# Patient Record
Sex: Male | Born: 1945
Health system: Southern US, Community
[De-identification: ages and names within clinical notes are randomized; demographics above are authoritative.]

## PROBLEM LIST (undated history)

## (undated) DIAGNOSIS — Z972 Presence of dental prosthetic device (complete) (partial): Secondary | ICD-10-CM

## (undated) DIAGNOSIS — G4730 Sleep apnea, unspecified: Secondary | ICD-10-CM

## (undated) DIAGNOSIS — K2210 Ulcer of esophagus without bleeding: Secondary | ICD-10-CM

## (undated) DIAGNOSIS — E119 Type 2 diabetes mellitus without complications: Secondary | ICD-10-CM

## (undated) DIAGNOSIS — I10 Essential (primary) hypertension: Secondary | ICD-10-CM

## (undated) HISTORY — DX: Essential (primary) hypertension: I10

## (undated) HISTORY — PX: TONSILLECTOMY: SUR1361

## (undated) HISTORY — DX: Ulcer of esophagus without bleeding: K22.10

---

## 2009-02-22 ENCOUNTER — Ambulatory Visit: Payer: Self-pay | Admitting: Internal Medicine

## 2009-04-14 ENCOUNTER — Ambulatory Visit: Payer: Self-pay | Admitting: Gastroenterology

## 2009-08-05 ENCOUNTER — Inpatient Hospital Stay: Payer: Self-pay | Admitting: Internal Medicine

## 2010-08-24 ENCOUNTER — Emergency Department: Payer: Self-pay | Admitting: Emergency Medicine

## 2010-08-30 ENCOUNTER — Telehealth: Payer: Self-pay | Admitting: Internal Medicine
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
## 2010-08-31 NOTE — Telephone Encounter (Signed)
appt 09/01/10 ok per dr walker

## 2010-09-01 ENCOUNTER — Ambulatory Visit: Payer: Self-pay | Admitting: Internal Medicine

## 2010-10-13 ENCOUNTER — Ambulatory Visit (INDEPENDENT_AMBULATORY_CARE_PROVIDER_SITE_OTHER): Payer: Medicare Other | Admitting: Internal Medicine

## 2010-10-13 ENCOUNTER — Encounter: Payer: Self-pay | Admitting: Internal Medicine

## 2010-10-13 VITALS — BP 142/100 | HR 68 | Temp 98.9°F | Resp 16 | Ht 65.5 in | Wt 202.2 lb

## 2010-10-13 DIAGNOSIS — M109 Gout, unspecified: Secondary | ICD-10-CM

## 2010-10-13 DIAGNOSIS — I10 Essential (primary) hypertension: Secondary | ICD-10-CM

## 2010-10-13 DIAGNOSIS — Z23 Encounter for immunization: Secondary | ICD-10-CM

## 2010-10-13 DIAGNOSIS — M549 Dorsalgia, unspecified: Secondary | ICD-10-CM

## 2010-10-13 LAB — COMPREHENSIVE METABOLIC PANEL
BUN: 14 mg/dL (ref 6–23)
CO2: 24 mEq/L (ref 19–32)
Calcium: 9.2 mg/dL (ref 8.4–10.5)
Chloride: 106 mEq/L (ref 96–112)
Creatinine, Ser: 1.2 mg/dL (ref 0.4–1.5)
GFR: 62.87 mL/min (ref >60.00)
Glucose, Bld: 165 mg/dL — ABNORMAL HIGH (ref 70–99)
Total Bilirubin: 0.7 mg/dL (ref 0.3–1.2)

## 2010-10-13 LAB — URIC ACID: Uric Acid, Serum: 4.7 mg/dL (ref 4.0–7.8)

## 2010-10-13 MED ORDER — ALLOPURINOL 100 MG PO TABS: 100.0000 mg | ORAL_TABLET | Freq: Every day | ORAL | Status: DC

## 2010-10-13 MED ORDER — PREDNISONE 10 MG PO TABS: 10.0000 mg | ORAL_TABLET | Freq: Every day | ORAL | Status: DC
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
## 2010-10-28 ENCOUNTER — Other Ambulatory Visit: Payer: Self-pay | Admitting: Internal Medicine

## 2010-11-03 ENCOUNTER — Telehealth: Payer: Self-pay | Admitting: Internal Medicine

## 2010-11-03 NOTE — Telephone Encounter (Signed)
PT CALLED TO CHECK ON RX  FOR PREDIZONE THAT WAS PRESCRIBE THE LAST VISITHE WAS HERE. THIS  HAS NOT REACHED [facility] ([facility]) PT HAS CALLED [facility] TODAY THEY SAY THEY DO NOT HAVE RX PLEASE ADVISE PT WHEN THIS IS CALLED IN

## 2010-11-04 MED ORDER — PREDNISONE 10 MG PO TABS: 10.0000 mg | ORAL_TABLET | Freq: Every day | ORAL | Status: AC

## 2010-11-04 NOTE — Telephone Encounter (Signed)
Rx called in    Patient informed.

## 2010-11-17 ENCOUNTER — Encounter: Payer: Self-pay | Admitting: Internal Medicine

## 2010-11-17 ENCOUNTER — Ambulatory Visit (INDEPENDENT_AMBULATORY_CARE_PROVIDER_SITE_OTHER): Payer: Medicare Other | Admitting: Internal Medicine

## 2010-11-17 DIAGNOSIS — M549 Dorsalgia, unspecified: Secondary | ICD-10-CM

## 2010-11-17 DIAGNOSIS — R7989 Other specified abnormal findings of blood chemistry: Secondary | ICD-10-CM

## 2010-11-17 DIAGNOSIS — I10 Essential (primary) hypertension: Secondary | ICD-10-CM

## 2010-11-17 LAB — COMPREHENSIVE METABOLIC PANEL
AST: 33 U/L (ref 0–37)
Albumin: 4 g/dL (ref 3.5–5.2)
BUN: 17 mg/dL (ref 6–23)
Calcium: 9.2 mg/dL (ref 8.4–10.5)
Chloride: 106 mEq/L (ref 96–112)
Glucose, Bld: 142 mg/dL — ABNORMAL HIGH (ref 70–99)
Potassium: 3.6 mEq/L (ref 3.5–5.1)

## 2010-11-17 NOTE — Progress Notes (Signed)
Subjective:    Patient ID: Shane Jones, male    DOB: Feb 09, 1945, 65 y.o.   MRN: [id number]  HPI 65YO male with HTN, gout, chronic low back pain presents for follow up. Reports BP well controlled on current meds. BP typically 130/80 or less at home. No chest pain, palpitations, headache, visual changes. Pt reports back pain improved with massage therapy. Has been much more active, started new job. Reports using medication on occasion for back pain, which was prescribed by [facility], but cannot remember name of med. No LE weakness, loss continence bowel or bladder.  Outpatient Encounter Prescriptions as of 11/17/2010  Medication Sig Dispense Refill  . allopurinol (ZYLOPRIM) 100 MG tablet Take 1 tablet (100 mg total) by mouth daily.  30 tablet  3  . amLODipine (NORVASC) 10 MG tablet Take 1 tablet by mouth Daily.      Marland Kitchen losartan (COZAAR) 100 MG tablet Take 1 tablet by mouth Daily.      . metoprolol (TOPROL-XL) 100 MG 24 hr tablet Take 100 mg by mouth daily.        . predniSONE (DELTASONE) 10 MG tablet Take 10 mg by mouth daily. Use only for severe gout attack         Review of Systems  Constitutional: Negative for fever, chills, activity change, appetite change, fatigue and unexpected weight change.  Eyes: Negative for visual disturbance.  Respiratory: Negative for cough and shortness of breath.   Cardiovascular: Negative for chest pain, palpitations and leg swelling.  Gastrointestinal: Negative for abdominal pain and abdominal distention.  Genitourinary: Negative for dysuria, urgency and difficulty urinating.  Musculoskeletal: Positive for myalgias and back pain. Negative for arthralgias and gait problem.  Skin: Negative for color change and rash.  Hematological: Negative for adenopathy.  Psychiatric/Behavioral: Negative for sleep disturbance and dysphoric mood. The patient is not nervous/anxious.    BP 138/80  Pulse 66  Temp(Src) 98.7 F (37.1 C) (Oral)  Wt 203 lb (92.08 kg)  SpO2 97%       Objective:   Physical Exam  Constitutional: He is oriented to person, place, and time. He appears well-developed and well-nourished. No distress.  HENT:  Head: Normocephalic and atraumatic.  Right Ear: External ear normal.  Left Ear: External ear normal.  Nose: Nose normal.  Mouth/Throat: Oropharynx is clear and moist. No oropharyngeal exudate.  Eyes: Conjunctivae and EOM are normal. Pupils are equal, round, and reactive to light. Right eye exhibits no discharge. Left eye exhibits no discharge. No scleral icterus.  Neck: Normal range of motion. Neck supple. No tracheal deviation present. No thyromegaly present.  Cardiovascular: Normal rate, regular rhythm and normal heart sounds.  Exam reveals no gallop and no friction rub.   No murmur heard. Pulmonary/Chest: Effort normal and breath sounds normal. No respiratory distress. He has no wheezes. He has no rales. He exhibits no tenderness.  Musculoskeletal: Normal range of motion. He exhibits edema (+2 to mid lower leg).  Lymphadenopathy:    He has no cervical adenopathy.  Neurological: He is alert and oriented to person, place, and time. No cranial nerve deficit. Coordination normal.  Skin: Skin is warm and dry. No rash noted. He is not diaphoretic. No erythema. No pallor.  Psychiatric: He has a normal mood and affect. His behavior is normal. Judgment and thought content normal.          Assessment & Plan:  1. Hypertension - BP well controlled on current meds. Will continue. Follow up 3 months.  2. Chronic  back pain - Marked improvement with massage therapy.  Will continue.  3. Elevated LFTs - Noted on labs 1 month ago. Will recheck today.

## 2011-01-26 ENCOUNTER — Ambulatory Visit (INDEPENDENT_AMBULATORY_CARE_PROVIDER_SITE_OTHER): Payer: Medicare Other | Admitting: Internal Medicine

## 2011-01-26 ENCOUNTER — Encounter: Payer: Self-pay | Admitting: Internal Medicine

## 2011-01-26 VITALS — BP 120/70 | HR 71 | Temp 97.8°F | Ht 66.0 in | Wt 193.0 lb

## 2011-01-26 DIAGNOSIS — M109 Gout, unspecified: Secondary | ICD-10-CM

## 2011-01-26 MED ORDER — PREDNISONE (PAK) 10 MG PO TABS: ORAL_TABLET | ORAL | Status: AC

## 2011-01-26 MED ORDER — PREDNISONE (PAK) 10 MG PO TABS: ORAL_TABLET | ORAL | Status: DC

## 2011-01-26 MED ORDER — HYDROCODONE-ACETAMINOPHEN 5-500 MG PO TABS: 1.0000 | ORAL_TABLET | Freq: Three times a day (TID) | ORAL | Status: DC | PRN

## 2011-01-26 NOTE — Progress Notes (Signed)
  Subjective:    Patient ID: Shane Jones, male    DOB: November 13, 1945, 66 y.o.   MRN: [id number]  HPI 66 year old male with history of gouty arthropathy presents for an acute visit complaining of a one-week history of worsening pain over his left anterior knee. He has a history of extensive gouty arthropathy in both of his knees. Last year, he required hospitalization for pain control and management of large knee effusion. He has been taking allopurinol since his last hospitalization in his symptoms have been well-controlled. When his pain began to increase one week ago, he started taking prednisone. He has been taking it intermittently 1-2 tablets per day. He reports some improvement with this, however he continues to have pain in the evenings which is limiting his ability to sleep. He denies any fever or chills. He denies any other joint pain.  Outpatient Encounter Prescriptions as of 01/26/2011  Medication Sig Dispense Refill  . allopurinol (ZYLOPRIM) 100 MG tablet Take 1 tablet (100 mg total) by mouth daily.  30 tablet  3  . amLODipine (NORVASC) 10 MG tablet Take 1 tablet by mouth Daily.      Marland Kitchen losartan (COZAAR) 100 MG tablet Take 1 tablet by mouth Daily.      . metoprolol (TOPROL-XL) 100 MG 24 hr tablet Take 100 mg by mouth daily.        . predniSONE (DELTASONE) 10 MG tablet Take 10 mg by mouth daily. Use only for severe gout attack       . HYDROcodone-acetaminophen (VICODIN) 5-500 MG per tablet Take 1 tablet by mouth every 8 (eight) hours as needed for pain.  20 tablet  0  . predniSONE (STERAPRED UNI-PAK) 10 MG tablet Take 60mg  day 1 then taper by 10mg  daily  21 tablet  0  . DISCONTD: predniSONE (STERAPRED UNI-PAK) 10 MG tablet Take 60mg  day 1 then taper by 10mg  daily  21 tablet  0    Review of Systems  Constitutional: Negative for fever, chills and diaphoresis.  Respiratory: Negative for shortness of breath.   Cardiovascular: Positive for leg swelling. Negative for chest pain.  Musculoskeletal:  Positive for myalgias, joint swelling and arthralgias.  Skin: Positive for color change.   BP 120/70  Pulse 71  Temp(Src) 97.8 F (36.6 C) (Oral)  Ht 5\' 6"  (1.676 m)  Wt 193 lb (87.544 kg)  BMI 31.15 kg/m2  SpO2 95%     Objective:   Physical Exam  Constitutional: He appears well-developed and well-nourished. No distress.  HENT:  Head: Normocephalic and atraumatic.  Eyes: EOM are normal.  Neck: Normal range of motion. Neck supple.  Pulmonary/Chest: Effort normal.  Musculoskeletal:       Left knee: He exhibits swelling, erythema and bony tenderness.  Skin: He is not diaphoretic.          Assessment & Plan:

## 2011-01-26 NOTE — Assessment & Plan Note (Signed)
Patient with gout flare. He is on allopurinol. He has been using prednisone intermittently. We'll have him start a prednisone taper starting at 60 mg today and then tapering by 10 mg daily until gone. He will use hydrocodone as needed for severe pain. He will return to clinic in 2 weeks or sooner if symptoms are not improving.

## 2011-01-30 ENCOUNTER — Telehealth: Payer: Self-pay | Admitting: *Deleted

## 2011-01-30 NOTE — Telephone Encounter (Signed)
FYI - Pt left VM, just wanted you to know that he is doing much better

## 2011-02-08 ENCOUNTER — Telehealth: Payer: Self-pay | Admitting: *Deleted

## 2011-02-08 DIAGNOSIS — M109 Gout, unspecified: Secondary | ICD-10-CM

## 2011-02-08 NOTE — Telephone Encounter (Signed)
Patient requesting RF of hydroco/apap 5/500 for gout pain in knee.

## 2011-02-08 NOTE — Telephone Encounter (Signed)
Fine to refill 

## 2011-02-09 ENCOUNTER — Encounter: Payer: Self-pay | Admitting: Internal Medicine

## 2011-02-09 MED ORDER — HYDROCODONE-ACETAMINOPHEN 5-500 MG PO TABS: 1.0000 | ORAL_TABLET | Freq: Three times a day (TID) | ORAL | Status: DC | PRN

## 2011-02-09 NOTE — Telephone Encounter (Signed)
Called in, Patient informed  

## 2011-02-13 ENCOUNTER — Ambulatory Visit: Payer: Medicare Other | Admitting: Internal Medicine

## 2011-02-17 ENCOUNTER — Encounter: Payer: Self-pay | Admitting: Internal Medicine

## 2011-02-17 ENCOUNTER — Ambulatory Visit (INDEPENDENT_AMBULATORY_CARE_PROVIDER_SITE_OTHER): Payer: Medicare Other | Admitting: Internal Medicine

## 2011-02-17 DIAGNOSIS — E119 Type 2 diabetes mellitus without complications: Secondary | ICD-10-CM

## 2011-02-17 DIAGNOSIS — G629 Polyneuropathy, unspecified: Secondary | ICD-10-CM | POA: Insufficient documentation

## 2011-02-17 DIAGNOSIS — E039 Hypothyroidism, unspecified: Secondary | ICD-10-CM

## 2011-02-17 DIAGNOSIS — I10 Essential (primary) hypertension: Secondary | ICD-10-CM | POA: Diagnosis not present

## 2011-02-17 DIAGNOSIS — G589 Mononeuropathy, unspecified: Secondary | ICD-10-CM | POA: Diagnosis not present

## 2011-02-17 DIAGNOSIS — D510 Vitamin B12 deficiency anemia due to intrinsic factor deficiency: Secondary | ICD-10-CM | POA: Diagnosis not present

## 2011-02-17 DIAGNOSIS — M109 Gout, unspecified: Secondary | ICD-10-CM

## 2011-02-17 LAB — COMPREHENSIVE METABOLIC PANEL
ALT: 24 U/L (ref 0–53)
AST: 20 U/L (ref 0–37)
Calcium: 9.2 mg/dL (ref 8.4–10.5)
Chloride: 99 mEq/L (ref 96–112)
Creatinine, Ser: 1.1 mg/dL (ref 0.4–1.5)
Sodium: 135 mEq/L (ref 135–145)

## 2011-02-17 LAB — TSH: TSH: 0.84 u[IU]/mL (ref 0.35–5.50)

## 2011-02-17 MED ORDER — METOPROLOL SUCCINATE ER 100 MG PO TB24: 100.0000 mg | ORAL_TABLET | Freq: Every day | ORAL | Status: DC

## 2011-02-17 MED ORDER — ALLOPURINOL 100 MG PO TABS: 100.0000 mg | ORAL_TABLET | Freq: Every day | ORAL | Status: DC

## 2011-02-17 MED ORDER — AMLODIPINE BESYLATE 10 MG PO TABS: 10.0000 mg | ORAL_TABLET | Freq: Every day | ORAL | Status: DC

## 2011-02-17 NOTE — Progress Notes (Signed)
Subjective:    Patient ID: Shane Jones, male    DOB: 1945/12/20, 66 y.o.   MRN: [id number]  HPI 66 year old male with history of hypertension, gout presents for followup. He recently had a gout flare and completed a taper pack of prednisone. He reports resolution of his symptoms including joint pain after taking prednisone. He is currently taking allopurinol alone. He reports no further symptoms such as pain, redness, or joint stiffness.  In regards to his hypertension, he reports full compliance with his medications. He denies any chest pain, shortness of breath, palpitations. He has not regularly checked his blood pressure.  He is concerned today about some intermittent burning in his feet. Currently, he has not had any symptoms. However, when he was using prednisone he noted burning pain in both of his feet extending up his legs. He has never had a wound on his legs. He does not have a diagnosis of diabetes, but has had some mildly elevated sugars in the past. He does not have a history of thyroid dysfunction or B12 deficiency. He denies any weakness in his legs. He denies any low back pain.  Outpatient Encounter Prescriptions as of 02/17/2011  Medication Sig Dispense Refill  . allopurinol (ZYLOPRIM) 100 MG tablet Take 1 tablet (100 mg total) by mouth daily.  90 tablet  3  . amLODipine (NORVASC) 10 MG tablet Take 1 tablet (10 mg total) by mouth daily.  90 tablet  3  . HYDROcodone-acetaminophen (VICODIN) 5-500 MG per tablet Take 1 tablet by mouth every 8 (eight) hours as needed for pain.  20 tablet  0  . losartan (COZAAR) 100 MG tablet Take 1 tablet by mouth Daily.      . metoprolol succinate (TOPROL-XL) 100 MG 24 hr tablet Take 1 tablet (100 mg total) by mouth daily.  90 tablet  3  . predniSONE (DELTASONE) 10 MG tablet Take 10 mg by mouth daily. Use only for severe gout attack       . DISCONTD: allopurinol (ZYLOPRIM) 100 MG tablet Take 1 tablet (100 mg total) by mouth daily.  30 tablet  3  .  DISCONTD: allopurinol (ZYLOPRIM) 100 MG tablet Take 1 tablet (100 mg total) by mouth daily.  90 tablet  3  . DISCONTD: amLODipine (NORVASC) 10 MG tablet Take 1 tablet by mouth Daily.      Marland Kitchen DISCONTD: metoprolol (TOPROL-XL) 100 MG 24 hr tablet Take 100 mg by mouth daily.          Review of Systems  Constitutional: Negative for fever, chills, activity change, appetite change, fatigue and unexpected weight change.  Eyes: Negative for visual disturbance.  Respiratory: Negative for cough and shortness of breath.   Cardiovascular: Negative for chest pain, palpitations and leg swelling.  Gastrointestinal: Negative for abdominal pain and abdominal distention.  Genitourinary: Negative for dysuria, urgency and difficulty urinating.  Musculoskeletal: Positive for myalgias. Negative for joint swelling, arthralgias and gait problem.  Skin: Negative for color change and rash.  Neurological: Positive for numbness.  Hematological: Negative for adenopathy.  Psychiatric/Behavioral: Negative for sleep disturbance and dysphoric mood. The patient is not nervous/anxious.    BP 130/82  Pulse 88  Temp(Src) 98 F (36.7 C) (Oral)  Resp 12  Wt 188 lb (85.276 kg)  SpO2 98%     Objective:   Physical Exam  Constitutional: He is oriented to person, place, and time. He appears well-developed and well-nourished. No distress.  HENT:  Head: Normocephalic and atraumatic.  Right Ear: External ear  normal.  Left Ear: External ear normal.  Nose: Nose normal.  Mouth/Throat: Oropharynx is clear and moist. No oropharyngeal exudate.  Eyes: Conjunctivae and EOM are normal. Pupils are equal, round, and reactive to light. Right eye exhibits no discharge. Left eye exhibits no discharge. No scleral icterus.  Neck: Normal range of motion. Neck supple. No tracheal deviation present. No thyromegaly present.  Cardiovascular: Normal rate, regular rhythm and normal heart sounds.  Exam reveals no gallop and no friction rub.   No  murmur heard. Pulmonary/Chest: Effort normal and breath sounds normal. No respiratory distress. He has no wheezes. He has no rales. He exhibits no tenderness.  Musculoskeletal: Normal range of motion. He exhibits no edema.  Lymphadenopathy:    He has no cervical adenopathy.  Neurological: He is alert and oriented to person, place, and time. No cranial nerve deficit. Coordination normal.  Skin: Skin is warm and dry. No rash noted. He is not diaphoretic. No erythema. No pallor.  Psychiatric: He has a normal mood and affect. His behavior is normal. Judgment and thought content normal.          Assessment & Plan:

## 2011-02-17 NOTE — Assessment & Plan Note (Signed)
BP well controlled on current meds. Will continue. Renal function with labs today. Follow up in 6 months.

## 2011-02-17 NOTE — Assessment & Plan Note (Signed)
Currently asymptomatic. On allopurinol, will continue.  Pt will call if symptoms recur.

## 2011-02-17 NOTE — Assessment & Plan Note (Signed)
Currently asymptomatic, but recently with symptoms of burning lower extremities.  Will check BG, TSH, B12 with labs. Follow up prn and 6 months.

## 2011-02-20 ENCOUNTER — Ambulatory Visit (INDEPENDENT_AMBULATORY_CARE_PROVIDER_SITE_OTHER): Payer: Medicare Other | Admitting: Internal Medicine

## 2011-02-20 ENCOUNTER — Other Ambulatory Visit: Payer: Self-pay | Admitting: *Deleted

## 2011-02-20 ENCOUNTER — Encounter: Payer: Self-pay | Admitting: Internal Medicine

## 2011-02-20 VITALS — BP 122/74 | HR 67 | Temp 98.3°F | Ht 66.0 in | Wt 187.0 lb

## 2011-02-20 DIAGNOSIS — E1140 Type 2 diabetes mellitus with diabetic neuropathy, unspecified: Secondary | ICD-10-CM | POA: Insufficient documentation

## 2011-02-20 DIAGNOSIS — E1165 Type 2 diabetes mellitus with hyperglycemia: Secondary | ICD-10-CM

## 2011-02-20 DIAGNOSIS — E119 Type 2 diabetes mellitus without complications: Secondary | ICD-10-CM | POA: Insufficient documentation

## 2011-02-20 MED ORDER — GLIPIZIDE-METFORMIN HCL 2.5-500 MG PO TABS: 1.0000 | ORAL_TABLET | Freq: Two times a day (BID) | ORAL | Status: DC

## 2011-02-20 MED ORDER — GLUCOSE BLOOD VI STRP: ORAL_STRIP | Status: AC

## 2011-02-20 NOTE — Progress Notes (Signed)
  Subjective:    Patient ID: Shane Jones, male    DOB: 1945-03-29, 66 y.o.   MRN: [id number]  HPI 66 year old male with history of gout, pre-diabetes, and hypertension presents for followup. He was recently seen and had lab work including hemoglobin A1c which was elevated at 12%. He presents today to followup on these labs. He has not been checking his blood sugars. He has been using prednisone on a regular basis for the last several months secondary to gout flare. He denies any polyuria or polyphasia. He denies any wounds. He has never taken medications for diabetes.  Outpatient Encounter Prescriptions as of 02/20/2011  Medication Sig Dispense Refill  . allopurinol (ZYLOPRIM) 100 MG tablet Take 1 tablet (100 mg total) by mouth daily.  90 tablet  3  . amLODipine (NORVASC) 10 MG tablet Take 1 tablet (10 mg total) by mouth daily.  90 tablet  3  . HYDROcodone-acetaminophen (VICODIN) 5-500 MG per tablet Take 1 tablet by mouth every 8 (eight) hours as needed.      Marland Kitchen losartan (COZAAR) 100 MG tablet Take 1 tablet by mouth Daily.      . metoprolol succinate (TOPROL-XL) 100 MG 24 hr tablet Take 1 tablet (100 mg total) by mouth daily.  90 tablet  3  . predniSONE (DELTASONE) 10 MG tablet Take 10 mg by mouth daily. Use only for severe gout attack       . DISCONTD: HYDROcodone-acetaminophen (VICODIN) 5-500 MG per tablet Take 1 tablet by mouth every 8 (eight) hours as needed for pain.  20 tablet  0  . glipiZIDE-metformin (METAGLIP) 2.5-500 MG per tablet Take 1 tablet by mouth 2 (two) times daily before a meal.  60 tablet  1    Review of Systems  Constitutional: Negative for fever, chills, activity change, appetite change, fatigue and unexpected weight change.  Eyes: Negative for visual disturbance.  Respiratory: Negative for cough and shortness of breath.   Cardiovascular: Negative for chest pain, palpitations and leg swelling.  Gastrointestinal: Negative for abdominal pain and abdominal distention.    Genitourinary: Negative for dysuria, urgency and difficulty urinating.  Musculoskeletal: Positive for myalgias and arthralgias. Negative for gait problem.  Skin: Negative for color change and rash.  Hematological: Negative for adenopathy.  Psychiatric/Behavioral: Negative for sleep disturbance and dysphoric mood. The patient is not nervous/anxious.    BP 122/74  Pulse 67  Temp(Src) 98.3 F (36.8 C) (Oral)  Ht 5\' 6"  (1.676 m)  Wt 187 lb (84.823 kg)  BMI 30.18 kg/m2  SpO2 98%     Objective:   Physical Exam  Constitutional: He is oriented to person, place, and time. He appears well-developed and well-nourished. No distress.  HENT:  Head: Normocephalic and atraumatic.  Eyes: EOM are normal. Pupils are equal, round, and reactive to light.  Neck: Normal range of motion.  Pulmonary/Chest: Effort normal.  Musculoskeletal: Normal range of motion.  Neurological: He is alert and oriented to person, place, and time.  Skin: Skin is warm and dry. No rash noted. He is not diaphoretic. No erythema. No pallor.  Psychiatric: He has a normal mood and affect. His behavior is normal. Judgment and thought content normal.          Assessment & Plan:

## 2011-02-20 NOTE — Assessment & Plan Note (Signed)
A1c 12% on recent labs. Blood sugar likely exacerbated secondary to recent use of prednisone. Discussed starting insulin, but patient would prefer to try oral medications first. Will start glipizide metformin. Patient will record sugars twice daily. He will call if any sugars less than 80 or greater than 250. He will followup in 2 weeks.

## 2011-02-20 NOTE — Patient Instructions (Addendum)
Start Glipizide-Metformin 2.5-500mg  daily.   Check blood sugar every day.  Goal blood sugar 80-120 fasting, or less than 200 if taken after a meal.  Limit intake of processed carbohydrates such as canned fruits, breads, pasta.  Follow up 2 weeks.  IF ANY blood sugars less than 80, please call our office.

## 2011-03-06 ENCOUNTER — Ambulatory Visit (INDEPENDENT_AMBULATORY_CARE_PROVIDER_SITE_OTHER): Payer: Medicare Other | Admitting: Internal Medicine

## 2011-03-06 ENCOUNTER — Encounter: Payer: Self-pay | Admitting: Internal Medicine

## 2011-03-06 VITALS — BP 132/80 | HR 76 | Temp 98.4°F | Ht 65.5 in | Wt 194.0 lb

## 2011-03-06 DIAGNOSIS — G629 Polyneuropathy, unspecified: Secondary | ICD-10-CM

## 2011-03-06 DIAGNOSIS — G589 Mononeuropathy, unspecified: Secondary | ICD-10-CM

## 2011-03-06 DIAGNOSIS — I10 Essential (primary) hypertension: Secondary | ICD-10-CM | POA: Diagnosis not present

## 2011-03-06 DIAGNOSIS — E1165 Type 2 diabetes mellitus with hyperglycemia: Secondary | ICD-10-CM

## 2011-03-06 MED ORDER — GABAPENTIN 100 MG PO CAPS: 100.0000 mg | ORAL_CAPSULE | Freq: Three times a day (TID) | ORAL | Status: DC

## 2011-03-06 NOTE — Assessment & Plan Note (Signed)
Likely secondary to elevated blood sugar. Will start Neurontin 100 mg at bedtime. He will followup in 3 months.

## 2011-03-06 NOTE — Assessment & Plan Note (Signed)
Per patient report, blood sugar control is improved. Will plan to continue glipizide metformin. Goal fasting sugars 100-150. Patient will have repeat hemoglobin A1c checked in 2 months. He will followup in 3 months.

## 2011-03-06 NOTE — Progress Notes (Signed)
Subjective:    Patient ID: Shane Jones, male    DOB: [birth year], 66 y.o.   MRN: [id number]  HPI 66 year old male with history of hypertension, gout, and diabetes mellitus presents for followup. He was recently started on glipizide metformin. He reports that blood sugars are improved with most fasting blood sugars near 140. He has not had any low blood sugars less than 80. He has not had any elevated sugars greater than 250 since starting medication. He continues to have some burning in both of his feet which bothers him most at night. He is not currently taking any medication for this. He denies any new wounds on his legs. He denies any weakness in his legs.  In regards to his hypertension, he reports full compliance with this medication. He denies any chest pain, shortness of breath, palpitations.  Outpatient Encounter Prescriptions as of 03/06/2011  Medication Sig Dispense Refill  . allopurinol (ZYLOPRIM) 100 MG tablet Take 1 tablet (100 mg total) by mouth daily.  90 tablet  3  . amLODipine (NORVASC) 10 MG tablet Take 1 tablet (10 mg total) by mouth daily.  90 tablet  3  . glipiZIDE-metformin (METAGLIP) 2.5-500 MG per tablet Take 1 tablet by mouth 2 (two) times daily before a meal.  60 tablet  1  . glucose blood (TRUETRACK TEST) test strip Use 2 to 3 times daily DX: 250.02  100 each  12  . losartan (COZAAR) 100 MG tablet Take 1 tablet by mouth Daily.      . metoprolol succinate (TOPROL-XL) 100 MG 24 hr tablet Take 1 tablet (100 mg total) by mouth daily.  90 tablet  3  . DISCONTD: predniSONE (DELTASONE) 10 MG tablet Take 10 mg by mouth daily. Use only for severe gout attack       . gabapentin (NEURONTIN) 100 MG capsule Take 1 capsule (100 mg total) by mouth 3 (three) times daily.  30 capsule  3    Review of Systems  Constitutional: Negative for fever, chills, activity change, appetite change, fatigue and unexpected weight change.  Eyes: Negative for visual disturbance.  Respiratory: Negative  for cough and shortness of breath.   Cardiovascular: Negative for chest pain, palpitations and leg swelling.  Gastrointestinal: Negative for abdominal pain and abdominal distention.  Genitourinary: Negative for dysuria, urgency and difficulty urinating.  Musculoskeletal: Positive for myalgias. Negative for arthralgias and gait problem.  Skin: Negative for color change and rash.  Hematological: Negative for adenopathy.  Psychiatric/Behavioral: Negative for sleep disturbance and dysphoric mood. The patient is not nervous/anxious.    BP 132/80  Pulse 76  Temp(Src) 98.4 F (36.9 C) (Oral)  Ht 5' 5.5" (1.664 m)  Wt 194 lb (87.998 kg)  BMI 31.79 kg/m2  SpO2 97%     Objective:   Physical Exam  Constitutional: He is oriented to person, place, and time. He appears well-developed and well-nourished. No distress.  HENT:  Head: Normocephalic and atraumatic.  Right Ear: External ear normal.  Left Ear: External ear normal.  Nose: Nose normal.  Mouth/Throat: Oropharynx is clear and moist. No oropharyngeal exudate.  Eyes: Conjunctivae and EOM are normal. Pupils are equal, round, and reactive to light. Right eye exhibits no discharge. Left eye exhibits no discharge. No scleral icterus.  Neck: Normal range of motion. Neck supple. No tracheal deviation present. No thyromegaly present.  Cardiovascular: Normal rate, regular rhythm and normal heart sounds.  Exam reveals no gallop and no friction rub.   No murmur heard. Pulmonary/Chest: Effort normal  and breath sounds normal. No respiratory distress. He has no wheezes. He has no rales. He exhibits no tenderness.  Musculoskeletal: Normal range of motion. He exhibits no edema.  Lymphadenopathy:    He has no cervical adenopathy.  Neurological: He is alert and oriented to person, place, and time. No cranial nerve deficit. Coordination normal.  Skin: Skin is warm and dry. No rash noted. He is not diaphoretic. No erythema. No pallor.  Psychiatric: He has a  normal mood and affect. His behavior is normal. Judgment and thought content normal.          Assessment & Plan:

## 2011-03-06 NOTE — Assessment & Plan Note (Signed)
Blood pressure is well-controlled on current medications. Will continue. Patient will followup in 3 months.

## 2011-05-08 ENCOUNTER — Ambulatory Visit (INDEPENDENT_AMBULATORY_CARE_PROVIDER_SITE_OTHER): Payer: Medicare Other | Admitting: Internal Medicine

## 2011-05-08 ENCOUNTER — Encounter: Payer: Self-pay | Admitting: Internal Medicine

## 2011-05-08 VITALS — BP 132/82 | HR 69 | Temp 97.9°F | Resp 16 | Wt 201.8 lb

## 2011-05-08 DIAGNOSIS — G589 Mononeuropathy, unspecified: Secondary | ICD-10-CM | POA: Diagnosis not present

## 2011-05-08 DIAGNOSIS — I10 Essential (primary) hypertension: Secondary | ICD-10-CM | POA: Diagnosis not present

## 2011-05-08 DIAGNOSIS — G629 Polyneuropathy, unspecified: Secondary | ICD-10-CM

## 2011-05-08 DIAGNOSIS — E1165 Type 2 diabetes mellitus with hyperglycemia: Secondary | ICD-10-CM

## 2011-05-08 LAB — COMPREHENSIVE METABOLIC PANEL
ALT: 28 U/L (ref 0–53)
AST: 25 U/L (ref 0–37)
Albumin: 4.3 g/dL (ref 3.5–5.2)
Alkaline Phosphatase: 47 U/L (ref 39–117)
BUN: 13 mg/dL (ref 6–23)
CO2: 29 mEq/L (ref 19–32)
Calcium: 9.1 mg/dL (ref 8.4–10.5)
Chloride: 104 mEq/L (ref 96–112)
Creatinine, Ser: 1.1 mg/dL (ref 0.4–1.5)
GFR: 69.27 mL/min (ref >60.00)
Glucose, Bld: 122 mg/dL — ABNORMAL HIGH (ref 70–99)
Potassium: 3.3 mEq/L — ABNORMAL LOW (ref 3.5–5.1)
Sodium: 146 mEq/L — ABNORMAL HIGH (ref 135–145)
Total Bilirubin: 0.8 mg/dL (ref 0.3–1.2)
Total Protein: 7.4 g/dL (ref 6.0–8.3)

## 2011-05-08 LAB — HEMOGLOBIN A1C: Hgb A1c MFr Bld: 5.9 % (ref 4.6–6.5)

## 2011-05-08 MED ORDER — GABAPENTIN 300 MG PO CAPS: 300.0000 mg | ORAL_CAPSULE | Freq: Three times a day (TID) | ORAL | Status: DC

## 2011-05-08 MED ORDER — GLIPIZIDE-METFORMIN HCL 2.5-500 MG PO TABS: 1.0000 | ORAL_TABLET | Freq: Every day | ORAL | Status: DC

## 2011-05-08 NOTE — Assessment & Plan Note (Signed)
Blood pressure well-controlled today. We'll continue current medications. Will check renal function with labs today.

## 2011-05-08 NOTE — Progress Notes (Signed)
Subjective:    Patient ID: Shane Jones, male    DOB: Nov 02, 1945, 66 y.o.   MRN: [id number]  HPI 66 year old male with history of diabetes, gouty arthropathy, hypertension presents for followup. In regards to his diabetes, he reports blood sugars have been well controlled, typically between 70 and 120. He denies any low blood sugars less than 60 or elevated blood sugars greater than 250. He reports full compliance with his glipizide metformin.  He is concerned today about progressive neuropathy in his legs. He reports that his legs burn, particularly his feet and lower legs. This is most prominent in the evening time and keeps him from sleeping. We started Neurontin at his last visit with minimal improvement in his symptoms. He questions whether he may be helpful to titrate up the dose of this medication.  Outpatient Encounter Prescriptions as of 05/08/2011  Medication Sig Dispense Refill  . allopurinol (ZYLOPRIM) 100 MG tablet Take 1 tablet (100 mg total) by mouth daily.  90 tablet  3  . amLODipine (NORVASC) 10 MG tablet Take 1 tablet (10 mg total) by mouth daily.  90 tablet  3  . gabapentin (NEURONTIN) 300 MG capsule Take 1 capsule (300 mg total) by mouth 3 (three) times daily.  270 capsule  3  . glipiZIDE-metformin (METAGLIP) 2.5-500 MG per tablet Take 1 tablet by mouth daily.  90 tablet  1  . glucose blood (TRUETRACK TEST) test strip Use 2 to 3 times daily DX: 250.02  100 each  12  . losartan (COZAAR) 100 MG tablet Take 1 tablet by mouth Daily.      . metoprolol succinate (TOPROL-XL) 100 MG 24 hr tablet Take 1 tablet (100 mg total) by mouth daily.  90 tablet  3  . Multiple Vitamins-Minerals (CENTRUM) tablet Take 1 tablet by mouth daily.        Review of Systems  Constitutional: Negative for fever, chills, activity change, appetite change, fatigue and unexpected weight change.  Eyes: Negative for visual disturbance.  Respiratory: Negative for cough and shortness of breath.   Cardiovascular:  Negative for chest pain, palpitations and leg swelling.  Gastrointestinal: Negative for abdominal pain and abdominal distention.  Genitourinary: Negative for dysuria, urgency and difficulty urinating.  Musculoskeletal: Positive for myalgias, joint swelling and arthralgias. Negative for gait problem.  Skin: Negative for color change and rash.  Neurological: Negative for weakness and numbness.  Hematological: Negative for adenopathy.  Psychiatric/Behavioral: Negative for sleep disturbance and dysphoric mood. The patient is not nervous/anxious.    BP 132/82  Pulse 69  Temp(Src) 97.9 F (36.6 C) (Oral)  Resp 16  Wt 201 lb 12 oz (91.513 kg)  SpO2 96%     Objective:   Physical Exam  Constitutional: He is oriented to person, place, and time. He appears well-developed and well-nourished. No distress.  HENT:  Head: Normocephalic and atraumatic.  Right Ear: External ear normal.  Left Ear: External ear normal.  Nose: Nose normal.  Mouth/Throat: Oropharynx is clear and moist. No oropharyngeal exudate.  Eyes: Conjunctivae and EOM are normal. Pupils are equal, round, and reactive to light. Right eye exhibits no discharge. Left eye exhibits no discharge. No scleral icterus.  Neck: Normal range of motion. Neck supple. No tracheal deviation present. No thyromegaly present.  Cardiovascular: Normal rate, regular rhythm and normal heart sounds.  Exam reveals no gallop and no friction rub.   No murmur heard. Pulmonary/Chest: Effort normal and breath sounds normal. No respiratory distress. He has no wheezes. He has no  rales. He exhibits no tenderness.  Musculoskeletal: Normal range of motion. He exhibits no edema.  Lymphadenopathy:    He has no cervical adenopathy.  Neurological: He is alert and oriented to person, place, and time. No cranial nerve deficit. Coordination normal.  Skin: Skin is warm and dry. No rash noted. He is not diaphoretic. No erythema. No pallor.  Psychiatric: He has a normal mood  and affect. His behavior is normal. Judgment and thought content normal.          Assessment & Plan:

## 2011-05-08 NOTE — Assessment & Plan Note (Signed)
Secondary to diabetes. Symptoms are currently poorly controlled. Will try titrating up dose of Neurontin to 300 mg 3 times daily. Followup in 3 months or sooner if symptoms are persistent.

## 2011-05-08 NOTE — Assessment & Plan Note (Signed)
Patient reports good control of blood sugars. Will check A1c with labs today. We'll continue glipizide metformin. Patient is on an ACE inhibitor. He is not currently on a statin medication because of intolerance. Followup in 3 months.

## 2011-05-22 ENCOUNTER — Telehealth: Payer: Self-pay | Admitting: Internal Medicine

## 2011-05-22 NOTE — Telephone Encounter (Signed)
PT CAME IN THAT HIS [facility] HAS BEEN TRYING TO GET A HOLD OF YOU SINCE Thursday AND AGAIN.  THEY CAN REFILL HIS MEDS WITHOUT TALKING TO YOU PLEASE CALL [phone]

## 2011-05-22 NOTE — Telephone Encounter (Signed)
LMOM to inform patient that we have not received request as of yet from [facility] for Rx refill[s]; showing [2] Rxs sent to mail order pharmacy on 05.06.13.  Asked patient to call back w/clarification as to which medications are needed at this time.

## 2011-05-23 DIAGNOSIS — H2510 Age-related nuclear cataract, unspecified eye: Secondary | ICD-10-CM | POA: Diagnosis not present

## 2011-06-02 NOTE — Telephone Encounter (Signed)
Left 2nd message on VM for patient w/call back name & number to clarify if medications ordered on 05.06.13 via [facility] were needed medications of 05.20.13 request and/or if there were other Rx needs/SLS

## 2011-06-05 MED ORDER — LOSARTAN POTASSIUM 100 MG PO TABS: 100.0000 mg | ORAL_TABLET | Freq: Every day | ORAL | Status: DC

## 2011-06-05 NOTE — Telephone Encounter (Signed)
Pt called checking on his losartan rx for [facility].  Pt stated he is almost out of meds.   [facility]  [phone] Please call pt when this is called in. Pt willnot be home after 12:30 please leave message on machine

## 2011-06-05 NOTE — Telephone Encounter (Signed)
Rx called to [facility] pharmacy, patient notified via telephone.

## 2011-08-17 ENCOUNTER — Encounter: Payer: Self-pay | Admitting: Internal Medicine

## 2011-08-17 ENCOUNTER — Ambulatory Visit (INDEPENDENT_AMBULATORY_CARE_PROVIDER_SITE_OTHER): Payer: Medicare Other | Admitting: Internal Medicine

## 2011-08-17 VITALS — BP 138/80 | HR 73 | Temp 98.5°F | Ht 65.5 in | Wt 210.0 lb

## 2011-08-17 DIAGNOSIS — R609 Edema, unspecified: Secondary | ICD-10-CM | POA: Diagnosis not present

## 2011-08-17 DIAGNOSIS — G8929 Other chronic pain: Secondary | ICD-10-CM

## 2011-08-17 DIAGNOSIS — Z23 Encounter for immunization: Secondary | ICD-10-CM

## 2011-08-17 DIAGNOSIS — E1142 Type 2 diabetes mellitus with diabetic polyneuropathy: Secondary | ICD-10-CM

## 2011-08-17 DIAGNOSIS — M549 Dorsalgia, unspecified: Secondary | ICD-10-CM | POA: Diagnosis not present

## 2011-08-17 DIAGNOSIS — Z0000 Encounter for general adult medical examination without abnormal findings: Secondary | ICD-10-CM | POA: Insufficient documentation

## 2011-08-17 DIAGNOSIS — M109 Gout, unspecified: Secondary | ICD-10-CM

## 2011-08-17 DIAGNOSIS — E1149 Type 2 diabetes mellitus with other diabetic neurological complication: Secondary | ICD-10-CM

## 2011-08-17 DIAGNOSIS — E785 Hyperlipidemia, unspecified: Secondary | ICD-10-CM | POA: Diagnosis not present

## 2011-08-17 DIAGNOSIS — R600 Localized edema: Secondary | ICD-10-CM

## 2011-08-17 DIAGNOSIS — I10 Essential (primary) hypertension: Secondary | ICD-10-CM | POA: Diagnosis not present

## 2011-08-17 DIAGNOSIS — E1140 Type 2 diabetes mellitus with diabetic neuropathy, unspecified: Secondary | ICD-10-CM

## 2011-08-17 LAB — COMPREHENSIVE METABOLIC PANEL
Alkaline Phosphatase: 45 U/L (ref 39–117)
BUN: 15 mg/dL (ref 6–23)
Creatinine, Ser: 1.1 mg/dL (ref 0.4–1.5)
Glucose, Bld: 138 mg/dL — ABNORMAL HIGH (ref 70–99)
Total Bilirubin: 0.7 mg/dL (ref 0.3–1.2)

## 2011-08-17 LAB — LIPID PANEL
Cholesterol: 189 mg/dL (ref 0–200)
HDL: 37.9 mg/dL — ABNORMAL LOW (ref >39.00)
Triglycerides: 236 mg/dL — ABNORMAL HIGH (ref 0.0–149.0)
VLDL: 47.2 mg/dL — ABNORMAL HIGH (ref 0.0–40.0)

## 2011-08-17 LAB — POCT URINALYSIS DIPSTICK
Leukocytes, UA: NEGATIVE
Nitrite, UA: NEGATIVE
Protein, UA: 100
Spec Grav, UA: 1.02
Urobilinogen, UA: 0.2

## 2011-08-17 LAB — LDL CHOLESTEROL, DIRECT: Direct LDL: 115.9 mg/dL

## 2011-08-17 LAB — HEMOGLOBIN A1C: Hgb A1c MFr Bld: 5.5 % (ref 4.6–6.5)

## 2011-08-17 LAB — MICROALBUMIN / CREATININE URINE RATIO: Microalb, Ur: 49.4 mg/dL — ABNORMAL HIGH (ref 0.0–1.9)

## 2011-08-17 MED ORDER — NABUMETONE 500 MG PO TABS: 500.0000 mg | ORAL_TABLET | Freq: Two times a day (BID) | ORAL | Status: DC

## 2011-08-17 NOTE — Assessment & Plan Note (Signed)
BP well controlled on current meds. Check renal function with labs today. Follow up 3 months.

## 2011-08-17 NOTE — Assessment & Plan Note (Signed)
Symptoms well controlled on Nabumetone. Will continue.

## 2011-08-17 NOTE — Assessment & Plan Note (Signed)
Symptomatically doing well. Will continue allopurinol and check uric acid level with labs.

## 2011-08-17 NOTE — Assessment & Plan Note (Signed)
Pt reports good control of BG. Will check A1c with labs today. Continue Glipizide-Metformin. Follow up 3 months.

## 2011-08-17 NOTE — Assessment & Plan Note (Signed)
Edema BLE with superficial spider veins noted. Will set up vascular evaluation with venous dopplers to look for chronic venous insufficiency.

## 2011-08-17 NOTE — Assessment & Plan Note (Signed)
General medical exam normal today. Will check basic labs including CMP, lipids and A1c. Pneumovax given. Colonoscopy UTD. Hearing and vision normal. Follow up 3 months

## 2011-08-17 NOTE — Progress Notes (Signed)
Subjective:    Patient ID: Shane Jones, male    DOB: [birth year], 66 y.o.   MRN: [id number]  HPI 66 year old male with history of diabetes, hypertension, gout, and chronic back pain presents for DOT physical exam. He reports he is generally doing well. He reports full compliance with all his medications. He did not bring a record of blood sugars today but reports they have been well-controlled. He has not had any recent gout attacks. He does have chronic back pain for which she is followed by orthopedic surgery. He currently uses nabumetone with relief of his symptoms.  He does note some recent swelling in both of his ankles. This is most prominent when he stands for a prolonged period of time. It typically resolves overnight. He has had some small superficial veins over his ankles. He has never been evaluated for chronic venous insufficiency.  Outpatient Encounter Prescriptions as of 08/17/2011  Medication Sig Dispense Refill  . allopurinol (ZYLOPRIM) 100 MG tablet Take 1 tablet (100 mg total) by mouth daily.  90 tablet  3  . amLODipine (NORVASC) 10 MG tablet Take 1 tablet (10 mg total) by mouth daily.  90 tablet  3  . gabapentin (NEURONTIN) 300 MG capsule Take 1 capsule (300 mg total) by mouth 3 (three) times daily.  270 capsule  3  . glipiZIDE-metformin (METAGLIP) 2.5-500 MG per tablet Take 1 tablet by mouth daily.  90 tablet  1  . glucose blood (TRUETRACK TEST) test strip Use 2 to 3 times daily DX: 250.02  100 each  12  . losartan (COZAAR) 100 MG tablet Take 1 tablet (100 mg total) by mouth daily.  90 tablet  3  . metoprolol succinate (TOPROL-XL) 100 MG 24 hr tablet Take 1 tablet (100 mg total) by mouth daily.  90 tablet  3  . Multiple Vitamins-Minerals (CENTRUM) tablet Take 1 tablet by mouth daily.      . nabumetone (RELAFEN) 500 MG tablet Take 1 tablet (500 mg total) by mouth 2 (two) times daily.  180 tablet  3    Review of Systems  Constitutional: Negative for fever, chills, activity  change, appetite change, fatigue and unexpected weight change.  Eyes: Negative for visual disturbance.  Respiratory: Negative for cough and shortness of breath.   Cardiovascular: Positive for leg swelling. Negative for chest pain and palpitations.  Gastrointestinal: Negative for abdominal pain and abdominal distention.  Genitourinary: Negative for dysuria, urgency and difficulty urinating.  Musculoskeletal: Negative for arthralgias and gait problem.  Skin: Negative for color change and rash.  Hematological: Negative for adenopathy.  Psychiatric/Behavioral: Negative for disturbed wake/sleep cycle and dysphoric mood. The patient is not nervous/anxious.    BP 138/80  Pulse 73  Temp 98.5 F (36.9 C) (Oral)  Ht 5' 5.5" (1.664 m)  Wt 210 lb (95.255 kg)  BMI 34.41 kg/m2  SpO2 97%     Objective:   Physical Exam  Constitutional: He is oriented to person, place, and time. He appears well-developed and well-nourished. No distress.  HENT:  Head: Normocephalic and atraumatic.  Right Ear: External ear normal.  Left Ear: External ear normal.  Nose: Nose normal.  Mouth/Throat: Oropharynx is clear and moist. No oropharyngeal exudate.  Eyes: Conjunctivae and EOM are normal. Pupils are equal, round, and reactive to light. Right eye exhibits no discharge. Left eye exhibits no discharge. No scleral icterus.  Neck: Normal range of motion. Neck supple. No tracheal deviation present. No thyromegaly present.  Cardiovascular: Normal rate, regular rhythm and normal  heart sounds.  Exam reveals no gallop and no friction rub.   No murmur heard. Pulmonary/Chest: Effort normal and breath sounds normal. No respiratory distress. He has no wheezes. He has no rales. He exhibits no tenderness.  Abdominal: Soft. Bowel sounds are normal. He exhibits no distension and no mass. There is no tenderness. There is no guarding.  Musculoskeletal: Normal range of motion. He exhibits edema (trace ankles).  Lymphadenopathy:     He has no cervical adenopathy.  Neurological: He is alert and oriented to person, place, and time. No cranial nerve deficit. Coordination normal.  Skin: Skin is warm and dry. No rash noted. He is not diaphoretic. No erythema. No pallor.  Psychiatric: He has a normal mood and affect. His behavior is normal. Judgment and thought content normal.          Assessment & Plan:

## 2011-08-25 ENCOUNTER — Other Ambulatory Visit: Payer: Medicare Other

## 2011-08-29 ENCOUNTER — Other Ambulatory Visit (INDEPENDENT_AMBULATORY_CARE_PROVIDER_SITE_OTHER): Payer: Medicare Other

## 2011-08-29 LAB — COMPREHENSIVE METABOLIC PANEL
ALT: 31 U/L (ref 0–53)
CO2: 29 mEq/L (ref 19–32)
Creatinine, Ser: 1.2 mg/dL (ref 0.4–1.5)
GFR: 65.16 mL/min (ref >60.00)
Total Bilirubin: 0.6 mg/dL (ref 0.3–1.2)

## 2011-08-29 LAB — HEMOGLOBIN A1C: Hgb A1c MFr Bld: 5.6 % (ref 4.6–6.5)

## 2011-08-31 LAB — LIPID PANEL
HDL: 33.2 mg/dL — ABNORMAL LOW (ref >39.00)
Triglycerides: 258 mg/dL — ABNORMAL HIGH (ref 0.0–149.0)

## 2011-08-31 LAB — LDL CHOLESTEROL, DIRECT: Direct LDL: 102.4 mg/dL

## 2011-09-01 DIAGNOSIS — I10 Essential (primary) hypertension: Secondary | ICD-10-CM | POA: Diagnosis not present

## 2011-09-01 DIAGNOSIS — E119 Type 2 diabetes mellitus without complications: Secondary | ICD-10-CM | POA: Diagnosis not present

## 2011-09-01 DIAGNOSIS — M79609 Pain in unspecified limb: Secondary | ICD-10-CM | POA: Diagnosis not present

## 2011-09-01 DIAGNOSIS — M7989 Other specified soft tissue disorders: Secondary | ICD-10-CM | POA: Diagnosis not present

## 2011-09-07 ENCOUNTER — Other Ambulatory Visit: Payer: Self-pay | Admitting: *Deleted

## 2011-09-07 MED ORDER — ATORVASTATIN CALCIUM 20 MG PO TABS: 20.0000 mg | ORAL_TABLET | Freq: Every day | ORAL | Status: DC

## 2011-10-02 ENCOUNTER — Other Ambulatory Visit: Payer: Self-pay | Admitting: Internal Medicine

## 2011-10-06 DIAGNOSIS — M79609 Pain in unspecified limb: Secondary | ICD-10-CM | POA: Diagnosis not present

## 2011-10-06 DIAGNOSIS — M7989 Other specified soft tissue disorders: Secondary | ICD-10-CM | POA: Diagnosis not present

## 2011-10-28 ENCOUNTER — Other Ambulatory Visit: Payer: Self-pay | Admitting: Internal Medicine

## 2011-11-22 ENCOUNTER — Encounter: Payer: Self-pay | Admitting: Internal Medicine

## 2011-11-22 ENCOUNTER — Ambulatory Visit (INDEPENDENT_AMBULATORY_CARE_PROVIDER_SITE_OTHER): Payer: Medicare Other | Admitting: Internal Medicine

## 2011-11-22 VITALS — BP 124/82 | HR 78 | Temp 98.2°F | Resp 16 | Wt 209.2 lb

## 2011-11-22 DIAGNOSIS — E1142 Type 2 diabetes mellitus with diabetic polyneuropathy: Secondary | ICD-10-CM | POA: Diagnosis not present

## 2011-11-22 DIAGNOSIS — I10 Essential (primary) hypertension: Secondary | ICD-10-CM | POA: Diagnosis not present

## 2011-11-22 DIAGNOSIS — E785 Hyperlipidemia, unspecified: Secondary | ICD-10-CM

## 2011-11-22 DIAGNOSIS — E1165 Type 2 diabetes mellitus with hyperglycemia: Secondary | ICD-10-CM

## 2011-11-22 DIAGNOSIS — IMO0002 Reserved for concepts with insufficient information to code with codable children: Secondary | ICD-10-CM

## 2011-11-22 DIAGNOSIS — Z23 Encounter for immunization: Secondary | ICD-10-CM

## 2011-11-22 DIAGNOSIS — E1149 Type 2 diabetes mellitus with other diabetic neurological complication: Secondary | ICD-10-CM | POA: Diagnosis not present

## 2011-11-22 DIAGNOSIS — G4730 Sleep apnea, unspecified: Secondary | ICD-10-CM

## 2011-11-22 DIAGNOSIS — E1140 Type 2 diabetes mellitus with diabetic neuropathy, unspecified: Secondary | ICD-10-CM

## 2011-11-22 LAB — COMPREHENSIVE METABOLIC PANEL
CO2: 31 mEq/L (ref 19–32)
Creatinine, Ser: 1.3 mg/dL (ref 0.4–1.5)
GFR: 56.72 mL/min — ABNORMAL LOW (ref >60.00)
Glucose, Bld: 135 mg/dL — ABNORMAL HIGH (ref 70–99)
Total Bilirubin: 0.8 mg/dL (ref 0.3–1.2)

## 2011-11-22 LAB — LIPID PANEL
Cholesterol: 123 mg/dL (ref 0–200)
Total CHOL/HDL Ratio: 4
Triglycerides: 169 mg/dL — ABNORMAL HIGH (ref 0.0–149.0)

## 2011-11-22 NOTE — Progress Notes (Signed)
Subjective:    Patient ID: Shane Jones, male    DOB: Aug 23, 1945, 66 y.o.   MRN: [id number]  HPI 66 year old male with history of hypertension, diabetes, gout presents for followup. He reports he is generally feeling well. His wife comes with him today. She is concerned about recent episodes of apnea while he is sleeping. He has never been evaluated for sleep apnea. He denies daytime somnolence.  In regards to diabetes, he reports blood sugars have been well-controlled. He reports full compliance with medication. He denies any elevated blood sugars greater than 200. In regards to hypertension, he reports full compliance with medication. He denies any recent chest pain, headache, palpitations.  Outpatient Encounter Prescriptions as of 11/22/2011  Medication Sig Dispense Refill  . allopurinol (ZYLOPRIM) 100 MG tablet Take 1 tablet (100 mg total) by mouth daily.  90 tablet  3  . amLODipine (NORVASC) 10 MG tablet Take 1 tablet (10 mg total) by mouth daily.  90 tablet  3  . atorvastatin (LIPITOR) 20 MG tablet TAKE 1 TABLET EVERY DAY  90 tablet  PRN  . gabapentin (NEURONTIN) 300 MG capsule Take 1 capsule (300 mg total) by mouth 3 (three) times daily.  270 capsule  3  . glipiZIDE-metformin (METAGLIP) 2.5-500 MG per tablet TAKE 1 TABLET DAILY  90 tablet  PRN  . glucose blood (TRUETRACK TEST) test strip Use 2 to 3 times daily DX: 250.02  100 each  12  . losartan (COZAAR) 100 MG tablet Take 1 tablet (100 mg total) by mouth daily.  90 tablet  3  . metoprolol succinate (TOPROL-XL) 100 MG 24 hr tablet Take 1 tablet (100 mg total) by mouth daily.  90 tablet  3  . Multiple Vitamins-Minerals (CENTRUM) tablet Take 1 tablet by mouth daily.      . nabumetone (RELAFEN) 500 MG tablet Take 1 tablet (500 mg total) by mouth 2 (two) times daily.  180 tablet  3   BP 124/82  Pulse 78  Temp 98.2 F (36.8 C) (Oral)  Resp 16  Wt 209 lb 4 oz (94.915 kg)  Review of Systems  Constitutional: Negative for fever, chills,  activity change, appetite change, fatigue and unexpected weight change.  Eyes: Negative for visual disturbance.  Respiratory: Positive for apnea. Negative for cough and shortness of breath.   Cardiovascular: Negative for chest pain, palpitations and leg swelling.  Gastrointestinal: Negative for abdominal pain and abdominal distention.  Genitourinary: Negative for dysuria, urgency and difficulty urinating.  Musculoskeletal: Negative for arthralgias and gait problem.  Skin: Negative for color change and rash.  Hematological: Negative for adenopathy.  Psychiatric/Behavioral: Negative for sleep disturbance and dysphoric mood. The patient is not nervous/anxious.        Objective:   Physical Exam  Constitutional: He is oriented to person, place, and time. He appears well-developed and well-nourished. No distress.  HENT:  Head: Normocephalic and atraumatic.  Right Ear: External ear normal.  Left Ear: External ear normal.  Nose: Nose normal.  Mouth/Throat: Oropharynx is clear and moist. No oropharyngeal exudate.  Eyes: Conjunctivae normal and EOM are normal. Pupils are equal, round, and reactive to light. Right eye exhibits no discharge. Left eye exhibits no discharge. No scleral icterus.  Neck: Normal range of motion. Neck supple. No tracheal deviation present. No thyromegaly present.  Cardiovascular: Normal rate, regular rhythm and normal heart sounds.  Exam reveals no gallop and no friction rub.   No murmur heard. Pulmonary/Chest: Effort normal and breath sounds normal. No respiratory distress.  He has no wheezes. He has no rales. He exhibits no tenderness.  Musculoskeletal: Normal range of motion. He exhibits no edema.  Lymphadenopathy:    He has no cervical adenopathy.  Neurological: He is alert and oriented to person, place, and time. No cranial nerve deficit. Coordination normal.  Skin: Skin is warm and dry. No rash noted. He is not diaphoretic. No erythema. No pallor.  Psychiatric: He  has a normal mood and affect. His behavior is normal. Judgment and thought content normal.          Assessment & Plan:

## 2011-11-22 NOTE — Assessment & Plan Note (Signed)
Blood sugars recently well-controlled with glipizide and metformin. Will continue. Followup in 3 months.

## 2011-11-22 NOTE — Assessment & Plan Note (Signed)
Patient's wife has noted some episodes of sleep apnea and snoring. Will set up sleep study for further evaluation.

## 2011-11-22 NOTE — Assessment & Plan Note (Signed)
Blood pressure well-controlled on current medications. Will check renal function with labs today. Followup three-month

## 2011-11-22 NOTE — Assessment & Plan Note (Signed)
Cholesterol well-controlled on Lipitor. Will continue. Followup in 3 months.

## 2011-11-23 LAB — MICROALBUMIN / CREATININE URINE RATIO: Microalb Creat Ratio: 45.2 mg/g — ABNORMAL HIGH (ref 0.0–30.0)

## 2011-12-29 ENCOUNTER — Ambulatory Visit: Payer: Self-pay | Admitting: Internal Medicine

## 2011-12-29 DIAGNOSIS — G4733 Obstructive sleep apnea (adult) (pediatric): Secondary | ICD-10-CM | POA: Diagnosis not present

## 2011-12-29 DIAGNOSIS — G4710 Hypersomnia, unspecified: Secondary | ICD-10-CM | POA: Diagnosis not present

## 2011-12-29 DIAGNOSIS — R0609 Other forms of dyspnea: Secondary | ICD-10-CM | POA: Diagnosis not present

## 2011-12-29 DIAGNOSIS — G4761 Periodic limb movement disorder: Secondary | ICD-10-CM | POA: Diagnosis not present

## 2011-12-29 DIAGNOSIS — E669 Obesity, unspecified: Secondary | ICD-10-CM | POA: Diagnosis not present

## 2011-12-29 DIAGNOSIS — I10 Essential (primary) hypertension: Secondary | ICD-10-CM | POA: Diagnosis not present

## 2011-12-29 DIAGNOSIS — R0989 Other specified symptoms and signs involving the circulatory and respiratory systems: Secondary | ICD-10-CM | POA: Diagnosis not present

## 2012-01-22 ENCOUNTER — Encounter: Payer: Self-pay | Admitting: Internal Medicine

## 2012-01-24 ENCOUNTER — Ambulatory Visit: Payer: Self-pay | Admitting: Internal Medicine

## 2012-01-24 DIAGNOSIS — G4733 Obstructive sleep apnea (adult) (pediatric): Secondary | ICD-10-CM | POA: Diagnosis not present

## 2012-01-24 DIAGNOSIS — R0609 Other forms of dyspnea: Secondary | ICD-10-CM | POA: Diagnosis not present

## 2012-01-24 DIAGNOSIS — E669 Obesity, unspecified: Secondary | ICD-10-CM | POA: Diagnosis not present

## 2012-01-24 DIAGNOSIS — G4761 Periodic limb movement disorder: Secondary | ICD-10-CM | POA: Diagnosis not present

## 2012-01-24 DIAGNOSIS — G478 Other sleep disorders: Secondary | ICD-10-CM | POA: Diagnosis not present

## 2012-01-26 DIAGNOSIS — G4730 Sleep apnea, unspecified: Secondary | ICD-10-CM | POA: Diagnosis not present

## 2012-01-26 DIAGNOSIS — G4710 Hypersomnia, unspecified: Secondary | ICD-10-CM | POA: Diagnosis not present

## 2012-02-23 ENCOUNTER — Telehealth: Payer: Self-pay | Admitting: Internal Medicine

## 2012-02-23 NOTE — Telephone Encounter (Signed)
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  RN was able to do that noting it was 08/17/2011.  However, he lost his paper work.  He will bring a new set back.  However he was hoping that he may have left it there in August when he came for that visit and it may be in the office somewhere.  If there is a chance that could be true, please follow up with patient.

## 2012-02-26 NOTE — Telephone Encounter (Signed)
Spoke with patient, he lost his DOT card and need to bring another one by here for Dr. Dan Humphreys to sign. Patient is aware that Dr. Dan Humphreys be out of the office until Wed. Of this week. He will bring his card by for a signature from Dr. Dan Humphreys.

## 2012-02-26 NOTE — Telephone Encounter (Signed)
LMTCB

## 2012-02-28 ENCOUNTER — Ambulatory Visit: Payer: Medicare Other | Admitting: Internal Medicine

## 2012-03-13 ENCOUNTER — Ambulatory Visit: Payer: Medicare Other | Admitting: Adult Health

## 2012-03-15 ENCOUNTER — Ambulatory Visit: Payer: Medicare Other | Admitting: Internal Medicine

## 2012-03-15 ENCOUNTER — Ambulatory Visit (INDEPENDENT_AMBULATORY_CARE_PROVIDER_SITE_OTHER): Payer: Medicare Other | Admitting: Internal Medicine

## 2012-03-15 ENCOUNTER — Telehealth: Payer: Self-pay | Admitting: *Deleted

## 2012-03-15 ENCOUNTER — Encounter: Payer: Self-pay | Admitting: Internal Medicine

## 2012-03-15 VITALS — BP 130/92 | HR 60 | Temp 98.2°F | Wt 200.0 lb

## 2012-03-15 DIAGNOSIS — E1142 Type 2 diabetes mellitus with diabetic polyneuropathy: Secondary | ICD-10-CM

## 2012-03-15 DIAGNOSIS — I10 Essential (primary) hypertension: Secondary | ICD-10-CM

## 2012-03-15 DIAGNOSIS — G589 Mononeuropathy, unspecified: Secondary | ICD-10-CM

## 2012-03-15 DIAGNOSIS — G629 Polyneuropathy, unspecified: Secondary | ICD-10-CM

## 2012-03-15 DIAGNOSIS — E1149 Type 2 diabetes mellitus with other diabetic neurological complication: Secondary | ICD-10-CM

## 2012-03-15 DIAGNOSIS — G4730 Sleep apnea, unspecified: Secondary | ICD-10-CM | POA: Diagnosis not present

## 2012-03-15 DIAGNOSIS — E1140 Type 2 diabetes mellitus with diabetic neuropathy, unspecified: Secondary | ICD-10-CM

## 2012-03-15 LAB — COMPREHENSIVE METABOLIC PANEL
ALT: 26 U/L (ref 0–53)
Albumin: 4.4 g/dL (ref 3.5–5.2)
CO2: 28 mEq/L (ref 19–32)
Chloride: 104 mEq/L (ref 96–112)
Glucose, Bld: 127 mg/dL — ABNORMAL HIGH (ref 70–99)
Potassium: 3.8 mEq/L (ref 3.5–5.3)
Sodium: 141 mEq/L (ref 135–145)
Total Bilirubin: 0.7 mg/dL (ref 0.3–1.2)
Total Protein: 7 g/dL (ref 6.0–8.3)

## 2012-03-15 LAB — VITAMIN B12: Vitamin B-12: 727 pg/mL (ref 211–911)

## 2012-03-15 MED ORDER — GABAPENTIN 300 MG PO CAPS: 300.0000 mg | ORAL_CAPSULE | Freq: Four times a day (QID) | ORAL | Status: DC

## 2012-03-15 NOTE — Assessment & Plan Note (Signed)
Patient is compliant with CPAP machine. Symptomatically doing well with no symptoms of fatigue. We'll plan to continue.

## 2012-03-15 NOTE — Assessment & Plan Note (Signed)
Patient reports good control of blood sugars. Will check A1c with labs today. Continue glipizide metformin. Followup 3 months.

## 2012-03-15 NOTE — Assessment & Plan Note (Signed)
BP Readings from Last 3 Encounters:  03/15/12 130/92  11/22/11 124/82  08/17/11 138/80   BP well controlled on current medications. Will check renal function with labs today.

## 2012-03-15 NOTE — Telephone Encounter (Signed)
Can you please put this pt lab orders to [facility], i deleted them to change them to [facility] but it wont allow me can you try please?

## 2012-03-15 NOTE — Progress Notes (Signed)
Subjective:    Patient ID: Shane Jones, male    DOB: Nov 07, 1945, 67 y.o.   MRN: [id number]  HPI 67 year old male with history of diabetes, hypertension, hyperlipidemia, gout, sleep apnea presents for followup. He reports he has been doing well. He does not check his blood sugars but has been compliant with medication. His only concern today is persistent numbness in both of his feet. Sensation and burning in his feet is improved with use of gabapentin. He is currently taking 2 capsules, 600 mg twice daily. He denies any side effects from this medication. He also continues to have some chronic upper back pain. He reports that symptoms of upper back pain are markedly improved with massage therapy.  Outpatient Encounter Prescriptions as of 03/15/2012  Medication Sig Dispense Refill  . allopurinol (ZYLOPRIM) 100 MG tablet Take 1 tablet (100 mg total) by mouth daily.  90 tablet  3  . amLODipine (NORVASC) 10 MG tablet Take 1 tablet (10 mg total) by mouth daily.  90 tablet  3  . atorvastatin (LIPITOR) 20 MG tablet TAKE 1 TABLET EVERY DAY  90 tablet  PRN  . gabapentin (NEURONTIN) 300 MG capsule Take 1 capsule (300 mg total) by mouth 4 (four) times daily.  360 capsule  3  . glipiZIDE-metformin (METAGLIP) 2.5-500 MG per tablet TAKE 1 TABLET DAILY  90 tablet  PRN  . losartan (COZAAR) 100 MG tablet Take 1 tablet (100 mg total) by mouth daily.  90 tablet  3  . metoprolol succinate (TOPROL-XL) 100 MG 24 hr tablet Take 1 tablet (100 mg total) by mouth daily.  90 tablet  3  . Multiple Vitamins-Minerals (CENTRUM) tablet Take 1 tablet by mouth daily.      . nabumetone (RELAFEN) 500 MG tablet Take 1 tablet (500 mg total) by mouth 2 (two) times daily.  180 tablet  3  . [DISCONTINUED] gabapentin (NEURONTIN) 300 MG capsule Take 1 capsule (300 mg total) by mouth 3 (three) times daily.  270 capsule  3   No facility-administered encounter medications on file as of 03/15/2012.   BP 130/92  Pulse 60  Temp(Src) 98.2 F  (36.8 C) (Oral)  Wt 200 lb (90.719 kg)  BMI 32.76 kg/m2  SpO2 97%  Review of Systems  Constitutional: Negative for fever, chills, activity change, appetite change, fatigue and unexpected weight change.  Eyes: Negative for visual disturbance.  Respiratory: Negative for cough and shortness of breath.   Cardiovascular: Negative for chest pain, palpitations and leg swelling.  Gastrointestinal: Negative for abdominal pain and abdominal distention.  Genitourinary: Negative for dysuria, urgency and difficulty urinating.  Musculoskeletal: Negative for arthralgias and gait problem.  Skin: Negative for color change and rash.  Neurological: Positive for numbness (bilateral feet).  Hematological: Negative for adenopathy.  Psychiatric/Behavioral: Negative for sleep disturbance and dysphoric mood. The patient is not nervous/anxious.        Objective:   Physical Exam  Constitutional: He is oriented to person, place, and time. He appears well-developed and well-nourished. No distress.  HENT:  Head: Normocephalic and atraumatic.  Right Ear: External ear normal.  Left Ear: External ear normal.  Nose: Nose normal.  Mouth/Throat: Oropharynx is clear and moist. No oropharyngeal exudate.  Eyes: Conjunctivae and EOM are normal. Pupils are equal, round, and reactive to light. Right eye exhibits no discharge. Left eye exhibits no discharge. No scleral icterus.  Neck: Normal range of motion. Neck supple. No tracheal deviation present. No thyromegaly present.  Cardiovascular: Normal rate, regular rhythm and  normal heart sounds.  Exam reveals no gallop and no friction rub.   No murmur heard. Pulmonary/Chest: Effort normal and breath sounds normal. No respiratory distress. He has no wheezes. He has no rales. He exhibits no tenderness.  Musculoskeletal: Normal range of motion. He exhibits no edema.  Lymphadenopathy:    He has no cervical adenopathy.  Neurological: He is alert and oriented to person, place,  and time. No cranial nerve deficit. Coordination normal.  Skin: Skin is warm and dry. No rash noted. He is not diaphoretic. No erythema. No pallor.  Psychiatric: He has a normal mood and affect. His behavior is normal. Judgment and thought content normal.          Assessment & Plan:

## 2012-03-15 NOTE — Assessment & Plan Note (Signed)
Given her diabetes has been under excellent control, symptoms of neuropathy seem out of proportion to deceased. Will check a B12 and TSH. We'll also screen her urine heavy metals given that patient gets his water from a well. Continue Neurontin. Followup 3 months.

## 2012-03-18 ENCOUNTER — Telehealth: Payer: Self-pay | Admitting: Internal Medicine

## 2012-03-18 NOTE — Telephone Encounter (Signed)
LMTCB

## 2012-03-18 NOTE — Telephone Encounter (Signed)
Shane Jones is wanting to know his labs results and his wife's results. He is concerned that he may have E-coli also.

## 2012-03-18 NOTE — Progress Notes (Signed)
LMTCB

## 2012-03-19 ENCOUNTER — Telehealth: Payer: Self-pay | Admitting: *Deleted

## 2012-03-19 NOTE — Telephone Encounter (Signed)
E. Coli is a very common cause of urinary tract infection especially in women. It is not contagious generally, but comes from the patient's own skin/rectal bacteria. If he is symptomatic, we can check a urinalysis.

## 2012-03-19 NOTE — Telephone Encounter (Signed)
Patient would like to have his urine checked as well since his wife has been dx with E.coli in her urine. Or if you could explain how she got this?

## 2012-03-20 ENCOUNTER — Telehealth: Payer: Self-pay | Admitting: Internal Medicine

## 2012-03-20 DIAGNOSIS — G589 Mononeuropathy, unspecified: Secondary | ICD-10-CM | POA: Diagnosis not present

## 2012-03-20 NOTE — Telephone Encounter (Signed)
Patient informed and verbally agreed.  

## 2012-03-20 NOTE — Telephone Encounter (Signed)
Please refer to previous encounter for further details.

## 2012-03-20 NOTE — Telephone Encounter (Signed)
LMTCB

## 2012-03-20 NOTE — Telephone Encounter (Signed)
Already spoke with patient in reference to this, please refer to previous encounter.

## 2012-03-20 NOTE — Telephone Encounter (Signed)
Patient returning your call.

## 2012-03-21 ENCOUNTER — Telehealth: Payer: Self-pay | Admitting: Internal Medicine

## 2012-03-21 NOTE — Telephone Encounter (Signed)
Pt called this morning regarding receiving a bill related to his previous visit.  Pt states Billing told him it was due to his visit being "routine" which does not make sense to him as routine visits should be covered.  Pt states he will be bringing the bill here to the clinic tomorrow to have it taken care of as he should not have been charged.  Pt states this has happened to him before when Dr. Dan Humphreys was at [facility] and Dr. Dan Humphreys had it taken care so he says she should be able to have it taken care of again.

## 2012-03-21 NOTE — Telephone Encounter (Signed)
Please read below...

## 2012-03-21 NOTE — Telephone Encounter (Signed)
Shane Jones, can you address this?

## 2012-03-28 LAB — HEAVY METALS SCREEN, URINE
Arsenic, 24H Ur: 14 mcg/L (ref <81)
Mercury 24 Hr Urine: <2 mcg/L (ref <21)

## 2012-04-12 NOTE — Telephone Encounter (Signed)
Have you heard anything in reference to this matter?

## 2012-06-17 ENCOUNTER — Encounter: Payer: Self-pay | Admitting: Internal Medicine

## 2012-06-17 ENCOUNTER — Ambulatory Visit (INDEPENDENT_AMBULATORY_CARE_PROVIDER_SITE_OTHER): Payer: Medicare Other | Admitting: Internal Medicine

## 2012-06-17 VITALS — BP 158/98 | HR 60 | Temp 98.3°F | Wt 204.0 lb

## 2012-06-17 DIAGNOSIS — I10 Essential (primary) hypertension: Secondary | ICD-10-CM

## 2012-06-17 DIAGNOSIS — G589 Mononeuropathy, unspecified: Secondary | ICD-10-CM | POA: Diagnosis not present

## 2012-06-17 DIAGNOSIS — E1140 Type 2 diabetes mellitus with diabetic neuropathy, unspecified: Secondary | ICD-10-CM

## 2012-06-17 DIAGNOSIS — E1142 Type 2 diabetes mellitus with diabetic polyneuropathy: Secondary | ICD-10-CM

## 2012-06-17 DIAGNOSIS — E1149 Type 2 diabetes mellitus with other diabetic neurological complication: Secondary | ICD-10-CM

## 2012-06-17 DIAGNOSIS — G629 Polyneuropathy, unspecified: Secondary | ICD-10-CM

## 2012-06-17 NOTE — Progress Notes (Signed)
Subjective:    Patient ID: Shane Jones, male    DOB: [birth year], 67 y.o.   MRN: [id number]  HPI 67 year old male with history of diabetes, hypertension, hyperlipidemia presents for followup. He reports he is generally feeling well. He reports blood sugars have been well-controlled with medication. He did not bring record of blood sugars with him today. He reports compliance with medication. He denies any chest pain, shortness of breath, palpitations. He notes some lower extremity swelling which seems to improve with keeping his legs elevated. He reports that symptoms of neuropathy have been well-controlled with use of Neurontin. He denies any side effects from this medication.  Outpatient Encounter Prescriptions as of 06/17/2012  Medication Sig Dispense Refill  . allopurinol (ZYLOPRIM) 100 MG tablet Take 1 tablet (100 mg total) by mouth daily.  90 tablet  3  . amLODipine (NORVASC) 10 MG tablet Take 1 tablet (10 mg total) by mouth daily.  90 tablet  3  . atorvastatin (LIPITOR) 20 MG tablet TAKE 1 TABLET EVERY DAY  90 tablet  PRN  . gabapentin (NEURONTIN) 300 MG capsule Take 1 capsule (300 mg total) by mouth 4 (four) times daily.  360 capsule  3  . glipiZIDE-metformin (METAGLIP) 2.5-500 MG per tablet TAKE 1 TABLET DAILY  90 tablet  PRN  . losartan (COZAAR) 100 MG tablet Take 1 tablet (100 mg total) by mouth daily.  90 tablet  3  . metoprolol succinate (TOPROL-XL) 100 MG 24 hr tablet Take 1 tablet (100 mg total) by mouth daily.  90 tablet  3  . Multiple Vitamins-Minerals (CENTRUM) tablet Take 1 tablet by mouth daily.      . nabumetone (RELAFEN) 500 MG tablet Take 1 tablet (500 mg total) by mouth 2 (two) times daily.  180 tablet  3   No facility-administered encounter medications on file as of 06/17/2012.   BP 158/98  Pulse 60  Temp(Src) 98.3 F (36.8 C) (Oral)  Wt 204 lb (92.534 kg)  BMI 33.42 kg/m2  SpO2 97%  Review of Systems  Constitutional: Negative for fever, chills, activity change,  appetite change, fatigue and unexpected weight change.  Eyes: Negative for visual disturbance.  Respiratory: Negative for cough and shortness of breath.   Cardiovascular: Negative for chest pain, palpitations and leg swelling.  Gastrointestinal: Negative for abdominal pain and abdominal distention.  Genitourinary: Negative for dysuria, urgency and difficulty urinating.  Musculoskeletal: Negative for arthralgias and gait problem.  Skin: Negative for color change and rash.  Hematological: Negative for adenopathy.  Psychiatric/Behavioral: Negative for sleep disturbance and dysphoric mood. The patient is not nervous/anxious.        Objective:   Physical Exam  Constitutional: He is oriented to person, place, and time. He appears well-developed and well-nourished. No distress.  HENT:  Head: Normocephalic and atraumatic.  Right Ear: External ear normal.  Left Ear: External ear normal.  Nose: Nose normal.  Mouth/Throat: Oropharynx is clear and moist. No oropharyngeal exudate.  Eyes: Conjunctivae and EOM are normal. Pupils are equal, round, and reactive to light. Right eye exhibits no discharge. Left eye exhibits no discharge. No scleral icterus.  Neck: Normal range of motion. Neck supple. No tracheal deviation present. No thyromegaly present.  Cardiovascular: Normal rate, regular rhythm and normal heart sounds.  Exam reveals no gallop and no friction rub.   No murmur heard. Pulmonary/Chest: Effort normal and breath sounds normal. No respiratory distress. He has no wheezes. He has no rales. He exhibits no tenderness.  Musculoskeletal: Normal range of  motion. He exhibits edema (trace bilateral LE).  Lymphadenopathy:    He has no cervical adenopathy.  Neurological: He is alert and oriented to person, place, and time. No cranial nerve deficit. Coordination normal.  Skin: Skin is warm and dry. No rash noted. He is not diaphoretic. No erythema. No pallor.  Psychiatric: He has a normal mood and  affect. His behavior is normal. Judgment and thought content normal.          Assessment & Plan:

## 2012-06-17 NOTE — Assessment & Plan Note (Signed)
Will check A1c with labs today. Continue current medications. 

## 2012-06-17 NOTE — Assessment & Plan Note (Signed)
Neuropathic pain well controlled with use of gabapentin. We'll continue.

## 2012-06-17 NOTE — Assessment & Plan Note (Signed)
BP Readings from Last 3 Encounters:  06/17/12 158/98  03/15/12 130/92  11/22/11 124/82   Blood pressure slightly elevated today however patient upset about ongoing billing issues. Patient reports blood pressure has been well-controlled at home. We'll continue current medications and continue to monitor blood pressure. Patient will call if consistently greater than 140/90. Followup in 3 months.

## 2012-06-18 LAB — COMPREHENSIVE METABOLIC PANEL
Albumin: 4.3 g/dL (ref 3.5–5.2)
BUN: 16 mg/dL (ref 6–23)
CO2: 24 mEq/L (ref 19–32)
GFR: 56.14 mL/min — ABNORMAL LOW (ref >60.00)
Glucose, Bld: 120 mg/dL — ABNORMAL HIGH (ref 70–99)
Potassium: 3.8 mEq/L (ref 3.5–5.1)
Sodium: 142 mEq/L (ref 135–145)
Total Bilirubin: 0.5 mg/dL (ref 0.3–1.2)
Total Protein: 7.3 g/dL (ref 6.0–8.3)

## 2012-07-18 ENCOUNTER — Other Ambulatory Visit: Payer: Self-pay | Admitting: Internal Medicine

## 2012-07-19 NOTE — Telephone Encounter (Signed)
Eprescribed.

## 2012-07-22 ENCOUNTER — Telehealth: Payer: Self-pay | Admitting: Internal Medicine

## 2012-07-22 ENCOUNTER — Other Ambulatory Visit: Payer: Self-pay | Admitting: Internal Medicine

## 2012-07-22 MED ORDER — ALLOPURINOL 100 MG PO TABS: ORAL_TABLET | ORAL | Status: DC

## 2012-07-22 NOTE — Telephone Encounter (Signed)
Patient left a voicemail stating he needs an emergency supply of Allopurinol sent to the pharmacy. Requested a  7 day supply be sent to [facility]. 7 day supply sent to [facility] per patient request.

## 2012-07-22 NOTE — Telephone Encounter (Signed)
Noted  

## 2012-07-22 NOTE — Telephone Encounter (Signed)
Allopurinol 100 mg.  Pt took last one today.  Has ordered but will be a while until the mail order comes.  Asking for 7 day supply to be called in [address]. Pt asking to please contact him when this is done so he can pick up on his way home from work.

## 2012-07-22 NOTE — Telephone Encounter (Signed)
Pt informed this has already been completed.

## 2012-08-07 NOTE — Telephone Encounter (Signed)
I don't see where the patient owes anything.

## 2012-09-27 ENCOUNTER — Ambulatory Visit (INDEPENDENT_AMBULATORY_CARE_PROVIDER_SITE_OTHER): Payer: Medicare Other | Admitting: Internal Medicine

## 2012-09-27 ENCOUNTER — Encounter: Payer: Self-pay | Admitting: Internal Medicine

## 2012-09-27 VITALS — BP 147/83 | HR 60 | Temp 97.9°F | Ht 65.25 in | Wt 199.0 lb

## 2012-09-27 DIAGNOSIS — E1142 Type 2 diabetes mellitus with diabetic polyneuropathy: Secondary | ICD-10-CM | POA: Diagnosis not present

## 2012-09-27 DIAGNOSIS — M109 Gout, unspecified: Secondary | ICD-10-CM | POA: Diagnosis not present

## 2012-09-27 DIAGNOSIS — Z125 Encounter for screening for malignant neoplasm of prostate: Secondary | ICD-10-CM | POA: Insufficient documentation

## 2012-09-27 DIAGNOSIS — E1149 Type 2 diabetes mellitus with other diabetic neurological complication: Secondary | ICD-10-CM

## 2012-09-27 DIAGNOSIS — Z23 Encounter for immunization: Secondary | ICD-10-CM | POA: Diagnosis not present

## 2012-09-27 DIAGNOSIS — Z0000 Encounter for general adult medical examination without abnormal findings: Secondary | ICD-10-CM

## 2012-09-27 DIAGNOSIS — E785 Hyperlipidemia, unspecified: Secondary | ICD-10-CM | POA: Diagnosis not present

## 2012-09-27 DIAGNOSIS — I10 Essential (primary) hypertension: Secondary | ICD-10-CM | POA: Diagnosis not present

## 2012-09-27 DIAGNOSIS — E1140 Type 2 diabetes mellitus with diabetic neuropathy, unspecified: Secondary | ICD-10-CM

## 2012-09-27 LAB — LIPID PANEL
Cholesterol: 102 mg/dL (ref 0–200)
Triglycerides: 241 mg/dL — ABNORMAL HIGH (ref 0.0–149.0)

## 2012-09-27 LAB — MICROALBUMIN / CREATININE URINE RATIO
Creatinine,U: 55.9 mg/dL
Microalb Creat Ratio: 43.8 mg/g — ABNORMAL HIGH (ref 0.0–30.0)
Microalb, Ur: 24.5 mg/dL — ABNORMAL HIGH (ref 0.0–1.9)

## 2012-09-27 LAB — LDL CHOLESTEROL, DIRECT: Direct LDL: 43.7 mg/dL

## 2012-09-27 LAB — COMPREHENSIVE METABOLIC PANEL
AST: 30 U/L (ref 0–37)
Alkaline Phosphatase: 52 U/L (ref 39–117)
BUN: 15 mg/dL (ref 6–23)
Calcium: 9.5 mg/dL (ref 8.4–10.5)
Creatinine, Ser: 1.1 mg/dL (ref 0.4–1.5)
Total Bilirubin: 0.8 mg/dL (ref 0.3–1.2)

## 2012-09-27 LAB — URIC ACID: Uric Acid, Serum: 4.2 mg/dL (ref 4.0–7.8)

## 2012-09-27 NOTE — Assessment & Plan Note (Signed)
BP Readings from Last 3 Encounters:  09/27/12 147/83  06/17/12 158/98  03/15/12 130/92   BP generally has been well controlled. Will check renal function with labs. Continue current medications.

## 2012-09-27 NOTE — Assessment & Plan Note (Signed)
Will check uric acid with labs today

## 2012-09-27 NOTE — Progress Notes (Signed)
Subjective:    Patient ID: Shane Jones, male    DOB: 30-Dec-1945, 67 y.o.   MRN: [id number]  HPI The patient is here for annual Medicare wellness examination and management of other chronic and acute problems.   The risk factors are reflected in the social history.  The roster of all physicians providing medical care to patient - is listed in the Snapshot section of the chart.  Activities of daily living:  The patient is 100% independent in all ADLs: dressing, toileting, feeding as well as independent mobility  Home safety : The patient has smoke detectors in the home. They wear seatbelts.  Pt declines to report whether there are firearms at home. There is no violence in the home.   There is no risks for hepatitis, STDs or HIV. There is no history of blood transfusion. They have no travel history to infectious disease endemic areas of the world.  The patient has not seen their dentist in the last six month. Has dentures. They have seen their eye doctor in the last year. Previously seen at [facility]. Last visit 1 year ago. No issues with hearing loss. They do not  have excessive sun exposure. Discussed the need for sun protection: hats, long sleeves and use of sunscreen if there is significant sun exposure. No dermatologist.  Diet: the importance of a healthy diet is discussed. They do have a relatively healthy diet.  The benefits of regular aerobic exercise were discussed. Limited exercise with caring for wife. [place].  Depression screen: there are no signs or vegative symptoms of depression- irritability, change in appetite, anhedonia, sadness/tearfullness.  Cognitive assessment: the patient manages all their financial and personal affairs and is actively engaged. They could relate day,date,year and events.  The following portions of the patient's history were reviewed and updated as appropriate: allergies, current medications, past family history, past medical history,  past  surgical history, past social history  and problem list.  Visual acuity was not assessed per patient preference since he has regular follow up with ophthalmologist. Hearing and body mass index were assessed and reviewed.   During the course of the visit the patient was educated and counseled about appropriate screening and preventive services including : fall prevention , diabetes screening, nutrition counseling, colorectal cancer screening, and recommended immunizations.    Outpatient Encounter Prescriptions as of 09/27/2012  Medication Sig Dispense Refill  . allopurinol (ZYLOPRIM) 100 MG tablet TAKE 1 TABLET DAILY  7 tablet  0  . amLODipine (NORVASC) 10 MG tablet TAKE 1 TABLET DAILY  90 tablet  3  . atorvastatin (LIPITOR) 20 MG tablet TAKE 1 TABLET EVERY DAY  90 tablet  PRN  . gabapentin (NEURONTIN) 300 MG capsule Take 1 capsule (300 mg total) by mouth 4 (four) times daily.  360 capsule  3  . glipiZIDE-metformin (METAGLIP) 2.5-500 MG per tablet TAKE 1 TABLET DAILY  90 tablet  PRN  . losartan (COZAAR) 100 MG tablet TAKE 1 TABLET DAILY  90 tablet  3  . metoprolol succinate (TOPROL-XL) 100 MG 24 hr tablet TAKE 1 TABLET DAILY  90 tablet  3  . Multiple Vitamins-Minerals (CENTRUM) tablet Take 1 tablet by mouth daily.      . nabumetone (RELAFEN) 500 MG tablet TAKE 1 TABLET TWICE DAILY  180 tablet  3   No facility-administered encounter medications on file as of 09/27/2012.   BP 147/83  Pulse 60  Temp(Src) 97.9 F (36.6 C) (Oral)  Ht 5' 5.25" (1.657 m)  Wt 199 lb (90.266 kg)  BMI 32.88 kg/m2  SpO2 98%   Review of Systems  Constitutional: Negative for fever, chills, activity change, appetite change, fatigue and unexpected weight change.  Eyes: Negative for visual disturbance.  Respiratory: Negative for cough and shortness of breath.   Cardiovascular: Negative for chest pain, palpitations and leg swelling.  Gastrointestinal: Negative for abdominal pain and abdominal distention.   Genitourinary: Negative for dysuria, urgency and difficulty urinating.  Musculoskeletal: Negative for arthralgias and gait problem.  Skin: Negative for color change and rash.  Hematological: Negative for adenopathy.  Psychiatric/Behavioral: Negative for sleep disturbance and dysphoric mood. The patient is not nervous/anxious.        Objective:   Physical Exam  Constitutional: He is oriented to person, place, and time. He appears well-developed and well-nourished. No distress.  HENT:  Head: Normocephalic and atraumatic.  Right Ear: External ear normal.  Left Ear: External ear normal.  Nose: Nose normal.  Mouth/Throat: Oropharynx is clear and moist. No oropharyngeal exudate.  Eyes: Conjunctivae and EOM are normal. Pupils are equal, round, and reactive to light. Right eye exhibits no discharge. Left eye exhibits no discharge. No scleral icterus.  Neck: Normal range of motion. Neck supple. No tracheal deviation present. No thyromegaly present.  Cardiovascular: Normal rate, regular rhythm and normal heart sounds.  Exam reveals no gallop and no friction rub.   No murmur heard. Pulmonary/Chest: Effort normal and breath sounds normal. No respiratory distress. He has no wheezes. He has no rales. He exhibits no tenderness.  Abdominal: Soft. Bowel sounds are normal. He exhibits no distension and no mass. There is no tenderness. There is no rebound and no guarding.  Musculoskeletal: Normal range of motion. He exhibits no edema.  Lymphadenopathy:    He has no cervical adenopathy.  Neurological: He is alert and oriented to person, place, and time. No cranial nerve deficit. Coordination normal.  Skin: Skin is warm and dry. No rash noted. He is not diaphoretic. No erythema. No pallor.  Psychiatric: He has a normal mood and affect. His behavior is normal. Judgment and thought content normal.          Assessment & Plan:

## 2012-09-27 NOTE — Assessment & Plan Note (Signed)
General medical exam normal today. Appropriate screening performed. Patient is up-to-date on health maintenance except for influenza vaccine which was given today. Encouraged continued efforts at healthy diet and regular physical activity. Will check labs including CMP, lipids, A1c, PSA.

## 2012-09-27 NOTE — Assessment & Plan Note (Signed)
Discussed potential benefits and limitations of PSA testing. Pt would like to proceed with PSA testing.

## 2012-09-27 NOTE — Assessment & Plan Note (Signed)
BG well controlled per pt. Will check A1c with labs today. Follow up 3 months and prn. Foot exam normal today.

## 2012-10-13 LAB — HM DIABETES EYE EXAM

## 2012-10-31 ENCOUNTER — Telehealth: Payer: Self-pay | Admitting: Internal Medicine

## 2012-10-31 ENCOUNTER — Encounter: Payer: Self-pay | Admitting: Internal Medicine

## 2012-10-31 NOTE — Telephone Encounter (Signed)
Fwd to Dr. Walker 

## 2012-10-31 NOTE — Telephone Encounter (Signed)
Left message informing patient letter has been done, just let me know if he would like to come by the office to pick it up or mailed to him?

## 2012-10-31 NOTE — Telephone Encounter (Signed)
Needing a letter to the [facility] stating that his sugar is under control.

## 2012-11-01 NOTE — Telephone Encounter (Signed)
Patient came in with wife for visit with Raquel, picked up letter while here.

## 2012-12-19 ENCOUNTER — Other Ambulatory Visit: Payer: Self-pay | Admitting: Internal Medicine

## 2012-12-20 ENCOUNTER — Telehealth: Payer: Self-pay | Admitting: Internal Medicine

## 2012-12-20 NOTE — Telephone Encounter (Signed)
Prescription was sent to the pharmacy yesterday.

## 2012-12-20 NOTE — Telephone Encounter (Signed)
He needs to contact his pharmacy

## 2012-12-20 NOTE — Telephone Encounter (Signed)
glipiZIDE-metformin (METAGLIP) 2.5-500 MG per tablet  #90

## 2012-12-30 ENCOUNTER — Ambulatory Visit: Payer: Medicare Other | Admitting: Internal Medicine

## 2013-01-07 ENCOUNTER — Telehealth: Payer: Self-pay | Admitting: *Deleted

## 2013-01-07 NOTE — Telephone Encounter (Signed)
Left message with patient wife and she agreed to inform patient

## 2013-01-07 NOTE — Telephone Encounter (Signed)
Would like to know if he should be fasting when he comes in for his appointment on Monday?

## 2013-01-07 NOTE — Telephone Encounter (Signed)
Does not need to be fasting

## 2013-01-13 ENCOUNTER — Encounter: Payer: Self-pay | Admitting: Internal Medicine

## 2013-01-13 ENCOUNTER — Ambulatory Visit (INDEPENDENT_AMBULATORY_CARE_PROVIDER_SITE_OTHER): Payer: Medicare Other | Admitting: Internal Medicine

## 2013-01-13 VITALS — BP 130/90 | HR 61 | Temp 97.7°F | Ht 65.25 in | Wt 202.8 lb

## 2013-01-13 DIAGNOSIS — E785 Hyperlipidemia, unspecified: Secondary | ICD-10-CM | POA: Diagnosis not present

## 2013-01-13 DIAGNOSIS — IMO0002 Reserved for concepts with insufficient information to code with codable children: Secondary | ICD-10-CM

## 2013-01-13 DIAGNOSIS — E1149 Type 2 diabetes mellitus with other diabetic neurological complication: Secondary | ICD-10-CM | POA: Diagnosis not present

## 2013-01-13 DIAGNOSIS — M109 Gout, unspecified: Secondary | ICD-10-CM

## 2013-01-13 DIAGNOSIS — E1142 Type 2 diabetes mellitus with diabetic polyneuropathy: Secondary | ICD-10-CM | POA: Diagnosis not present

## 2013-01-13 DIAGNOSIS — I10 Essential (primary) hypertension: Secondary | ICD-10-CM

## 2013-01-13 DIAGNOSIS — E1165 Type 2 diabetes mellitus with hyperglycemia: Principal | ICD-10-CM

## 2013-01-13 DIAGNOSIS — E1140 Type 2 diabetes mellitus with diabetic neuropathy, unspecified: Secondary | ICD-10-CM

## 2013-01-13 LAB — COMPREHENSIVE METABOLIC PANEL
ALT: 45 U/L (ref 0–53)
AST: 37 U/L (ref 0–37)
Albumin: 4.4 g/dL (ref 3.5–5.2)
Alkaline Phosphatase: 61 U/L (ref 39–117)
BUN: 17 mg/dL (ref 6–23)
CO2: 28 meq/L (ref 19–32)
CREATININE: 1.4 mg/dL (ref 0.4–1.5)
Calcium: 9.7 mg/dL (ref 8.4–10.5)
Chloride: 106 mEq/L (ref 96–112)
GFR: 55.56 mL/min — AB (ref >60.00)
GLUCOSE: 136 mg/dL — AB (ref 70–99)
Potassium: 4.6 mEq/L (ref 3.5–5.1)
SODIUM: 146 meq/L — AB (ref 135–145)
TOTAL PROTEIN: 7 g/dL (ref 6.0–8.3)
Total Bilirubin: 1.1 mg/dL (ref 0.3–1.2)

## 2013-01-13 LAB — HM DIABETES FOOT EXAM: HM Diabetic Foot Exam: NORMAL

## 2013-01-13 LAB — MICROALBUMIN / CREATININE URINE RATIO
Creatinine,U: 95.6 mg/dL
MICROALB UR: 58.7 mg/dL — AB (ref 0.0–1.9)
MICROALB/CREAT RATIO: 61.4 mg/g — AB (ref 0.0–30.0)

## 2013-01-13 LAB — URIC ACID: URIC ACID, SERUM: 5.4 mg/dL (ref 4.0–7.8)

## 2013-01-13 LAB — HEMOGLOBIN A1C: HEMOGLOBIN A1C: 6.4 % (ref 4.6–6.5)

## 2013-01-13 NOTE — Progress Notes (Signed)
   Subjective:    Patient ID: Shane Jones, male    DOB: 1945/09/12, 68 y.o.   MRN: [id number]  HPI 68YO male with DM, gout, neuropathy, hypertension presents for follow up.  Started back to work full time, working driving truck at night. Not checking blood sugars. Compliant with medications. No concerns today. Feeling "great."  Outpatient Encounter Prescriptions as of 01/13/2013  Medication Sig  . allopurinol (ZYLOPRIM) 100 MG tablet TAKE 1 TABLET DAILY  . amLODipine (NORVASC) 10 MG tablet TAKE 1 TABLET DAILY  . atorvastatin (LIPITOR) 20 MG tablet TAKE 1 TABLET EVERY DAY  . gabapentin (NEURONTIN) 300 MG capsule Take 1 capsule (300 mg total) by mouth 4 (four) times daily.  Marland Kitchen glipiZIDE-metformin (METAGLIP) 2.5-500 MG per tablet TAKE 1 TABLET DAILY  . losartan (COZAAR) 100 MG tablet TAKE 1 TABLET DAILY  . metoprolol succinate (TOPROL-XL) 100 MG 24 hr tablet TAKE 1 TABLET DAILY  . Multiple Vitamins-Minerals (CENTRUM) tablet Take 1 tablet by mouth daily.  . nabumetone (RELAFEN) 500 MG tablet TAKE 1 TABLET TWICE DAILY   BP 130/90  Pulse 61  Temp(Src) 97.7 F (36.5 C) (Oral)  Ht 5' 5.25" (1.657 m)  Wt 202 lb 12 oz (91.967 kg)  BMI 33.50 kg/m2  SpO2 97%   Review of Systems  Constitutional: Negative for fever, chills, activity change, appetite change, fatigue and unexpected weight change.  Eyes: Negative for visual disturbance.  Respiratory: Negative for cough and shortness of breath.   Cardiovascular: Negative for chest pain, palpitations and leg swelling.  Gastrointestinal: Negative for abdominal pain and abdominal distention.  Genitourinary: Negative for dysuria, urgency and difficulty urinating.  Musculoskeletal: Negative for arthralgias and gait problem.  Skin: Negative for color change and rash.  Hematological: Negative for adenopathy.  Psychiatric/Behavioral: Negative for sleep disturbance and dysphoric mood. The patient is not nervous/anxious.        Objective:   Physical  Exam  Constitutional: He is oriented to person, place, and time. He appears well-developed and well-nourished. No distress.  HENT:  Head: Normocephalic and atraumatic.  Right Ear: External ear normal.  Left Ear: External ear normal.  Nose: Nose normal.  Mouth/Throat: Oropharynx is clear and moist. No oropharyngeal exudate.  Eyes: Conjunctivae and EOM are normal. Pupils are equal, round, and reactive to light. Right eye exhibits no discharge. Left eye exhibits no discharge. No scleral icterus.  Neck: Normal range of motion. Neck supple. No tracheal deviation present. No thyromegaly present.  Cardiovascular: Normal rate, regular rhythm and normal heart sounds.  Exam reveals no gallop and no friction rub.   No murmur heard. Pulmonary/Chest: Effort normal and breath sounds normal. No respiratory distress. He has no wheezes. He has no rales. He exhibits no tenderness.  Musculoskeletal: Normal range of motion. He exhibits no edema.  Lymphadenopathy:    He has no cervical adenopathy.  Neurological: He is alert and oriented to person, place, and time. No cranial nerve deficit. Coordination normal.  Skin: Skin is warm and dry. No rash noted. He is not diaphoretic. No erythema. No pallor.  Psychiatric: He has a normal mood and affect. His behavior is normal. Judgment and thought content normal.          Assessment & Plan:

## 2013-01-13 NOTE — Progress Notes (Signed)
Pre-visit discussion using our clinic review tool. No additional management support is needed unless otherwise documented below in the visit note.  

## 2013-01-13 NOTE — Assessment & Plan Note (Signed)
Historically severe gout with multijoint involvement. Currently asymptomatic.Will check uric acid level with labs today.

## 2013-01-13 NOTE — Assessment & Plan Note (Signed)
Will check lipids and LFTs with labs today. Continue atorvastatin. 

## 2013-01-13 NOTE — Assessment & Plan Note (Signed)
BP Readings from Last 3 Encounters:  01/13/13 130/90  09/27/12 147/83  06/17/12 158/98   Blood pressure has generally been well-controlled on current medications. Will check renal function with labs.

## 2013-01-13 NOTE — Assessment & Plan Note (Signed)
Will check A1c with labs today. Continue glipizide metformin. Foot exam normal today.

## 2013-01-14 ENCOUNTER — Telehealth: Payer: Self-pay

## 2013-01-14 NOTE — Telephone Encounter (Signed)
Relevant patient education assigned to patient using Emmi. ° °

## 2013-01-15 ENCOUNTER — Telehealth: Payer: Self-pay | Admitting: *Deleted

## 2013-01-15 NOTE — Telephone Encounter (Signed)
Pt is coming in tomorrow 01.15.2015 for labs what labs and dx?

## 2013-01-15 NOTE — Telephone Encounter (Signed)
Repeat BMP for hypernatremia.Shane Jones

## 2013-01-16 ENCOUNTER — Encounter: Payer: Self-pay | Admitting: *Deleted

## 2013-01-16 ENCOUNTER — Other Ambulatory Visit (INDEPENDENT_AMBULATORY_CARE_PROVIDER_SITE_OTHER): Payer: Medicare Other

## 2013-01-16 DIAGNOSIS — E870 Hyperosmolality and hypernatremia: Secondary | ICD-10-CM | POA: Diagnosis not present

## 2013-01-16 LAB — BASIC METABOLIC PANEL
BUN: 18 mg/dL (ref 6–23)
CALCIUM: 9.3 mg/dL (ref 8.4–10.5)
CO2: 29 meq/L (ref 19–32)
CREATININE: 1.3 mg/dL (ref 0.4–1.5)
Chloride: 106 mEq/L (ref 96–112)
GFR: 59.08 mL/min — AB (ref >60.00)
Glucose, Bld: 164 mg/dL — ABNORMAL HIGH (ref 70–99)
Potassium: 3.7 mEq/L (ref 3.5–5.1)
SODIUM: 142 meq/L (ref 135–145)

## 2013-01-29 ENCOUNTER — Other Ambulatory Visit: Payer: Self-pay | Admitting: Internal Medicine

## 2013-02-05 ENCOUNTER — Telehealth: Payer: Self-pay | Admitting: Internal Medicine

## 2013-02-05 NOTE — Telephone Encounter (Signed)
Relevant patient education assigned to patient using Emmi. ° °

## 2013-04-02 ENCOUNTER — Encounter: Payer: Self-pay | Admitting: *Deleted

## 2013-04-14 ENCOUNTER — Ambulatory Visit (INDEPENDENT_AMBULATORY_CARE_PROVIDER_SITE_OTHER): Payer: Medicare Other | Admitting: Internal Medicine

## 2013-04-14 ENCOUNTER — Encounter: Payer: Self-pay | Admitting: Internal Medicine

## 2013-04-14 ENCOUNTER — Ambulatory Visit: Payer: Medicare Other | Admitting: Internal Medicine

## 2013-04-14 VITALS — BP 140/90 | HR 63 | Temp 98.9°F | Wt 197.0 lb

## 2013-04-14 DIAGNOSIS — M549 Dorsalgia, unspecified: Secondary | ICD-10-CM | POA: Diagnosis not present

## 2013-04-14 DIAGNOSIS — Z23 Encounter for immunization: Secondary | ICD-10-CM

## 2013-04-14 DIAGNOSIS — E119 Type 2 diabetes mellitus without complications: Secondary | ICD-10-CM

## 2013-04-14 DIAGNOSIS — I10 Essential (primary) hypertension: Secondary | ICD-10-CM | POA: Diagnosis not present

## 2013-04-14 DIAGNOSIS — E785 Hyperlipidemia, unspecified: Secondary | ICD-10-CM

## 2013-04-14 DIAGNOSIS — G8929 Other chronic pain: Secondary | ICD-10-CM

## 2013-04-14 LAB — COMPREHENSIVE METABOLIC PANEL
ALBUMIN: 4.1 g/dL (ref 3.5–5.2)
ALK PHOS: 55 U/L (ref 39–117)
ALT: 29 U/L (ref 0–53)
AST: 27 U/L (ref 0–37)
BILIRUBIN TOTAL: 0.7 mg/dL (ref 0.3–1.2)
BUN: 10 mg/dL (ref 6–23)
CHLORIDE: 104 meq/L (ref 96–112)
CO2: 28 meq/L (ref 19–32)
Calcium: 9.4 mg/dL (ref 8.4–10.5)
Creatinine, Ser: 1.3 mg/dL (ref 0.4–1.5)
GFR: 60.12 mL/min (ref >60.00)
Glucose, Bld: 117 mg/dL — ABNORMAL HIGH (ref 70–99)
Potassium: 4.2 mEq/L (ref 3.5–5.1)
Sodium: 142 mEq/L (ref 135–145)
Total Protein: 7.2 g/dL (ref 6.0–8.3)

## 2013-04-14 LAB — LIPID PANEL
CHOL/HDL RATIO: 3
Cholesterol: 114 mg/dL (ref 0–200)
HDL: 34.2 mg/dL — ABNORMAL LOW (ref >39.00)
LDL Cholesterol: 61 mg/dL (ref 0–99)
TRIGLYCERIDES: 96 mg/dL (ref 0.0–149.0)
VLDL: 19.2 mg/dL (ref 0.0–40.0)

## 2013-04-14 LAB — MICROALBUMIN / CREATININE URINE RATIO
Creatinine,U: 172.4 mg/dL
MICROALB/CREAT RATIO: 35 mg/g — AB (ref 0.0–30.0)
Microalb, Ur: 60.4 mg/dL — ABNORMAL HIGH (ref 0.0–1.9)

## 2013-04-14 LAB — HEMOGLOBIN A1C: HEMOGLOBIN A1C: 6.2 % (ref 4.6–6.5)

## 2013-04-14 MED ORDER — ALLOPURINOL 100 MG PO TABS: ORAL_TABLET | ORAL | Status: DC

## 2013-04-14 MED ORDER — LOSARTAN POTASSIUM 100 MG PO TABS: ORAL_TABLET | ORAL | Status: DC

## 2013-04-14 MED ORDER — GLIPIZIDE-METFORMIN HCL 2.5-500 MG PO TABS: ORAL_TABLET | ORAL | Status: DC

## 2013-04-14 MED ORDER — GABAPENTIN 300 MG PO CAPS: 300.0000 mg | ORAL_CAPSULE | Freq: Four times a day (QID) | ORAL | Status: DC

## 2013-04-14 MED ORDER — ATORVASTATIN CALCIUM 20 MG PO TABS: ORAL_TABLET | ORAL | Status: DC

## 2013-04-14 MED ORDER — AMLODIPINE BESYLATE 10 MG PO TABS: ORAL_TABLET | ORAL | Status: DC

## 2013-04-14 MED ORDER — NABUMETONE 500 MG PO TABS: ORAL_TABLET | ORAL | Status: DC

## 2013-04-14 MED ORDER — METOPROLOL SUCCINATE ER 100 MG PO TB24: ORAL_TABLET | ORAL | Status: DC

## 2013-04-14 NOTE — Progress Notes (Signed)
Pre visit review using our clinic review tool, if applicable. No additional management support is needed unless otherwise documented below in the visit note. 

## 2013-04-14 NOTE — Progress Notes (Signed)
Subjective:    Patient ID: Shane Jones, male    DOB: Aug 14, 1945, 68 y.o.   MRN: [id number]  HPI 68YO male presents for follow up.  DM - has not recently checked BG. Taking several vitamins including a cinnamon supplement which he feels helps control his BG. Back pain well controlled with massage therapy and current medications. Otherwise feeling well. No concerns today.   Review of Systems  Constitutional: Negative for fever, chills, activity change, appetite change, fatigue and unexpected weight change.  Eyes: Negative for visual disturbance.  Respiratory: Negative for cough and shortness of breath.   Cardiovascular: Negative for chest pain, palpitations and leg swelling.  Gastrointestinal: Negative for abdominal pain and abdominal distention.  Genitourinary: Negative for dysuria, urgency and difficulty urinating.  Musculoskeletal: Positive for arthralgias, back pain and myalgias. Negative for gait problem.  Skin: Negative for color change and rash.  Hematological: Negative for adenopathy.  Psychiatric/Behavioral: Negative for sleep disturbance and dysphoric mood. The patient is not nervous/anxious.        Objective:    BP 140/90  Pulse 63  Temp(Src) 98.9 F (37.2 C) (Oral)  Wt 197 lb (89.359 kg)  SpO2 97% Physical Exam  Constitutional: He is oriented to person, place, and time. He appears well-developed and well-nourished. No distress.  HENT:  Head: Normocephalic and atraumatic.  Right Ear: External ear normal.  Left Ear: External ear normal.  Nose: Nose normal.  Mouth/Throat: Oropharynx is clear and moist. No oropharyngeal exudate.  Eyes: Conjunctivae and EOM are normal. Pupils are equal, round, and reactive to light. Right eye exhibits no discharge. Left eye exhibits no discharge. No scleral icterus.  Neck: Normal range of motion. Neck supple. No tracheal deviation present. No thyromegaly present.  Cardiovascular: Normal rate, regular rhythm and normal heart sounds.   Exam reveals no gallop and no friction rub.   No murmur heard. Pulmonary/Chest: Effort normal and breath sounds normal. No accessory muscle usage. Not tachypneic. No respiratory distress. He has no decreased breath sounds. He has no wheezes. He has no rhonchi. He has no rales. He exhibits no tenderness.  Musculoskeletal: Normal range of motion. He exhibits no edema.  Lymphadenopathy:    He has no cervical adenopathy.  Neurological: He is alert and oriented to person, place, and time. No cranial nerve deficit. Coordination normal.  Skin: Skin is warm and dry. No rash noted. He is not diaphoretic. No erythema. No pallor.  Psychiatric: He has a normal mood and affect. His behavior is normal. Judgment and thought content normal.          Assessment & Plan:   Problem List Items Addressed This Visit   Chronic back pain     Symptoms well controlled with massage therapy and NSAIDS. Will continue.    Relevant Medications      nabumetone (RELAFEN) tablet   Hyperlipidemia LDL goal < 70     Will check lipids and LFTS with labs today. Continue Atorvastatin.    Relevant Medications      amLODIpine (NORVASC) tablet      atorvastatin (LIPITOR) tablet      losartan (COZAAR) tablet      metoprolol succinate (TOPROL-XL) 24 hr tablet   Other Relevant Orders      Lipid panel   Hypertension      BP Readings from Last 3 Encounters:  04/14/13 140/90  01/13/13 130/90  09/27/12 147/83   BP has generally been well controlled, however slightly high today. Will continue to monitor for  now. Check renal function with labs.    Relevant Medications      amLODIpine (NORVASC) tablet      atorvastatin (LIPITOR) tablet      losartan (COZAAR) tablet      metoprolol succinate (TOPROL-XL) 24 hr tablet   Type 2 diabetes, uncontrolled, with neuropathy - Primary     Will check A1c with labs. Continue current medications.    Relevant Medications      atorvastatin (LIPITOR) tablet      glipiZIDE-metformin  (METAGLIP) 2.5-500 MG per tablet      losartan (COZAAR) tablet       Return in about 3 months (around 07/14/2013) for Recheck of Diabetes.

## 2013-04-14 NOTE — Assessment & Plan Note (Signed)
Symptoms well controlled with massage therapy and NSAIDS. Will continue.

## 2013-04-14 NOTE — Assessment & Plan Note (Signed)
BP Readings from Last 3 Encounters:  04/14/13 140/90  01/13/13 130/90  09/27/12 147/83   BP has generally been well controlled, however slightly high today. Will continue to monitor for now. Check renal function with labs.

## 2013-04-14 NOTE — Assessment & Plan Note (Signed)
Will check lipids and LFTS with labs today. Continue Atorvastatin. 

## 2013-04-14 NOTE — Assessment & Plan Note (Signed)
Will check A1c with labs. Continue current medications. 

## 2013-04-14 NOTE — Addendum Note (Signed)
Addended by: Ronaldo Miyamoto on: 04/14/2013 04:53 PM   Modules accepted: Orders

## 2013-04-15 ENCOUNTER — Encounter: Payer: Self-pay | Admitting: *Deleted

## 2013-07-14 ENCOUNTER — Ambulatory Visit (INDEPENDENT_AMBULATORY_CARE_PROVIDER_SITE_OTHER): Payer: Medicare Other | Admitting: Internal Medicine

## 2013-07-14 ENCOUNTER — Encounter: Payer: Self-pay | Admitting: Internal Medicine

## 2013-07-14 VITALS — BP 138/82 | HR 64 | Temp 97.6°F | Ht 65.25 in | Wt 203.2 lb

## 2013-07-14 DIAGNOSIS — E1140 Type 2 diabetes mellitus with diabetic neuropathy, unspecified: Secondary | ICD-10-CM

## 2013-07-14 DIAGNOSIS — IMO0002 Reserved for concepts with insufficient information to code with codable children: Secondary | ICD-10-CM

## 2013-07-14 DIAGNOSIS — G8929 Other chronic pain: Secondary | ICD-10-CM

## 2013-07-14 DIAGNOSIS — M1A9XX1 Chronic gout, unspecified, with tophus (tophi): Secondary | ICD-10-CM

## 2013-07-14 DIAGNOSIS — E1142 Type 2 diabetes mellitus with diabetic polyneuropathy: Secondary | ICD-10-CM | POA: Diagnosis not present

## 2013-07-14 DIAGNOSIS — E1149 Type 2 diabetes mellitus with other diabetic neurological complication: Secondary | ICD-10-CM | POA: Diagnosis not present

## 2013-07-14 DIAGNOSIS — M1A00X1 Idiopathic chronic gout, unspecified site, with tophus (tophi): Secondary | ICD-10-CM

## 2013-07-14 DIAGNOSIS — I10 Essential (primary) hypertension: Secondary | ICD-10-CM | POA: Diagnosis not present

## 2013-07-14 DIAGNOSIS — E785 Hyperlipidemia, unspecified: Secondary | ICD-10-CM

## 2013-07-14 DIAGNOSIS — M549 Dorsalgia, unspecified: Secondary | ICD-10-CM

## 2013-07-14 DIAGNOSIS — E1165 Type 2 diabetes mellitus with hyperglycemia: Principal | ICD-10-CM

## 2013-07-14 LAB — COMPREHENSIVE METABOLIC PANEL
ALT: 28 U/L (ref 0–53)
AST: 31 U/L (ref 0–37)
Albumin: 4.3 g/dL (ref 3.5–5.2)
Alkaline Phosphatase: 59 U/L (ref 39–117)
BUN: 13 mg/dL (ref 6–23)
CO2: 29 meq/L (ref 19–32)
Calcium: 9.6 mg/dL (ref 8.4–10.5)
Chloride: 106 mEq/L (ref 96–112)
Creatinine, Ser: 1.2 mg/dL (ref 0.4–1.5)
GFR: 63.56 mL/min (ref >60.00)
Glucose, Bld: 171 mg/dL — ABNORMAL HIGH (ref 70–99)
POTASSIUM: 4.2 meq/L (ref 3.5–5.1)
SODIUM: 145 meq/L (ref 135–145)
TOTAL PROTEIN: 7.2 g/dL (ref 6.0–8.3)
Total Bilirubin: 0.7 mg/dL (ref 0.2–1.2)

## 2013-07-14 LAB — LIPID PANEL
CHOL/HDL RATIO: 4
Cholesterol: 128 mg/dL (ref 0–200)
HDL: 36 mg/dL — ABNORMAL LOW (ref >39.00)
LDL Cholesterol: 58 mg/dL (ref 0–99)
NONHDL: 92
Triglycerides: 171 mg/dL — ABNORMAL HIGH (ref 0.0–149.0)
VLDL: 34.2 mg/dL (ref 0.0–40.0)

## 2013-07-14 LAB — HEMOGLOBIN A1C: HEMOGLOBIN A1C: 6.7 % — AB (ref 4.6–6.5)

## 2013-07-14 LAB — MICROALBUMIN / CREATININE URINE RATIO
CREATININE, U: 21.4 mg/dL
MICROALB/CREAT RATIO: 58.9 mg/g — AB (ref 0.0–30.0)
Microalb, Ur: 12.6 mg/dL — ABNORMAL HIGH (ref 0.0–1.9)

## 2013-07-14 LAB — URIC ACID: URIC ACID, SERUM: 5.6 mg/dL (ref 4.0–7.8)

## 2013-07-14 NOTE — Assessment & Plan Note (Signed)
Will check lipids and LFTs with labs today. Continue Atorvastatin. 

## 2013-07-14 NOTE — Assessment & Plan Note (Signed)
Symptoms of gout have recently been well controlled. Will check uric acid level with labs.

## 2013-07-14 NOTE — Progress Notes (Signed)
Pre visit review using our clinic review tool, if applicable. No additional management support is needed unless otherwise documented below in the visit note. 

## 2013-07-14 NOTE — Assessment & Plan Note (Signed)
BP Readings from Last 3 Encounters:  07/14/13 138/82  04/14/13 140/90  01/13/13 130/90   BP well controlled with Metoprolol, Amlodipine, and Losartan. Will check renal function with labs today.

## 2013-07-14 NOTE — Patient Instructions (Signed)
We will check labs today.  Continue current medications.  Follow up in 3 months and as needed.

## 2013-07-14 NOTE — Progress Notes (Signed)
Subjective:    Patient ID: Shane Jones, male    DOB: 11/28/1945, 68 y.o.   MRN: [id number]  HPI 68YO male presents for follow up.  DM - BG well controlled per pt, however he did not bring record with him today. Compliant with meds. No recent chest pain, dyspnea, palpitations.  Continues to undergo massage therapy for chronic back pain. Notes some improvement with this.  Review of Systems  Constitutional: Negative for fever, chills, activity change, appetite change, fatigue and unexpected weight change.  Eyes: Negative for visual disturbance.  Respiratory: Negative for cough and shortness of breath.   Cardiovascular: Negative for chest pain, palpitations and leg swelling.  Gastrointestinal: Negative for nausea, vomiting, abdominal pain, diarrhea, constipation and abdominal distention.  Genitourinary: Negative for dysuria, urgency and difficulty urinating.  Musculoskeletal: Positive for back pain and myalgias. Negative for arthralgias and gait problem.  Skin: Negative for color change and rash.  Hematological: Negative for adenopathy.  Psychiatric/Behavioral: Negative for sleep disturbance and dysphoric mood. The patient is not nervous/anxious.        Objective:    BP 138/82  Pulse 64  Temp(Src) 97.6 F (36.4 C) (Oral)  Ht 5' 5.25" (1.657 m)  Wt 203 lb 4 oz (92.194 kg)  BMI 33.58 kg/m2  SpO2 95% Physical Exam  Constitutional: He is oriented to person, place, and time. He appears well-developed and well-nourished. No distress.  HENT:  Head: Normocephalic and atraumatic.  Right Ear: External ear normal.  Left Ear: External ear normal.  Nose: Nose normal.  Mouth/Throat: Oropharynx is clear and moist. No oropharyngeal exudate.  Eyes: Conjunctivae and EOM are normal. Pupils are equal, round, and reactive to light. Right eye exhibits no discharge. Left eye exhibits no discharge. No scleral icterus.  Neck: Normal range of motion. Neck supple. No tracheal deviation present. No  thyromegaly present.  Cardiovascular: Normal rate, regular rhythm and normal heart sounds.  Exam reveals no gallop and no friction rub.   No murmur heard. Pulmonary/Chest: Effort normal and breath sounds normal. No accessory muscle usage. Not tachypneic. No respiratory distress. He has no decreased breath sounds. He has no wheezes. He has no rhonchi. He has no rales. He exhibits no tenderness.  Musculoskeletal: Normal range of motion. He exhibits no edema.  Lymphadenopathy:    He has no cervical adenopathy.  Neurological: He is alert and oriented to person, place, and time. No cranial nerve deficit. Coordination normal.  Skin: Skin is warm and dry. No rash noted. He is not diaphoretic. No erythema. No pallor.  Psychiatric: He has a normal mood and affect. His behavior is normal. Judgment and thought content normal.          Assessment & Plan:   Problem List Items Addressed This Visit     Unprioritized   Chronic back pain     Symptoms well controlled with Relafen and prn massage therapy. Will continue.    Gout     Symptoms of gout have recently been well controlled. Will check uric acid level with labs.    Relevant Orders      Uric acid   Hypertension      BP Readings from Last 3 Encounters:  07/14/13 138/82  04/14/13 140/90  01/13/13 130/90   BP well controlled with Metoprolol, Amlodipine, and Losartan. Will check renal function with labs today.    Relevant Orders      Microalbumin / creatinine urine ratio   Other and unspecified hyperlipidemia  Will check lipids and LFTs with labs today. Continue Atorvastatin.    Relevant Orders      Lipid panel   Type 2 diabetes, uncontrolled, with neuropathy - Primary     Will check A1c with labs today. Continue Glipizide-Metformin.    Relevant Orders      Comprehensive metabolic panel      Hemoglobin A1c       Return in about 3 months (around 10/14/2013) for Recheck of Diabetes.

## 2013-07-14 NOTE — Assessment & Plan Note (Signed)
Will check A1c with labs today. Continue Glipizide-Metformin.

## 2013-07-14 NOTE — Assessment & Plan Note (Signed)
Symptoms well controlled with Relafen and prn massage therapy. Will continue.

## 2013-07-21 ENCOUNTER — Ambulatory Visit: Payer: Medicare Other | Admitting: Internal Medicine

## 2013-09-15 ENCOUNTER — Ambulatory Visit: Payer: Medicare Other | Admitting: Internal Medicine

## 2013-09-15 ENCOUNTER — Other Ambulatory Visit: Payer: Self-pay | Admitting: Internal Medicine

## 2013-09-15 MED ORDER — GABAPENTIN 300 MG PO CAPS: 300.0000 mg | ORAL_CAPSULE | Freq: Four times a day (QID) | ORAL | Status: DC

## 2013-10-20 ENCOUNTER — Ambulatory Visit (INDEPENDENT_AMBULATORY_CARE_PROVIDER_SITE_OTHER): Payer: Medicare Other | Admitting: Internal Medicine

## 2013-10-20 ENCOUNTER — Encounter: Payer: Self-pay | Admitting: Internal Medicine

## 2013-10-20 VITALS — BP 140/82 | HR 63 | Temp 98.2°F | Ht 65.25 in | Wt 204.2 lb

## 2013-10-20 DIAGNOSIS — Z23 Encounter for immunization: Secondary | ICD-10-CM

## 2013-10-20 DIAGNOSIS — E1140 Type 2 diabetes mellitus with diabetic neuropathy, unspecified: Secondary | ICD-10-CM

## 2013-10-20 DIAGNOSIS — I10 Essential (primary) hypertension: Secondary | ICD-10-CM | POA: Diagnosis not present

## 2013-10-20 DIAGNOSIS — Z79899 Other long term (current) drug therapy: Secondary | ICD-10-CM | POA: Diagnosis not present

## 2013-10-20 DIAGNOSIS — E1165 Type 2 diabetes mellitus with hyperglycemia: Secondary | ICD-10-CM | POA: Diagnosis not present

## 2013-10-20 DIAGNOSIS — G629 Polyneuropathy, unspecified: Secondary | ICD-10-CM | POA: Diagnosis not present

## 2013-10-20 DIAGNOSIS — IMO0002 Reserved for concepts with insufficient information to code with codable children: Secondary | ICD-10-CM

## 2013-10-20 LAB — COMPREHENSIVE METABOLIC PANEL
ALBUMIN: 3.7 g/dL (ref 3.5–5.2)
ALK PHOS: 62 U/L (ref 39–117)
ALT: 35 U/L (ref 0–53)
AST: 32 U/L (ref 0–37)
BUN: 16 mg/dL (ref 6–23)
CALCIUM: 9.7 mg/dL (ref 8.4–10.5)
CHLORIDE: 101 meq/L (ref 96–112)
CO2: 28 mEq/L (ref 19–32)
Creatinine, Ser: 1.4 mg/dL (ref 0.4–1.5)
GFR: 54.51 mL/min — ABNORMAL LOW (ref >60.00)
Glucose, Bld: 254 mg/dL — ABNORMAL HIGH (ref 70–99)
POTASSIUM: 4.2 meq/L (ref 3.5–5.1)
SODIUM: 141 meq/L (ref 135–145)
TOTAL PROTEIN: 7.1 g/dL (ref 6.0–8.3)
Total Bilirubin: 1 mg/dL (ref 0.2–1.2)

## 2013-10-20 LAB — LIPID PANEL
CHOL/HDL RATIO: 5
Cholesterol: 122 mg/dL (ref 0–200)
HDL: 25.7 mg/dL — ABNORMAL LOW (ref >39.00)
NONHDL: 96.3
Triglycerides: 369 mg/dL — ABNORMAL HIGH (ref 0.0–149.0)
VLDL: 73.8 mg/dL — ABNORMAL HIGH (ref 0.0–40.0)

## 2013-10-20 LAB — MICROALBUMIN / CREATININE URINE RATIO
CREATININE, U: 146.4 mg/dL
MICROALB UR: 69.5 mg/dL — AB (ref 0.0–1.9)
MICROALB/CREAT RATIO: 47.5 mg/g — AB (ref 0.0–30.0)

## 2013-10-20 LAB — HEMOGLOBIN A1C: Hgb A1c MFr Bld: 6.5 % (ref 4.6–6.5)

## 2013-10-20 MED ORDER — GABAPENTIN 400 MG PO CAPS: 400.0000 mg | ORAL_CAPSULE | Freq: Three times a day (TID) | ORAL | Status: DC

## 2013-10-20 NOTE — Patient Instructions (Addendum)
Labs today.  Flu shot today.  Increase Neurontin to 400mg  three times daily. Do not exceed this dose.  Follow up in 3 months.

## 2013-10-20 NOTE — Progress Notes (Signed)
Subjective:    Patient ID: Shane Jones, male    DOB: 1945-04-01, 68 y.o.   MRN: [id number]  HPI 68YO male presents for follow up.  DM - Has not checked BG. Compliant with medications. Taking Cinnamon.  Neuropathy - Taking up to 5 tablets, 300mg  each, at one time. Has some drowsiness with this. However, burning pain improved.  No new concerns today.  Review of Systems  Constitutional: Positive for fatigue. Negative for fever, chills, activity change, appetite change and unexpected weight change.  Eyes: Negative for visual disturbance.  Respiratory: Negative for cough and shortness of breath.   Cardiovascular: Negative for chest pain, palpitations and leg swelling.  Gastrointestinal: Negative for nausea, vomiting, abdominal pain, diarrhea, constipation and abdominal distention.  Genitourinary: Negative for dysuria, urgency and difficulty urinating.  Musculoskeletal: Negative for arthralgias, gait problem and myalgias.  Skin: Negative for color change and rash.  Neurological: Negative for weakness.  Hematological: Negative for adenopathy.  Psychiatric/Behavioral: Negative for sleep disturbance and dysphoric mood. The patient is not nervous/anxious.        Objective:    BP 140/82  Pulse 63  Temp(Src) 98.2 F (36.8 C) (Oral)  Ht 5' 5.25" (1.657 m)  Wt 204 lb 4 oz (92.647 kg)  BMI 33.74 kg/m2  SpO2 97% Physical Exam  Constitutional: He is oriented to person, place, and time. He appears well-developed and well-nourished. No distress.  HENT:  Head: Normocephalic and atraumatic.  Right Ear: External ear normal.  Left Ear: External ear normal.  Nose: Nose normal.  Mouth/Throat: Oropharynx is clear and moist. No oropharyngeal exudate.  Eyes: Conjunctivae and EOM are normal. Pupils are equal, round, and reactive to light. Right eye exhibits no discharge. Left eye exhibits no discharge. No scleral icterus.  Neck: Normal range of motion. Neck supple. No tracheal deviation present.  No thyromegaly present.  Cardiovascular: Normal rate, regular rhythm and normal heart sounds.  Exam reveals no gallop and no friction rub.   No murmur heard. Pulmonary/Chest: Effort normal and breath sounds normal. No accessory muscle usage. Not tachypneic. No respiratory distress. He has no decreased breath sounds. He has no wheezes. He has no rhonchi. He has no rales. He exhibits no tenderness.  Musculoskeletal: Normal range of motion. He exhibits no edema.  Lymphadenopathy:    He has no cervical adenopathy.  Neurological: He is alert and oriented to person, place, and time. No cranial nerve deficit. Coordination normal.  Skin: Skin is warm and dry. No rash noted. He is not diaphoretic. No erythema. No pallor.  Psychiatric: He has a normal mood and affect. His behavior is normal. Judgment and thought content normal.          Assessment & Plan:   Problem List Items Addressed This Visit     Unprioritized   Hypertension      BP Readings from Last 3 Encounters:  10/20/13 140/82  07/14/13 138/82  04/14/13 140/90   BP well controlled generally. Renal function with labs today. Continue Losartan.    Neuropathy     Discussed the risks of overdose with Neurontin. Encouraged him to follow medication instructions. Will change dosing to 400mg  tid for better compliance. Follow up 3 months and prn.    Type 2 diabetes, uncontrolled, with neuropathy - Primary     Will check A1c with labs today. Continue current medications.    Relevant Orders      Comprehensive metabolic panel      Hemoglobin A1c  Lipid panel      Microalbumin / creatinine urine ratio       Return in about 3 months (around 01/20/2014) for Wellness Visit.

## 2013-10-20 NOTE — Assessment & Plan Note (Signed)
BP Readings from Last 3 Encounters:  10/20/13 140/82  07/14/13 138/82  04/14/13 140/90   BP well controlled generally. Renal function with labs today. Continue Losartan.

## 2013-10-20 NOTE — Progress Notes (Signed)
Pre visit review using our clinic review tool, if applicable. No additional management support is needed unless otherwise documented below in the visit note. 

## 2013-10-20 NOTE — Assessment & Plan Note (Signed)
Discussed the risks of overdose with Neurontin. Encouraged him to follow medication instructions. Will change dosing to 400mg  tid for better compliance. Follow up 3 months and prn.

## 2013-10-20 NOTE — Assessment & Plan Note (Signed)
Will check A1c with labs today. Continue current medications. 

## 2013-10-21 ENCOUNTER — Encounter: Payer: Self-pay | Admitting: *Deleted

## 2013-10-21 LAB — LDL CHOLESTEROL, DIRECT: Direct LDL: 53.7 mg/dL

## 2013-10-22 ENCOUNTER — Telehealth: Payer: Self-pay | Admitting: Internal Medicine

## 2013-10-22 ENCOUNTER — Encounter: Payer: Self-pay | Admitting: Internal Medicine

## 2013-10-22 NOTE — Telephone Encounter (Signed)
Letter sent to you and printed.

## 2013-10-22 NOTE — Telephone Encounter (Signed)
Mailed letter to pt

## 2013-10-22 NOTE — Telephone Encounter (Signed)
Please see below note

## 2013-10-22 NOTE — Telephone Encounter (Signed)
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

## 2014-01-26 ENCOUNTER — Encounter: Payer: Medicare Other | Admitting: Internal Medicine

## 2014-03-23 ENCOUNTER — Telehealth: Payer: Self-pay | Admitting: *Deleted

## 2014-03-23 NOTE — Telephone Encounter (Signed)
Pt walked into office, complaining of bilateral ankle edema x 3-4 weeks. Denies SOB. Appointment scheduled with Dr. Gilford Rile for tomorrow.

## 2014-03-24 ENCOUNTER — Encounter: Payer: Self-pay | Admitting: Internal Medicine

## 2014-03-24 ENCOUNTER — Ambulatory Visit (INDEPENDENT_AMBULATORY_CARE_PROVIDER_SITE_OTHER): Payer: Medicare Other | Admitting: Internal Medicine

## 2014-03-24 VITALS — BP 144/82 | HR 73 | Temp 98.0°F | Ht 65.25 in | Wt 205.0 lb

## 2014-03-24 DIAGNOSIS — E1165 Type 2 diabetes mellitus with hyperglycemia: Secondary | ICD-10-CM | POA: Diagnosis not present

## 2014-03-24 DIAGNOSIS — I10 Essential (primary) hypertension: Secondary | ICD-10-CM

## 2014-03-24 DIAGNOSIS — M7989 Other specified soft tissue disorders: Secondary | ICD-10-CM

## 2014-03-24 DIAGNOSIS — E1140 Type 2 diabetes mellitus with diabetic neuropathy, unspecified: Secondary | ICD-10-CM

## 2014-03-24 DIAGNOSIS — IMO0002 Reserved for concepts with insufficient information to code with codable children: Secondary | ICD-10-CM

## 2014-03-24 MED ORDER — FUROSEMIDE 20 MG PO TABS
20.0000 mg | ORAL_TABLET | Freq: Every day | ORAL | Status: DC
Start: 2014-03-24 — End: 2014-05-12

## 2014-03-24 NOTE — Assessment & Plan Note (Signed)
Will check A1c with labs. Continue Glipizide-Metformin. Follow up next week.

## 2014-03-24 NOTE — Patient Instructions (Signed)
Start Furosemide (Lasix) 20mg  daily for the next two nights.  Please call with update Thursday.  Follow up next week.

## 2014-03-24 NOTE — Assessment & Plan Note (Signed)
BP Readings from Last 3 Encounters:  03/24/14 144/82  10/20/13 140/82  07/14/13 138/82   BP generally well controlled, slightly higher today. Discussed that Amlodipine may cause leg swelling. Will have him follow up next week.

## 2014-03-24 NOTE — Progress Notes (Signed)
Pre visit review using our clinic review tool, if applicable. No additional management support is needed unless otherwise documented below in the visit note. 

## 2014-03-24 NOTE — Assessment & Plan Note (Addendum)
Bilateral leg swelling. Likely chronic mild venous insufficiency. Possibly exacerbated by use of Amlodipine. Will add Furosemide x 2 days. Avoiding HCTZ because of exacerbation of gout with this in the past. Check renal function with labs today. Follow up by phone in 2 days and in visit next week. May have to consider stopping Amlodipine if persistent.

## 2014-03-24 NOTE — Progress Notes (Signed)
Subjective:    Patient ID: Shane Jones, male    DOB: February 20, 1945, 69 y.o.   MRN: [id number]  HPI  69YO male presents for acute visit.  Bilateral leg swelling - Ankles and feet have been swollen for last 2-3 weeks. No change to diet. No change to medications, except for added an OTC potassium tablet. No recent travel. Mild discomfort with swelling. No dyspnea or chest pain.  DM - Did not bring record of BG. Compliant with medications.  HTN - Does not generally check BP.  Compliant with medications. No CP, HA, palpitations.  Past medical, surgical, family and social history per today's encounter.  Review of Systems  Constitutional: Negative for fever, chills, activity change, appetite change, fatigue and unexpected weight change.  Eyes: Negative for visual disturbance.  Respiratory: Negative for cough and shortness of breath.   Cardiovascular: Positive for leg swelling. Negative for chest pain and palpitations.  Gastrointestinal: Negative for nausea, vomiting, abdominal pain, diarrhea, constipation and abdominal distention.  Genitourinary: Negative for dysuria, urgency and difficulty urinating.  Musculoskeletal: Negative for myalgias, arthralgias and gait problem.  Skin: Negative for color change and rash.  Neurological: Negative for headaches.  Hematological: Negative for adenopathy.  Psychiatric/Behavioral: Negative for sleep disturbance and dysphoric mood. The patient is not nervous/anxious.        Objective:    BP 144/82 mmHg  Pulse 73  Temp(Src) 98 F (36.7 C) (Oral)  Ht 5' 5.25" (1.657 m)  Wt 205 lb (92.987 kg)  BMI 33.87 kg/m2  SpO2 98% Physical Exam  Constitutional: He is oriented to person, place, and time. He appears well-developed and well-nourished. No distress.  HENT:  Head: Normocephalic and atraumatic.  Right Ear: External ear normal.  Left Ear: External ear normal.  Nose: Nose normal.  Mouth/Throat: Oropharynx is clear and moist. No oropharyngeal  exudate.  Eyes: Conjunctivae and EOM are normal. Pupils are equal, round, and reactive to light. Right eye exhibits no discharge. Left eye exhibits no discharge. No scleral icterus.  Neck: Normal range of motion. Neck supple. No tracheal deviation present. No thyromegaly present.  Cardiovascular: Normal rate, regular rhythm and normal heart sounds.  Exam reveals no gallop and no friction rub.   No murmur heard. Pulmonary/Chest: Effort normal and breath sounds normal. No accessory muscle usage. No tachypnea. No respiratory distress. He has no decreased breath sounds. He has no wheezes. He has no rhonchi. He has no rales. He exhibits no tenderness.  Musculoskeletal: Normal range of motion. He exhibits edema (pitting edema to mid shin bilaterally).  Lymphadenopathy:    He has no cervical adenopathy.  Neurological: He is alert and oriented to person, place, and time. No cranial nerve deficit. Coordination normal.  Skin: Skin is warm and dry. No rash noted. He is not diaphoretic. No erythema. No pallor.  Psychiatric: He has a normal mood and affect. His behavior is normal. Judgment and thought content normal.          Assessment & Plan:   Problem List Items Addressed This Visit      Unprioritized   Hypertension    BP Readings from Last 3 Encounters:  03/24/14 144/82  10/20/13 140/82  07/14/13 138/82   BP generally well controlled, slightly higher today. Discussed that Amlodipine may cause leg swelling. Will have him follow up next week.       Relevant Medications   furosemide (LASIX) tablet   Leg swelling - Primary    Bilateral leg swelling. Likely chronic mild  venous insufficiency. Possibly exacerbated by use of Amlodipine. Will add Furosemide x 2 days. Avoiding HCTZ because of exacerbation of gout with this in the past. Check renal function with labs today. Follow up by phone in 2 days and in visit next week. May have to consider stopping Amlodipine if persistent.      Relevant  Medications   furosemide (LASIX) tablet   Type 2 diabetes, uncontrolled, with neuropathy    Will check A1c with labs. Continue Glipizide-Metformin. Follow up next week.      Relevant Orders   Comprehensive metabolic panel   Hemoglobin A1c   Lipid panel   Microalbumin / creatinine urine ratio       Return in about 1 week (around 03/31/2014) for Recheck.

## 2014-03-24 NOTE — Addendum Note (Signed)
Addended by: Vernetta Honey on: 03/24/2014 04:59 PM   Modules accepted: Medications

## 2014-03-25 LAB — LIPID PANEL
Cholesterol: 128 mg/dL (ref 0–200)
HDL: 34.2 mg/dL — AB (ref >39.00)
NONHDL: 93.8
TRIGLYCERIDES: 260 mg/dL — AB (ref 0.0–149.0)
Total CHOL/HDL Ratio: 4
VLDL: 52 mg/dL — AB (ref 0.0–40.0)

## 2014-03-25 LAB — COMPREHENSIVE METABOLIC PANEL
ALT: 25 U/L (ref 0–53)
AST: 24 U/L (ref 0–37)
Albumin: 4.3 g/dL (ref 3.5–5.2)
Alkaline Phosphatase: 69 U/L (ref 39–117)
BILIRUBIN TOTAL: 0.6 mg/dL (ref 0.2–1.2)
BUN: 13 mg/dL (ref 6–23)
CALCIUM: 9.5 mg/dL (ref 8.4–10.5)
CO2: 28 meq/L (ref 19–32)
CREATININE: 1.31 mg/dL (ref 0.40–1.50)
Chloride: 104 mEq/L (ref 96–112)
GFR: 57.33 mL/min — ABNORMAL LOW (ref >60.00)
Glucose, Bld: 291 mg/dL — ABNORMAL HIGH (ref 70–99)
Potassium: 4.1 mEq/L (ref 3.5–5.1)
Sodium: 140 mEq/L (ref 135–145)
Total Protein: 7.1 g/dL (ref 6.0–8.3)

## 2014-03-25 LAB — MICROALBUMIN / CREATININE URINE RATIO
Creatinine,U: 162.2 mg/dL
MICROALB/CREAT RATIO: 92.7 mg/g — AB (ref 0.0–30.0)
Microalb, Ur: 150.3 mg/dL — ABNORMAL HIGH (ref 0.0–1.9)

## 2014-03-25 LAB — LDL CHOLESTEROL, DIRECT: LDL DIRECT: 66 mg/dL

## 2014-03-25 LAB — HEMOGLOBIN A1C: Hgb A1c MFr Bld: 7 % — ABNORMAL HIGH (ref 4.6–6.5)

## 2014-03-26 ENCOUNTER — Telehealth: Payer: Self-pay | Admitting: *Deleted

## 2014-03-26 NOTE — Telephone Encounter (Signed)
That is fine, however Lasix can be hard on the kidneys. We need to check a BMP next week.

## 2014-03-26 NOTE — Telephone Encounter (Signed)
Notified pt. 

## 2014-03-26 NOTE — Telephone Encounter (Signed)
Pt left message stating that the Lasix is working great, pt states that he feels the best he has felt in years and is requesting to continue taking the medication

## 2014-03-31 ENCOUNTER — Ambulatory Visit (INDEPENDENT_AMBULATORY_CARE_PROVIDER_SITE_OTHER): Payer: Medicare Other | Admitting: Internal Medicine

## 2014-03-31 ENCOUNTER — Encounter: Payer: Self-pay | Admitting: Internal Medicine

## 2014-03-31 VITALS — BP 131/79 | HR 84 | Temp 98.9°F | Resp 14 | Ht 65.0 in | Wt 202.5 lb

## 2014-03-31 DIAGNOSIS — M7989 Other specified soft tissue disorders: Secondary | ICD-10-CM

## 2014-03-31 DIAGNOSIS — I10 Essential (primary) hypertension: Secondary | ICD-10-CM

## 2014-03-31 NOTE — Assessment & Plan Note (Signed)
Leg swelling has improved with Furosemide. Discussed risks of taking this medication daily. Will check renal function with labs.

## 2014-03-31 NOTE — Progress Notes (Signed)
Subjective:    Patient ID: Shane Jones, male    DOB: [birth year], 69 y.o.   MRN: [id number]  HPI 69YO male presents for follow up.  Significant improvement in LE swelling with addition of Lasix. Has been taking every day because feels so much better on medication. Edema has improved. Breathing is better.   BP Readings from Last 3 Encounters:  03/31/14 131/79  03/24/14 144/82  10/20/13 140/82   Wt Readings from Last 3 Encounters:  03/31/14 202 lb 8 oz (91.853 kg)  03/24/14 205 lb (92.987 kg)  10/20/13 204 lb 4 oz (92.647 kg)     Past medical, surgical, family and social history per today's encounter.  Review of Systems  Constitutional: Negative for fever, chills, activity change, appetite change, fatigue and unexpected weight change.  Eyes: Negative for visual disturbance.  Respiratory: Negative for cough and shortness of breath.   Cardiovascular: Negative for chest pain, palpitations and leg swelling.  Gastrointestinal: Negative for abdominal pain and abdominal distention.  Genitourinary: Negative for dysuria, urgency and difficulty urinating.  Musculoskeletal: Negative for arthralgias and gait problem.  Skin: Negative for color change and rash.  Hematological: Negative for adenopathy.  Psychiatric/Behavioral: Negative for sleep disturbance and dysphoric mood. The patient is not nervous/anxious.        Objective:    BP 131/79 mmHg  Pulse 84  Temp(Src) 98.9 F (37.2 C) (Oral)  Resp 14  Ht 5\' 5"  (1.651 m)  Wt 202 lb 8 oz (91.853 kg)  BMI 33.70 kg/m2  SpO2 97% Physical Exam  Constitutional: He is oriented to person, place, and time. He appears well-developed and well-nourished. No distress.  HENT:  Head: Normocephalic and atraumatic.  Right Ear: External ear normal.  Left Ear: External ear normal.  Nose: Nose normal.  Mouth/Throat: Oropharynx is clear and moist. No oropharyngeal exudate.  Eyes: Conjunctivae and EOM are normal. Pupils are equal, round, and  reactive to light. Right eye exhibits no discharge. Left eye exhibits no discharge. No scleral icterus.  Neck: Normal range of motion. Neck supple. No tracheal deviation present. No thyromegaly present.  Cardiovascular: Normal rate, regular rhythm and normal heart sounds.  Exam reveals no gallop and no friction rub.   No murmur heard. Pulmonary/Chest: Effort normal and breath sounds normal. No accessory muscle usage. No tachypnea. No respiratory distress. He has no decreased breath sounds. He has no wheezes. He has no rhonchi. He has no rales. He exhibits no tenderness.  Musculoskeletal: Normal range of motion. He exhibits no edema.  Lymphadenopathy:    He has no cervical adenopathy.  Neurological: He is alert and oriented to person, place, and time. No cranial nerve deficit. Coordination normal.  Skin: Skin is warm and dry. No rash noted. He is not diaphoretic. No erythema. No pallor.  Psychiatric: He has a normal mood and affect. His behavior is normal. Judgment and thought content normal.          Assessment & Plan:   Problem List Items Addressed This Visit      Unprioritized   Hypertension    BP Readings from Last 3 Encounters:  03/31/14 131/79  03/24/14 144/82  10/20/13 140/82   BP improved today. Will check renal function and electrolytes with labs.      Leg swelling - Primary    Leg swelling has improved with Furosemide. Discussed risks of taking this medication daily. Will check renal function with labs.      Relevant Orders   Basic Metabolic Panel (  BMET)       Return in about 3 months (around 07/01/2014) for Recheck.

## 2014-03-31 NOTE — Progress Notes (Signed)
Pre visit review using our clinic review tool, if applicable. No additional management support is needed unless otherwise documented below in the visit note. 

## 2014-03-31 NOTE — Assessment & Plan Note (Signed)
BP Readings from Last 3 Encounters:  03/31/14 131/79  03/24/14 144/82  10/20/13 140/82   BP improved today. Will check renal function and electrolytes with labs.

## 2014-03-31 NOTE — Patient Instructions (Signed)
Labs today.  Follow up in 3 months or sooner as needed. 

## 2014-04-01 LAB — BASIC METABOLIC PANEL
BUN: 16 mg/dL (ref 6–23)
CHLORIDE: 108 meq/L (ref 96–112)
CO2: 28 mEq/L (ref 19–32)
Calcium: 9.7 mg/dL (ref 8.4–10.5)
Creatinine, Ser: 1.33 mg/dL (ref 0.40–1.50)
GFR: 56.33 mL/min — AB (ref >60.00)
GLUCOSE: 152 mg/dL — AB (ref 70–99)
Potassium: 3.5 mEq/L (ref 3.5–5.1)
Sodium: 143 mEq/L (ref 135–145)

## 2014-04-29 ENCOUNTER — Encounter: Payer: Self-pay | Admitting: Nurse Practitioner

## 2014-04-29 ENCOUNTER — Ambulatory Visit (INDEPENDENT_AMBULATORY_CARE_PROVIDER_SITE_OTHER): Payer: Medicare Other | Admitting: Nurse Practitioner

## 2014-04-29 VITALS — BP 122/72 | HR 67 | Temp 97.7°F | Resp 14 | Ht 65.25 in | Wt 205.8 lb

## 2014-04-29 DIAGNOSIS — R252 Cramp and spasm: Secondary | ICD-10-CM

## 2014-04-29 LAB — COMPREHENSIVE METABOLIC PANEL
ALBUMIN: 4.2 g/dL (ref 3.5–5.2)
ALT: 27 U/L (ref 0–53)
AST: 23 U/L (ref 0–37)
Alkaline Phosphatase: 70 U/L (ref 39–117)
BUN: 13 mg/dL (ref 6–23)
CHLORIDE: 101 meq/L (ref 96–112)
CO2: 33 meq/L — AB (ref 19–32)
Calcium: 9.4 mg/dL (ref 8.4–10.5)
Creatinine, Ser: 1.75 mg/dL — ABNORMAL HIGH (ref 0.40–1.50)
GFR: 41.03 mL/min — ABNORMAL LOW (ref >60.00)
GLUCOSE: 299 mg/dL — AB (ref 70–99)
POTASSIUM: 3.6 meq/L (ref 3.5–5.1)
Sodium: 140 mEq/L (ref 135–145)
TOTAL PROTEIN: 6.2 g/dL (ref 6.0–8.3)
Total Bilirubin: 0.5 mg/dL (ref 0.2–1.2)

## 2014-04-29 LAB — MAGNESIUM: MAGNESIUM: 2 mg/dL (ref 1.5–2.5)

## 2014-04-29 NOTE — Progress Notes (Signed)
Pre visit review using our clinic review tool, if applicable. No additional management support is needed unless otherwise documented below in the visit note. 

## 2014-04-29 NOTE — Patient Instructions (Signed)
Please visit the lab before leaving today. We will contact you with your results.   Muscle Cramps and Spasms Muscle cramps and spasms occur when a muscle or muscles tighten and you have no control over this tightening (involuntary muscle contraction). They are a common problem and can develop in any muscle. The most common place is in the calf muscles of the leg. Both muscle cramps and muscle spasms are involuntary muscle contractions, but they also have differences:   Muscle cramps are sporadic and painful. They may last a few seconds to a quarter of an hour. Muscle cramps are often more forceful and last longer than muscle spasms.  Muscle spasms may or may not be painful. They may also last just a few seconds or much longer. CAUSES  It is uncommon for cramps or spasms to be due to a serious underlying problem. In many cases, the cause of cramps or spasms is unknown. Some common causes are:   Overexertion.   Overuse from repetitive motions (doing the same thing over and over).   Remaining in a certain position for a long period of time.   Improper preparation, form, or technique while performing a sport or activity.   Dehydration.   Injury.   Side effects of some medicines.   Abnormally low levels of the salts and ions in your blood (electrolytes), especially potassium and calcium. This could happen if you are taking water pills (diuretics) or you are pregnant.  Some underlying medical problems can make it more likely to develop cramps or spasms. These include, but are not limited to:   Diabetes.   Parkinson disease.   Hormone disorders, such as thyroid problems.   Alcohol abuse.   Diseases specific to muscles, joints, and bones.   Blood vessel disease where not enough blood is getting to the muscles.  HOME CARE INSTRUCTIONS   Stay well hydrated. Drink enough water and fluids to keep your urine clear or pale yellow.  It may be helpful to massage, stretch, and  relax the affected muscle.  For tight or tense muscles, use a warm towel, heating pad, or hot shower water directed to the affected area.  If you are sore or have pain after a cramp or spasm, applying ice to the affected area may relieve discomfort.  Put ice in a plastic bag.  Place a towel between your skin and the bag.  Leave the ice on for 15-20 minutes, 03-04 times a day.  Medicines used to treat a known cause of cramps or spasms may help reduce their frequency or severity. Only take over-the-counter or prescription medicines as directed by your caregiver. SEEK MEDICAL CARE IF:  Your cramps or spasms get more severe, more frequent, or do not improve over time.  MAKE SURE YOU:   Understand these instructions.  Will watch your condition.  Will get help right away if you are not doing well or get worse. Document Released: 06/10/2001 Document Revised: 04/15/2012 Document Reviewed: 12/06/2011 [facility] Patient Information 2015 [place], [place]. This information is not intended to replace advice given to you by your health care provider. Make sure you discuss any questions you have with your health care provider.

## 2014-04-29 NOTE — Progress Notes (Signed)
   Subjective:    Patient ID: Shane Jones, male    DOB: [birth year], 69 y.o.   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 Negative for fever, chills and diaphoresis.  Eyes: Negative for visual disturbance.  Respiratory: Negative for cough, chest tightness, shortness of breath and wheezing.   Cardiovascular: Negative for chest pain, palpitations and leg swelling.  Gastrointestinal: Negative for nausea, vomiting and diarrhea.  Musculoskeletal: Positive for myalgias.  Skin: Negative for rash.  Psychiatric/Behavioral: Positive for sleep disturbance.       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 He appears well-developed and well-nourished. No distress.  BP 122/72 mmHg  Pulse 67  Temp(Src) 97.7 F (36.5 C) (Oral)  Resp 14  Ht 5' 5.25" (1.657 m)  Wt 205 lb 12.8 oz (93.35 kg)  BMI 34.00 kg/m2  SpO2 98%   HENT:  Head: Normocephalic and atraumatic.  Right Ear: External ear normal.  Left Ear: External ear normal.  Eyes: Right eye exhibits no discharge. Left eye exhibits no discharge. No scleral icterus.  Neck: Normal range of motion. Neck supple.  Cardiovascular: Normal rate, regular rhythm, normal heart sounds and intact distal pulses.  Exam reveals no gallop and no friction rub.   No murmur heard. Pulmonary/Chest: Effort normal and breath sounds normal. No respiratory distress. He has no wheezes. He has no rales. He exhibits no tenderness.  Musculoskeletal:    Pitting edema 2+ lower extremities (pt reports it is improved from previous)  Lymphadenopathy:    He has no cervical adenopathy.  Neurological: He is alert and oriented to person, place, and time.  Skin: Skin is warm and dry. No rash noted. He is not diaphoretic.  Psychiatric: He has a normal mood and affect. His behavior is normal. Judgment and thought content normal.      Assessment & Plan:

## 2014-04-29 NOTE — Assessment & Plan Note (Signed)
Uncontrolled. Will obtain CMET and magnesium levels today. Pt recently exhausted and on Lasix daily despite warning that this could be hazardous. Talked with pt about decreasing to every other day, staying hydrated, taking his medications as directed, demonstrated stretches, gave hand out with home care remedies. Will follow.

## 2014-05-12 ENCOUNTER — Other Ambulatory Visit: Payer: Self-pay | Admitting: *Deleted

## 2014-05-12 DIAGNOSIS — M7989 Other specified soft tissue disorders: Secondary | ICD-10-CM

## 2014-05-12 MED ORDER — AMLODIPINE BESYLATE 10 MG PO TABS: ORAL_TABLET | ORAL | Status: DC

## 2014-05-12 MED ORDER — ATORVASTATIN CALCIUM 20 MG PO TABS: ORAL_TABLET | ORAL | Status: DC

## 2014-05-12 MED ORDER — AMLODIPINE BESYLATE 10 MG PO TABS
ORAL_TABLET | ORAL | Status: DC
Start: 2014-05-12 — End: 2014-06-29

## 2014-05-12 MED ORDER — NABUMETONE 500 MG PO TABS: ORAL_TABLET | ORAL | Status: DC

## 2014-05-12 MED ORDER — GABAPENTIN 400 MG PO CAPS: 400.0000 mg | ORAL_CAPSULE | Freq: Three times a day (TID) | ORAL | Status: DC

## 2014-05-12 MED ORDER — LOSARTAN POTASSIUM 100 MG PO TABS: ORAL_TABLET | ORAL | Status: DC

## 2014-05-12 MED ORDER — METOPROLOL SUCCINATE ER 100 MG PO TB24: ORAL_TABLET | ORAL | Status: DC

## 2014-05-12 MED ORDER — ALLOPURINOL 100 MG PO TABS: ORAL_TABLET | ORAL | Status: DC

## 2014-05-12 MED ORDER — GLIPIZIDE-METFORMIN HCL 2.5-500 MG PO TABS: ORAL_TABLET | ORAL | Status: DC

## 2014-05-12 MED ORDER — FUROSEMIDE 20 MG PO TABS: 20.0000 mg | ORAL_TABLET | Freq: Every day | ORAL | Status: DC

## 2014-06-29 ENCOUNTER — Ambulatory Visit (INDEPENDENT_AMBULATORY_CARE_PROVIDER_SITE_OTHER): Payer: Medicare Other | Admitting: Internal Medicine

## 2014-06-29 ENCOUNTER — Encounter: Payer: Self-pay | Admitting: Internal Medicine

## 2014-06-29 VITALS — BP 131/79 | HR 62 | Temp 98.2°F | Ht 65.25 in | Wt 203.4 lb

## 2014-06-29 DIAGNOSIS — E1165 Type 2 diabetes mellitus with hyperglycemia: Secondary | ICD-10-CM

## 2014-06-29 DIAGNOSIS — E1140 Type 2 diabetes mellitus with diabetic neuropathy, unspecified: Secondary | ICD-10-CM | POA: Diagnosis not present

## 2014-06-29 DIAGNOSIS — E785 Hyperlipidemia, unspecified: Secondary | ICD-10-CM

## 2014-06-29 DIAGNOSIS — M7989 Other specified soft tissue disorders: Secondary | ICD-10-CM

## 2014-06-29 DIAGNOSIS — I10 Essential (primary) hypertension: Secondary | ICD-10-CM

## 2014-06-29 DIAGNOSIS — IMO0002 Reserved for concepts with insufficient information to code with codable children: Secondary | ICD-10-CM

## 2014-06-29 LAB — COMPREHENSIVE METABOLIC PANEL
ALK PHOS: 68 U/L (ref 39–117)
ALT: 23 U/L (ref 0–53)
AST: 21 U/L (ref 0–37)
Albumin: 4.5 g/dL (ref 3.5–5.2)
BILIRUBIN TOTAL: 0.7 mg/dL (ref 0.2–1.2)
BUN: 17 mg/dL (ref 6–23)
CO2: 32 meq/L (ref 19–32)
CREATININE: 1.26 mg/dL (ref 0.40–1.50)
Calcium: 9.9 mg/dL (ref 8.4–10.5)
Chloride: 104 mEq/L (ref 96–112)
GFR: 59.92 mL/min — ABNORMAL LOW (ref >60.00)
Glucose, Bld: 165 mg/dL — ABNORMAL HIGH (ref 70–99)
Potassium: 4.2 mEq/L (ref 3.5–5.1)
Sodium: 142 mEq/L (ref 135–145)
Total Protein: 7.6 g/dL (ref 6.0–8.3)

## 2014-06-29 LAB — MICROALBUMIN / CREATININE URINE RATIO
Creatinine,U: 171 mg/dL
Microalb Creat Ratio: 52.2 mg/g — ABNORMAL HIGH (ref 0.0–30.0)
Microalb, Ur: 89.2 mg/dL — ABNORMAL HIGH (ref 0.0–1.9)

## 2014-06-29 LAB — LIPID PANEL
Cholesterol: 138 mg/dL (ref 0–200)
HDL: 31.9 mg/dL — ABNORMAL LOW (ref >39.00)
NonHDL: 106.1
Total CHOL/HDL Ratio: 4
Triglycerides: 349 mg/dL — ABNORMAL HIGH (ref 0.0–149.0)
VLDL: 69.8 mg/dL — AB (ref 0.0–40.0)

## 2014-06-29 LAB — HEMOGLOBIN A1C: HEMOGLOBIN A1C: 6.6 % — AB (ref 4.6–6.5)

## 2014-06-29 LAB — LDL CHOLESTEROL, DIRECT: Direct LDL: 66 mg/dL

## 2014-06-29 MED ORDER — AMLODIPINE BESYLATE 10 MG PO TABS: ORAL_TABLET | ORAL | Status: DC

## 2014-06-29 MED ORDER — GABAPENTIN 400 MG PO CAPS: 400.0000 mg | ORAL_CAPSULE | Freq: Three times a day (TID) | ORAL | Status: DC

## 2014-06-29 MED ORDER — ATORVASTATIN CALCIUM 20 MG PO TABS: ORAL_TABLET | ORAL | Status: DC

## 2014-06-29 MED ORDER — NABUMETONE 500 MG PO TABS: ORAL_TABLET | ORAL | Status: DC

## 2014-06-29 MED ORDER — LOSARTAN POTASSIUM 100 MG PO TABS: ORAL_TABLET | ORAL | Status: DC

## 2014-06-29 MED ORDER — ALLOPURINOL 100 MG PO TABS: ORAL_TABLET | ORAL | Status: DC

## 2014-06-29 MED ORDER — FUROSEMIDE 20 MG PO TABS: 20.0000 mg | ORAL_TABLET | Freq: Every day | ORAL | Status: DC

## 2014-06-29 MED ORDER — METOPROLOL SUCCINATE ER 100 MG PO TB24: ORAL_TABLET | ORAL | Status: DC

## 2014-06-29 MED ORDER — GLIPIZIDE-METFORMIN HCL 2.5-500 MG PO TABS: ORAL_TABLET | ORAL | Status: DC

## 2014-06-29 NOTE — Patient Instructions (Signed)
Labs today.   Follow up in 3 months.  

## 2014-06-29 NOTE — Assessment & Plan Note (Signed)
Will check lipids and lfts with labs. Continue Atorvastatin.

## 2014-06-29 NOTE — Assessment & Plan Note (Signed)
BP Readings from Last 3 Encounters:  06/29/14 131/79  04/29/14 122/72  03/31/14 131/79   BP well controlled. Renal function with labs today. Continue current medications.

## 2014-06-29 NOTE — Assessment & Plan Note (Signed)
Recheck A1c with labs today. Continue current medications.

## 2014-06-29 NOTE — Progress Notes (Signed)
Pre visit review using our clinic review tool, if applicable. No additional management support is needed unless otherwise documented below in the visit note. 

## 2014-06-29 NOTE — Progress Notes (Signed)
Subjective:    Patient ID: Shane Jones, male    DOB: 1945/08/04, 69 y.o.   MRN: [id number]  HPI  69YO male presents for follow up.  DM - BG have been well controlled. Did not bring record today.  HTN - Compliant with medications. No CP, HA.  BP Readings from Last 3 Encounters:  06/29/14 131/79  04/29/14 122/72  03/31/14 131/79     Wt Readings from Last 3 Encounters:  06/29/14 203 lb 6 oz (92.25 kg)  04/29/14 205 lb 12.8 oz (93.35 kg)  03/31/14 202 lb 8 oz (91.853 kg)     Past medical, surgical, family and social history per today's encounter.  Review of Systems  Constitutional: Negative for fever, chills, activity change, appetite change, fatigue and unexpected weight change.  Eyes: Negative for visual disturbance.  Respiratory: Negative for cough and shortness of breath.   Cardiovascular: Negative for chest pain, palpitations and leg swelling.  Gastrointestinal: Negative for nausea, vomiting, abdominal pain, diarrhea, constipation and abdominal distention.  Genitourinary: Negative for dysuria, urgency and difficulty urinating.  Musculoskeletal: Negative for arthralgias and gait problem.  Skin: Negative for color change and rash.  Hematological: Negative for adenopathy.  Psychiatric/Behavioral: Negative for sleep disturbance and dysphoric mood. The patient is not nervous/anxious.        Objective:    BP 131/79 mmHg  Pulse 62  Temp(Src) 98.2 F (36.8 C) (Oral)  Ht 5' 5.25" (1.657 m)  Wt 203 lb 6 oz (92.25 kg)  BMI 33.60 kg/m2  SpO2 98% Physical Exam  Constitutional: He is oriented to person, place, and time. He appears well-developed and well-nourished. No distress.  HENT:  Head: Normocephalic and atraumatic.  Right Ear: External ear normal.  Left Ear: External ear normal.  Nose: Nose normal.  Mouth/Throat: Oropharynx is clear and moist. No oropharyngeal exudate.  Eyes: Conjunctivae and EOM are normal. Pupils are equal, round, and reactive to light. Right  eye exhibits no discharge. Left eye exhibits no discharge. No scleral icterus.  Neck: Normal range of motion. Neck supple. No tracheal deviation present. No thyromegaly present.  Cardiovascular: Normal rate, regular rhythm and normal heart sounds.  Exam reveals no gallop and no friction rub.   No murmur heard. Pulmonary/Chest: Effort normal and breath sounds normal. No accessory muscle usage. No tachypnea. No respiratory distress. He has no decreased breath sounds. He has no wheezes. He has no rhonchi. He has no rales. He exhibits no tenderness.  Musculoskeletal: Normal range of motion. He exhibits no edema.  Lymphadenopathy:    He has no cervical adenopathy.  Neurological: He is alert and oriented to person, place, and time. No cranial nerve deficit. Coordination normal.  Skin: Skin is warm and dry. No rash noted. He is not diaphoretic. No erythema. No pallor.  Psychiatric: He has a normal mood and affect. His behavior is normal. Judgment and thought content normal.          Assessment & Plan:   Problem List Items Addressed This Visit      Unprioritized   Hyperlipidemia    Will check lipids and lfts with labs. Continue Atorvastatin.      Relevant Medications   amLODipine (NORVASC) 10 MG tablet   atorvastatin (LIPITOR) 20 MG tablet   furosemide (LASIX) 20 MG tablet   losartan (COZAAR) 100 MG tablet   metoprolol succinate (TOPROL-XL) 100 MG 24 hr tablet   Hypertension    BP Readings from Last 3 Encounters:  06/29/14 131/79  04/29/14 122/72  03/31/14 131/79   BP well controlled. Renal function with labs today. Continue current medications.      Relevant Medications   amLODipine (NORVASC) 10 MG tablet   atorvastatin (LIPITOR) 20 MG tablet   furosemide (LASIX) 20 MG tablet   losartan (COZAAR) 100 MG tablet   metoprolol succinate (TOPROL-XL) 100 MG 24 hr tablet   Leg swelling   Relevant Medications   furosemide (LASIX) 20 MG tablet   Type 2 diabetes, uncontrolled, with  neuropathy - Primary    Recheck A1c with labs today. Continue current medications.      Relevant Medications   atorvastatin (LIPITOR) 20 MG tablet   glipiZIDE-metformin (METAGLIP) 2.5-500 MG per tablet   losartan (COZAAR) 100 MG tablet   Other Relevant Orders   Comprehensive metabolic panel   Hemoglobin A1c   Lipid panel   Microalbumin / creatinine urine ratio       Return for Recheck of Diabetes.

## 2014-09-28 ENCOUNTER — Encounter: Payer: Self-pay | Admitting: Internal Medicine

## 2014-09-28 ENCOUNTER — Ambulatory Visit (INDEPENDENT_AMBULATORY_CARE_PROVIDER_SITE_OTHER): Payer: Medicare Other | Admitting: Internal Medicine

## 2014-09-28 VITALS — BP 146/90 | HR 69 | Temp 98.1°F | Ht 65.25 in | Wt 203.5 lb

## 2014-09-28 DIAGNOSIS — E1140 Type 2 diabetes mellitus with diabetic neuropathy, unspecified: Secondary | ICD-10-CM

## 2014-09-28 DIAGNOSIS — E1165 Type 2 diabetes mellitus with hyperglycemia: Secondary | ICD-10-CM

## 2014-09-28 DIAGNOSIS — I10 Essential (primary) hypertension: Secondary | ICD-10-CM | POA: Diagnosis not present

## 2014-09-28 DIAGNOSIS — Z23 Encounter for immunization: Secondary | ICD-10-CM

## 2014-09-28 DIAGNOSIS — IMO0002 Reserved for concepts with insufficient information to code with codable children: Secondary | ICD-10-CM

## 2014-09-28 DIAGNOSIS — E785 Hyperlipidemia, unspecified: Secondary | ICD-10-CM

## 2014-09-28 LAB — COMPREHENSIVE METABOLIC PANEL
ALBUMIN: 4.3 g/dL (ref 3.5–5.2)
ALT: 27 U/L (ref 0–53)
AST: 22 U/L (ref 0–37)
Alkaline Phosphatase: 75 U/L (ref 39–117)
BUN: 16 mg/dL (ref 6–23)
CHLORIDE: 102 meq/L (ref 96–112)
CO2: 28 meq/L (ref 19–32)
CREATININE: 1.14 mg/dL (ref 0.40–1.50)
Calcium: 9.8 mg/dL (ref 8.4–10.5)
GFR: 67.2 mL/min (ref >60.00)
Glucose, Bld: 313 mg/dL — ABNORMAL HIGH (ref 70–99)
Potassium: 4.3 mEq/L (ref 3.5–5.1)
Sodium: 141 mEq/L (ref 135–145)
Total Bilirubin: 0.7 mg/dL (ref 0.2–1.2)
Total Protein: 7.2 g/dL (ref 6.0–8.3)

## 2014-09-28 LAB — LIPID PANEL
CHOL/HDL RATIO: 5
Cholesterol: 144 mg/dL (ref 0–200)
HDL: 30.5 mg/dL — AB (ref >39.00)

## 2014-09-28 LAB — MICROALBUMIN / CREATININE URINE RATIO
Creatinine,U: 103.7 mg/dL
MICROALB/CREAT RATIO: 117.4 mg/g — AB (ref 0.0–30.0)
Microalb, Ur: 121.8 mg/dL — ABNORMAL HIGH (ref 0.0–1.9)

## 2014-09-28 LAB — HEMOGLOBIN A1C: HEMOGLOBIN A1C: 8.6 % — AB (ref 4.6–6.5)

## 2014-09-28 LAB — LDL CHOLESTEROL, DIRECT: Direct LDL: 34 mg/dL

## 2014-09-28 NOTE — Assessment & Plan Note (Signed)
Will check lipids and LFTs with labs. Continue Atorvastatin. 

## 2014-09-28 NOTE — Progress Notes (Signed)
Pre visit review using our clinic review tool, if applicable. No additional management support is needed unless otherwise documented below in the visit note. 

## 2014-09-28 NOTE — Assessment & Plan Note (Signed)
BP Readings from Last 3 Encounters:  09/28/14 146/90  06/29/14 131/79  04/29/14 122/72   BP elevated. Repeat check by me today 142/88. Will have him monitor at home/work and follow up if consistently >140/90

## 2014-09-28 NOTE — Patient Instructions (Signed)
Labs today.   Follow up in 3 months.  

## 2014-09-28 NOTE — Assessment & Plan Note (Signed)
Will check A1c with labs. Continue current medications. 

## 2014-09-28 NOTE — Progress Notes (Signed)
Subjective:    Patient ID: Shane Jones, male    DOB: Oct 05, 1945, 69 y.o.   MRN: [id number]  HPI  69YO male presents for follow up.  DM - BG well controlled. No BG near 200. Compliant with medications.  HTN - BP at home well controlled. 110s/60-70s. Compliant with medications.  Wt Readings from Last 3 Encounters:  09/28/14 203 lb 8 oz (92.307 kg)  06/29/14 203 lb 6 oz (92.25 kg)  04/29/14 205 lb 12.8 oz (93.35 kg)   BP Readings from Last 3 Encounters:  09/28/14 146/90  06/29/14 131/79  04/29/14 122/72    Past Medical History  Diagnosis Date  . Gout   . Hypertension    No family history on file. No past surgical history on file. Social History   Social History  . Marital Status: Married    Spouse Name: N/A  . Number of Children: N/A  . Years of Education: N/A   Social History Main Topics  . Smoking status: Former Smoker    Quit date: 11/20/1978  . Smokeless tobacco: Never Used  . Alcohol Use: No  . Drug Use: No  . Sexual Activity: Not Asked   Other Topics Concern  . None   Social History Narrative    Review of Systems  Constitutional: Negative for fever, chills, activity change, appetite change, fatigue and unexpected weight change.  Eyes: Negative for visual disturbance.  Respiratory: Negative for cough and shortness of breath.   Cardiovascular: Negative for chest pain, palpitations and leg swelling.  Gastrointestinal: Negative for nausea, vomiting, abdominal pain, diarrhea, constipation and abdominal distention.  Genitourinary: Negative for dysuria, urgency and difficulty urinating.  Musculoskeletal: Negative for arthralgias and gait problem.  Skin: Negative for color change and rash.  Hematological: Negative for adenopathy.  Psychiatric/Behavioral: Negative for sleep disturbance and dysphoric mood. The patient is not nervous/anxious.        Objective:    BP 146/90 mmHg  Pulse 69  Temp(Src) 98.1 F (36.7 C) (Oral)  Ht 5' 5.25" (1.657 m)  Wt  203 lb 8 oz (92.307 kg)  BMI 33.62 kg/m2  SpO2 98% Physical Exam  Constitutional: He is oriented to person, place, and time. He appears well-developed and well-nourished. No distress.  HENT:  Head: Normocephalic and atraumatic.  Right Ear: External ear normal.  Left Ear: External ear normal.  Nose: Nose normal.  Mouth/Throat: Oropharynx is clear and moist. No oropharyngeal exudate.  Eyes: Conjunctivae and EOM are normal. Pupils are equal, round, and reactive to light. Right eye exhibits no discharge. Left eye exhibits no discharge. No scleral icterus.  Neck: Normal range of motion. Neck supple. No tracheal deviation present. No thyromegaly present.  Cardiovascular: Normal rate, regular rhythm and normal heart sounds.  Exam reveals no gallop and no friction rub.   No murmur heard. Pulmonary/Chest: Effort normal and breath sounds normal. No accessory muscle usage. No tachypnea. No respiratory distress. He has no decreased breath sounds. He has no wheezes. He has no rhonchi. He has no rales. He exhibits no tenderness.  Musculoskeletal: Normal range of motion. He exhibits no edema.  Lymphadenopathy:    He has no cervical adenopathy.  Neurological: He is alert and oriented to person, place, and time. No cranial nerve deficit. Coordination normal.  Skin: Skin is warm and dry. No rash noted. He is not diaphoretic. No erythema. No pallor.  Psychiatric: He has a normal mood and affect. His behavior is normal. Judgment and thought content normal.  Assessment & Plan:   Problem List Items Addressed This Visit      Unprioritized   Hyperlipidemia    Will check lipids and LFTs with labs. Continue Atorvastatin.      Hypertension    BP Readings from Last 3 Encounters:  09/28/14 146/90  06/29/14 131/79  04/29/14 122/72   BP elevated. Repeat check by me today 142/88. Will have him monitor at home/work and follow up if consistently >140/90      Type 2 diabetes, uncontrolled, with  neuropathy - Primary    Will check A1c with labs. Continue current medications.      Relevant Orders   Comprehensive metabolic panel   Hemoglobin A1c   Lipid panel   Microalbumin / creatinine urine ratio       Return in about 3 months (around 12/28/2014) for Recheck of Diabetes.

## 2014-10-16 ENCOUNTER — Encounter: Payer: Self-pay | Admitting: Internal Medicine

## 2014-10-16 ENCOUNTER — Ambulatory Visit (INDEPENDENT_AMBULATORY_CARE_PROVIDER_SITE_OTHER): Payer: Medicare Other | Admitting: Internal Medicine

## 2014-10-16 VITALS — BP 155/88 | HR 65 | Temp 98.0°F | Ht 65.0 in | Wt 201.2 lb

## 2014-10-16 DIAGNOSIS — E1165 Type 2 diabetes mellitus with hyperglycemia: Secondary | ICD-10-CM

## 2014-10-16 DIAGNOSIS — IMO0002 Reserved for concepts with insufficient information to code with codable children: Secondary | ICD-10-CM

## 2014-10-16 DIAGNOSIS — G629 Polyneuropathy, unspecified: Secondary | ICD-10-CM | POA: Diagnosis not present

## 2014-10-16 DIAGNOSIS — E1140 Type 2 diabetes mellitus with diabetic neuropathy, unspecified: Secondary | ICD-10-CM | POA: Diagnosis not present

## 2014-10-16 MED ORDER — SITAGLIPTIN PHOSPHATE 25 MG PO TABS: 25.0000 mg | ORAL_TABLET | Freq: Every day | ORAL | Status: DC

## 2014-10-16 NOTE — Progress Notes (Signed)
Pre visit review using our clinic review tool, if applicable. No additional management support is needed unless otherwise documented below in the visit note. 

## 2014-10-16 NOTE — Patient Instructions (Signed)
Start Januvia 25mg  daily.  Continue Glipizide-Metformin.  Monitor blood sugars 2-3 times daily.  Follow up with repeat labs in 3 months.

## 2014-10-16 NOTE — Assessment & Plan Note (Signed)
Symptoms well controlled with Neurontin. Will continue.

## 2014-10-16 NOTE — Progress Notes (Signed)
Subjective:    Patient ID: Shane Jones, male    DOB: [birth year], 69 y.o.   MRN: [id number]  HPI  69YO male presents for follow up.  Last seen 9/26. Blood sugars noted to be elevated with A1c 8.6%. Marked increase from 6.6% on previous check.  Notes some dietary indiscretion. Recent BG much improved near upper 100s. Would like to wait one month before changing medications. Working on improving diet.  No current symptoms of pain. Symptoms have been well controlled with Neurontin. No side effects from medication.    Wt Readings from Last 3 Encounters:  10/16/14 201 lb 4 oz (91.286 kg)  09/28/14 203 lb 8 oz (92.307 kg)  06/29/14 203 lb 6 oz (92.25 kg)   BP Readings from Last 3 Encounters:  10/16/14 155/88  09/28/14 146/90  06/29/14 131/79    Past Medical History  Diagnosis Date  . Gout   . Hypertension    No family history on file. No past surgical history on file. Social History   Social History  . Marital Status: Married    Spouse Name: N/A  . Number of Children: N/A  . Years of Education: N/A   Social History Main Topics  . Smoking status: Former Smoker    Quit date: 11/20/1978  . Smokeless tobacco: Never Used  . Alcohol Use: No  . Drug Use: No  . Sexual Activity: Not Asked   Other Topics Concern  . None   Social History Narrative    Review of Systems  Constitutional: Negative for fever, chills, activity change, appetite change, fatigue and unexpected weight change.  Eyes: Negative for visual disturbance.  Respiratory: Negative for cough and shortness of breath.   Cardiovascular: Negative for chest pain, palpitations and leg swelling.  Gastrointestinal: Negative for nausea, vomiting, abdominal pain, diarrhea, constipation and abdominal distention.  Genitourinary: Negative for dysuria, urgency and difficulty urinating.  Musculoskeletal: Negative for arthralgias and gait problem.  Skin: Negative for color change and rash.  Hematological: Negative  for adenopathy.  Psychiatric/Behavioral: Negative for sleep disturbance and dysphoric mood. The patient is not nervous/anxious.        Objective:    BP 155/88 mmHg  Pulse 65  Temp(Src) 98 F (36.7 C) (Oral)  Ht 5\' 5"  (1.651 m)  Wt 201 lb 4 oz (91.286 kg)  BMI 33.49 kg/m2  SpO2 99% Physical Exam  Constitutional: He is oriented to person, place, and time. He appears well-developed and well-nourished. No distress.  HENT:  Head: Normocephalic and atraumatic.  Right Ear: External ear normal.  Left Ear: External ear normal.  Nose: Nose normal.  Mouth/Throat: Oropharynx is clear and moist. No oropharyngeal exudate.  Eyes: Conjunctivae and EOM are normal. Pupils are equal, round, and reactive to light. Right eye exhibits no discharge. Left eye exhibits no discharge. No scleral icterus.  Neck: Normal range of motion. Neck supple. No tracheal deviation present. No thyromegaly present.  Cardiovascular: Normal rate, regular rhythm and normal heart sounds.  Exam reveals no gallop and no friction rub.   No murmur heard. Pulmonary/Chest: Effort normal and breath sounds normal. No accessory muscle usage. No tachypnea. No respiratory distress. He has no decreased breath sounds. He has no wheezes. He has no rhonchi. He has no rales. He exhibits no tenderness.  Musculoskeletal: Normal range of motion. He exhibits no edema.  Lymphadenopathy:    He has no cervical adenopathy.  Neurological: He is alert and oriented to person, place, and time. No cranial nerve deficit. Coordination  normal.  Skin: Skin is warm and dry. No rash noted. He is not diaphoretic. No erythema. No pallor.  Psychiatric: He has a normal mood and affect. His behavior is normal. Judgment and thought content normal.          Assessment & Plan:   Problem List Items Addressed This Visit      Unprioritized   Neuropathy ([place])    Symptoms well controlled with Neurontin. Will continue.      Type 2 diabetes, uncontrolled, with  neuropathy ([place]) - Primary    Very poor control of BG recently. He does not want to try insulin. Will add Januvia 25mg  daily. Discussed potential risks of this medication. Follow up repeat A1c in 3 months.      Relevant Medications   sitaGLIPtin (JANUVIA) 25 MG tablet       Return in about 3 months (around 01/16/2015) for Recheck of Diabetes.

## 2014-10-16 NOTE — Assessment & Plan Note (Signed)
Very poor control of BG recently. He does not want to try insulin. Will add Januvia 25mg  daily. Discussed potential risks of this medication. Follow up repeat A1c in 3 months.

## 2014-11-24 ENCOUNTER — Telehealth: Payer: Self-pay | Admitting: Internal Medicine

## 2014-11-24 NOTE — Telephone Encounter (Signed)
glucose blood (TRUETRACK TEST) test strip

## 2014-11-25 ENCOUNTER — Other Ambulatory Visit: Payer: Self-pay

## 2014-12-02 ENCOUNTER — Other Ambulatory Visit: Payer: Self-pay

## 2014-12-02 MED ORDER — GLUCOSE BLOOD VI STRP: ORAL_STRIP | Status: DC

## 2014-12-02 NOTE — Telephone Encounter (Signed)
Done

## 2014-12-10 ENCOUNTER — Encounter: Payer: Self-pay | Admitting: Internal Medicine

## 2014-12-10 ENCOUNTER — Ambulatory Visit (INDEPENDENT_AMBULATORY_CARE_PROVIDER_SITE_OTHER): Payer: Medicare Other | Admitting: Internal Medicine

## 2014-12-10 VITALS — BP 160/91 | HR 70 | Temp 98.6°F | Ht 65.0 in | Wt 204.2 lb

## 2014-12-10 DIAGNOSIS — E1165 Type 2 diabetes mellitus with hyperglycemia: Secondary | ICD-10-CM

## 2014-12-10 DIAGNOSIS — I10 Essential (primary) hypertension: Secondary | ICD-10-CM | POA: Diagnosis not present

## 2014-12-10 DIAGNOSIS — E1140 Type 2 diabetes mellitus with diabetic neuropathy, unspecified: Secondary | ICD-10-CM

## 2014-12-10 DIAGNOSIS — IMO0002 Reserved for concepts with insufficient information to code with codable children: Secondary | ICD-10-CM

## 2014-12-10 MED ORDER — GLIPIZIDE-METFORMIN HCL 2.5-500 MG PO TABS: 1.0000 | ORAL_TABLET | Freq: Two times a day (BID) | ORAL | Status: DC

## 2014-12-10 NOTE — Assessment & Plan Note (Signed)
BG reportedly improved. Will continue current meds. Recheck A1c in 2 months.

## 2014-12-10 NOTE — Assessment & Plan Note (Signed)
BP Readings from Last 3 Encounters:  12/10/14 160/91  10/16/14 155/88  09/28/14 146/90   BP elevated. For now, will continue current medication and recheck BP at follow up in 2 months.

## 2014-12-10 NOTE — Patient Instructions (Addendum)
Continue efforts at healthy diet.   Continue current medications.  Labs prior to visit.  Follow up in 1 month.

## 2014-12-10 NOTE — Progress Notes (Signed)
Subjective:    Patient ID: Shane Jones, male    DOB: September 30, 1945, 69 y.o.   MRN: [id number]  HPI  69YO male presents for follow up.  Last visit, blood sugars noted to be elevated with A1c 8.6%.   DM - Has been working on [employer]. Increasing intake of green veggies. Taking medication, glipizide, however not taking Januvia. Worried about cost of this medication. BG have been improved, near 100-150. 132 yesterday morning. Also started taking apple vinegar and garlic.  HTN - Compliant with medications. No CP, HA, palpitations.  Wt Readings from Last 3 Encounters:  12/10/14 204 lb 4 oz (92.647 kg)  10/16/14 201 lb 4 oz (91.286 kg)  09/28/14 203 lb 8 oz (92.307 kg)   BP Readings from Last 3 Encounters:  12/10/14 160/91  10/16/14 155/88  09/28/14 146/90    Past Medical History  Diagnosis Date  . Gout   . Hypertension    No family history on file. No past surgical history on file. Social History   Social History  . Marital Status: Married    Spouse Name: N/A  . Number of Children: N/A  . Years of Education: N/A   Social History Main Topics  . Smoking status: Former Smoker    Quit date: 11/20/1978  . Smokeless tobacco: Never Used  . Alcohol Use: No  . Drug Use: No  . Sexual Activity: Not Asked   Other Topics Concern  . None   Social History Narrative    Review of Systems  Constitutional: Negative for fever, chills, activity change, appetite change, fatigue and unexpected weight change.  Eyes: Negative for visual disturbance.  Respiratory: Negative for cough and shortness of breath.   Cardiovascular: Negative for chest pain, palpitations and leg swelling.  Gastrointestinal: Negative for nausea, vomiting, abdominal pain, diarrhea, constipation and abdominal distention.  Genitourinary: Negative for dysuria, urgency and difficulty urinating.  Musculoskeletal: Negative for arthralgias and gait problem.  Skin: Negative for color change and rash.  Hematological:  Negative for adenopathy.  Psychiatric/Behavioral: Negative for sleep disturbance and dysphoric mood. The patient is not nervous/anxious.        Objective:    BP 160/91 mmHg  Pulse 70  Temp(Src) 98.6 F (37 C) (Oral)  Ht 5\' 5"  (1.651 m)  Wt 204 lb 4 oz (92.647 kg)  BMI 33.99 kg/m2  SpO2 98% Physical Exam  Constitutional: He is oriented to person, place, and time. He appears well-developed and well-nourished. No distress.  HENT:  Head: Normocephalic and atraumatic.  Right Ear: External ear normal.  Left Ear: External ear normal.  Nose: Nose normal.  Mouth/Throat: Oropharynx is clear and moist. No oropharyngeal exudate.  Eyes: Conjunctivae and EOM are normal. Pupils are equal, round, and reactive to light. Right eye exhibits no discharge. Left eye exhibits no discharge. No scleral icterus.  Neck: Normal range of motion. Neck supple. No tracheal deviation present. No thyromegaly present.  Cardiovascular: Normal rate, regular rhythm and normal heart sounds.  Exam reveals no gallop and no friction rub.   No murmur heard. Pulmonary/Chest: Effort normal and breath sounds normal. No accessory muscle usage. No tachypnea. No respiratory distress. He has no decreased breath sounds. He has no wheezes. He has no rhonchi. He has no rales. He exhibits no tenderness.  Musculoskeletal: Normal range of motion. He exhibits no edema.  Lymphadenopathy:    He has no cervical adenopathy.  Neurological: He is alert and oriented to person, place, and time. No cranial nerve deficit.  Coordination normal.  Skin: Skin is warm and dry. No rash noted. He is not diaphoretic. No erythema. No pallor.  Psychiatric: He has a normal mood and affect. His behavior is normal. Judgment and thought content normal.          Assessment & Plan:   Problem List Items Addressed This Visit      Unprioritized   Hypertension   Type 2 diabetes, uncontrolled, with neuropathy ([place]) - Primary       No Follow-up on  file.

## 2014-12-30 ENCOUNTER — Encounter: Payer: Self-pay | Admitting: *Deleted

## 2015-01-06 ENCOUNTER — Other Ambulatory Visit (INDEPENDENT_AMBULATORY_CARE_PROVIDER_SITE_OTHER): Payer: Medicare Other

## 2015-01-06 ENCOUNTER — Other Ambulatory Visit: Payer: Medicare Other

## 2015-01-06 DIAGNOSIS — E1140 Type 2 diabetes mellitus with diabetic neuropathy, unspecified: Secondary | ICD-10-CM

## 2015-01-06 DIAGNOSIS — E1165 Type 2 diabetes mellitus with hyperglycemia: Secondary | ICD-10-CM

## 2015-01-06 DIAGNOSIS — IMO0002 Reserved for concepts with insufficient information to code with codable children: Secondary | ICD-10-CM

## 2015-01-06 LAB — COMPREHENSIVE METABOLIC PANEL
ALT: 26 U/L (ref 0–53)
AST: 22 U/L (ref 0–37)
Albumin: 4.4 g/dL (ref 3.5–5.2)
Alkaline Phosphatase: 59 U/L (ref 39–117)
BUN: 20 mg/dL (ref 6–23)
CO2: 30 mEq/L (ref 19–32)
Calcium: 9.9 mg/dL (ref 8.4–10.5)
Chloride: 104 mEq/L (ref 96–112)
Creatinine, Ser: 1.33 mg/dL (ref 0.40–1.50)
GFR: 56.21 mL/min — ABNORMAL LOW (ref >60.00)
GLUCOSE: 163 mg/dL — AB (ref 70–99)
Potassium: 4.8 mEq/L (ref 3.5–5.1)
SODIUM: 143 meq/L (ref 135–145)
Total Bilirubin: 0.5 mg/dL (ref 0.2–1.2)
Total Protein: 7.4 g/dL (ref 6.0–8.3)

## 2015-01-06 LAB — HEMOGLOBIN A1C: Hgb A1c MFr Bld: 6.7 % — ABNORMAL HIGH (ref 4.6–6.5)

## 2015-01-07 ENCOUNTER — Encounter: Payer: Self-pay | Admitting: *Deleted

## 2015-01-07 ENCOUNTER — Other Ambulatory Visit: Payer: Self-pay | Admitting: Internal Medicine

## 2015-01-07 MED ORDER — GLUCOSE BLOOD VI STRP: ORAL_STRIP | Status: DC

## 2015-01-07 NOTE — Telephone Encounter (Signed)
Needed clarification.

## 2015-01-11 ENCOUNTER — Ambulatory Visit: Payer: Medicare Other | Admitting: Internal Medicine

## 2015-01-22 ENCOUNTER — Encounter: Payer: Self-pay | Admitting: Internal Medicine

## 2015-01-22 ENCOUNTER — Ambulatory Visit (INDEPENDENT_AMBULATORY_CARE_PROVIDER_SITE_OTHER): Payer: Medicare Other | Admitting: Internal Medicine

## 2015-01-22 VITALS — BP 118/72 | HR 90 | Temp 98.3°F | Resp 20 | Wt 202.0 lb

## 2015-01-22 DIAGNOSIS — I10 Essential (primary) hypertension: Secondary | ICD-10-CM | POA: Diagnosis not present

## 2015-01-22 DIAGNOSIS — E785 Hyperlipidemia, unspecified: Secondary | ICD-10-CM | POA: Diagnosis not present

## 2015-01-22 DIAGNOSIS — E1140 Type 2 diabetes mellitus with diabetic neuropathy, unspecified: Secondary | ICD-10-CM

## 2015-01-22 DIAGNOSIS — IMO0002 Reserved for concepts with insufficient information to code with codable children: Secondary | ICD-10-CM

## 2015-01-22 DIAGNOSIS — E1165 Type 2 diabetes mellitus with hyperglycemia: Secondary | ICD-10-CM | POA: Diagnosis not present

## 2015-01-22 MED ORDER — GLUCOSE BLOOD VI STRP
ORAL_STRIP | Status: DC
Start: 2015-01-22 — End: 2016-01-31

## 2015-01-22 NOTE — Patient Instructions (Signed)
Recent blood sugar control is excellent.  Keep up the good work with diet!  Follow up in 3 months.

## 2015-01-22 NOTE — Assessment & Plan Note (Signed)
Recent blood sugar control excellent. Continue Glipizide-metformin. Follow up in 3 months.

## 2015-01-22 NOTE — Progress Notes (Signed)
Subjective:    Patient ID: Shane Jones, male    DOB: Sep 09, 1945, 70 y.o.   MRN: [id number]  HPI  70YO male presents for follow up.  Recently seen in 12/2014 to follow up DM. Lab Results  Component Value Date   HGBA1C 6.7* 01/06/2015   Recent blood sugar control has been excellent, except with recent illness. BG up to 150-200 when ill. Compliant with medications.  HTN - No CP, HA. Compliant with medication.  Recently sick with cough and congestion. This has resolved.  Wt Readings from Last 3 Encounters:  01/22/15 202 lb (91.627 kg)  12/10/14 204 lb 4 oz (92.647 kg)  10/16/14 201 lb 4 oz (91.286 kg)   BP Readings from Last 3 Encounters:  01/22/15 118/72  12/10/14 160/91  10/16/14 155/88    Past Medical History  Diagnosis Date  . Gout   . Hypertension    No family history on file. No past surgical history on file. Social History   Social History  . Marital Status: Married    Spouse Name: N/A  . Number of Children: N/A  . Years of Education: N/A   Social History Main Topics  . Smoking status: Former Smoker    Quit date: 11/20/1978  . Smokeless tobacco: Never Used  . Alcohol Use: No  . Drug Use: No  . Sexual Activity: Not Asked   Other Topics Concern  . None   Social History Narrative    Review of Systems  Constitutional: Negative for fever, chills, activity change, appetite change, fatigue and unexpected weight change.  HENT: Negative for congestion, postnasal drip, rhinorrhea, sinus pressure and sore throat.   Eyes: Negative for visual disturbance.  Respiratory: Negative for cough and shortness of breath.   Cardiovascular: Negative for chest pain, palpitations and leg swelling.  Gastrointestinal: Negative for nausea, vomiting, abdominal pain, diarrhea, constipation and abdominal distention.  Genitourinary: Negative for dysuria, urgency and difficulty urinating.  Musculoskeletal: Negative for arthralgias and gait problem.  Skin: Negative for color  change and rash.  Hematological: Negative for adenopathy.  Psychiatric/Behavioral: Negative for suicidal ideas, sleep disturbance and dysphoric mood. The patient is not nervous/anxious.        Objective:    BP 118/72 mmHg  Pulse 90  Temp(Src) 98.3 F (36.8 C) (Oral)  Resp 20  Wt 202 lb (91.627 kg)  SpO2 93% Physical Exam  Constitutional: He is oriented to person, place, and time. He appears well-developed and well-nourished. No distress.  HENT:  Head: Normocephalic and atraumatic.  Right Ear: External ear normal.  Left Ear: External ear normal.  Nose: Nose normal.  Mouth/Throat: Oropharynx is clear and moist. No oropharyngeal exudate.  Eyes: Conjunctivae and EOM are normal. Pupils are equal, round, and reactive to light. Right eye exhibits no discharge. Left eye exhibits no discharge. No scleral icterus.  Neck: Normal range of motion. Neck supple. No tracheal deviation present. No thyromegaly present.  Cardiovascular: Normal rate, regular rhythm and normal heart sounds.  Exam reveals no gallop and no friction rub.   No murmur heard. Pulmonary/Chest: Effort normal and breath sounds normal. No accessory muscle usage. No tachypnea. No respiratory distress. He has no decreased breath sounds. He has no wheezes. He has no rhonchi. He has no rales. He exhibits no tenderness.  Musculoskeletal: Normal range of motion. He exhibits no edema.  Lymphadenopathy:    He has no cervical adenopathy.  Neurological: He is alert and oriented to person, place, and time. No cranial nerve deficit.  Coordination normal.  Skin: Skin is warm and dry. No rash noted. He is not diaphoretic. No erythema. No pallor.  Psychiatric: He has a normal mood and affect. His behavior is normal. Judgment and thought content normal.          Assessment & Plan:   Problem List Items Addressed This Visit      Unprioritized   Hyperlipidemia     Recent LFTs normal. Continue Atorvastatin.      Hypertension    BP  Readings from Last 3 Encounters:  01/22/15 118/72  12/10/14 160/91  10/16/14 155/88   BP well controlled. Continue current medication.      Type 2 diabetes, uncontrolled, with neuropathy ([place]) - Primary    Recent blood sugar control excellent. Continue Glipizide-metformin. Follow up in 3 months.          Return in about 3 months (around 04/22/2015) for Recheck of Diabetes.

## 2015-01-22 NOTE — Assessment & Plan Note (Signed)
BP Readings from Last 3 Encounters:  01/22/15 118/72  12/10/14 160/91  10/16/14 155/88   BP well controlled. Continue current medication.

## 2015-01-22 NOTE — Assessment & Plan Note (Signed)
Recent LFTs normal. Continue Atorvastatin.

## 2015-01-22 NOTE — Progress Notes (Signed)
Pre visit review using our clinic review tool, if applicable. No additional management support is needed unless otherwise documented below in the visit note. 

## 2015-01-25 ENCOUNTER — Telehealth: Payer: Self-pay | Admitting: *Deleted

## 2015-01-25 NOTE — Telephone Encounter (Signed)
Please advise 

## 2015-01-25 NOTE — Telephone Encounter (Signed)
Patient Rx for his test strips (t[facility]etr[facility]) was sent to [facility] with out instructions. He will need a new Rx sent over with instructions.

## 2015-01-25 NOTE — Telephone Encounter (Signed)
Called pharmacy and clarified.  Prescription ready to be picked up.

## 2015-03-16 ENCOUNTER — Other Ambulatory Visit: Payer: Self-pay

## 2015-03-16 NOTE — Telephone Encounter (Signed)
LMOMTCB about diabetic supplies he need refills on, and how much he is testing his sugar

## 2015-03-16 NOTE — Telephone Encounter (Signed)
Form for diabetic supplies filled out and put in folder for Dr. Gilford Rile to sign

## 2015-04-26 ENCOUNTER — Other Ambulatory Visit: Payer: Self-pay | Admitting: Internal Medicine

## 2015-04-27 ENCOUNTER — Telehealth: Payer: Self-pay

## 2015-04-27 ENCOUNTER — Ambulatory Visit (INDEPENDENT_AMBULATORY_CARE_PROVIDER_SITE_OTHER): Payer: Medicare Other | Admitting: Internal Medicine

## 2015-04-27 ENCOUNTER — Encounter: Payer: Self-pay | Admitting: Internal Medicine

## 2015-04-27 VITALS — BP 128/88 | HR 64 | Ht 65.0 in | Wt 204.0 lb

## 2015-04-27 DIAGNOSIS — I10 Essential (primary) hypertension: Secondary | ICD-10-CM

## 2015-04-27 DIAGNOSIS — E1165 Type 2 diabetes mellitus with hyperglycemia: Secondary | ICD-10-CM

## 2015-04-27 DIAGNOSIS — E785 Hyperlipidemia, unspecified: Secondary | ICD-10-CM | POA: Diagnosis not present

## 2015-04-27 DIAGNOSIS — L989 Disorder of the skin and subcutaneous tissue, unspecified: Secondary | ICD-10-CM

## 2015-04-27 DIAGNOSIS — E1140 Type 2 diabetes mellitus with diabetic neuropathy, unspecified: Secondary | ICD-10-CM

## 2015-04-27 DIAGNOSIS — IMO0002 Reserved for concepts with insufficient information to code with codable children: Secondary | ICD-10-CM

## 2015-04-27 LAB — COMPREHENSIVE METABOLIC PANEL
ALT: 28 U/L (ref 0–53)
AST: 24 U/L (ref 0–37)
Albumin: 4.6 g/dL (ref 3.5–5.2)
Alkaline Phosphatase: 64 U/L (ref 39–117)
BILIRUBIN TOTAL: 0.5 mg/dL (ref 0.2–1.2)
BUN: 14 mg/dL (ref 6–23)
CHLORIDE: 103 meq/L (ref 96–112)
CO2: 32 meq/L (ref 19–32)
CREATININE: 1.26 mg/dL (ref 0.40–1.50)
Calcium: 10.4 mg/dL (ref 8.4–10.5)
GFR: 59.78 mL/min — ABNORMAL LOW (ref >60.00)
GLUCOSE: 130 mg/dL — AB (ref 70–99)
Potassium: 4.3 mEq/L (ref 3.5–5.1)
SODIUM: 144 meq/L (ref 135–145)
Total Protein: 7.5 g/dL (ref 6.0–8.3)

## 2015-04-27 LAB — HEMOGLOBIN A1C: Hgb A1c MFr Bld: 7 % — ABNORMAL HIGH (ref 4.6–6.5)

## 2015-04-27 MED ORDER — GLIPIZIDE-METFORMIN HCL 2.5-500 MG PO TABS: 1.0000 | ORAL_TABLET | Freq: Two times a day (BID) | ORAL | Status: DC

## 2015-04-27 MED ORDER — LOSARTAN POTASSIUM 100 MG PO TABS: ORAL_TABLET | ORAL | Status: DC

## 2015-04-27 MED ORDER — JANUVIA 25 MG PO TABS: ORAL_TABLET | ORAL | Status: DC

## 2015-04-27 MED ORDER — AMLODIPINE BESYLATE 10 MG PO TABS: ORAL_TABLET | ORAL | Status: DC

## 2015-04-27 MED ORDER — ATORVASTATIN CALCIUM 20 MG PO TABS: ORAL_TABLET | ORAL | Status: DC

## 2015-04-27 MED ORDER — METOPROLOL SUCCINATE ER 100 MG PO TB24: ORAL_TABLET | ORAL | Status: DC

## 2015-04-27 MED ORDER — GABAPENTIN 400 MG PO CAPS: 400.0000 mg | ORAL_CAPSULE | Freq: Three times a day (TID) | ORAL | Status: DC

## 2015-04-27 MED ORDER — ALLOPURINOL 100 MG PO TABS: ORAL_TABLET | ORAL | Status: DC

## 2015-04-27 NOTE — Assessment & Plan Note (Signed)
Check LFTs with labs today. Continue Atorvastatin

## 2015-04-27 NOTE — Patient Instructions (Signed)
Labs today.   Follow up in 3 months.  

## 2015-04-27 NOTE — Progress Notes (Signed)
Pre visit review using our clinic review tool, if applicable. No additional management support is needed unless otherwise documented below in the visit note. 

## 2015-04-27 NOTE — Assessment & Plan Note (Signed)
BG well controlled by report. A1c with labs.

## 2015-04-27 NOTE — Telephone Encounter (Signed)
Patient aware of results and recommendations. °

## 2015-04-27 NOTE — Progress Notes (Signed)
Subjective:    Patient ID: MAL GRISCOM, male    DOB: 06-15-1945, 70 y.o.   MRN: [id number]  HPI  70YO male presents for follow up.  DM - BG near 120s. Compliant with medication. Trying to follow healthy diet. Also taking daily vinegar to help with symptoms.  Lab Results  Component Value Date   HGBA1C 6.7* 01/06/2015   HTN - Compliant with medication. No CP, HA.  Wt Readings from Last 3 Encounters:  04/27/15 204 lb (92.534 kg)  01/22/15 202 lb (91.627 kg)  12/10/14 204 lb 4 oz (92.647 kg)   BP Readings from Last 3 Encounters:  04/27/15 128/88  01/22/15 118/72  12/10/14 160/91    Past Medical History  Diagnosis Date  . Gout   . Hypertension    No family history on file. No past surgical history on file. Social History   Social History  . Marital Status: Married    Spouse Name: N/A  . Number of Children: N/A  . Years of Education: N/A   Social History Main Topics  . Smoking status: Former Smoker    Quit date: 11/20/1978  . Smokeless tobacco: Never Used  . Alcohol Use: No  . Drug Use: No  . Sexual Activity: Not Asked   Other Topics Concern  . None   Social History Narrative    Review of Systems  Constitutional: Negative for fever, chills, activity change, appetite change, fatigue and unexpected weight change.  Eyes: Negative for visual disturbance.  Respiratory: Negative for cough and shortness of breath.   Cardiovascular: Negative for chest pain, palpitations and leg swelling.  Gastrointestinal: Negative for nausea, vomiting, abdominal pain, diarrhea, constipation and abdominal distention.  Genitourinary: Negative for dysuria, urgency and difficulty urinating.  Musculoskeletal: Negative for arthralgias and gait problem.  Skin: Negative for color change and rash.  Neurological: Negative for weakness and headaches.  Hematological: Negative for adenopathy.  Psychiatric/Behavioral: Negative for sleep disturbance and dysphoric mood. The patient is not  nervous/anxious.        Objective:    BP 128/88 mmHg  Pulse 64  Ht 5\' 5"  (1.651 m)  Wt 204 lb (92.534 kg)  BMI 33.95 kg/m2  SpO2 97% Physical Exam  Constitutional: He is oriented to person, place, and time. He appears well-developed and well-nourished. No distress.  HENT:  Head: Normocephalic and atraumatic.  Right Ear: External ear normal.  Left Ear: External ear normal.  Nose: Nose normal.  Mouth/Throat: Oropharynx is clear and moist. No oropharyngeal exudate.  Eyes: Conjunctivae and EOM are normal. Pupils are equal, round, and reactive to light. Right eye exhibits no discharge. Left eye exhibits no discharge. No scleral icterus.  Neck: Normal range of motion. Neck supple. No tracheal deviation present. No thyromegaly present.  Cardiovascular: Normal rate, regular rhythm and normal heart sounds.  Exam reveals no gallop and no friction rub.   No murmur heard. Pulmonary/Chest: Effort normal and breath sounds normal. No accessory muscle usage. No tachypnea. No respiratory distress. He has no decreased breath sounds. He has no wheezes. He has no rhonchi. He has no rales. He exhibits no tenderness.  Musculoskeletal: Normal range of motion. He exhibits no edema.  Lymphadenopathy:    He has no cervical adenopathy.  Neurological: He is alert and oriented to person, place, and time. No cranial nerve deficit. Coordination normal.  Skin: Skin is warm and dry. No rash noted. He is not diaphoretic. No erythema. No pallor.  Psychiatric: He has a normal mood and  affect. His behavior is normal. Judgment and thought content normal.          Assessment & Plan:   Problem List Items Addressed This Visit      Unprioritized   Hyperlipidemia    Check LFTs with labs today. Continue Atorvastatin      Relevant Medications   amLODipine (NORVASC) 10 MG tablet   atorvastatin (LIPITOR) 20 MG tablet   losartan (COZAAR) 100 MG tablet   metoprolol succinate (TOPROL-XL) 100 MG 24 hr tablet    Hypertension    BP Readings from Last 3 Encounters:  04/27/15 128/88  01/22/15 118/72  12/10/14 160/91   BP well controlled. Renal function with labs.      Relevant Medications   amLODipine (NORVASC) 10 MG tablet   atorvastatin (LIPITOR) 20 MG tablet   losartan (COZAAR) 100 MG tablet   metoprolol succinate (TOPROL-XL) 100 MG 24 hr tablet   Type 2 diabetes, uncontrolled, with neuropathy ([place]) - Primary    BG well controlled by report. A1c with labs.      Relevant Medications   atorvastatin (LIPITOR) 20 MG tablet   glipiZIDE-metformin (METAGLIP) 2.5-500 MG tablet   JANUVIA 25 MG tablet   losartan (COZAAR) 100 MG tablet   Other Relevant Orders   Comprehensive metabolic panel   Hemoglobin A1c       Return in about 3 months (around 07/27/2015) for Recheck of Diabetes.  Ronette Deter, MD Internal Medicine [facility]

## 2015-04-27 NOTE — Assessment & Plan Note (Signed)
BP Readings from Last 3 Encounters:  04/27/15 128/88  01/22/15 118/72  12/10/14 160/91   BP well controlled. Renal function with labs.

## 2015-04-27 NOTE — Addendum Note (Signed)
Addended by: Ronette Deter A on: 04/27/2015 09:00 AM   Modules accepted: Orders

## 2015-04-27 NOTE — Telephone Encounter (Signed)
-----   Message from Jackolyn Confer, MD sent at 04/27/2015 11:44 AM EDT ----- Labs show relatively stable control of blood sugars, and stable kidney and liver function.

## 2015-06-04 ENCOUNTER — Other Ambulatory Visit: Payer: Self-pay | Admitting: Internal Medicine

## 2015-06-07 NOTE — Telephone Encounter (Signed)
Refill request for Lasix and Reflafen, last seen [id number], last filled [id number].  Please advise.

## 2015-07-27 ENCOUNTER — Ambulatory Visit (INDEPENDENT_AMBULATORY_CARE_PROVIDER_SITE_OTHER): Payer: Medicare Other | Admitting: Internal Medicine

## 2015-07-27 ENCOUNTER — Encounter: Payer: Self-pay | Admitting: Internal Medicine

## 2015-07-27 VITALS — BP 142/88 | HR 67 | Ht 65.0 in | Wt 206.4 lb

## 2015-07-27 DIAGNOSIS — I10 Essential (primary) hypertension: Secondary | ICD-10-CM

## 2015-07-27 DIAGNOSIS — E1165 Type 2 diabetes mellitus with hyperglycemia: Secondary | ICD-10-CM

## 2015-07-27 DIAGNOSIS — IMO0002 Reserved for concepts with insufficient information to code with codable children: Secondary | ICD-10-CM

## 2015-07-27 DIAGNOSIS — E1140 Type 2 diabetes mellitus with diabetic neuropathy, unspecified: Secondary | ICD-10-CM

## 2015-07-27 DIAGNOSIS — E785 Hyperlipidemia, unspecified: Secondary | ICD-10-CM | POA: Diagnosis not present

## 2015-07-27 DIAGNOSIS — G629 Polyneuropathy, unspecified: Secondary | ICD-10-CM

## 2015-07-27 LAB — HM DIABETES EYE EXAM

## 2015-07-27 LAB — COMPREHENSIVE METABOLIC PANEL
ALK PHOS: 58 U/L (ref 39–117)
ALT: 32 U/L (ref 0–53)
AST: 27 U/L (ref 0–37)
Albumin: 4.5 g/dL (ref 3.5–5.2)
BILIRUBIN TOTAL: 0.8 mg/dL (ref 0.2–1.2)
BUN: 18 mg/dL (ref 6–23)
CO2: 32 mEq/L (ref 19–32)
CREATININE: 1.34 mg/dL (ref 0.40–1.50)
Calcium: 10.1 mg/dL (ref 8.4–10.5)
Chloride: 102 mEq/L (ref 96–112)
GFR: 55.64 mL/min — AB (ref >60.00)
GLUCOSE: 162 mg/dL — AB (ref 70–99)
Potassium: 4.2 mEq/L (ref 3.5–5.1)
Sodium: 143 mEq/L (ref 135–145)
TOTAL PROTEIN: 7.5 g/dL (ref 6.0–8.3)

## 2015-07-27 LAB — HEMOGLOBIN A1C: Hgb A1c MFr Bld: 7.1 % — ABNORMAL HIGH (ref 4.6–6.5)

## 2015-07-27 LAB — LDL CHOLESTEROL, DIRECT: LDL DIRECT: 43 mg/dL

## 2015-07-27 LAB — MICROALBUMIN / CREATININE URINE RATIO
CREATININE, U: 53.1 mg/dL
MICROALB UR: 25.5 mg/dL — AB (ref 0.0–1.9)
Microalb Creat Ratio: 48 mg/g — ABNORMAL HIGH (ref 0.0–30.0)

## 2015-07-27 LAB — HM DIABETES FOOT EXAM: HM DIABETIC FOOT EXAM: NORMAL

## 2015-07-27 LAB — LIPID PANEL
CHOLESTEROL: 120 mg/dL (ref 0–200)
HDL: 30 mg/dL — ABNORMAL LOW (ref >39.00)
Total CHOL/HDL Ratio: 4

## 2015-07-27 NOTE — Patient Instructions (Signed)
Labs today.  Follow up in 3 months and sooner as needed. 

## 2015-07-27 NOTE — Assessment & Plan Note (Signed)
BG well controlled by report. A1c with labs. Continue current medication. Follow up in 3 months.

## 2015-07-27 NOTE — Assessment & Plan Note (Signed)
Symptoms well controlled on current medication. Will continue.

## 2015-07-27 NOTE — Assessment & Plan Note (Signed)
Will check lipids with labs continue Atorvastatin.

## 2015-07-27 NOTE — Progress Notes (Signed)
Pre visit review using our clinic review tool, if applicable. No additional management support is needed unless otherwise documented below in the visit note. 

## 2015-07-27 NOTE — Progress Notes (Signed)
Subjective:    Patient ID: Shane Jones, male    DOB: 30-Nov-1945, 70 y.o.   MRN: [id number]  HPI  70YO male presents for follow up.  DM - BG near 120 in the mornings. Compliant with medication.   Lab Results  Component Value Date   HGBA1C 7.0 (H) 04/27/2015   HTN - Compliant with medication. No CP, HA.  Wt Readings from Last 3 Encounters:  07/27/15 206 lb 6.4 oz (93.6 kg)  04/27/15 204 lb (92.5 kg)  01/22/15 202 lb (91.6 kg)   BP Readings from Last 3 Encounters:  07/27/15 (!) 142/88  04/27/15 128/88  01/22/15 118/72    Past Medical History:  Diagnosis Date  . Gout   . Hypertension    No family history on file. No past surgical history on file. Social History   Social History  . Marital status: Married    Spouse name: N/A  . Number of children: N/A  . Years of education: N/A   Social History Main Topics  . Smoking status: Former Smoker    Quit date: 11/20/1978  . Smokeless tobacco: Never Used  . Alcohol use No  . Drug use: No  . Sexual activity: Not Asked   Other Topics Concern  . None   Social History Narrative  . None    Review of Systems  Constitutional: Negative for activity change, appetite change, chills, fatigue, fever and unexpected weight change.  Eyes: Negative for visual disturbance.  Respiratory: Negative for cough and shortness of breath.   Cardiovascular: Negative for chest pain, palpitations and leg swelling.  Gastrointestinal: Negative for abdominal distention, abdominal pain, constipation, diarrhea, nausea and vomiting.  Genitourinary: Negative for difficulty urinating, dysuria and urgency.  Musculoskeletal: Negative for arthralgias and gait problem.  Skin: Negative for color change and rash.  Hematological: Negative for adenopathy.  Psychiatric/Behavioral: Negative for dysphoric mood and sleep disturbance. The patient is not nervous/anxious.        Objective:    BP (!) 142/88 (BP Location: Left Arm, Patient Position:  Sitting, Cuff Size: Large)   Pulse 67   Ht 5\' 5"  (1.651 m)   Wt 206 lb 6.4 oz (93.6 kg)   SpO2 94%   BMI 34.35 kg/m  Physical Exam  Constitutional: He is oriented to person, place, and time. He appears well-developed and well-nourished. No distress.  HENT:  Head: Normocephalic and atraumatic.  Right Ear: External ear normal.  Left Ear: External ear normal.  Nose: Nose normal.  Mouth/Throat: Oropharynx is clear and moist. No oropharyngeal exudate.  Eyes: Conjunctivae and EOM are normal. Pupils are equal, round, and reactive to light. Right eye exhibits no discharge. Left eye exhibits no discharge. No scleral icterus.  Neck: Normal range of motion. Neck supple. No tracheal deviation present. No thyromegaly present.  Cardiovascular: Normal rate, regular rhythm and normal heart sounds.  Exam reveals no gallop and no friction rub.   No murmur heard. Pulmonary/Chest: Effort normal and breath sounds normal. No accessory muscle usage. No tachypnea. No respiratory distress. He has no decreased breath sounds. He has no wheezes. He has no rhonchi. He has no rales. He exhibits no tenderness.  Musculoskeletal: Normal range of motion. He exhibits no edema.  Lymphadenopathy:    He has no cervical adenopathy.  Neurological: He is alert and oriented to person, place, and time. No cranial nerve deficit. Coordination normal.  Skin: Skin is warm and dry. No rash noted. He is not diaphoretic. No erythema. No pallor.  Psychiatric: He has a normal mood and affect. His behavior is normal. Judgment and thought content normal.          Assessment & Plan:   Problem List Items Addressed This Visit      High   Hypertension (Chronic)    BP Readings from Last 3 Encounters:  07/27/15 (!) 142/88  04/27/15 128/88  01/22/15 118/72   BP well controlled generally. Slightly elevated today.Will check renal function with labs. Continue current medications.       Type 2 diabetes, uncontrolled, with neuropathy  ([facility]) - Primary (Chronic)    BG well controlled by report. A1c with labs. Continue current medication. Follow up in 3 months.      Relevant Orders   Comprehensive metabolic panel   Hemoglobin A1c   Lipid panel   Microalbumin / creatinine urine ratio     Unprioritized   Hyperlipidemia (Chronic)    Will check lipids with labs continue Atorvastatin.      Neuropathy (HCC) (Chronic)    Symptoms well controlled on current medication. Will continue.       Other Visit Diagnoses   None.      Return in about 3 months (around 10/27/2015) for New Patient.  Ronette Deter, MD Internal Medicine [facility]

## 2015-07-27 NOTE — Assessment & Plan Note (Signed)
BP Readings from Last 3 Encounters:  07/27/15 (!) 142/88  04/27/15 128/88  01/22/15 118/72   BP well controlled generally. Slightly elevated today.Will check renal function with labs. Continue current medications.

## 2015-07-28 ENCOUNTER — Other Ambulatory Visit: Payer: Self-pay

## 2015-07-28 MED ORDER — ATORVASTATIN CALCIUM 40 MG PO TABS: ORAL_TABLET | ORAL | 3 refills | Status: DC

## 2015-07-28 NOTE — Telephone Encounter (Signed)
Change medication dose due to lab results/Dr. Gilford Rile.

## 2015-08-02 DIAGNOSIS — L812 Freckles: Secondary | ICD-10-CM | POA: Diagnosis not present

## 2015-08-02 DIAGNOSIS — D224 Melanocytic nevi of scalp and neck: Secondary | ICD-10-CM | POA: Diagnosis not present

## 2015-08-02 DIAGNOSIS — D485 Neoplasm of uncertain behavior of skin: Secondary | ICD-10-CM | POA: Diagnosis not present

## 2015-08-02 DIAGNOSIS — L820 Inflamed seborrheic keratosis: Secondary | ICD-10-CM | POA: Diagnosis not present

## 2015-08-02 DIAGNOSIS — L821 Other seborrheic keratosis: Secondary | ICD-10-CM | POA: Diagnosis not present

## 2015-08-02 DIAGNOSIS — L578 Other skin changes due to chronic exposure to nonionizing radiation: Secondary | ICD-10-CM | POA: Diagnosis not present

## 2015-09-27 ENCOUNTER — Ambulatory Visit: Payer: Medicare Other | Admitting: Family Medicine

## 2015-10-11 DIAGNOSIS — L578 Other skin changes due to chronic exposure to nonionizing radiation: Secondary | ICD-10-CM | POA: Diagnosis not present

## 2015-10-11 DIAGNOSIS — L820 Inflamed seborrheic keratosis: Secondary | ICD-10-CM | POA: Diagnosis not present

## 2015-10-11 DIAGNOSIS — L821 Other seborrheic keratosis: Secondary | ICD-10-CM | POA: Diagnosis not present

## 2015-11-01 ENCOUNTER — Encounter: Payer: Self-pay | Admitting: Family Medicine

## 2015-11-01 ENCOUNTER — Ambulatory Visit (INDEPENDENT_AMBULATORY_CARE_PROVIDER_SITE_OTHER): Payer: Medicare Other | Admitting: Family Medicine

## 2015-11-01 VITALS — BP 136/84 | HR 74 | Temp 98.4°F | Wt 198.0 lb

## 2015-11-01 DIAGNOSIS — E785 Hyperlipidemia, unspecified: Secondary | ICD-10-CM

## 2015-11-01 DIAGNOSIS — E1142 Type 2 diabetes mellitus with diabetic polyneuropathy: Secondary | ICD-10-CM

## 2015-11-01 DIAGNOSIS — Z23 Encounter for immunization: Secondary | ICD-10-CM | POA: Diagnosis not present

## 2015-11-01 DIAGNOSIS — E1140 Type 2 diabetes mellitus with diabetic neuropathy, unspecified: Secondary | ICD-10-CM | POA: Diagnosis not present

## 2015-11-01 DIAGNOSIS — IMO0002 Reserved for concepts with insufficient information to code with codable children: Secondary | ICD-10-CM

## 2015-11-01 DIAGNOSIS — E1165 Type 2 diabetes mellitus with hyperglycemia: Secondary | ICD-10-CM

## 2015-11-01 DIAGNOSIS — L989 Disorder of the skin and subcutaneous tissue, unspecified: Secondary | ICD-10-CM | POA: Insufficient documentation

## 2015-11-01 DIAGNOSIS — I10 Essential (primary) hypertension: Secondary | ICD-10-CM

## 2015-11-01 LAB — LDL CHOLESTEROL, DIRECT: LDL DIRECT: 48 mg/dL

## 2015-11-01 LAB — LIPID PANEL
CHOL/HDL RATIO: 4
Cholesterol: 116 mg/dL (ref 0–200)
HDL: 30.4 mg/dL — AB (ref >39.00)
NONHDL: 85.45
Triglycerides: 321 mg/dL — ABNORMAL HIGH (ref 0.0–149.0)
VLDL: 64.2 mg/dL — AB (ref 0.0–40.0)

## 2015-11-01 LAB — COMPREHENSIVE METABOLIC PANEL
ALBUMIN: 4.4 g/dL (ref 3.5–5.2)
ALT: 27 U/L (ref 0–53)
AST: 25 U/L (ref 0–37)
Alkaline Phosphatase: 65 U/L (ref 39–117)
BUN: 14 mg/dL (ref 6–23)
CHLORIDE: 104 meq/L (ref 96–112)
CO2: 32 meq/L (ref 19–32)
CREATININE: 1.21 mg/dL (ref 0.40–1.50)
Calcium: 9.5 mg/dL (ref 8.4–10.5)
GFR: 62.55 mL/min (ref >60.00)
GLUCOSE: 194 mg/dL — AB (ref 70–99)
POTASSIUM: 4.1 meq/L (ref 3.5–5.1)
SODIUM: 145 meq/L (ref 135–145)
Total Bilirubin: 0.6 mg/dL (ref 0.2–1.2)
Total Protein: 7 g/dL (ref 6.0–8.3)

## 2015-11-01 LAB — HEMOGLOBIN A1C: HEMOGLOBIN A1C: 7.3 % — AB (ref 4.6–6.5)

## 2015-11-01 NOTE — Progress Notes (Signed)
Pre visit review using our clinic review tool, if applicable. No additional management support is needed unless otherwise documented below in the visit note. 

## 2015-11-01 NOTE — Assessment & Plan Note (Signed)
Patient with several described knots in his left foot. Benign exam today. Patient notes they have resolved. I advised him to monitor these for recurrence and to follow-up once they recur.

## 2015-11-01 NOTE — Assessment & Plan Note (Signed)
Tolerating medication. Check lipid panel and CMP today.

## 2015-11-01 NOTE — Assessment & Plan Note (Signed)
Generally well controlled. He'll continue his current medications.

## 2015-11-01 NOTE — Assessment & Plan Note (Signed)
Seems to be well-controlled. Check an A1c today. Continue current medications.

## 2015-11-01 NOTE — Patient Instructions (Signed)
Nice to see you. Please continue your blood pressure medications, diabetes medication, and cholesterol medication. If you develop knots on your feet again please let us know. We will check lab work and call you with the results.

## 2015-11-01 NOTE — Progress Notes (Signed)
  [name], MD Phone: [phone]  Shane Jones is a 70 y.o. male who presents today for f/u.  HYPERTENSION Disease Monitoring: Blood pressure [id number] systolic Chest pain- no      Dyspnea- no Medications: Compliance- taking losartan, amlodipine, metoprolol, lasix   Edema- no  DIABETES Disease Monitoring: Blood Sugar ranges-125 or less Polyuria/phagia/dipsia- no      Visual problems- no Medications: Compliance- taking januvia, metformin, glipizide Hypoglycemic symptoms- no  HYPERLIPIDEMIA Disease Monitoring: See symptoms for Hypertension Medications: Compliance- taking lipitor Right upper quadrant pain- no  Muscle aches- no  Reports at some point recently he had some knots on his left foot. These went away. No pain. No other symptoms with them. Has not ever had them before. Have not recurred.  PMH: former smoker   ROS see history of present illness  Objective  Physical Exam Vitals:   11/01/15 0759  BP: 136/84  Pulse: 74  Temp: 98.4 F (36.9 C)    BP Readings from Last 3 Encounters:  11/01/15 136/84  07/27/15 (!) 142/88  04/27/15 128/88   Wt Readings from Last 3 Encounters:  11/01/15 198 lb (89.8 kg)  07/27/15 206 lb 6.4 oz (93.6 kg)  04/27/15 204 lb (92.5 kg)    Physical Exam  Constitutional: He is well-developed, well-nourished, and in no distress.  Cardiovascular: Normal rate, regular rhythm and normal heart sounds.   Pulmonary/Chest: Effort normal and breath sounds normal.  Musculoskeletal: He exhibits no edema.  Neurological: He is alert. Gait normal.  Skin: Skin is warm and dry.  Left foot with no apparent nodules or skin changes over the MTP joints which is where he describes the knots occurring     Assessment/Plan: Please see individual problem list.  Hypertension Generally well controlled. He'll continue his current medications.  Type 2 diabetes, uncontrolled, with neuropathy ([place]) Seems to be well-controlled. Check an A1c  today. Continue current medications.  Hyperlipidemia Tolerating medication. Check lipid panel and CMP today.  Foot knot Patient with several described knots in his left foot. Benign exam today. Patient notes they have resolved. I advised him to monitor these for recurrence and to follow-up once they recur.   Orders Placed This Encounter  Procedures  . Flu vaccine HIGH DOSE PF  . HgB A1c  . Lipid Profile  . Comp Met (CMET)   [name], MD [facility]

## 2015-11-02 ENCOUNTER — Telehealth: Payer: Self-pay | Admitting: Family Medicine

## 2015-11-02 NOTE — Telephone Encounter (Signed)
Pt called back returning call regarding results. Thank you!  Call pt @ [phone]

## 2015-11-02 NOTE — Telephone Encounter (Signed)
Pt stated he will be at  [phone] this afternoon.

## 2015-11-02 NOTE — Telephone Encounter (Signed)
LM to return call.

## 2015-11-03 NOTE — Telephone Encounter (Signed)
Notified patient of results 

## 2015-11-30 ENCOUNTER — Telehealth: Payer: Self-pay | Admitting: Family Medicine

## 2015-11-30 NOTE — Telephone Encounter (Signed)
Pt declined to get a AWV. Thank you! °

## 2015-12-01 ENCOUNTER — Encounter: Payer: Self-pay | Admitting: Surgical

## 2015-12-02 ENCOUNTER — Telehealth: Payer: Self-pay | Admitting: Family Medicine

## 2015-12-02 MED ORDER — METFORMIN HCL 1000 MG PO TABS: 1000.0000 mg | ORAL_TABLET | Freq: Two times a day (BID) | ORAL | 3 refills | Status: DC

## 2015-12-02 MED ORDER — GLIPIZIDE 5 MG PO TABS: 2.5000 mg | ORAL_TABLET | Freq: Two times a day (BID) | ORAL | 3 refills | Status: DC

## 2015-12-02 NOTE — Telephone Encounter (Signed)
Please confirm his metformin dose. It appears he only takes metformin 500 mg twice daily. I would like to just increase his metformin and we could discontinue the [place] all together  And see how he does off the [place].

## 2015-12-02 NOTE — Telephone Encounter (Signed)
Called patient. Informed him that we're going to be changing his regimen around. We will discontinue the combination glipizide metformin tablet. We will start him on metformin 1000 mg twice daily. He'll continue glipizide 2.5 mg twice daily. This is sent to his pharmacy.

## 2015-12-02 NOTE — Telephone Encounter (Signed)
Pt called and needs a medication refilled for his JANUVIA 25 MG tablet. Pt has no more medication left. Can we please call his local pharmacy, and the rest needs the rest to be sent to the mail away. Can you please call pt when complete.Please advise, thank you!  Pharmacy - [facility] [place], [place] - [address] [place] [facility] - [place], [place]  Call pt @ [phone]

## 2015-12-02 NOTE — Telephone Encounter (Signed)
Spoke with patient and he is taking the Metformin plus the Januvia 25 mg. He is completely out of the Januvia, but can't afford due to being in the donut whole. Can we give him samples to last to after the first of the year.

## 2015-12-02 NOTE — Telephone Encounter (Signed)
Spoke with patient and he is taking the Glipizide-metformin combination tablet. Per dr. Caryl Bis he will increase the metformin to 1000mg . I advised the patient that he can stop taking the januvia per Dr. Caryl Bis. Please send 30 day supply to local pharmacy and longer supply to mail order.

## 2016-01-27 DIAGNOSIS — E119 Type 2 diabetes mellitus without complications: Secondary | ICD-10-CM | POA: Diagnosis not present

## 2016-01-27 LAB — HM DIABETES EYE EXAM

## 2016-01-31 ENCOUNTER — Encounter: Payer: Self-pay | Admitting: Family Medicine

## 2016-01-31 ENCOUNTER — Ambulatory Visit (INDEPENDENT_AMBULATORY_CARE_PROVIDER_SITE_OTHER): Payer: Medicare Other | Admitting: Family Medicine

## 2016-01-31 ENCOUNTER — Telehealth: Payer: Self-pay | Admitting: Family Medicine

## 2016-01-31 VITALS — BP 120/80 | HR 67 | Temp 98.8°F | Wt 194.4 lb

## 2016-01-31 DIAGNOSIS — E785 Hyperlipidemia, unspecified: Secondary | ICD-10-CM | POA: Diagnosis not present

## 2016-01-31 DIAGNOSIS — M25512 Pain in left shoulder: Secondary | ICD-10-CM | POA: Insufficient documentation

## 2016-01-31 DIAGNOSIS — E1165 Type 2 diabetes mellitus with hyperglycemia: Principal | ICD-10-CM

## 2016-01-31 DIAGNOSIS — E1140 Type 2 diabetes mellitus with diabetic neuropathy, unspecified: Secondary | ICD-10-CM

## 2016-01-31 DIAGNOSIS — E119 Type 2 diabetes mellitus without complications: Secondary | ICD-10-CM

## 2016-01-31 DIAGNOSIS — IMO0002 Reserved for concepts with insufficient information to code with codable children: Secondary | ICD-10-CM

## 2016-01-31 DIAGNOSIS — I10 Essential (primary) hypertension: Secondary | ICD-10-CM

## 2016-01-31 LAB — HEMOGLOBIN A1C: Hgb A1c MFr Bld: 7.9 % — ABNORMAL HIGH (ref 4.6–6.5)

## 2016-01-31 MED ORDER — GLUCOSE BLOOD VI STRP: ORAL_STRIP | 12 refills | Status: DC

## 2016-01-31 NOTE — Patient Instructions (Signed)
Nice to see you. Please continue current medications. Please to do the following exercises for your left shoulder.   Shoulder Exercises Ask your health care provider which exercises are safe for you. Do exercises exactly as told by your health care provider and adjust them as directed. It is normal to feel mild stretching, pulling, tightness, or discomfort as you do these exercises, but you should stop right away if you feel sudden pain or your pain gets worse.Do not begin these exercises until told by your health care provider. RANGE OF MOTION EXERCISES  These exercises warm up your muscles and joints and improve the movement and flexibility of your shoulder. These exercises also help to relieve pain, numbness, and tingling. These exercises involve stretching your injured shoulder directly. Exercise A: Pendulum  1. Stand near a wall or a surface that you can hold onto for balance. 2. Bend at the waist and let your left / right arm hang straight down. Use your other arm to support you. Keep your back straight and do not lock your knees. 3. Relax your left / right arm and shoulder muscles, and move your hips and your trunk so your left / right arm swings freely. Your arm should swing because of the motion of your body, not because you are using your arm or shoulder muscles. 4. Keep moving your body so your arm swings in the following directions, as told by your health care provider:  Side to side.  Forward and backward.  In clockwise and counterclockwise circles. 5. Continue each motion for __________ seconds, or for as long as told by your health care provider. 6. Slowly return to the starting position. Repeat __________ times. Complete this exercise __________ times a day. Exercise B:Flexion, Standing  1. Stand and hold a broomstick, a cane, or a similar object. Place your hands a little more than shoulder-width apart on the object. Your left / right hand should be palm-up, and your other  hand should be palm-down. 2. Keep your elbow straight and keep your shoulder muscles relaxed. Push the stick down with your healthy arm to raise your left / right arm in front of your body, and then over your head until you feel a stretch in your shoulder.  Avoid shrugging your shoulder while you raise your arm. Keep your shoulder blade tucked down toward the middle of your back. 3. Hold for __________ seconds. 4. Slowly return to the starting position. Repeat __________ times. Complete this exercise __________ times a day. Exercise C: Abduction, Standing 1. Stand and hold a broomstick, a cane, or a similar object. Place your hands a little more than shoulder-width apart on the object. Your left / right hand should be palm-up, and your other hand should be palm-down. 2. While keeping your elbow straight and your shoulder muscles relaxed, push the stick across your body toward your left / right side. Raise your left / right arm to the side of your body and then over your head until you feel a stretch in your shoulder.  Do not raise your arm above shoulder height, unless your health care provider tells you to do that.  Avoid shrugging your shoulder while you raise your arm. Keep your shoulder blade tucked down toward the middle of your back. 3. Hold for __________ seconds. 4. Slowly return to the starting position. Repeat __________ times. Complete this exercise __________ times a day. Exercise D:Internal Rotation  1. Place your left / right hand behind your back, palm-up. 2. Use  your other hand to dangle an exercise band, a towel, or a similar object over your shoulder. Grasp the band with your left / right hand so you are holding onto both ends. 3. Gently pull up on the band until you feel a stretch in the front of your left / right shoulder.  Avoid shrugging your shoulder while you raise your arm. Keep your shoulder blade tucked down toward the middle of your back. 4. Hold for __________  seconds. 5. Release the stretch by letting go of the band and lowering your hands. Repeat __________ times. Complete this exercise __________ times a day. STRETCHING EXERCISES  These exercises warm up your muscles and joints and improve the movement and flexibility of your shoulder. These exercises also help to relieve pain, numbness, and tingling. These exercises are done using your healthy shoulder to help stretch the muscles of your injured shoulder. Exercise E: Warehouse manager (External Rotation and Abduction)  1. Stand in a doorway with one of your feet slightly in front of the other. This is called a staggered stance. If you cannot reach your forearms to the door frame, stand facing a corner of a room. 2. Choose one of the following positions as told by your health care provider:  Place your hands and forearms on the door frame above your head.  Place your hands and forearms on the door frame at the height of your head.  Place your hands on the door frame at the height of your elbows. 3. Slowly move your weight onto your front foot until you feel a stretch across your chest and in the front of your shoulders. Keep your head and chest upright and keep your abdominal muscles tight. 4. Hold for __________ seconds. 5. To release the stretch, shift your weight to your back foot. Repeat __________ times. Complete this stretch __________ times a day. Exercise F:Extension, Standing 1. Stand and hold a broomstick, a cane, or a similar object behind your back.  Your hands should be a little wider than shoulder-width apart.  Your palms should face away from your back. 2. Keeping your elbows straight and keeping your shoulder muscles relaxed, move the stick away from your body until you feel a stretch in your shoulder.  Avoid shrugging your shoulders while you move the stick. Keep your shoulder blade tucked down toward the middle of your back. 3. Hold for __________ seconds. 4. Slowly return to  the starting position. Repeat __________ times. Complete this exercise __________ times a day. STRENGTHENING EXERCISES  These exercises build strength and endurance in your shoulder. Endurance is the ability to use your muscles for a long time, even after they get tired. Exercise G:External Rotation  1. Sit in a stable chair without armrests. 2. Secure an exercise band at elbow height on your left / right side. 3. Place a soft object, such as a folded towel or a small pillow, between your left / right upper arm and your body to move your elbow a few inches away (about 10 cm) from your side. 4. Hold the end of the band so it is tight and there is no slack. 5. Keeping your elbow pressed against the soft object, move your left / right forearm out, away from your abdomen. Keep your body steady so only your forearm moves. 6. Hold for __________ seconds. 7. Slowly return to the starting position. Repeat __________ times. Complete this exercise __________ times a day. Exercise H:Shoulder Abduction  1. Sit in a stable chair  without armrests, or stand. 2. Hold a __________ weight in your left / right hand, or hold an exercise band with both hands. 3. Start with your arms straight down and your left / right palm facing in, toward your body. 4. Slowly lift your left / right hand out to your side. Do not lift your hand above shoulder height unless your health care provider tells you that this is safe.  Keep your arms straight.  Avoid shrugging your shoulder while you do this movement. Keep your shoulder blade tucked down toward the middle of your back. 5. Hold for __________ seconds. 6. Slowly lower your arm, and return to the starting position. Repeat __________ times. Complete this exercise __________ times a day. Exercise I:Shoulder Extension 1. Sit in a stable chair without armrests, or stand. 2. Secure an exercise band to a stable object in front of you where it is at shoulder height. 3. Hold  one end of the exercise band in each hand. Your palms should face each other. 4. Straighten your elbows and lift your hands up to shoulder height. 5. Step back, away from the secured end of the exercise band, until the band is tight and there is no slack. 6. Squeeze your shoulder blades together as you pull your hands down to the sides of your thighs. Stop when your hands are straight down by your sides. Do not let your hands go behind your body. 7. Hold for __________ seconds. 8. Slowly return to the starting position. Repeat __________ times. Complete this exercise __________ times a day. Exercise J:Standing Shoulder Row 1. Sit in a stable chair without armrests, or stand. 2. Secure an exercise band to a stable object in front of you so it is at waist height. 3. Hold one end of the exercise band in each hand. Your palms should be in a thumbs-up position. 4. Bend each of your elbows to an "L" shape (about 90 degrees) and keep your upper arms at your sides. 5. Step back until the band is tight and there is no slack. 6. Slowly pull your elbows back behind you. 7. Hold for __________ seconds. 8. Slowly return to the starting position. Repeat __________ times. Complete this exercise __________ times a day. Exercise K:Shoulder Press-Ups  1. Sit in a stable chair that has armrests. Sit upright, with your feet flat on the floor. 2. Put your hands on the armrests so your elbows are bent and your fingers are pointing forward. Your hands should be about even with the sides of your body. 3. Push down on the armrests and use your arms to lift yourself off of the chair. Straighten your elbows and lift yourself up as much as you comfortably can.  Move your shoulder blades down, and avoid letting your shoulders move up toward your ears.  Keep your feet on the ground. As you get stronger, your feet should support less of your body weight as you lift yourself up. 4. Hold for __________ seconds. 5. Slowly  lower yourself back into the chair. Repeat __________ times. Complete this exercise __________ times a day. Exercise L: Wall Push-Ups  1. Stand so you are facing a stable wall. Your feet should be about one arm-length away from the wall. 2. Lean forward and place your palms on the wall at shoulder height. 3. Keep your feet flat on the floor as you bend your elbows and lean forward toward the wall. 4. Hold for __________ seconds. 5. Straighten your elbows to push yourself  back to the starting position. Repeat __________ times. Complete this exercise __________ times a day. This information is not intended to replace advice given to you by your health care provider. Make sure you discuss any questions you have with your health care provider. Document Released: 11/02/2004 Document Revised: 09/13/2015 Document Reviewed: 08/30/2014 Elsevier Interactive Patient Education  2017 [employer].

## 2016-01-31 NOTE — Assessment & Plan Note (Signed)
Suspect muscular strain. Less likely related to the flu shot. Discussed exercises with patient. He will continue to monitor and if not improving could consider physical therapy.

## 2016-01-31 NOTE — Telephone Encounter (Signed)
glucose blood test strip

## 2016-01-31 NOTE — Progress Notes (Signed)
  [name], MD Phone: [phone]  Shane Jones is a 71 y.o. male who presents today for f/u.  HYPERTENSION Disease Monitoring: Blood pressure range-117-129/65-88  Chest pain- no      Dyspnea- no Medications: Compliance- taking metoprolol, losartan, lasix, amlodipine   Edema- no  DIABETES Disease Monitoring: Blood Sugar ranges-120-150 typically, did have a stomach virus several weeks ago and his sugar was up to 209 with that, though his symptoms have resolved and sugars have improved Polyuria/phagia/dipsia- notes he likes to drink water, though not because he is thirsty optho- up to date Medications: Compliance- taking metformin, glipizide Hypoglycemic symptoms- no  HYPERLIPIDEMIA Disease Monitoring: See symptoms for Hypertension Medications: Compliance- taking lipitor Right upper quadrant pain- no  Muscle aches- no  Patient does note after getting his flu shot last year his left arm was sore. Since then he notes intermittent catching and soreness in his posterior left shoulder that will occasionally come out of nowhere. He notes no injury. Does not bother him all the time. No pain at this time.   PMH: Former smoker   ROS see history of present illness  Objective  Physical Exam Vitals:   01/31/16 0830  BP: 120/80  Pulse: 67  Temp: 98.8 F (37.1 C)    BP Readings from Last 3 Encounters:  01/31/16 120/80  11/01/15 136/84  07/27/15 (!) 142/88   Wt Readings from Last 3 Encounters:  01/31/16 194 lb 6.4 oz (88.2 kg)  11/01/15 198 lb (89.8 kg)  07/27/15 206 lb 6.4 oz (93.6 kg)    Physical Exam  Constitutional: No distress.  HENT:  Head: Normocephalic and atraumatic.  Cardiovascular: Normal rate, regular rhythm and normal heart sounds.   Pulmonary/Chest: Effort normal and breath sounds normal.  Musculoskeletal: He exhibits no edema.  Patient notes a minimal catch on range of motion of the left shoulder, bilateral shoulders are symmetric and nontender, has  full range of motion actively and passively bilaterally  Neurological: He is alert. Gait normal.  Skin: Skin is warm and dry. He is not diaphoretic.     Assessment/Plan: Please see individual problem list.  Hypertension At goal. Continue current medications.  Hyperlipidemia Continue Lipitor.  Left shoulder pain Suspect muscular strain. Less likely related to the flu shot. Discussed exercises with patient. He will continue to monitor and if not improving could consider physical therapy.  Type 2 diabetes, uncontrolled, with neuropathy ([place]) Attempted to check point-of-care A1c though the machine stopped working. We'll have them draw an A1c in the lab. Will contact patient with the results. He'll continue his current medications at this time.   Orders Placed This Encounter  Procedures  . HgB A1c   [name], MD [place]

## 2016-01-31 NOTE — Assessment & Plan Note (Signed)
Attempted to check point-of-care A1c though the machine stopped working. We'll have them draw an A1c in the lab. Will contact patient with the results. He'll continue his current medications at this time.

## 2016-01-31 NOTE — Assessment & Plan Note (Signed)
At goal. Continue current medications. 

## 2016-01-31 NOTE — Progress Notes (Signed)
Pre visit review using our clinic review tool, if applicable. No additional management support is needed unless otherwise documented below in the visit note. 

## 2016-01-31 NOTE — Telephone Encounter (Signed)
Sent to pharmacy 

## 2016-01-31 NOTE — Assessment & Plan Note (Signed)
-  Continue Lipitor °

## 2016-02-01 ENCOUNTER — Other Ambulatory Visit: Payer: Self-pay | Admitting: Family Medicine

## 2016-02-01 MED ORDER — GLIPIZIDE 5 MG PO TABS: 5.0000 mg | ORAL_TABLET | Freq: Two times a day (BID) | ORAL | 3 refills | Status: DC

## 2016-02-07 ENCOUNTER — Telehealth: Payer: Self-pay | Admitting: Family Medicine

## 2016-02-07 NOTE — Telephone Encounter (Signed)
Left message informing patient he should still have 3 refills on metformin, glipizide was just sent to pharmacy 02/01/16

## 2016-02-07 NOTE — Telephone Encounter (Signed)
Pt called about after tonight he will not have anymore medication metFORMIN (GLUCOPHAGE) 1000 MG tablet and maybe glipiZIDE (GLUCOTROL) 5 MG tablet  pt is not sure of the other medication. Pt states that you would be aware of the medication that was raised from the last appt. Please advise?  Pharmacy is [id number] - [place], [place] - [place]  Call pt @ [phone]. Please leave msg if pt does not answer.

## 2016-05-01 ENCOUNTER — Ambulatory Visit (INDEPENDENT_AMBULATORY_CARE_PROVIDER_SITE_OTHER): Payer: Medicare Other | Admitting: Family Medicine

## 2016-05-01 ENCOUNTER — Encounter: Payer: Self-pay | Admitting: Family Medicine

## 2016-05-01 VITALS — BP 144/90 | HR 70 | Temp 98.5°F | Ht 63.5 in | Wt 201.4 lb

## 2016-05-01 DIAGNOSIS — I10 Essential (primary) hypertension: Secondary | ICD-10-CM | POA: Diagnosis not present

## 2016-05-01 DIAGNOSIS — G4730 Sleep apnea, unspecified: Secondary | ICD-10-CM

## 2016-05-01 DIAGNOSIS — R29890 Loss of height: Secondary | ICD-10-CM | POA: Insufficient documentation

## 2016-05-01 DIAGNOSIS — E1165 Type 2 diabetes mellitus with hyperglycemia: Secondary | ICD-10-CM | POA: Diagnosis not present

## 2016-05-01 DIAGNOSIS — E1140 Type 2 diabetes mellitus with diabetic neuropathy, unspecified: Secondary | ICD-10-CM

## 2016-05-01 DIAGNOSIS — IMO0002 Reserved for concepts with insufficient information to code with codable children: Secondary | ICD-10-CM

## 2016-05-01 LAB — COMPREHENSIVE METABOLIC PANEL
ALK PHOS: 61 U/L (ref 39–117)
ALT: 28 U/L (ref 0–53)
AST: 25 U/L (ref 0–37)
Albumin: 4.5 g/dL (ref 3.5–5.2)
BUN: 22 mg/dL (ref 6–23)
CHLORIDE: 99 meq/L (ref 96–112)
CO2: 31 meq/L (ref 19–32)
Calcium: 10.3 mg/dL (ref 8.4–10.5)
Creatinine, Ser: 1.29 mg/dL (ref 0.40–1.50)
GFR: 58.01 mL/min — ABNORMAL LOW (ref >60.00)
GLUCOSE: 227 mg/dL — AB (ref 70–99)
POTASSIUM: 3.8 meq/L (ref 3.5–5.1)
SODIUM: 139 meq/L (ref 135–145)
Total Bilirubin: 0.5 mg/dL (ref 0.2–1.2)
Total Protein: 7.5 g/dL (ref 6.0–8.3)

## 2016-05-01 LAB — HEMOGLOBIN A1C: Hgb A1c MFr Bld: 7.9 % — ABNORMAL HIGH (ref 4.6–6.5)

## 2016-05-01 NOTE — Assessment & Plan Note (Signed)
Not currently using his CPAP. He is willing to use it if he can make sure that it is clean enough. We'll have our referral coordinator check to see who his CPAP company is to see if somebody can come help him with learning how to clean the machine. I discussed the potential risks of untreated sleep apnea with him.

## 2016-05-01 NOTE — Assessment & Plan Note (Signed)
No noted history of osteoporosis. No osteoporosis noted on prior imaging results. He does not meet criteria for a DEXA scan for Medicare. He'll continue to monitor.

## 2016-05-01 NOTE — Patient Instructions (Signed)
Nice to see you. We'll check lab work and contact you with the results. We are going to check into getting somebody to help you with your CPAP machine as well.

## 2016-05-01 NOTE — Assessment & Plan Note (Signed)
Due for an A1c. We'll base any changes off of this.

## 2016-05-01 NOTE — Progress Notes (Signed)
  Shane Rumps, MD Phone: [phone]  ATA PECHA is a 71 y.o. male who presents today for follow-up.  HYPERTENSION  Disease Monitoring  Home BP Monitoring 125-140/70-80s Chest pain- no    Dyspnea- no Medications  Compliance-  Taking amlodipine, Lasix, losartan, metoprolol.  Edema- no  DIABETES Disease Monitoring: Blood Sugar ranges-1:30-160 Polyuria/phagia/dipsia- no      up-to-date on ophthalmology Medications: Compliance- taking metformin and glipizide Hypoglycemic symptoms- no  Sleep apnea: Patient notes he stopped using his CPAP as he is unsure if it was safe to use given that he is concerned about it not being clean enough to use. No hypersomnia. Notes he is not waking well rested.  Reports he is shrinking. He is down about an inch over the last year. No prior bone density test.  PMH: Former smoker   ROS see history of present illness  Objective  Physical Exam Vitals:   05/01/16 1055 05/01/16 1123  BP: (!) 180/102 (!) 144/90  Pulse: 70   Temp: 98.5 F (36.9 C)     BP Readings from Last 3 Encounters:  05/01/16 (!) 144/90  01/31/16 120/80  11/01/15 136/84   Wt Readings from Last 3 Encounters:  05/01/16 201 lb 6.4 oz (91.4 kg)  01/31/16 194 lb 6.4 oz (88.2 kg)  11/01/15 198 lb (89.8 kg)    Physical Exam  Constitutional: No distress.  Cardiovascular: Normal rate, regular rhythm and normal heart sounds.   Pulmonary/Chest: Effort normal and breath sounds normal.  Musculoskeletal: He exhibits no edema.  Neurological: He is alert. Gait normal.  Skin: Skin is warm and dry. He is not diaphoretic.     Assessment/Plan: Please see individual problem list.  Sleep apnea Not currently using his CPAP. He is willing to use it if he can make sure that it is clean enough. We'll have our referral coordinator check to see who his CPAP company is to see if somebody can come help him with learning how to clean the machine. I discussed the potential risks of untreated  sleep apnea with him.  Hypertension Elevated today though has been well controlled previously. We will not make any changes today. We'll have him return in 2 weeks for recheck. He'll continue checking at home.  Type 2 diabetes, uncontrolled, with neuropathy ([place]) Due for an A1c. We'll base any changes off of this.  Loss of height No noted history of osteoporosis. No osteoporosis noted on prior imaging results. He does not meet criteria for a DEXA scan for Medicare. He'll continue to monitor.   Orders Placed This Encounter  Procedures  . Comp Met (CMET)  . HgB A1c    Shane Rumps, MD [place]

## 2016-05-01 NOTE — Assessment & Plan Note (Signed)
Elevated today though has been well controlled previously. We will not make any changes today. We'll have him return in 2 weeks for recheck. He'll continue checking at home.

## 2016-05-01 NOTE — Progress Notes (Signed)
Pre visit review using our clinic review tool, if applicable. No additional management support is needed unless otherwise documented below in the visit note. 

## 2016-05-02 ENCOUNTER — Telehealth: Payer: Self-pay

## 2016-05-02 MED ORDER — EMPAGLIFLOZIN 10 MG PO TABS: 10.0000 mg | ORAL_TABLET | Freq: Every day | ORAL | 0 refills | Status: DC

## 2016-05-02 NOTE — Telephone Encounter (Signed)
Please call pt at [phone]

## 2016-05-02 NOTE — Telephone Encounter (Signed)
Left voice mail to call back 

## 2016-05-02 NOTE — Telephone Encounter (Signed)
Please confirm what insurance the patient has. He should not be taking the metformin 3 times daily. He should stick to twice daily. He should not take the glipizide 3 times daily. We could increase the dose of that rather than start him on another medication. If he would still like to be on the Jardiance I need to know what insurance he has. Thanks.

## 2016-05-02 NOTE — Addendum Note (Signed)
Addended by: Leone Haven on: 05/02/2016 06:15 PM   Modules accepted: Orders

## 2016-05-02 NOTE — Telephone Encounter (Signed)
Please call pt at [phone]

## 2016-05-02 NOTE — Telephone Encounter (Signed)
Jardiance was sent to his pharmacy. He needs to see how much this is going to cost is a Management consultant and then I can send in the mail order. If the cost is too much we can provide him with a coupon.

## 2016-05-02 NOTE — Telephone Encounter (Signed)
Patient advised of below and verbalized understandinga.  He states he has increased his glipizide 1 tid, and metformin 1 tid already without consulting you first   He is willing to take Jardiance.    Please advise.

## 2016-05-02 NOTE — Telephone Encounter (Signed)
-----   Message from Leone Haven, MD sent at 05/01/2016  4:55 PM EDT ----- Please let the patient know his A1c is unchanged from previously. We could go up on his glipizide or add another medication such as Jardiance to see if that would be beneficial. His other lab work is stable and acceptable. Thanks.

## 2016-05-02 NOTE — Telephone Encounter (Signed)
Patient advised of below, he is covered under Medicare, he uses [facility] for 30 day short term supply and mail order [facility].

## 2016-05-03 NOTE — Telephone Encounter (Signed)
Do you have coupon for Jardiance ? Patient would like to pick up for this month. I informed patient not sure if he could use with Medicare, he insisted on trying.

## 2016-05-03 NOTE — Telephone Encounter (Signed)
Left message to notify patient coupon is at front desk

## 2016-05-03 NOTE — Telephone Encounter (Signed)
Left voice mail to call back 

## 2016-05-03 NOTE — Telephone Encounter (Signed)
Patient was informed of Dr. Ellen Henri statement,he is currently checking the price of the medication and will return a call if he has issues. Pt contact [phone]

## 2016-05-03 NOTE — Telephone Encounter (Signed)
Patient stated that the [name] is feasible, however he stated that he would need a coupon for this month.  Please call pt when coupon is ready for pickup  Pt contact [phone]

## 2016-05-05 ENCOUNTER — Telehealth: Payer: Self-pay | Admitting: Family Medicine

## 2016-05-05 ENCOUNTER — Telehealth: Payer: Self-pay | Admitting: *Deleted

## 2016-05-05 MED ORDER — EMPAGLIFLOZIN 10 MG PO TABS: 10.0000 mg | ORAL_TABLET | Freq: Every day | ORAL | 1 refills | Status: DC

## 2016-05-05 NOTE — Telephone Encounter (Signed)
Patient would like for you to send in 90 days of jardiance to [facility]

## 2016-05-05 NOTE — Telephone Encounter (Signed)
Left pt message asking to call Allison back directly at [phone] to schedule AWV. Thanks! °

## 2016-05-05 NOTE — Telephone Encounter (Signed)
Patient has a question about his new medications for diabetes. Pt contact  [phone]

## 2016-05-05 NOTE — Telephone Encounter (Signed)
Sent to pharmacy 

## 2016-05-16 ENCOUNTER — Ambulatory Visit (INDEPENDENT_AMBULATORY_CARE_PROVIDER_SITE_OTHER): Payer: Medicare Other | Admitting: *Deleted

## 2016-05-16 VITALS — BP 134/88 | HR 62 | Resp 16

## 2016-05-16 DIAGNOSIS — I10 Essential (primary) hypertension: Secondary | ICD-10-CM | POA: Diagnosis not present

## 2016-05-16 NOTE — Progress Notes (Signed)
Patient's blood pressures are not at goal. He would benefit from an additional blood pressure medication to bring this down to goal. I would like to start him on hydrochlorothiazide. If he is willing I can send this to his pharmacy.   [name], M.D.

## 2016-05-16 NOTE — Progress Notes (Signed)
Patient presented for BP check two weeks post visit patient BP left arm 134/86 pulse 61 right arm 134/88 pulse 62 , blood pressure attained according to nursing standards.

## 2016-05-17 ENCOUNTER — Telehealth: Payer: Self-pay | Admitting: *Deleted

## 2016-05-17 MED ORDER — HYDROCHLOROTHIAZIDE 25 MG PO TABS: 12.5000 mg | ORAL_TABLET | Freq: Every day | ORAL | 3 refills | Status: DC

## 2016-05-17 MED ORDER — HYDROCHLOROTHIAZIDE 25 MG PO TABS: 12.5000 mg | ORAL_TABLET | Freq: Every day | ORAL | 0 refills | Status: DC

## 2016-05-17 NOTE — Progress Notes (Signed)
Sent to pharmacy. One month supply to local. 90 days to mail order. Patient will need a BMET in 2-3 weeks. Order has been placed. Please schedule.

## 2016-05-17 NOTE — Progress Notes (Signed)
Patient is willing to try HCTZ for his BP ask the initial 30 days be sent to [facility] and if long term send 90 day supply to [facility] mail order. 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

## 2016-05-17 NOTE — Telephone Encounter (Signed)
Called patient see clinical note.

## 2016-05-17 NOTE — Addendum Note (Signed)
Addended by: Leone Haven on: 05/17/2016 04:49 PM   Modules accepted: Orders

## 2016-05-17 NOTE — Telephone Encounter (Signed)
Patient returned Kathy's call, please call pt at [phone]

## 2016-05-17 NOTE — Progress Notes (Signed)
Left message for patient to return call to office. 

## 2016-05-18 ENCOUNTER — Other Ambulatory Visit: Payer: Self-pay | Admitting: Family Medicine

## 2016-05-18 NOTE — Progress Notes (Signed)
Lab appointment scheduled 

## 2016-06-05 ENCOUNTER — Other Ambulatory Visit (INDEPENDENT_AMBULATORY_CARE_PROVIDER_SITE_OTHER): Payer: Medicare Other

## 2016-06-05 DIAGNOSIS — I10 Essential (primary) hypertension: Secondary | ICD-10-CM

## 2016-06-05 LAB — BASIC METABOLIC PANEL
BUN: 20 mg/dL (ref 6–23)
CHLORIDE: 103 meq/L (ref 96–112)
CO2: 30 meq/L (ref 19–32)
CREATININE: 1.55 mg/dL — AB (ref 0.40–1.50)
Calcium: 10 mg/dL (ref 8.4–10.5)
GFR: 46.92 mL/min — ABNORMAL LOW (ref >60.00)
GLUCOSE: 216 mg/dL — AB (ref 70–99)
Potassium: 3.5 mEq/L (ref 3.5–5.1)
Sodium: 143 mEq/L (ref 135–145)

## 2016-06-06 ENCOUNTER — Telehealth: Payer: Self-pay | Admitting: Family Medicine

## 2016-06-06 ENCOUNTER — Other Ambulatory Visit: Payer: Self-pay

## 2016-06-06 ENCOUNTER — Other Ambulatory Visit: Payer: Self-pay | Admitting: Family Medicine

## 2016-06-06 DIAGNOSIS — N179 Acute kidney failure, unspecified: Secondary | ICD-10-CM

## 2016-06-06 MED ORDER — EMPAGLIFLOZIN 10 MG PO TABS: 10.0000 mg | ORAL_TABLET | Freq: Every day | ORAL | 0 refills | Status: DC

## 2016-06-06 MED ORDER — EMPAGLIFLOZIN 10 MG PO TABS: 10.0000 mg | ORAL_TABLET | Freq: Every day | ORAL | 1 refills | Status: DC

## 2016-06-06 NOTE — Telephone Encounter (Signed)
Pt called back in regards to labs. Please advise, thank you!  Call pt@ [phone]

## 2016-06-07 NOTE — Telephone Encounter (Signed)
Left pt message asking to call Allison back directly at [phone] to schedule AWV. Thanks! °

## 2016-06-07 NOTE — Telephone Encounter (Signed)
See result note patient notified.

## 2016-06-13 ENCOUNTER — Other Ambulatory Visit (INDEPENDENT_AMBULATORY_CARE_PROVIDER_SITE_OTHER): Payer: Medicare Other

## 2016-06-13 DIAGNOSIS — N179 Acute kidney failure, unspecified: Secondary | ICD-10-CM | POA: Diagnosis not present

## 2016-06-13 LAB — BASIC METABOLIC PANEL
BUN: 21 mg/dL (ref 6–23)
CHLORIDE: 103 meq/L (ref 96–112)
CO2: 31 meq/L (ref 19–32)
CREATININE: 1.42 mg/dL (ref 0.40–1.50)
Calcium: 9.7 mg/dL (ref 8.4–10.5)
GFR: 51.91 mL/min — ABNORMAL LOW (ref >60.00)
Glucose, Bld: 204 mg/dL — ABNORMAL HIGH (ref 70–99)
POTASSIUM: 3.7 meq/L (ref 3.5–5.1)
Sodium: 142 mEq/L (ref 135–145)

## 2016-06-14 ENCOUNTER — Telehealth: Payer: Self-pay

## 2016-06-14 NOTE — Telephone Encounter (Signed)
Patient notified

## 2016-06-14 NOTE — Telephone Encounter (Signed)
-----   Message from Leone Haven, MD sent at 06/13/2016  5:02 PM EDT ----- Please let the patient know that his kidney function is slightly improved. We'll need to continue to monitor this. Thanks.

## 2016-06-14 NOTE — Telephone Encounter (Signed)
Left message to return call 

## 2016-07-13 ENCOUNTER — Other Ambulatory Visit: Payer: Self-pay | Admitting: Family Medicine

## 2016-07-14 ENCOUNTER — Other Ambulatory Visit: Payer: Self-pay

## 2016-07-14 MED ORDER — LOSARTAN POTASSIUM 100 MG PO TABS: ORAL_TABLET | ORAL | 0 refills | Status: DC

## 2016-07-14 MED ORDER — METOPROLOL SUCCINATE ER 100 MG PO TB24: ORAL_TABLET | ORAL | 0 refills | Status: DC

## 2016-07-14 MED ORDER — ALLOPURINOL 100 MG PO TABS: ORAL_TABLET | ORAL | 0 refills | Status: DC

## 2016-07-14 MED ORDER — AMLODIPINE BESYLATE 10 MG PO TABS: ORAL_TABLET | ORAL | 0 refills | Status: DC

## 2016-07-14 MED ORDER — NABUMETONE 500 MG PO TABS: 500.0000 mg | ORAL_TABLET | Freq: Two times a day (BID) | ORAL | 3 refills | Status: DC

## 2016-07-14 MED ORDER — FUROSEMIDE 20 MG PO TABS: 20.0000 mg | ORAL_TABLET | Freq: Every day | ORAL | 0 refills | Status: DC

## 2016-07-14 NOTE — Telephone Encounter (Signed)
Rx request Nabumetone 500 mg Last OV 4/40/2018 Next OV 07/31/2016 Last refilled 06/07/2015

## 2016-07-25 ENCOUNTER — Other Ambulatory Visit: Payer: Self-pay

## 2016-07-25 NOTE — Telephone Encounter (Signed)
Last OV 05/01/16 last filled Nabumetone 07/14/16 180 3rf Losartan 07/14/16 Furosemide 07/14/16 Already filled

## 2016-07-31 ENCOUNTER — Ambulatory Visit (INDEPENDENT_AMBULATORY_CARE_PROVIDER_SITE_OTHER): Payer: Medicare Other | Admitting: Family Medicine

## 2016-07-31 ENCOUNTER — Encounter: Payer: Self-pay | Admitting: Family Medicine

## 2016-07-31 VITALS — BP 138/62 | HR 79 | Temp 99.5°F | Resp 16 | Ht 65.0 in | Wt 189.8 lb

## 2016-07-31 DIAGNOSIS — R350 Frequency of micturition: Secondary | ICD-10-CM

## 2016-07-31 DIAGNOSIS — E1165 Type 2 diabetes mellitus with hyperglycemia: Secondary | ICD-10-CM

## 2016-07-31 DIAGNOSIS — I10 Essential (primary) hypertension: Secondary | ICD-10-CM

## 2016-07-31 DIAGNOSIS — IMO0002 Reserved for concepts with insufficient information to code with codable children: Secondary | ICD-10-CM

## 2016-07-31 DIAGNOSIS — E1140 Type 2 diabetes mellitus with diabetic neuropathy, unspecified: Secondary | ICD-10-CM

## 2016-07-31 DIAGNOSIS — E785 Hyperlipidemia, unspecified: Secondary | ICD-10-CM | POA: Diagnosis not present

## 2016-07-31 LAB — POCT URINALYSIS DIPSTICK
Bilirubin, UA: NEGATIVE
Blood, UA: NEGATIVE
Glucose, UA: NEGATIVE
Ketones, UA: NEGATIVE
LEUKOCYTES UA: NEGATIVE
Nitrite, UA: NEGATIVE
PROTEIN UA: 30
Spec Grav, UA: <=1.005 — AB (ref 1.010–1.025)
UROBILINOGEN UA: 0.2 U/dL
pH, UA: 5.5 (ref 5.0–8.0)

## 2016-07-31 NOTE — Progress Notes (Signed)
  [name], MD Phone: [phone]  Shane Jones is a 71 y.o. male who presents today for follow-up.  HYPERTENSION Disease Monitoring: Blood pressure range-121-127/70s Chest pain- no      Dyspnea- no Medications: Compliance- taking amlodipine, Lasix, HCTZ, losartan, metoprolol  Edema- no  DIABETES Disease Monitoring: Blood Sugar ranges-295 this morning, typically 1:30 fasting Polyuria/phagia/dipsia- some polyuria      saw ophthalmology in March Medications: Compliance- taking Jardiance, glipizide, metformin Hypoglycemic symptoms- no  HYPERLIPIDEMIA Disease Monitoring: See symptoms for Hypertension Medications: Compliance- taking Lipitor Right upper quadrant pain- no  Muscle aches- no   PMH: Former smoker   ROS see history of present illness  Objective  Physical Exam Vitals:   07/31/16 1348  BP: 138/62  Pulse: 79  Resp: 16  Temp: 99.5 F (37.5 C)    BP Readings from Last 3 Encounters:  07/31/16 138/62  05/16/16 134/88  05/01/16 (!) 144/90   Wt Readings from Last 3 Encounters:  07/31/16 189 lb 12.8 oz (86.1 kg)  05/01/16 201 lb 6.4 oz (91.4 kg)  01/31/16 194 lb 6.4 oz (88.2 kg)    Physical Exam  Constitutional: No distress.  Cardiovascular: Normal rate, regular rhythm and normal heart sounds.   Pulmonary/Chest: Effort normal and breath sounds normal.  Musculoskeletal: He exhibits no edema.  Neurological: He is alert. Gait normal.  Skin: Skin is warm and dry. He is not diaphoretic.     Assessment/Plan: Please see individual problem list.  Hypertension Well-controlled at home. Continue current medications. Lab work as outlined below.  Type 2 diabetes, uncontrolled, with neuropathy ([place]) Continues to have uncontrolled blood glucoses. He'll continue his current medications. We'll have him see the pharmacist in follow-up in 2 weeks. He'll return for fasting lab work. Check UA today given polyuria.  Hyperlipidemia Return for fasting lab work.  Continue Lipitor.   Orders Placed This Encounter  Procedures  . HgB A1c    Standing Status:   Future    Standing Expiration Date:   07/31/2017  . Lipid panel    Standing Status:   Future    Standing Expiration Date:   07/31/2017  . Comp Met (CMET)    Standing Status:   Future    Standing Expiration Date:   07/31/2017  . POC Urinalysis Dipstick   [name], MD [place]

## 2016-07-31 NOTE — Assessment & Plan Note (Signed)
Return for fasting lab work.  Continue Lipitor. 

## 2016-07-31 NOTE — Progress Notes (Signed)
Saw Shane Jones for medication-related questions at request of patient.   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   It seems that patient is adherent to his medications but does not have a good understanding of dietary modifications for diabetes or importance of exercise.   Extensive dietary counseling and My Plate hand out provided to patient.  Submitted question to [place] [employer] re:GLP-1 agonists as they are NOT insulins but are injectable.  Scheduled pharmacy clinic appointment in 2 weeks.  Carlean Jews, Pharm.D. PGY2 Ambulatory Care Pharmacy Resident Phone: [phone]

## 2016-07-31 NOTE — Patient Instructions (Signed)
Nice to see you. We will have you return for fasting lab work.  

## 2016-07-31 NOTE — Assessment & Plan Note (Signed)
Continues to have uncontrolled blood glucoses. He'll continue his current medications. We'll have him see the pharmacist in follow-up in 2 weeks. He'll return for fasting lab work. Check UA today given polyuria.

## 2016-07-31 NOTE — Assessment & Plan Note (Signed)
Well-controlled at home. Continue current medications. Lab work as outlined below.

## 2016-08-01 LAB — URINALYSIS, MICROSCOPIC ONLY: RBC / HPF: NONE SEEN (ref 0–?)

## 2016-08-01 NOTE — Addendum Note (Signed)
Addended by: Arby Barrette on: 08/01/2016 07:39 AM   Modules accepted: Orders

## 2016-08-01 NOTE — Progress Notes (Addendum)
Called the [place] [employer] to inquire about restricted medications for treatment of diabetes. Per the [place] DOT medical advice RN, GLP-1 agonists would not cause restricted driving privileges for any patient. This is a viable treatment option for Shane Jones in the future.   Called patient to relay this information but no answer. Will see him in 2 weeks and will explore treatment options then.   Carlean Jews, Pharm.D. PGY2 Ambulatory Care Pharmacy Resident Phone: [phone]

## 2016-08-07 ENCOUNTER — Other Ambulatory Visit (INDEPENDENT_AMBULATORY_CARE_PROVIDER_SITE_OTHER): Payer: Medicare Other

## 2016-08-07 DIAGNOSIS — E785 Hyperlipidemia, unspecified: Secondary | ICD-10-CM | POA: Diagnosis not present

## 2016-08-07 DIAGNOSIS — E1165 Type 2 diabetes mellitus with hyperglycemia: Secondary | ICD-10-CM

## 2016-08-07 DIAGNOSIS — E1140 Type 2 diabetes mellitus with diabetic neuropathy, unspecified: Secondary | ICD-10-CM | POA: Diagnosis not present

## 2016-08-07 DIAGNOSIS — I10 Essential (primary) hypertension: Secondary | ICD-10-CM | POA: Diagnosis not present

## 2016-08-07 DIAGNOSIS — IMO0002 Reserved for concepts with insufficient information to code with codable children: Secondary | ICD-10-CM

## 2016-08-07 LAB — LIPID PANEL
CHOLESTEROL: 117 mg/dL (ref <200)
HDL: 31 mg/dL — AB (ref >40)
LDL CALC: 23 mg/dL (ref <100)
TRIGLYCERIDES: 316 mg/dL — AB (ref <150)
Total CHOL/HDL Ratio: 3.8 Ratio (ref <5.0)
VLDL: 63 mg/dL — AB (ref <30)

## 2016-08-07 LAB — COMPREHENSIVE METABOLIC PANEL
ALK PHOS: 70 U/L (ref 40–115)
ALT: 24 U/L (ref 9–46)
AST: 23 U/L (ref 10–35)
Albumin: 4.3 g/dL (ref 3.6–5.1)
BUN: 20 mg/dL (ref 7–25)
CHLORIDE: 101 mmol/L (ref 98–110)
CO2: 29 mmol/L (ref 20–32)
CREATININE: 1.45 mg/dL — AB (ref 0.70–1.18)
Calcium: 9.9 mg/dL (ref 8.6–10.3)
GLUCOSE: 181 mg/dL — AB (ref 65–99)
Potassium: 3.8 mmol/L (ref 3.5–5.3)
Sodium: 141 mmol/L (ref 135–146)
TOTAL PROTEIN: 7.2 g/dL (ref 6.1–8.1)
Total Bilirubin: 0.9 mg/dL (ref 0.2–1.2)

## 2016-08-07 NOTE — Addendum Note (Signed)
Addended by: Arby Barrette on: 08/07/2016 09:59 AM   Modules accepted: Orders

## 2016-08-08 LAB — HEMOGLOBIN A1C
HEMOGLOBIN A1C: 8.5 % — AB (ref <5.7)
Mean Plasma Glucose: 197 mg/dL

## 2016-08-14 ENCOUNTER — Telehealth: Payer: Self-pay | Admitting: Family Medicine

## 2016-08-14 ENCOUNTER — Encounter: Payer: Self-pay | Admitting: Pharmacist

## 2016-08-14 ENCOUNTER — Ambulatory Visit (INDEPENDENT_AMBULATORY_CARE_PROVIDER_SITE_OTHER): Payer: Medicare Other | Admitting: Pharmacist

## 2016-08-14 VITALS — BP 123/84 | HR 65 | Wt 190.0 lb

## 2016-08-14 DIAGNOSIS — IMO0002 Reserved for concepts with insufficient information to code with codable children: Secondary | ICD-10-CM

## 2016-08-14 DIAGNOSIS — E1165 Type 2 diabetes mellitus with hyperglycemia: Secondary | ICD-10-CM | POA: Diagnosis not present

## 2016-08-14 DIAGNOSIS — E1140 Type 2 diabetes mellitus with diabetic neuropathy, unspecified: Secondary | ICD-10-CM | POA: Diagnosis not present

## 2016-08-14 MED ORDER — EMPAGLIFLOZIN 25 MG PO TABS: 25.0000 mg | ORAL_TABLET | Freq: Every day | ORAL | 11 refills | Status: DC

## 2016-08-14 MED ORDER — DULAGLUTIDE 0.75 MG/0.5ML ~~LOC~~ SOAJ: 0.7500 mg | SUBCUTANEOUS | 11 refills | Status: DC

## 2016-08-14 NOTE — Assessment & Plan Note (Signed)
Diabetes longstanding currently uncontrolled with last A1C 8.5% (08/07/2016). Patient denies hypoglycemic events and is able to verbalize appropriate hypoglycemia management plan. Patient reports adherence with medication. Control is suboptimal due to dietary indiscretion, sedentary lifestyle, and insulin resistance. Denies recent or history of UTI. Patient is in donut hole presently. Following discussion and approval by Dr. Caryl Bis, the following medication changes were made:  -Discontinue glipizide as patient is likely no longer benefiting from this medication given that he has been on it for > 5 years  -Increase Jardiance 25 mg daily pending patient assistance application approval - Start Trulicity 3.26 mg once weekly pending patient assistance application approval. 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  -Patient is to bring his financial information into the office to complete patient assistance applications -Next Z1I anticipated November, 2018.

## 2016-08-14 NOTE — Telephone Encounter (Signed)
Patient asking about a change in his prescription. He wasn't sure if it was going to be changed. He is also wanting to know if he needs to follow up with Dr. Caryl Bis as well in a month.  Pt cb [phone]

## 2016-08-14 NOTE — Assessment & Plan Note (Signed)
  Hypertension longstanding currently controlled with BP 123/84 today.  Patient reports adherence with medication.  -Continue all other medications

## 2016-08-14 NOTE — Progress Notes (Signed)
    S:     Chief Complaint  Patient presents with  . Medication Management    diabetes   Patient arrives in good spirits, ambulating without assistance.  Presents for diabetes evaluation, education, and management at the request of Dr. Josephina Gip. Patient was referred on 07/31/2016.  Patient was last seen by Primary Care Provider on 07/31/2016.  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 Current diabetes medications include: glipizide 5 mg BID, metformin 1000 mg BID, jardiance 10 mg daily Current hypertension medications include: amlodipine 10 mg daily, HCTZ 25 mg daily, furosemide 20 mg daily, losartan 100 mg daily, metoprolol succinate 100 mg daily  Patient denies hypoglycemic events. Reports 71 mg/dL once several months ago.  Patient reported dietary habits: Eats 3 meals/day Breakfast: tomatoes, boiled or fried eggs, no salt Lunch: pork sandwich with lettuce and tomatoes Dinner: spaghetti (does this ~once a week) Snacks: trying to limit - reported none Drinks: water  Patient reported exercise habits:none at present, in the process of signing up for [employer], planning to go 3x a week   Patient reports nocturia. 2-3x a night, denies dysuria Patient reports neuropathy. Currently on gabapentin  Patient denies visual changes. Patient denies self foot exams.   O:  Physical Exam  Constitutional: He appears well-developed and well-nourished.   Review of Systems  All other systems reviewed and are negative.  Lab Results  Component Value Date   HGBA1C 8.5 (H) 08/07/2016   Vitals:   08/14/16 1301  BP: 123/84  Pulse: 65    Home fasting CBG: 200s  10 year ASCVD risk: 41%. (assuming TC is 130, LDL 30 as his current readings are too low for calculator)   A/P: Diabetes longstanding currently uncontrolled with last A1C  8.5% (08/07/2016). Patient denies hypoglycemic events and is able to verbalize appropriate hypoglycemia management plan. Patient reports adherence with medication. Control is suboptimal due to dietary indiscretion, sedentary lifestyle, and insulin resistance. Denies recent or history of UTI. Patient is in donut hole presently. Following discussion and approval by Dr. Caryl Bis, the following medication changes were made:  -Discontinue glipizide as patient is likely no longer benefiting from this medication given that he has been on it for > 5 years  -Increase Jardiance 25 mg daily pending patient assistance application approval - Start Trulicity 6.31 mg once weekly pending patient assistance application approval. 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  -Patient is to bring his financial information into the office to complete patient assistance applications -Next S9F anticipated November, 2018.    ASCVD risk greater than 7.5%. Continued Aspirin 325 mg and Continued atrovastatin 40 mg.   Hypertension longstanding currently controlled with BP 123/84 today.  Patient reports adherence with medication.  -Continue all other medications  Patient was seen with Dr. Caryl Bis today in clinic and medication changes were discussed and approved prior to initiation  Written patient instructions provided. Total time in face to face counseling 60 minutes.   Follow up in Pharmacist Clinic Visit in 4 weeks.   Patient seen with Shelle Iron, PharmD [place], Pharm.D. PGY2 Ambulatory Care Pharmacy Resident Phone: [phone]

## 2016-08-14 NOTE — Telephone Encounter (Signed)
Called patient back to explain med changes. Left message as no answer.   Ideally, we will stop glipizide, increase jardiance to 25 mg daily, and start trulicity.   Patient is in the donut hole unfortunately and his medications are unaffordable. Started [employer] for both Trulicity and Jardiance today. Still need patient's financial information to send off the applications.   Until he is able to get medication from the companies (pending approval) we will keep his medications the same. Continue glipizide and jardiance 10 mg in the meantime.   Carlean Jews, Pharm.D. PGY2 Ambulatory Care Pharmacy Resident Phone: [phone]

## 2016-08-14 NOTE — Progress Notes (Signed)
I have reviewed the above note and agree. Please ensure that patient has no history of medullary thyroid cancer or men 2 cancers in his family or himself prior to starting Trulicity.  [name], M.D.

## 2016-08-14 NOTE — Patient Instructions (Addendum)
Thanks for coming to see Shane Jones today!   INCREASE your Jardiance to 25 mg daily  START Trulicity 6.76 mg once a week  STOP glipizide.   Test your blood sugar every morning and a few times throughout the day a couple times each week.  Come back to see Shane Jones in 1 month.

## 2016-08-15 NOTE — Progress Notes (Signed)
Called Shane Jones, patient denies personal and family hx of medullary thyroid cancer or MEN 2 cancer.   Carlean Jews, Pharm.D. PGY2 Ambulatory Care Pharmacy Resident Phone: [phone]

## 2016-08-21 ENCOUNTER — Other Ambulatory Visit: Payer: Self-pay | Admitting: Family Medicine

## 2016-08-21 MED ORDER — DULAGLUTIDE 0.75 MG/0.5ML ~~LOC~~ SOAJ: 0.7500 mg | SUBCUTANEOUS | 11 refills | Status: DC

## 2016-08-21 MED ORDER — EMPAGLIFLOZIN 25 MG PO TABS: 25.0000 mg | ORAL_TABLET | Freq: Every day | ORAL | 11 refills | Status: DC

## 2016-09-06 ENCOUNTER — Other Ambulatory Visit: Payer: Self-pay | Admitting: Family Medicine

## 2016-09-06 MED ORDER — METFORMIN HCL 1000 MG PO TABS: 1000.0000 mg | ORAL_TABLET | Freq: Two times a day (BID) | ORAL | 3 refills | Status: DC

## 2016-09-06 MED ORDER — HYDROCHLOROTHIAZIDE 25 MG PO TABS: 12.5000 mg | ORAL_TABLET | Freq: Every day | ORAL | 3 refills | Status: DC

## 2016-09-06 MED ORDER — GABAPENTIN 400 MG PO CAPS: 400.0000 mg | ORAL_CAPSULE | Freq: Three times a day (TID) | ORAL | 3 refills | Status: DC

## 2016-09-06 NOTE — Telephone Encounter (Signed)
Last OV 07/31/16 last filled Gabapentin by Dr.Walker 04/27/15 270 3rf Hydrochlorothiazide 05/17/16 45 3rf Metformin 12/02/15 180 3rf

## 2016-09-06 NOTE — Telephone Encounter (Signed)
Pt called and stated that he is completely out of medication. Pt is needing metFORMIN (GLUCOPHAGE) 1000 MG tablet, hydrochlorothiazide (HYDRODIURIL) 25 MG tablet, and gabapentin (NEURONTIN) 400 MG capsule. Please advise, thank you!  [facility] - [place], [place]  Call pt @ [phone]

## 2016-09-11 ENCOUNTER — Other Ambulatory Visit: Payer: Self-pay

## 2016-09-11 ENCOUNTER — Encounter: Payer: Self-pay | Admitting: Pharmacist

## 2016-09-11 DIAGNOSIS — E1165 Type 2 diabetes mellitus with hyperglycemia: Principal | ICD-10-CM

## 2016-09-11 DIAGNOSIS — IMO0002 Reserved for concepts with insufficient information to code with codable children: Secondary | ICD-10-CM

## 2016-09-11 DIAGNOSIS — E1140 Type 2 diabetes mellitus with diabetic neuropathy, unspecified: Secondary | ICD-10-CM

## 2016-09-11 MED ORDER — ALLOPURINOL 100 MG PO TABS: ORAL_TABLET | ORAL | Status: DC

## 2016-09-11 MED ORDER — LOSARTAN POTASSIUM 100 MG PO TABS: ORAL_TABLET | ORAL | 2 refills | Status: DC

## 2016-09-11 MED ORDER — AMLODIPINE BESYLATE 10 MG PO TABS: ORAL_TABLET | ORAL | 2 refills | Status: DC

## 2016-09-11 MED ORDER — ATORVASTATIN CALCIUM 40 MG PO TABS: ORAL_TABLET | ORAL | 3 refills | Status: DC

## 2016-09-11 NOTE — Progress Notes (Signed)
Patient arrives in office to supply out-of-pocket drug spend year to date for patient assistance applications. Will send applications today. Samples of trulicity and jardiance supplied.   1. Medication Samples have been provided to the patient.  Drug name: Trulicity       Strength: 0.75 mg        Qty: 2  LOT: [id number]  Exp.Date: 02/01/2018  Dosing instructions: Inject 1 pen once weekly  The patient has been instructed regarding the correct time, dose, and frequency of taking this medication, including desired effects and most common side effects.   2. Medication Samples have been provided to the patient.  Drug name: Jardiance       Strength: 10 mg        Qty: 28  LOT: [id number]  Exp.Date: 11/02/2018  Dosing instructions: Take 2 tablets (20 mg) by mouth once daily until gone, then change to 25 mg dose if received from drug company  The patient has been instructed regarding the correct time, dose, and frequency of taking this medication, including desired effects and most common side effects. Counseled on sick day rules for Jardiance.    Patient has appointment with me next week. Will follow up tolerance of medications at that time.  Carlean Jews, Pharm.D. PGY2 Ambulatory Care Pharmacy Resident Phone: [phone]

## 2016-09-18 ENCOUNTER — Telehealth: Payer: Self-pay | Admitting: Pharmacist

## 2016-09-18 ENCOUNTER — Encounter: Payer: Self-pay | Admitting: Pharmacist

## 2016-09-18 ENCOUNTER — Ambulatory Visit (INDEPENDENT_AMBULATORY_CARE_PROVIDER_SITE_OTHER): Payer: Medicare Other | Admitting: Pharmacist

## 2016-09-18 VITALS — BP 112/76 | HR 71 | Wt 189.0 lb

## 2016-09-18 DIAGNOSIS — E1165 Type 2 diabetes mellitus with hyperglycemia: Secondary | ICD-10-CM | POA: Diagnosis not present

## 2016-09-18 DIAGNOSIS — IMO0002 Reserved for concepts with insufficient information to code with codable children: Secondary | ICD-10-CM

## 2016-09-18 DIAGNOSIS — G8929 Other chronic pain: Secondary | ICD-10-CM | POA: Diagnosis not present

## 2016-09-18 DIAGNOSIS — M549 Dorsalgia, unspecified: Secondary | ICD-10-CM

## 2016-09-18 DIAGNOSIS — I10 Essential (primary) hypertension: Secondary | ICD-10-CM

## 2016-09-18 DIAGNOSIS — E1140 Type 2 diabetes mellitus with diabetic neuropathy, unspecified: Secondary | ICD-10-CM | POA: Diagnosis not present

## 2016-09-18 LAB — BASIC METABOLIC PANEL
BUN: 22 mg/dL (ref 6–23)
CHLORIDE: 102 meq/L (ref 96–112)
CO2: 32 mEq/L (ref 19–32)
Calcium: 10.1 mg/dL (ref 8.4–10.5)
Creatinine, Ser: 1.61 mg/dL — ABNORMAL HIGH (ref 0.40–1.50)
GFR: 44.87 mL/min — ABNORMAL LOW (ref >60.00)
Glucose, Bld: 179 mg/dL — ABNORMAL HIGH (ref 70–99)
POTASSIUM: 3.3 meq/L — AB (ref 3.5–5.1)
Sodium: 143 mEq/L (ref 135–145)

## 2016-09-18 NOTE — Addendum Note (Signed)
Addended by: Leone Haven on: 09/18/2016 05:53 PM   Modules accepted: Level of Service

## 2016-09-18 NOTE — Progress Notes (Signed)
  [name], MD Phone: [phone]  Shane Jones is a 71 y.o. male who presents today for same-day visit.  I have reviewed the note and agree with the plan from the pharmacist. Additionally the patient complained of low back pain. Notes this has been going on for several weeks. Some days it hurts and some days it doesn't. He wonders if it's related to his medication for diabetes. No radiation. No numbness or weakness. No loss of bowel or bladder function. No specific injury.  ROS see history of present illness  Objective  Physical Exam Vitals:   09/18/16 1315  BP: 112/76  Pulse: 71    BP Readings from Last 3 Encounters:  09/18/16 112/76  08/14/16 123/84  07/31/16 138/62   Wt Readings from Last 3 Encounters:  09/18/16 189 lb (85.7 kg)  08/14/16 190 lb (86.2 kg)  07/31/16 189 lb 12.8 oz (86.1 kg)    Physical Exam  Constitutional: No distress.  Pulmonary/Chest: Effort normal.  Musculoskeletal:  No midline spine tenderness, no midline spine step-off, no muscular back tenderness  Neurological: He is alert. Gait normal.  5/5 strength bilateral quads, hamstrings, plantar flexion, and dorsiflexion, sensation to light touch intact bilaterally lower extremities  Skin: He is not diaphoretic.     Assessment/Plan: Please see individual problem list.  Chronic back pain: Patient with what sounds to be an exacerbation of his chronic back pain. Benign exam today. He is neurologically intact in his lower extremities. It is not bothering him today. Patient mostly concerned about his medications and his kidneys. We'll check a BMP. He'll monitor for recurrence.   [name], MD [place]

## 2016-09-18 NOTE — Telephone Encounter (Signed)
Patient approved through [employer] for Trulicity 7.03 mg weekly through 01/01/2017. Shipments have not been made 2/2 inclement weather. Case ID =  [id number]. Will follow up later this week.   Jardiance application for patient assistance denied based on income as the company states that the patient can make no more than $4115/month for a household size of two. Will re-review the application and re-submit if able.Carlean Jews, Pharm.D. PGY2 Ambulatory Care Pharmacy Resident Phone: [phone]

## 2016-09-18 NOTE — Assessment & Plan Note (Signed)
#  Complaint of flank pain - discussed with Dr. Caryl Bis. Patient examined by Dr. Caryl Bis.  -See note

## 2016-09-18 NOTE — Progress Notes (Signed)
S:     Chief Complaint  Patient presents with  . Medication Management    Diabetes  . Medication Assistance    Trulicity, Jardiance    Patient arrives in excellent spirits, ambulating without assistance.  Presents for diabetes evaluation, education, and management at the request of Dr. Caryl Bis. Patient was referred on 07/31/2016.  Patient was last seen by Primary Care Provider on 07/31/2016. Seeing patient as follow up to Rx clinic visit on 08/14/2016. At that time, glipizide was d/c'd, trulicity was started and jardiance was increased - Trulicity and Jardiance samples were provided to patient.   Complains of flank pain, denies pain/burning on urination, no fevers. Just got metformin in the mail, has been taking glipizide since he has been out. Was out for 4 weeks. Reports CBG was 115 this morning. Patient inquires whether he can have his thyroid levels checked.   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  Patient reports adherence with medications now that they have been mailed to him. Current diabetes medications include: Metformin 1000 mg BID, glipizide 5 mg BID, Trulicity 1.61 mg weekly, Jardiance 20 mg daily.  Current hypertension medications include: amlodipine 10 mg daily, HCTZ 25 mg daily, furosemide 20 mg daily, losartan 100 mg daily, metoprolol succinate 100 mg daily  Patient denies hypoglycemic events.  Patient reported dietary habits: Eats 2 meals/day  Breakfast: boiled eggs Lunch: skips usually Dinner: hamburger Snacks: rarely snacks - cheese crackers Drinks: water  Patient reported exercise habits: started back at the gym three times/week, has not gone this week given the weather   Patient denies nocturia.  Patient reports neuropathy. Improved some days.  Patient denies visual changes. Patient reports self foot exams.    O:  Physical Exam  Constitutional: He appears well-developed and well-nourished.  Vitals  reviewed.   Review of Systems  All other systems reviewed and are negative.    Lab Results  Component Value Date   HGBA1C 8.5 (H) 08/07/2016   Vitals:   09/18/16 1315  BP: 112/76  Pulse: 71   CBG on stick = 150 in office  Random BG tests: 180 or higher --> this morning was 115 per patient  A/P: #Diabetes longstanding/newly diagnosed currently uncontrolled per A1C and CBG log but improved. Patient denies hypoglycemic events and is able to verbalize appropriate hypoglycemia management plan. Patient reports adherence with medication. Control is suboptimal due to dietary indiscretion, suspected insulin resistance. Following discussion and approval by Dr. Caryl Bis, the following medication changes were made:  -Gave sample of Jardiance 10 mg tablets, take 2 tablets (20 mg) PO daily until gone. PAP denied for Jardiance but suspect this was done in error. Will follow up.  Medication Samples have been provided to the patient. Drug name: Jardiance   Strength: 10mg         Qty: 28 tabs   LOT: [id number] Exp.Date: 11/02/2018 Dosing instructions: Take 2 tablets (20 mg) by mouth daily -Restart glipizide 5 mg BID given improved control on when patient-directed re-start -Continue all other medications at this time. Plan to increase Trulicity to 1.5 mg weekly once shipment from [employer] arrives as long as he continues tolerating 0.75 mg dose. Limited by finances at this time. -BMET today  Next A1C anticipated 11/07/16 or later.    #ASCVD risk greater than 7.5%. Continued Aspirin 325 mg and Continued atorvastatin 40 mg.   #Hypertension longstanding currently controlled.  Patient reports adherence with medication. -Continue current meds  #Labs request -  Discussed with Dr. Caryl Bis -BMET today per Dr. Caryl Bis -Will address thyroid labs at next MD visit  #Complaint of flank pain - discussed with Dr. Caryl Bis. Patient examined by Dr. Caryl Bis.  -See note  Patient was seen with Dr. Caryl Bis  today in clinic and medication changes were discussed and approved prior to initiation   Written patient instructions provided.  Total time in face to face counseling 45 minutes.   Follow up in Pharmacist Clinic Visit 4 weeks.     Carlean Jews, Pharm.D. PGY2 Ambulatory Care Pharmacy Resident Phone: [phone]

## 2016-09-18 NOTE — Assessment & Plan Note (Signed)
#  Hypertension longstanding currently controlled.  Patient reports adherence with medication. -Continue current meds

## 2016-09-18 NOTE — Assessment & Plan Note (Signed)
#  Diabetes longstanding/newly diagnosed currently uncontrolled per A1C and CBG log but improved. Patient denies hypoglycemic events and is able to verbalize appropriate hypoglycemia management plan. Patient reports adherence with medication. Control is suboptimal due to dietary indiscretion, suspected insulin resistance. Following discussion and approval by Dr. Caryl Bis, the following medication changes were made:  -Gave sample of Jardiance 10 mg tablets, take 2 tablets (20 mg) PO daily until gone. PAP denied for Jardiance but suspect this was done in error. Will follow up.  Medication Samples have been provided to the patient. Drug name: Jardiance   Strength: 10mg         Qty: 28 tabs   LOT: [id number] Exp.Date: 11/02/2018 Dosing instructions: Take 2 tablets (20 mg) by mouth daily -Restart glipizide 5 mg BID given improved control on when patient-directed re-start -Continue all other medications at this time. Plan to increase Trulicity to 1.5 mg weekly once shipment from [employer] arrives as long as he continues tolerating 0.75 mg dose. Limited by finances at this time. -BMET today  Next A1C anticipated 11/07/16 or later.    #ASCVD risk greater than 7.5%. Continued Aspirin 325 mg and Continued atorvastatin 40 mg.

## 2016-09-18 NOTE — Patient Instructions (Addendum)
Thank you for coming to see [name] today! Don't be discouraged by your blood sugar numbers - you are doing better than before!   1. Continue taking metformin twice a day, glipizide twice a day, Trulicity once a week, Jardiance 10 mg - TWO pills once a day.   2. If you are ever sick/dehydrated/unable to keep fluids or food down, do not take your Jardiance.   3. Trulicity should be shipped to the office hopefully next week. I will call you when I know more.    Call me on my work cell if you have any issues - [phone]

## 2016-09-19 ENCOUNTER — Ambulatory Visit (INDEPENDENT_AMBULATORY_CARE_PROVIDER_SITE_OTHER): Payer: Medicare Other | Admitting: Family Medicine

## 2016-09-19 ENCOUNTER — Encounter: Payer: Self-pay | Admitting: Family Medicine

## 2016-09-19 VITALS — BP 142/88 | HR 65 | Temp 98.2°F | Wt 189.8 lb

## 2016-09-19 DIAGNOSIS — F329 Major depressive disorder, single episode, unspecified: Secondary | ICD-10-CM

## 2016-09-19 DIAGNOSIS — E876 Hypokalemia: Secondary | ICD-10-CM | POA: Diagnosis not present

## 2016-09-19 DIAGNOSIS — F419 Anxiety disorder, unspecified: Secondary | ICD-10-CM | POA: Diagnosis not present

## 2016-09-19 DIAGNOSIS — F32A Depression, unspecified: Secondary | ICD-10-CM | POA: Insufficient documentation

## 2016-09-19 MED ORDER — ESCITALOPRAM OXALATE 10 MG PO TABS: 10.0000 mg | ORAL_TABLET | Freq: Every day | ORAL | 3 refills | Status: DC

## 2016-09-19 MED ORDER — POTASSIUM CHLORIDE ER 10 MEQ PO TBCR: 10.0000 meq | EXTENDED_RELEASE_TABLET | Freq: Every day | ORAL | 1 refills | Status: DC

## 2016-09-19 NOTE — Assessment & Plan Note (Signed)
New issue. Lots of stress related to being out of work and taking care of his wife. No SI or HI. We'll start on Lexapro. We'll follow up in 6 weeks. Given return precautions.

## 2016-09-19 NOTE — Assessment & Plan Note (Signed)
Found on lab work yesterday. Suspect related to combined use of HCTZ and Lasix. We will start on calcium supplementation daily when he takes Lasix. We'll recheck a BMP next Monday.

## 2016-09-19 NOTE — Patient Instructions (Signed)
Nice to see you. We will start you on Lexapro for anxiety and depression. This may take 4-8 weeks to work. We will have you start on a potassium supplement once daily when you take your Lasix. We'll recheck this in 1 week. If you develop thoughts of harming yourself or others please go to emergency room immediately.

## 2016-09-19 NOTE — Progress Notes (Signed)
  [name], MD Phone: [phone]  Shane Jones is a 71 y.o. male who presents today for follow-up.  Anxiety/depression: Patient notes issues with both of these. He has lots of stress in his life as he is taking care of his wife who has MS and he is also out of work and running out of money. He notes in the past he's been on Prozac though not in many years. 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 He notes no SI or HI.  Potassium found to be low yesterday on lab work. He does take an over-the-counter potassium supplement. He takes Lasix and HCTZ though also takes losartan. His potassium has been on the low end of normal recently.  PMH: Former smoker   ROS see history of present illness  Objective  Physical Exam Vitals:   09/19/16 1304  BP: (!) 142/88  Pulse: 65  Temp: 98.2 F (36.8 C)  SpO2: 95%    BP Readings from Last 3 Encounters:  09/19/16 (!) 142/88  09/18/16 112/76  08/14/16 123/84   Wt Readings from Last 3 Encounters:  09/19/16 189 lb 12.8 oz (86.1 kg)  09/18/16 189 lb (85.7 kg)  08/14/16 190 lb (86.2 kg)    Physical Exam  Constitutional: No distress.  HENT:  Head: Normocephalic and atraumatic.  Cardiovascular: Normal rate, regular rhythm and normal heart sounds.   Pulmonary/Chest: Effort normal and breath sounds normal.  Musculoskeletal: He exhibits no edema.  Neurological: He is alert. Gait normal.  Skin: Skin is warm and dry. He is not diaphoretic.     Assessment/Plan: Please see individual problem list.  Anxiety and depression New issue. Lots of stress related to being out of work and taking care of his wife. No SI or HI. We'll start on Lexapro. We'll follow up in 6 weeks. Given return precautions.  Hypokalemia Found on lab work yesterday. Suspect related to combined use of HCTZ and Lasix. We will start on calcium supplementation daily  when he takes Lasix. We'll recheck a BMP next Monday.   Orders Placed This Encounter  Procedures  . Basic Metabolic Panel (BMET)    Standing Status:   Future    Standing Expiration Date:   09/19/2017    Meds ordered this encounter  Medications  . escitalopram (LEXAPRO) 10 MG tablet    Sig: Take 1 tablet (10 mg total) by mouth daily.    Dispense:  30 tablet    Refill:  3  . potassium chloride (K-DUR) 10 MEQ tablet    Sig: Take 1 tablet (10 mEq total) by mouth daily. With lasix.    Dispense:  90 tablet    Refill:  1   [name], MD [place]

## 2016-09-20 ENCOUNTER — Ambulatory Visit: Payer: Medicare Other | Admitting: Family Medicine

## 2016-09-25 ENCOUNTER — Telehealth: Payer: Self-pay | Admitting: Pharmacist

## 2016-09-25 NOTE — Telephone Encounter (Signed)
Shipment still not received for Tresiba PAP. Will provide 2 weeks of samples.   Medication Samples have been provided to the patient.  Drug name: Trulicity      Strength: 0.75 mg        Qty: 2 pens  LOT: [id number]  Exp.Date: 01/2018  Dosing instructions: 1 shot subcut once weekly.  The patient has been instructed regarding the correct time, dose, and frequency of taking this medication, including desired effects and most common side effects.   Shane Jones 2:45 PM 09/25/2016

## 2016-09-26 ENCOUNTER — Other Ambulatory Visit (INDEPENDENT_AMBULATORY_CARE_PROVIDER_SITE_OTHER): Payer: Medicare Other

## 2016-09-26 DIAGNOSIS — E876 Hypokalemia: Secondary | ICD-10-CM

## 2016-09-26 LAB — BASIC METABOLIC PANEL
BUN: 35 mg/dL — ABNORMAL HIGH (ref 6–23)
CHLORIDE: 99 meq/L (ref 96–112)
CO2: 32 meq/L (ref 19–32)
CREATININE: 1.83 mg/dL — AB (ref 0.40–1.50)
Calcium: 10.3 mg/dL (ref 8.4–10.5)
GFR: 38.71 mL/min — ABNORMAL LOW (ref >60.00)
Glucose, Bld: 131 mg/dL — ABNORMAL HIGH (ref 70–99)
Potassium: 3.4 mEq/L — ABNORMAL LOW (ref 3.5–5.1)
SODIUM: 142 meq/L (ref 135–145)

## 2016-09-27 ENCOUNTER — Other Ambulatory Visit: Payer: Self-pay | Admitting: Family Medicine

## 2016-09-27 DIAGNOSIS — N179 Acute kidney failure, unspecified: Secondary | ICD-10-CM

## 2016-09-29 ENCOUNTER — Other Ambulatory Visit: Payer: Self-pay | Admitting: Family Medicine

## 2016-09-29 ENCOUNTER — Other Ambulatory Visit (INDEPENDENT_AMBULATORY_CARE_PROVIDER_SITE_OTHER): Payer: Medicare Other

## 2016-09-29 DIAGNOSIS — N179 Acute kidney failure, unspecified: Secondary | ICD-10-CM

## 2016-09-29 LAB — BASIC METABOLIC PANEL
BUN: 28 mg/dL — ABNORMAL HIGH (ref 6–23)
CHLORIDE: 100 meq/L (ref 96–112)
CO2: 33 mEq/L — ABNORMAL HIGH (ref 19–32)
Calcium: 10.5 mg/dL (ref 8.4–10.5)
Creatinine, Ser: 1.75 mg/dL — ABNORMAL HIGH (ref 0.40–1.50)
GFR: 40.75 mL/min — ABNORMAL LOW (ref >60.00)
Glucose, Bld: 188 mg/dL — ABNORMAL HIGH (ref 70–99)
POTASSIUM: 3.8 meq/L (ref 3.5–5.1)
SODIUM: 142 meq/L (ref 135–145)

## 2016-10-04 NOTE — Telephone Encounter (Signed)
Trulicity shipments received to doctor's office, patient notified that they are there waiting for him.   Patient approved through [employer] for [employer] 25 mg daily effective 10/02/2016-01/01/2017. Will be shipped to his home in the next 5-10 business days. Patient notified.   Carlean Jews, Pharm.D. PGY2 Ambulatory Care Pharmacy Resident Phone: [phone]

## 2016-10-06 ENCOUNTER — Other Ambulatory Visit (INDEPENDENT_AMBULATORY_CARE_PROVIDER_SITE_OTHER): Payer: Medicare Other

## 2016-10-06 ENCOUNTER — Telehealth: Payer: Self-pay | Admitting: Family Medicine

## 2016-10-06 DIAGNOSIS — E876 Hypokalemia: Secondary | ICD-10-CM

## 2016-10-06 DIAGNOSIS — N179 Acute kidney failure, unspecified: Secondary | ICD-10-CM | POA: Diagnosis not present

## 2016-10-06 LAB — BASIC METABOLIC PANEL
BUN: 22 mg/dL (ref 6–23)
CALCIUM: 10 mg/dL (ref 8.4–10.5)
CO2: 34 meq/L — AB (ref 19–32)
Chloride: 99 mEq/L (ref 96–112)
Creatinine, Ser: 1.49 mg/dL (ref 0.40–1.50)
GFR: 49.06 mL/min — ABNORMAL LOW (ref >60.00)
GLUCOSE: 231 mg/dL — AB (ref 70–99)
Potassium: 3.2 mEq/L — ABNORMAL LOW (ref 3.5–5.1)
SODIUM: 142 meq/L (ref 135–145)

## 2016-10-06 NOTE — Telephone Encounter (Signed)
Attempted to contact patient on both of his numbers. There is no answer. Will attempt to call later this evening.

## 2016-10-06 NOTE — Telephone Encounter (Signed)
Attempted to contact the patient again. No answer. I left a message advising we would contact him tomorrow and also asking him to give [name] a call back tomorrow or on Monday if he desired. If he does happen to call back please advise that his potassium is slightly low and he should increase his potassium chloride supplement to 20 mEq daily with plan to recheck next week.

## 2016-10-07 NOTE — Telephone Encounter (Signed)
Attempted to contact the patient again. There is no answer. I left a message asking him to call us back on Monday. I'll have Janett Billow try to contact him on Monday as well.

## 2016-10-09 NOTE — Telephone Encounter (Signed)
Called to discuss KCl Rx and samples waiting for him at MD office. No answer. Left HIPAA-compliant VM asking patient to return my phone call.

## 2016-10-09 NOTE — Telephone Encounter (Signed)
Patient picked up medication

## 2016-10-11 NOTE — Telephone Encounter (Signed)
Left message to return call 

## 2016-10-16 ENCOUNTER — Other Ambulatory Visit (INDEPENDENT_AMBULATORY_CARE_PROVIDER_SITE_OTHER): Payer: Medicare Other

## 2016-10-16 ENCOUNTER — Other Ambulatory Visit: Payer: Self-pay | Admitting: Radiology

## 2016-10-16 DIAGNOSIS — E876 Hypokalemia: Secondary | ICD-10-CM

## 2016-10-16 LAB — POTASSIUM: POTASSIUM: 3.3 meq/L — AB (ref 3.5–5.1)

## 2016-10-16 NOTE — Telephone Encounter (Signed)
Patient notified and has been taking the potassium, patient is coming in for lab today. Order placed

## 2016-10-16 NOTE — Addendum Note (Signed)
Addended by: Juanda Chance on: 10/16/2016 09:40 AM   Modules accepted: Orders

## 2016-10-25 ENCOUNTER — Other Ambulatory Visit: Payer: Self-pay | Admitting: Family Medicine

## 2016-10-25 DIAGNOSIS — E876 Hypokalemia: Secondary | ICD-10-CM

## 2016-10-30 ENCOUNTER — Encounter (INDEPENDENT_AMBULATORY_CARE_PROVIDER_SITE_OTHER): Payer: Self-pay

## 2016-10-30 ENCOUNTER — Ambulatory Visit (INDEPENDENT_AMBULATORY_CARE_PROVIDER_SITE_OTHER): Payer: Medicare Other | Admitting: Pharmacist

## 2016-10-30 ENCOUNTER — Encounter: Payer: Self-pay | Admitting: Pharmacist

## 2016-10-30 VITALS — BP 136/89 | HR 68 | Wt 189.0 lb

## 2016-10-30 DIAGNOSIS — E1140 Type 2 diabetes mellitus with diabetic neuropathy, unspecified: Secondary | ICD-10-CM

## 2016-10-30 DIAGNOSIS — E876 Hypokalemia: Secondary | ICD-10-CM | POA: Diagnosis not present

## 2016-10-30 DIAGNOSIS — E1165 Type 2 diabetes mellitus with hyperglycemia: Secondary | ICD-10-CM

## 2016-10-30 DIAGNOSIS — Z23 Encounter for immunization: Secondary | ICD-10-CM | POA: Diagnosis not present

## 2016-10-30 DIAGNOSIS — IMO0002 Reserved for concepts with insufficient information to code with codable children: Secondary | ICD-10-CM

## 2016-10-30 LAB — BASIC METABOLIC PANEL
BUN: 13 mg/dL (ref 6–23)
CHLORIDE: 104 meq/L (ref 96–112)
CO2: 31 meq/L (ref 19–32)
Calcium: 9.5 mg/dL (ref 8.4–10.5)
Creatinine, Ser: 1.21 mg/dL (ref 0.40–1.50)
GFR: 62.37 mL/min (ref >60.00)
GLUCOSE: 250 mg/dL — AB (ref 70–99)
Potassium: 3.8 mEq/L (ref 3.5–5.1)
SODIUM: 143 meq/L (ref 135–145)

## 2016-10-30 NOTE — Patient Instructions (Addendum)
Good to see you today!   1. No changes to your medications   2. Take your blood sugars and write them down/bring in your meter  3. See me in about 1 month.   Labs today, we will call you with any issues.

## 2016-10-30 NOTE — Assessment & Plan Note (Signed)
#  Diabetes longstanding currently with improved control per CBGs reported by patient, however hard to assess without objective CBGs. Patient denies hypoglycemic events and is able to verbalize appropriate hypoglycemia management plan. Patient reports adherence with medication. Following discussion and approval by Dr. Caryl Bis, the following medication changes were made:  - No changes to medications, will continue glipizide for now until we know CBGs objectively. Planning to d/c glipizide at next visit as long as CBGs are controlled. - Asked him to check CBGs more carefully while driving and bring log into next visit. - Counseled on sick day rules for Jardiance. - Next A1C anticipated 11/07/2016 or later.    #ASCVD risk - greater than 7.5%. - Continued Aspirin 81 mg  - Continued atorvastatin 40 mg.

## 2016-10-30 NOTE — Progress Notes (Signed)
S:     Chief Complaint  Patient presents with  . Medication Management    Diabetes   Patient arrives in good spirits, ambulating without assistance.  Presents for diabetes evaluation, education, and management at the request of Dr. Caryl Bis. Patient was referred on 07/31/2016. Patient was last seen by Primary Care Provider on 09/19/2016. Seeing patient as follow up to Rx clinic visit on 09/18/2016. At that time, [name] was increased to 20 mg daily (waiting for 25 mg tablets to be provided via manufacturer assistance program (MAP) shipment), glipizide was restarted while waiting for MAP, and Trulicity was continued at 0.75 mg weekly. Samples have been provided to patient intermittently.   Patient now has supply of Jardiance 25 mg daily and Trulicity 2.97 mg weekly. Reports that his eye sight has significantly improved since CBGs controlled. 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  Reports his mother in law was buried yesterday so sugars were running higher after eating the finger foods at the wake. Denies flank pain, denies pain/burning on urination.  Forgot to take medications today.   Family/Social History: Wife with MS, got back to work after passing physical.   Patient reports adherence with medications now that they have been mailed to him. Current diabetes medications include: Metformin 1000 mg BID, glipizide 5 mg BID, Trulicity 9.89 mg weekly, Jardiance 25 mg daily.  Current hypertension medications include: amlodipine 10 mg daily, HCTZ 25 mg daily, furosemide 20 mg daily, losartan 100 mg daily, metoprolol succinate 100 mg daily  Patient denies hypoglycemic events.  Patient reported dietary habits: Eats 2 meals/day  Breakfast: boiled eggs Lunch: skips usually Dinner: hamburger Snacks: rarely snacks - cheese crackers Drinks: water  Patient reported exercise habits: has stopped going back to the gym since working again     Patient denies nocturia.  Patient reports neuropathy. Improved some days.  Patient reports visual changes - much improved per patient Patient reports self foot exams.  O:  Physical Exam  Constitutional: He appears well-developed and well-nourished.  Vitals reviewed.    Review of Systems  All other systems reviewed and are negative.    Lab Results  Component Value Date   HGBA1C 8.5 (H) 08/07/2016   Vitals:   10/30/16 1304 10/30/16 1326  BP: (!) 142/88 136/89  Pulse: 67 68    Home fasting CBG: hasn't been checking 2 hour post-prandial/random CBG: 90s-110s, some high in the low 200s after dietary indiscretion.   A/P: #Diabetes longstanding currently with improved control per CBGs reported by patient, however hard to assess without objective CBGs. Patient denies hypoglycemic events and is able to verbalize appropriate hypoglycemia management plan. Patient reports adherence with medication. Following discussion and approval by Dr. Caryl Bis, the following medication changes were made:  - No changes to medications, will continue glipizide for now until we know CBGs objectively. Planning to d/c glipizide at next visit as long as CBGs are controlled. - Asked him to check CBGs more carefully while driving and bring log into next visit. - Counseled on sick day rules for Jardiance. - Next A1C anticipated 11/07/2016 or later.    #ASCVD risk - greater than 7.5%. - Continued Aspirin 81 mg  - Continued atorvastatin 40 mg.   #Hypertension longstanding diagnosed currently above goal of <130/80 however, this in the setting of medication noncompliance today which is unusual per patient - No change to medications at this time.  - Will f/u with patient at next visit  Patient was seen with Dr. Caryl Bis today in clinic and medication changes were discussed and approved prior to initiation.Written patient instructions provided.  Total time in face to face counseling 45 minutes.    Follow  up in Pharmacist Clinic Visit ~1 month.  Carlean Jews, Pharm.D. PGY2 Ambulatory Care Pharmacy Resident Phone: [phone]

## 2016-10-30 NOTE — Assessment & Plan Note (Signed)
#  Hypertension longstanding diagnosed currently above goal of <130/80 however, this in the setting of medication noncompliance today which is unusual per patient - No change to medications at this time.  - Will f/u with patient at next visit

## 2016-10-30 NOTE — Progress Notes (Signed)
I have reviewed the above note and agree. I saw the patient with the pharmacist.  Oaklie Durrett, M.D.  

## 2016-11-01 ENCOUNTER — Ambulatory Visit: Payer: Medicare Other | Admitting: Family Medicine

## 2016-11-27 ENCOUNTER — Encounter: Payer: Self-pay | Admitting: Pharmacist

## 2016-11-27 ENCOUNTER — Ambulatory Visit (INDEPENDENT_AMBULATORY_CARE_PROVIDER_SITE_OTHER): Payer: Medicare Other | Admitting: Pharmacist

## 2016-11-27 VITALS — BP 136/82 | HR 65 | Ht 65.0 in | Wt 183.0 lb

## 2016-11-27 DIAGNOSIS — IMO0002 Reserved for concepts with insufficient information to code with codable children: Secondary | ICD-10-CM

## 2016-11-27 DIAGNOSIS — E1165 Type 2 diabetes mellitus with hyperglycemia: Secondary | ICD-10-CM

## 2016-11-27 DIAGNOSIS — E1140 Type 2 diabetes mellitus with diabetic neuropathy, unspecified: Secondary | ICD-10-CM

## 2016-11-27 LAB — POCT GLYCOSYLATED HEMOGLOBIN (HGB A1C): HEMOGLOBIN A1C: 6.6

## 2016-11-27 MED ORDER — GLUCOSE BLOOD VI STRP: ORAL_STRIP | 12 refills | Status: DC

## 2016-11-27 NOTE — Patient Instructions (Signed)
Thanks for coming to see me! You have done SUCH A GREAT JOB!!!!   1. Stop the glipizide 2. Increase your metformin back to 1000 mg (one whole pill) twice a day  I will call the drug company and order a refill of your Trulicity.   Come back to see me in ~1 month.

## 2016-11-27 NOTE — Progress Notes (Signed)
  I have reviewed the above information and agree with above.   Jett Kulzer, MD 

## 2016-11-27 NOTE — Assessment & Plan Note (Signed)
#  Hypertension longstanding diagnosed currently above goal of <130/80 however, very close to goal and patient reports controlled readings at home -No change to medications at this time.  -Will f/u with patient at next visit

## 2016-11-27 NOTE — Assessment & Plan Note (Signed)
#  Diabetes longstanding currently with greatly improved control, now below goal of A1C <7%. Patient denies hypoglycemic events and is able to verbalize appropriate hypoglycemia management plan. Patient reports adherence with medication but did misunderstand metformin directions. Renal function can tolerate increase in metformin. Following discussion and approval by Dr. Derrel Nip, the following medication changes were made:  -A1C today -D/c glipizide -Increase metformin back to 1000 mg BID -Continue other medications, requested refill for Trulicity 8.08 mg once weekly from [place] on sick day rules for [place]. -Next A1C anticipated 02/28/2016 or later  #ASCVD risk - greater than 7.5%. -Continued Aspirin 81 mg  -Continued atorvastatin 40 mg.

## 2016-11-27 NOTE — Progress Notes (Signed)
S:     Chief Complaint  Patient presents with  . Medication Management    Diabetes    Patient arrives in good spirits, ambulating without assistance.  Presents for diabetes evaluation, education, and management at the request of Dr. Caryl Bis. Patient was referred on 07/31/2016.  Patient was last seen by Primary Care Provider on 09/19/2016. Last Rx Clinic visit on 10/30/2016 - at that time no changes were made to medications, but we were planning to d/c glipizide once objective data known about glycemic control and pt was instructed to bring in log to next visit.  Today patient reports "I feel better than ever!" He is excited that he got a new job that pays even more and is more local. Patient brings in CBG meter for review. Reports that he misunderstood and has been taking metformin 1/2 tablet (500 mg) BID. Reports that his exercise has decreased with new job but he has continued to watch what he eats.   Insurance coverage/medication affordability: Approved for Jardiance and Trulicity via patient assistance foundations.  Patient reports adherence with medications.  Current diabetes medications include: metformin 500 mg BID (prescribed 1000 mg BID, patient misunderstood directions), Trulicity 8.54 mg weekly, Jardiance 25 mg daily, glipizide IR 5 mg BID Current hypertension medications include: amlodipine 10 mg daily, losartan 100 mg daily, furosemide 20 mg daily, metoprolol succinate 100 mg daily.  Patient denies hypoglycemic events.  O:  Physical Exam  Constitutional: He appears well-developed and well-nourished.     Review of Systems  Genitourinary: Negative for dysuria, frequency and urgency.  All other systems reviewed and are negative.   BMET    Component Value Date/Time   NA 143 10/30/2016 1410   K 3.8 10/30/2016 1410   CL 104 10/30/2016 1410   CO2 31 10/30/2016 1410   GLUCOSE 250 (H) 10/30/2016 1410   BUN 13 10/30/2016 1410   CREATININE 1.21 10/30/2016 1410   CREATININE 1.45 (H) 08/07/2016 1156   CALCIUM 9.5 10/30/2016 1410  GFR = 62.37 ml/min as of 10/30/2016 labs  Lab Results  Component Value Date   HGBA1C 6.6 11/27/2016   Vitals:   11/27/16 1156 11/27/16 1201  BP: (!) 155/96 136/82  Pulse: 65 65   Random CBGs - all in 100s-160s, few excursions to 80s-100s, 190s-210s Home BP readings per patient report - 120s-130s/70s  A/P: #Diabetes longstanding currently with greatly improved control, now below goal of A1C <7%. Patient denies hypoglycemic events and is able to verbalize appropriate hypoglycemia management plan. Patient reports adherence with medication but did misunderstand metformin directions. Renal function can tolerate increase in metformin. Following discussion and approval by Dr. Derrel Nip, the following medication changes were made:  -A1C today -D/c glipizide -Increase metformin back to 1000 mg BID -Continue other medications, requested refill for Trulicity 6.27 mg once weekly from [place] [facility] on sick day rules for [place]. -Next A1C anticipated 02/28/2016 or later  #ASCVD risk - greater than 7.5%. -Continued Aspirin 81 mg  -Continued atorvastatin 40 mg.   #Hypertension longstanding diagnosed currently above goal of <130/80 however, very close to goal and patient reports controlled readings at home -No change to medications at this time.  -Will f/u with patient at next visit  Patient was seen with Dr. Derrel Nip today in clinic and medication changes were discussed and approved prior to initiation.Written patient instructions provided.  Total time in face to face counseling 20 minutes.    Follow up in Pharmacist Clinic Visit ~1 month.  Shane Jones  E Anallely Rosell, Pharm.D., BCPS PGY2 Ambulatory Care Pharmacy Resident Phone: [phone]

## 2016-12-15 ENCOUNTER — Other Ambulatory Visit: Payer: Self-pay | Admitting: *Deleted

## 2016-12-15 ENCOUNTER — Telehealth: Payer: Self-pay | Admitting: Family Medicine

## 2016-12-15 MED ORDER — METFORMIN HCL 1000 MG PO TABS: 1000.0000 mg | ORAL_TABLET | Freq: Two times a day (BID) | ORAL | 3 refills | Status: DC

## 2016-12-15 NOTE — Telephone Encounter (Signed)
Copied from [place]. Topic: Quick Communication - Rx Refill/Question >> Dec 15, 2016 10:10 AM Robina Ade, Helene Kelp D wrote: Has the patient contacted their pharmacy? Yes (Agent: If no, request that the patient contact the pharmacy for the refill.) Preferred Pharmacy (with phone number or street name): [id number] - [place], [place] Agent: Please be advised that RX refills may take up to 3 business days. We ask that you follow-up with your pharmacy. Patient needs refill on his metFORMIN (GLUCOPHAGE) 1000 MG tablet for 2X a day. For this occasion he needs it to be sent to [facility] and from then on to [facility] mail order please.

## 2016-12-25 ENCOUNTER — Ambulatory Visit: Payer: Medicare Other | Admitting: Pharmacist

## 2017-01-07 NOTE — Progress Notes (Addendum)
S:     Chief Complaint  Patient presents with  . Medication Management    diabetes    Patient arrives late but in good spirits.  Presents for diabetes evaluation, education, and management at the request of Dr. Caryl Bis (referred on 07/31/2016). Last seen by primary care provider on 09/19/2016. Last Rx Clinic visit on 11/27/2016 - at that time A1C was found to be improved, glipizide was d/c'd and metformin was increased.   Today, patient reports good control of diabetes and adherence to diabetic medications. Patient denies any hypoglycemic events after discontinuation of glipizide. 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   Patient inquires about gabapentin refill.  Insurance coverage/medication affordability: Jardiance and Trulicity - states affordable at this time (Patient assistance applications will need to be resubmitted)  Patient reports adherence with medications.  Current diabetes medications include: Metformin 263 mg BID, Trulicity 7.85 mg weekly, Jardiance 25 mg daily.  Current hypertension medications include: amlodipine 10 mg daily, losartan 100 mg daily, furosemide 20 mg daily, metoprolol succinate 100 mg daily.  Patient denies hypoglycemic events.  Patient reported dietary habits: Eats 2-3 meals/day Breakfast:skips Lunch:sausage, eggs, Chocolate pie Dinner: burrito Drinks:water  Patient reported exercise habits: [employer] 0-1x/weekly, walking on breaks from driving   Patient reports nocturia. 2-3 times nightly "I don't care, it doesn't bother me" Patient denies pain/burning on urination.  Patient denies neuropathy. Reports history of Carpal-tunnel syndome and reports residual burning along forearms Patient denies visual changes. Patient reports self foot exams.   O:  Physical Exam  Constitutional: He appears well-developed and well-nourished.     Review of Systems  Genitourinary: Negative for dysuria,  frequency and urgency.  All other systems reviewed and are negative.    Lab Results  Component Value Date   HGBA1C 6.6 11/27/2016   Vitals:   01/08/17 0830  BP: 130/89  Pulse: 94    Lipid Panel     Component Value Date/Time   CHOL 117 08/07/2016 1156   TRIG 316 (H) 08/07/2016 1156   HDL 31 (L) 08/07/2016 1156   CHOLHDL 3.8 08/07/2016 1156   VLDL 63 (H) 08/07/2016 1156   LDLCALC 23 08/07/2016 1156   LDLDIRECT 48.0 11/01/2015 0815    Home fasting CBG: 90s-100 per patient memory  2 hour post-prandial/random CBG: <120s  Takes CBGs "when I feel like it" Highest = 207 over the holidays  Clinical ASCVD: No ; High-risk conditions: Age 54 or older, DM, HTN, ASCVD risk factors: no others  A/P: #Diabetes longstanding currently controlled. Patient denies hypoglycemic events and is able to verbalize appropriate hypoglycemia management plan. Patient reports adherence with medication. - Continue all medications (metformin 8850 mg BID, Trulicity 2.77 mg once weekly, Jardiance 25 mg daily)  - Counseled on sick day rules for Jardiance, started new application for patient assistance application - Next A1O anticipated 02/27/17 or later  - [facility] pharmacy to f/u gabapentin refill - they state that the refill processed 01/06/2016  #ASCVD risk - primary prevention in patient aged 101-75 with DM, baseline LDL 70-189, multiple risk factors - high intensity statin indicated - Continued Aspirin 81 mg  - Continued atorvastatin 40 mg.   #Hypertension longstanding currently controlled.  Patient reports adherence with medication.  -Continue current medications  Written patient instructions provided.  Total time in face to face counseling 25 minutes.    Follow up in Pharmacist Clinic Visit in 1 month.   Patient seen  with Felicity Pellegrini, PharmD Candidate  Carlean Jews, Pharm.D., BCPS, CPP PGY2 Ambulatory Care Pharmacy Resident Phone: [phone]

## 2017-01-08 ENCOUNTER — Ambulatory Visit (INDEPENDENT_AMBULATORY_CARE_PROVIDER_SITE_OTHER): Payer: Medicare Other | Admitting: Pharmacist

## 2017-01-08 ENCOUNTER — Encounter: Payer: Self-pay | Admitting: Pharmacist

## 2017-01-08 VITALS — BP 130/89 | HR 94 | Ht 66.0 in | Wt 186.0 lb

## 2017-01-08 DIAGNOSIS — IMO0002 Reserved for concepts with insufficient information to code with codable children: Secondary | ICD-10-CM

## 2017-01-08 DIAGNOSIS — I10 Essential (primary) hypertension: Secondary | ICD-10-CM

## 2017-01-08 DIAGNOSIS — E1165 Type 2 diabetes mellitus with hyperglycemia: Secondary | ICD-10-CM

## 2017-01-08 DIAGNOSIS — E1140 Type 2 diabetes mellitus with diabetic neuropathy, unspecified: Secondary | ICD-10-CM

## 2017-01-08 MED ORDER — EMPAGLIFLOZIN 25 MG PO TABS: 25.0000 mg | ORAL_TABLET | Freq: Every day | ORAL | 3 refills | Status: DC

## 2017-01-08 NOTE — Assessment & Plan Note (Signed)
#  Diabetes longstanding currently controlled. Patient denies hypoglycemic events and is able to verbalize appropriate hypoglycemia management plan. Patient reports adherence with medication. - Continue all medications (metformin 7673 mg BID, Trulicity 4.19 mg once weekly, Jardiance 25 mg daily)  - Counseled on sick day rules for Jardiance - Next A1C anticipated 02/27/17 or later   #ASCVD risk - primary prevention in patient aged 74-75 with DM, baseline LDL 70-189, multiple risk factors - high intensity statin indicated - Continued Aspirin 81 mg  - Continued atorvastatin 40 mg.

## 2017-01-08 NOTE — Patient Instructions (Addendum)
Thanks for coming to see me!   You're doing fabulously!!!! Continue all of your medications as is. We will call [employer] and see what's going on with your gabapentin.   You do not need to follow up with me anymore but if you would like you can come in ~ 1 month after you see Dr. Caryl Bis just to check up, that's fine.

## 2017-01-08 NOTE — Assessment & Plan Note (Signed)
#  Hypertension longstanding currently controlled.  Patient reports adherence with medication.  -Continue current medications

## 2017-01-08 NOTE — Progress Notes (Signed)
I have reviewed the above note and agree. I was available to the pharmacist for consultation.  Taji Barretto, MD  

## 2017-01-08 NOTE — Addendum Note (Signed)
Addended by: Deirdre Pippins E on: 01/08/2017 01:50 PM   Modules accepted: Orders

## 2017-01-22 ENCOUNTER — Ambulatory Visit: Payer: Medicare Other | Admitting: Family Medicine

## 2017-03-13 ENCOUNTER — Other Ambulatory Visit: Payer: Self-pay | Admitting: Family Medicine

## 2017-03-19 ENCOUNTER — Other Ambulatory Visit: Payer: Self-pay

## 2017-03-19 ENCOUNTER — Ambulatory Visit (INDEPENDENT_AMBULATORY_CARE_PROVIDER_SITE_OTHER): Payer: Medicare Other | Admitting: Pharmacist

## 2017-03-19 ENCOUNTER — Encounter: Payer: Self-pay | Admitting: Family Medicine

## 2017-03-19 ENCOUNTER — Ambulatory Visit (INDEPENDENT_AMBULATORY_CARE_PROVIDER_SITE_OTHER): Payer: Medicare Other | Admitting: Family Medicine

## 2017-03-19 VITALS — BP 144/92 | HR 70 | Temp 97.7°F | Wt 184.2 lb

## 2017-03-19 DIAGNOSIS — E1140 Type 2 diabetes mellitus with diabetic neuropathy, unspecified: Secondary | ICD-10-CM

## 2017-03-19 DIAGNOSIS — IMO0002 Reserved for concepts with insufficient information to code with codable children: Secondary | ICD-10-CM

## 2017-03-19 DIAGNOSIS — E785 Hyperlipidemia, unspecified: Secondary | ICD-10-CM

## 2017-03-19 DIAGNOSIS — Z125 Encounter for screening for malignant neoplasm of prostate: Secondary | ICD-10-CM | POA: Diagnosis not present

## 2017-03-19 DIAGNOSIS — E1165 Type 2 diabetes mellitus with hyperglycemia: Secondary | ICD-10-CM

## 2017-03-19 DIAGNOSIS — B351 Tinea unguium: Secondary | ICD-10-CM | POA: Diagnosis not present

## 2017-03-19 DIAGNOSIS — I10 Essential (primary) hypertension: Secondary | ICD-10-CM | POA: Diagnosis not present

## 2017-03-19 LAB — COMPREHENSIVE METABOLIC PANEL
ALK PHOS: 66 U/L (ref 39–117)
ALT: 16 U/L (ref 0–53)
AST: 19 U/L (ref 0–37)
Albumin: 4.5 g/dL (ref 3.5–5.2)
BILIRUBIN TOTAL: 0.8 mg/dL (ref 0.2–1.2)
BUN: 25 mg/dL — ABNORMAL HIGH (ref 6–23)
CALCIUM: 10.4 mg/dL (ref 8.4–10.5)
CO2: 31 meq/L (ref 19–32)
CREATININE: 1.44 mg/dL (ref 0.40–1.50)
Chloride: 102 mEq/L (ref 96–112)
GFR: 50.97 mL/min — ABNORMAL LOW (ref >60.00)
GLUCOSE: 146 mg/dL — AB (ref 70–99)
Potassium: 4.3 mEq/L (ref 3.5–5.1)
Sodium: 145 mEq/L (ref 135–145)
TOTAL PROTEIN: 7.4 g/dL (ref 6.0–8.3)

## 2017-03-19 LAB — LDL CHOLESTEROL, DIRECT: LDL DIRECT: 51 mg/dL

## 2017-03-19 LAB — HEMOGLOBIN A1C: Hgb A1c MFr Bld: 8 % — ABNORMAL HIGH (ref 4.6–6.5)

## 2017-03-19 LAB — PSA, MEDICARE: PSA: 0.69 ng/ml (ref 0.10–4.00)

## 2017-03-19 MED ORDER — EMPAGLIFLOZIN 25 MG PO TABS: 25.0000 mg | ORAL_TABLET | Freq: Every day | ORAL | 3 refills | Status: DC

## 2017-03-19 NOTE — Assessment & Plan Note (Signed)
Refer to podiatry to trim and evaluate nails.

## 2017-03-19 NOTE — Assessment & Plan Note (Signed)
Check LDL. Continue lipitor.  

## 2017-03-19 NOTE — Progress Notes (Signed)
  [name], MD Phone: [phone]  Shane Jones is a 72 y.o. male who presents today for f/u.  HYPERTENSION Disease Monitoring: Blood pressure range-ok at home Chest pain- no      Dyspnea- no Medications: Compliance- taking amlodipine, losartan, metoprolol    Edema- no  DIABETES Disease Monitoring: Blood Sugar ranges-<120 Polyuria/phagia/dipsia- no     Optho- due Medications: Compliance- taking trulicity, jardiance, metformin Hypoglycemic symptoms- no  HYPERLIPIDEMIA Disease Monitoring: See symptoms for Hypertension Medications: Compliance- taking lipitor Right upper quadrant pain- no  Muscle aches- no  Chronic thickened toenails bilaterally. Unable to cut them on his own.  Social History   Tobacco Use  Smoking Status Former Smoker  . Last attempt to quit: 11/20/1978  . Years since quitting: 38.3  Smokeless Tobacco Never Used     ROS see history of present illness  Objective  Physical Exam Vitals:   03/19/17 0821  BP: (!) 144/92  Pulse: 70  Temp: 97.7 F (36.5 C)  SpO2: 96%    BP Readings from Last 3 Encounters:  03/19/17 (!) 144/92  01/08/17 130/89  11/27/16 136/82   Wt Readings from Last 3 Encounters:  03/19/17 184 lb 3.2 oz (83.6 kg)  01/08/17 186 lb (84.4 kg)  11/27/16 183 lb (83 kg)    Physical Exam  Constitutional: No distress.  Cardiovascular: Normal rate, regular rhythm and normal heart sounds.  Pulmonary/Chest: Effort normal and breath sounds normal.  Musculoskeletal: He exhibits no edema.  Neurological: He is alert. Gait normal.  Skin: Skin is warm and dry. He is not diaphoretic.   Diabetic Foot Exam - Simple   Simple Foot Form Diabetic Foot exam was performed with the following findings:  Yes 03/19/2017  8:46 AM  Visual Inspection See comments:  Yes Sensation Testing Intact to touch and monofilament testing bilaterally:  Yes Pulse Check Posterior Tibialis and Dorsalis pulse intact bilaterally:  Yes Comments Thickened  toenails bilaterally throughout with darkening of right first toenail      Assessment/Plan: Please see individual problem list.  Type 2 diabetes, uncontrolled, with neuropathy (HCC) Improved control. Check A1c. Refer to optho for exam.  Hyperlipidemia Check LDL. Continue lipitor.  Prostate cancer screening Discussed prostate cancer screening. Plan to check PSA.   Hypertension Well controlled at home for age. Slightly above goal here. He will contact us if consistently greater than 140/90. Continue current regimen.   Onychomycosis Refer to podiatry to trim and evaluate nails.    Orders Placed This Encounter  Procedures  . Comp Met (CMET)  . HgB A1c  . Direct LDL  . PSA, Medicare  . Ambulatory referral to Podiatry    Referral Priority:   Routine    Referral Type:   Consultation    Referral Reason:   Specialty Services Required    Requested Specialty:   Podiatry    Number of Visits Requested:   1  . Ambulatory referral to Ophthalmology    Referral Priority:   Routine    Referral Type:   Consultation    Referral Reason:   Specialty Services Required    Requested Specialty:   Ophthalmology    Number of Visits Requested:   1    No orders of the defined types were placed in this encounter.    [name], MD [place]

## 2017-03-19 NOTE — Assessment & Plan Note (Signed)
Discussed prostate cancer screening. Plan to check PSA.

## 2017-03-19 NOTE — Progress Notes (Signed)
   Saw patient today briefly to discuss medication assistance. Filled out application for Jardiance, does not qualify for Trulicity yet as he has not met out of pocket spend requirement for the year. States he has a large supply of Trulicity at present. Reports some nonadherence to medications and higher fasting glucoses. Has not been checking regularly. Denies s/sx UTI/yeast infection or n/v. Seeing Dr. Caryl Bis today.   Carlean Jews, Pharm.D., BCPS PGY2 Ambulatory Care Pharmacy Resident Phone: [phone]

## 2017-03-19 NOTE — Assessment & Plan Note (Signed)
Well controlled at home for age. Slightly above goal here. He will contact us if consistently greater than 140/90. Continue current regimen.

## 2017-03-19 NOTE — Patient Instructions (Signed)
Nice to see you. We will check labs today and contact you with the results.  We will refer you to the eye doctor and foot doctor.

## 2017-03-19 NOTE — Assessment & Plan Note (Signed)
Improved control. Check A1c. Refer to optho for exam.

## 2017-03-20 ENCOUNTER — Encounter: Payer: Self-pay | Admitting: Family Medicine

## 2017-03-21 ENCOUNTER — Telehealth: Payer: Self-pay

## 2017-03-21 NOTE — Progress Notes (Signed)
Patient seen in the office the same day.  [name], MD

## 2017-03-21 NOTE — Telephone Encounter (Signed)
Patient notified of lab results and verbalized understanding. Copied from [place]. Topic: Quick Communication - Lab Results >> Mar 21, 2017 10:43 AM Scherrie Gerlach wrote: Pt would like to know the results of his A1C asap.  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 pt is upset he has not heard anything. Advised him I would sent high priority.

## 2017-04-16 DIAGNOSIS — E119 Type 2 diabetes mellitus without complications: Secondary | ICD-10-CM | POA: Diagnosis not present

## 2017-04-16 LAB — HM DIABETES EYE EXAM

## 2017-04-17 ENCOUNTER — Encounter: Payer: Self-pay | Admitting: Family Medicine

## 2017-04-18 ENCOUNTER — Other Ambulatory Visit: Payer: Self-pay | Admitting: Family Medicine

## 2017-04-20 ENCOUNTER — Other Ambulatory Visit: Payer: Self-pay | Admitting: Family Medicine

## 2017-04-23 ENCOUNTER — Other Ambulatory Visit: Payer: Self-pay

## 2017-04-23 ENCOUNTER — Telehealth: Payer: Self-pay

## 2017-04-23 MED ORDER — GLUCOSE BLOOD VI STRP: ORAL_STRIP | 3 refills | Status: DC

## 2017-04-23 NOTE — Telephone Encounter (Signed)
Copied from [place] [id number]. Topic: Inquiry >> Apr 23, 2017  2:25 PM Conception Chancy, NT wrote: Reason for CRM: patient is calling and states he would like Hoyle Sauer to give him a call back. Would not disclose any information with me, stated she was the only one that could do it. Please advise

## 2017-04-23 NOTE — Telephone Encounter (Signed)
Last OV 03/19/17 last filled by Dr.Cook 07/14/16 90 0rf

## 2017-04-24 NOTE — Telephone Encounter (Signed)
Called patient, no answer, left HIPAA-compliant VM requesting he return my call.   Carlean Jews, Pharm.D., BCPS PGY2 Ambulatory Care Pharmacy Resident Phone: [phone]

## 2017-06-25 ENCOUNTER — Encounter: Payer: Self-pay | Admitting: Pharmacist

## 2017-06-25 ENCOUNTER — Ambulatory Visit (INDEPENDENT_AMBULATORY_CARE_PROVIDER_SITE_OTHER): Payer: Medicare Other | Admitting: Pharmacist

## 2017-06-25 VITALS — BP 144/99 | HR 65 | Ht 64.0 in | Wt 184.0 lb

## 2017-06-25 DIAGNOSIS — E785 Hyperlipidemia, unspecified: Secondary | ICD-10-CM

## 2017-06-25 DIAGNOSIS — E1165 Type 2 diabetes mellitus with hyperglycemia: Secondary | ICD-10-CM

## 2017-06-25 DIAGNOSIS — E1140 Type 2 diabetes mellitus with diabetic neuropathy, unspecified: Secondary | ICD-10-CM

## 2017-06-25 DIAGNOSIS — IMO0002 Reserved for concepts with insufficient information to code with codable children: Secondary | ICD-10-CM

## 2017-06-25 LAB — POCT GLYCOSYLATED HEMOGLOBIN (HGB A1C): HEMOGLOBIN A1C: 8 % — AB (ref 4.0–5.6)

## 2017-06-25 MED ORDER — EMPAGLIFLOZIN 25 MG PO TABS: 25.0000 mg | ORAL_TABLET | Freq: Every day | ORAL | 3 refills | Status: DC

## 2017-06-25 NOTE — Assessment & Plan Note (Signed)
  Hypertension longstanding currently uncontrolled.  BP goal = <130/80 mmHg. Patient is not adherent with medication. Control is suboptimal due to medication timing. - No changes to meds, counseled on dietary interventions for control of BP.  -F/u at future visit

## 2017-06-25 NOTE — Patient Instructions (Addendum)
Good to see you! Your A1C was still too high at 8.0%. I think that's because you have not been taking your medicines as often as you used to be. We really need to get you back on them every day.   I will send a prescription to [facility] for the Jardiance, This should be shipped to your house.   I need you to call your insurance company and ask them to mail your " Explanation of benefits " to you. Catie (the new pharmacist) will need this to apply for Trulicity.   Schedule an appointment with her after 7/29

## 2017-06-25 NOTE — Assessment & Plan Note (Signed)
Diabetes longstanding currently uncontrolled as evidenced by A1C unchanged from previous. H/o control on these medications however his control is now limited by medication noncompliance. Patient is able to verbalize appropriate hypoglycemia management plan.  -A1C today -Continue current medications. Discussed methods to increase compliance including splitting metformin and separating medication doses.  -Sent in new Rx for Jardiance to [facility].  -Requested pt bring in EOB to show OOP Drug spend to next Rx appointment.

## 2017-06-25 NOTE — Progress Notes (Signed)
    S:     Chief Complaint  Patient presents with  . Medication Management    Diabetes    Patient arrives in good spirits, ambulating without assistance.  Presents for diabetes evaluation, education, and management at the request of Dr. Caryl Bis. Patient was referred on 09/19/16.  Patient was last seen by Primary Care Provider on 03/19/16 and A1c was found to be elevated in setting of medication noncompliance. Today, A1C found to be unchanged at 8.0% again. Patient reports CBGs have been all in the 100s. Does continue to endorse some medication noncompliance as he feels like pills "don't want to go down".  Reports noncompliance 2-3 days at a time. Otherwise, reports he has been feeling well, "eyes have been clear, I go all night without peeing". Denies issues with any wounds, checking feet daily. Reports increased stress with wife who is now bedridden and he lost his job again. Took meds <1 hr prior to this visit.   Insurance coverage/medication affordability: Per [facility] representative, patient has been approved for Jardiance effective 04/13/17 - 01/01/2018. Need new prescription to send it. Fax number is [phone] attn: refill new prescription. Does not qualify yet for Trulicity as patient has not met OOP drug spend. Reports he is in the donut hole already.  Patient denies adherence with medications.  Current diabetes medications include: metformin 1000 mg BID, Jardiance 25 mg daily, Trulicity 3.14 mg weekly.  Current hypertension medications include: losartan 100 mg daily, amlodipine 10 mg daily, metoprolol succinate 100 mg daily, furosemide 20 mg daily.   Patient denies hypoglycemic events.  O:  Physical Exam  Constitutional: He appears well-developed and well-nourished.     Review of Systems  Gastrointestinal: Negative for abdominal pain, nausea and vomiting.  Genitourinary: Negative for dysuria, frequency and urgency.     Lab Results  Component Value Date   HGBA1C 8.0 (H)  03/19/2017   There were no vitals filed for this visit.  Lipid Panel     Component Value Date/Time   CHOL 117 08/07/2016 1156   TRIG 316 (H) 08/07/2016 1156   HDL 31 (L) 08/07/2016 1156   CHOLHDL 3.8 08/07/2016 1156   VLDL 63 (H) 08/07/2016 1156   LDLCALC 23 08/07/2016 1156   LDLDIRECT 51.0 03/19/2017 0845   A/P: Diabetes longstanding currently uncontrolled as evidenced by A1C unchanged from previous. H/o control on these medications however his control is now limited by medication noncompliance. Patient is able to verbalize appropriate hypoglycemia management plan.  -A1C today -Continue current medications. Discussed methods to increase compliance including splitting metformin and separating medication doses.  -Sent in new Rx for Jardiance to [facility].  -Requested pt bring in EOB to show OOP Drug spend to next Rx appointment.   ASCVD risk - primary prevention in patient with DM. Last LDL is controlled.   -Continued aspirin 325 mg  -Continued atorvastatin 40 mg.   Hypertension longstanding currently uncontrolled.  BP goal = <130/80 mmHg. Patient is not adherent with medication. Control is suboptimal due to medication timing. - No changes to meds, counseled on dietary interventions for control of BP.  -F/u at future visit.   Written patient instructions provided.  Total time in face to face counseling 30 minutes.  Of note, visit was interrupted by fire alarm.    F/u in Rx clinic visit in August, 2019.   Carlean Jews, Pharm.D., BCPS PGY2 Ambulatory Care Pharmacy Resident Phone: [phone]

## 2017-06-25 NOTE — Assessment & Plan Note (Signed)
ASCVD risk - primary prevention in patient with DM. Last LDL is controlled.   -Continued aspirin 325 mg  -Continued atorvastatin 40 mg.

## 2017-06-29 NOTE — Progress Notes (Signed)
I have reviewed the above note and agree. I was available to the pharmacist for consultation.  Imanol Bihl, MD  

## 2017-07-02 ENCOUNTER — Encounter: Payer: Self-pay | Admitting: Family Medicine

## 2017-07-02 ENCOUNTER — Ambulatory Visit (INDEPENDENT_AMBULATORY_CARE_PROVIDER_SITE_OTHER): Payer: Medicare Other | Admitting: Family Medicine

## 2017-07-02 VITALS — BP 118/80 | HR 57 | Temp 98.3°F | Ht 66.0 in | Wt 180.0 lb

## 2017-07-02 DIAGNOSIS — R1310 Dysphagia, unspecified: Secondary | ICD-10-CM | POA: Diagnosis not present

## 2017-07-02 DIAGNOSIS — E1140 Type 2 diabetes mellitus with diabetic neuropathy, unspecified: Secondary | ICD-10-CM | POA: Diagnosis not present

## 2017-07-02 DIAGNOSIS — I10 Essential (primary) hypertension: Secondary | ICD-10-CM

## 2017-07-02 DIAGNOSIS — E1165 Type 2 diabetes mellitus with hyperglycemia: Secondary | ICD-10-CM | POA: Diagnosis not present

## 2017-07-02 DIAGNOSIS — E785 Hyperlipidemia, unspecified: Secondary | ICD-10-CM

## 2017-07-02 DIAGNOSIS — IMO0002 Reserved for concepts with insufficient information to code with codable children: Secondary | ICD-10-CM

## 2017-07-02 NOTE — Assessment & Plan Note (Signed)
-  Continue Lipitor °

## 2017-07-02 NOTE — Patient Instructions (Signed)
Nice to see you. We can have you see ENT to have them evaluate your swallowing issue. Please continue your current medications otherwise and try to stay stretched out with your legs to help with the cramps.

## 2017-07-02 NOTE — Progress Notes (Signed)
  [name], MD Phone: [phone]  Shane Jones is a 72 y.o. male who presents today for f/u.  CC: htn, dm, hld, pills stick in throat when swallowing  HYPERTENSION Disease Monitoring: Blood pressure [id number] systolic Chest pain- no      Dyspnea- no Medications: Compliance- taking amlodipine, losartan, Lasix, metoprolol    Edema- no  DIABETES Disease Monitoring: Blood Sugar ranges-135 avg Polyuria/phagia/dipsia- no      Optho- UTD Medications: Compliance- taking trulicity, jardiance, metformin Hypoglycemic symptoms- no  HYPERLIPIDEMIA Disease Monitoring: See symptoms for Hypertension Medications: Compliance- taking lipitor Right upper quadrant pain- no  Muscle aches- no  Patient reports intermittently recently has had trouble with pills getting stuck in the back of his throat when he swallows.  He had grapes to do this as well.  He notes it is above his thyroid cartilage.  He will have to swallow multiple swigs of liquid to get it to go down.  No history of EGD.  No blood in his stool.  No reflux symptoms.  No postnasal drip.  He quit smoking in 1980.  He does not drink much alcohol.    Social History   Tobacco Use  Smoking Status Former Smoker  . Last attempt to quit: 11/20/1978  . Years since quitting: 38.6  Smokeless Tobacco Never Used     ROS see history of present illness  Objective  Physical Exam Vitals:   07/02/17 0909  BP: 118/80  Pulse: (!) 57  Temp: 98.3 F (36.8 C)  SpO2: 94%    BP Readings from Last 3 Encounters:  07/02/17 118/80  06/25/17 (!) 144/99  03/19/17 (!) 144/92   Wt Readings from Last 3 Encounters:  07/02/17 180 lb (81.6 kg)  06/25/17 184 lb (83.5 kg)  03/19/17 184 lb 3.2 oz (83.6 kg)    Physical Exam  Constitutional: No distress.  HENT:  Head: Normocephalic.  Mouth/Throat: Oropharynx is clear and moist.  Neck: Neck supple.  Cardiovascular: Normal rate, regular rhythm and normal heart sounds.  Pulmonary/Chest:  Effort normal and breath sounds normal.  Musculoskeletal: He exhibits no edema.  Lymphadenopathy:    He has no cervical adenopathy.  Neurological: He is alert.  Skin: Skin is warm and dry. He is not diaphoretic.     Assessment/Plan: Please see individual problem list.  Hypertension Well-controlled.  Continue current regimen.  Type 2 diabetes, uncontrolled, with neuropathy ([place]) He will try to remain compliant.  Recheck in 3 months.  Continue current regimen.  Hyperlipidemia Continue Lipitor.  Food sticks on swallowing Patient with food and pills that are occasionally sticking in his throat.  Discussed that this is abnormal.  Given that it occurs in his throat and not down into his esophagus in his chest we will have him see ENT to start with to consider direct visualization.  Referral placed.   Orders Placed This Encounter  Procedures  . Ambulatory referral to ENT    Referral Priority:   Routine    Referral Type:   Consultation    Referral Reason:   Specialty Services Required    Requested Specialty:   Otolaryngology    Number of Visits Requested:   1    No orders of the defined types were placed in this encounter.    [name], MD [place]

## 2017-07-02 NOTE — Assessment & Plan Note (Signed)
He will try to remain compliant.  Recheck in 3 months.  Continue current regimen.

## 2017-07-02 NOTE — Assessment & Plan Note (Signed)
Well-controlled.  Continue current regimen. 

## 2017-07-02 NOTE — Assessment & Plan Note (Signed)
Patient with food and pills that are occasionally sticking in his throat.  Discussed that this is abnormal.  Given that it occurs in his throat and not down into his esophagus in his chest we will have him see ENT to start with to consider direct visualization.  Referral placed.

## 2017-07-13 ENCOUNTER — Other Ambulatory Visit: Payer: Self-pay | Admitting: Physician Assistant

## 2017-07-13 DIAGNOSIS — R52 Pain, unspecified: Secondary | ICD-10-CM

## 2017-07-13 DIAGNOSIS — K219 Gastro-esophageal reflux disease without esophagitis: Secondary | ICD-10-CM | POA: Diagnosis not present

## 2017-07-13 DIAGNOSIS — R1310 Dysphagia, unspecified: Secondary | ICD-10-CM | POA: Diagnosis not present

## 2017-07-13 DIAGNOSIS — R4702 Dysphasia: Secondary | ICD-10-CM

## 2017-07-19 ENCOUNTER — Ambulatory Visit
Admission: RE | Admit: 2017-07-19 | Discharge: 2017-07-19 | Disposition: A | Discharge status: Home / Self Care | Payer: Medicare Other | Source: Ambulatory Visit | Attending: Physician Assistant | Admitting: Physician Assistant

## 2017-07-19 DIAGNOSIS — K228 Other specified diseases of esophagus: Secondary | ICD-10-CM | POA: Diagnosis not present

## 2017-07-19 DIAGNOSIS — M47892 Other spondylosis, cervical region: Secondary | ICD-10-CM | POA: Insufficient documentation

## 2017-07-19 DIAGNOSIS — R4702 Dysphasia: Secondary | ICD-10-CM | POA: Diagnosis not present

## 2017-07-19 DIAGNOSIS — K449 Diaphragmatic hernia without obstruction or gangrene: Secondary | ICD-10-CM | POA: Insufficient documentation

## 2017-07-19 DIAGNOSIS — M4312 Spondylolisthesis, cervical region: Secondary | ICD-10-CM | POA: Diagnosis not present

## 2017-07-30 ENCOUNTER — Telehealth: Payer: Self-pay | Admitting: Pharmacist

## 2017-07-30 NOTE — Telephone Encounter (Signed)
Patient last seen in Pharmacy Clinic on 06/25/2017. Instructed to schedule a f/u appointment with me, however, my schedule was just opened today.   Left a HIPAA compliant message asking patient to schedule appointment with me at his convenience.    Catie Darnelle Maffucci, PharmD PGY2 Ambulatory Care Pharmacy Resident Phone: [phone]

## 2017-08-09 ENCOUNTER — Other Ambulatory Visit: Payer: Self-pay | Admitting: Family Medicine

## 2017-08-15 ENCOUNTER — Other Ambulatory Visit: Payer: Self-pay | Admitting: Family Medicine

## 2017-08-20 ENCOUNTER — Telehealth: Payer: Self-pay

## 2017-08-20 NOTE — Telephone Encounter (Signed)
Copied from [place] [id number]. Topic: General - Other >> Aug 20, 2017  3:28 PM Yvette Rack wrote: Reason for CRM: pt calling wanting to know if the assistant has filled out paper work for the [facility] ([place]) 9.61 TE/4.3DT SOPN he states that this has been done before because he cant pay over$600 for medicine  pt would like a call back today [phone]

## 2017-08-20 NOTE — Telephone Encounter (Signed)
Please advise 

## 2017-08-20 NOTE — Telephone Encounter (Signed)
Patient contacted me today to ask about the status of Trulicity patient assistance application.   Per patient, he is unsure if he has met the OOP spend, as he fills at two different pharmacies.   Scheduled appointment for patient to come to clinic, we will call the pharmacies together to determine total OOP spend.    Catie Darnelle Maffucci, PharmD PGY2 Ambulatory Care Pharmacy Resident Phone: [phone]

## 2017-08-27 ENCOUNTER — Ambulatory Visit (INDEPENDENT_AMBULATORY_CARE_PROVIDER_SITE_OTHER): Payer: Medicare Other | Admitting: Pharmacist

## 2017-08-27 ENCOUNTER — Encounter: Payer: Self-pay | Admitting: Pharmacist

## 2017-08-27 DIAGNOSIS — E1165 Type 2 diabetes mellitus with hyperglycemia: Secondary | ICD-10-CM | POA: Diagnosis not present

## 2017-08-27 DIAGNOSIS — E1140 Type 2 diabetes mellitus with diabetic neuropathy, unspecified: Secondary | ICD-10-CM

## 2017-08-27 DIAGNOSIS — IMO0002 Reserved for concepts with insufficient information to code with codable children: Secondary | ICD-10-CM

## 2017-08-27 MED ORDER — DULAGLUTIDE 1.5 MG/0.5ML ~~LOC~~ SOAJ: 1.0000 "pen " | SUBCUTANEOUS | 0 refills | Status: DC

## 2017-08-27 NOTE — Progress Notes (Signed)
   S:     Chief Complaint  Patient presents with  . Medication Management    Patient Assistance, Diabetes    Patient arrives in good spirits, ambulating without assistance.  Presents for diabetes evaluation, education, and management at the request of Dr. Caryl Bis (referred on 09/19/2017). Last seen by primary care provider on 07/02/2017. Last seen in Pharmacy clinic on 06/25/2017. Patient contacted me last week to ask about patient assistance for Trulicity.  Insurance coverage/medication affordability: [employer]; Patient Assistance for Jardiance through 01/01/2018; Has not met out of pocket spend for Trulicity as of today  Patient denies adherence with medications - has been taking Trulicity every other week to stretch his home supply Current diabetes medications include: Metformin 1000 mg BID, Jardiance 25 mg daily, Trulicity 5.62 mg weekly (taking every other week) Current hypertension medications include: Amlodipine 10 mg daily, losartan 100 mg daily, metoprolol succinate 100 mg daily   Patient denies hypoglycemic events.  O:  Physical Exam  Constitutional: He appears well-developed and well-nourished.   Review of Systems  All other systems reviewed and are negative.    Lab Results  Component Value Date   HGBA1C 8.0 (A) 06/25/2017   Vitals:   08/27/17 1202  BP: (!) 153/105  Pulse: 65  SpO2: 97%   BMP Latest Ref Rng & Units 03/19/2017 10/30/2016 10/16/2016  Glucose 70 - 99 mg/dL 146(H) 250(H) -  BUN 6 - 23 mg/dL 25(H) 13 -  Creatinine 0.40 - 1.50 mg/dL 1.44 1.21 -  Sodium 135 - 145 mEq/L 145 143 -  Potassium 3.5 - 5.1 mEq/L 4.3 3.8 3.3(L)  Chloride 96 - 112 mEq/L 102 104 -  CO2 19 - 32 mEq/L 31 31 -  Calcium 8.4 - 10.5 mg/dL 10.4 9.5 -   CrCl ~ 40 mL/min, eGFR ~ 50 mL/min/1.47m  Lipid Panel     Component Value Date/Time   CHOL 117 08/07/2016 1156   TRIG 316 (H) 08/07/2016 1156   HDL 31 (L) 08/07/2016 1156   CHOLHDL 3.8 08/07/2016 1156   VLDL 63 (H) 08/07/2016  1156   LDLCALC 23 08/07/2016 1156   LDLDIRECT 51.0 03/19/2017 0845   Random SMBG: 150-190s  Clinical ASCVD: No  ASCVD risk factors: age 74-75 A/P: Following discussion and approval by Dr. SCaryl Bis the following medication changes were made:   #Diabetes longstanding currently uncontrolled. Patient denies hypoglycemic events. Patient denies adherence with medication. Control is suboptimal due to financial burden of therapy. - Completed patient assistance application for Trulicity as well as hardship letter asking [employer] to waive out of pocket spend requirement - Increased Trulicity to 1.5 mg weekly. Provided 1 month of samples to patient of this strength.  - Continue metformin 1000 mg BID, Jardiance 25 mg daily - Next A1C anticipated 09/2017.    Written patient instructions provided.  Total time in face to face counseling 45 minutes.    Follow up in Pharmacist Clinic Visit 4 weeks.  CDe Hollingshead PharmD PGY2 Ambulatory Care Pharmacy Resident Phone: [phone]

## 2017-08-27 NOTE — Patient Instructions (Signed)
It was great to meet you today!   I will submit the patient assistance program application to [employer] for Trulicity, along with the hardship letter. Hopefully, they will approve you for the medication, even though you haven't spent the out of pocket amount of $1100.   In the meantime, I am giving you samples of the higher dose of Trulicity, 1.5 mg weekly. Next week, instead of using the 0.75 mg dose, use a 1.5 mg dose. You may experience some upset stomach while starting this increased dose. Call the clinic if you have significant stomach pain.  Keep checking your blood sugar for me. We would like to see:  - Fasting (morning) sugars: Less than 130 - 2 hours after meal sugars: Less than 180 These correspond to an A1c of <7%.    Schedule follow up with me in 4-5 weeks to follow up on your blood sugars.   Have a great day!  Catie Darnelle Maffucci, PharmD

## 2017-08-27 NOTE — Assessment & Plan Note (Signed)
#  Diabetes longstanding currently uncontrolled. Patient denies hypoglycemic events. Patient denies adherence with medication. Control is suboptimal due to financial burden of therapy. - Completed patient assistance application for Trulicity as well as hardship letter asking [employer] to waive out of pocket spend requirement - Increased Trulicity to 1.5 mg weekly. Provided 1 month of samples to patient of this strength.  - Continue metformin 1000 mg BID, Jardiance 25 mg daily - Next A1C anticipated 09/2017.

## 2017-08-30 NOTE — Progress Notes (Signed)
I have reviewed the above note and agree.  I saw the patient with the clinical pharmacist.  I will forward back to her to follow-up on the patient's blood pressure as it had been well controlled previously.  [name], MD

## 2017-09-07 ENCOUNTER — Telehealth: Payer: Self-pay | Admitting: Pharmacist

## 2017-09-07 NOTE — Telephone Encounter (Signed)
Contacted [employer] to f/u on Patient Assistance application. Patient has been approved for Trulicity assistance [id number]. They stated that the medication has been shipped and should be delivered to the office by the end of business today.    Contacted patient to inform. He was extremely grateful for the office's help.   Will route this to Dr. Caryl Bis and Kerin Salen, LPN, so that the team is aware that the medication will be delivered today, and will contact patient to pick up from office.    Catie Darnelle Maffucci, PharmD PGY2 Ambulatory Care Pharmacy Resident Phone: [phone]

## 2017-09-07 NOTE — Telephone Encounter (Signed)
Notified patient medication here in office labeled and ready for pickup.

## 2017-09-25 ENCOUNTER — Encounter: Payer: Self-pay | Admitting: Gastroenterology

## 2017-09-25 ENCOUNTER — Other Ambulatory Visit: Payer: Self-pay

## 2017-09-25 ENCOUNTER — Ambulatory Visit (INDEPENDENT_AMBULATORY_CARE_PROVIDER_SITE_OTHER): Payer: Medicare Other | Admitting: Gastroenterology

## 2017-09-25 VITALS — BP 144/83 | HR 73 | Ht 66.0 in | Wt 179.6 lb

## 2017-09-25 DIAGNOSIS — R4702 Dysphasia: Secondary | ICD-10-CM | POA: Diagnosis not present

## 2017-09-25 NOTE — H&P (View-Only) (Signed)
Gastroenterology Consultation  Referring Provider:     Clyde Canterbury, MD Primary Care Physician:  Leone Haven, MD Primary Gastroenterologist:  Dr. Allen Norris     Reason for Consultation:     Dysphasia        HPI:   Shane Jones is a 72 y.o. y/o male referred for consultation & management of dysphasia by Dr. Caryl Bis, Angela Adam, MD.  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  The patient slowly pulled over the side of the road and his co-driver came to ask what was wrong.  The patient states that he was able to bring up the food and continue on his way.  Since then the patient states he has been capful to avoid eating too big of bites.  He also reports that he chews his food well so that he does not choke on his food.  There is no report of any unexplained weight loss and he states he is lost weight because of trying.  He denies any black stools or bloody stools.  He had a colonoscopy in 2012 and he reported to have been normal so he is not due till 2022.  Past Medical History:  Diagnosis Date  . Gout   . Hypertension     History reviewed. No pertinent surgical history.  Prior to Admission medications   Medication Sig Start Date End Date Taking? Authorizing Provider  allopurinol (ZYLOPRIM) 100 MG tablet TAKE 1 TABLET DAILY 09/11/16  Yes Leone Haven, MD  amLODipine (NORVASC) 10 MG tablet TAKE 1 TABLET EVERY DAY 08/16/17  Yes Leone Haven, MD  aspirin 325 MG tablet Take 325 mg by mouth daily.   Yes [provider]  atorvastatin (LIPITOR) 40 MG tablet TAKE 1 TABLET EVERY DAY 09/11/16  Yes Leone Haven, MD  B Complex Vitamins (VITAMIN-B COMPLEX PO) Take by mouth.   Yes [provider]  BETA CAROTENE PO Take by mouth.   Yes [provider]  Calcium  Carbonate-Vitamin D (CALCIUM + D PO) Take by mouth.   Yes [provider]  Dulaglutide (TRULICITY) 1.5 MG/8.6PY SOPN Inject 1 pen into the skin once a week. 08/27/17  Yes Leone Haven, MD  empagliflozin (JARDIANCE) 25 MG TABS tablet Take 25 mg by mouth daily. 06/25/17  Yes Leone Haven, MD  escitalopram (LEXAPRO) 10 MG tablet TAKE 1 TABLET(10 MG) BY MOUTH DAILY 04/23/17  Yes Leone Haven, MD  furosemide (LASIX) 20 MG tablet TAKE 1 TABLET (20 MG TOTAL) DAILY 08/16/17  Yes Leone Haven, MD  gabapentin (NEURONTIN) 400 MG capsule Take 1 capsule (400 mg total) by mouth 3 (three) times daily. 09/06/16  Yes Cook, Jayce G, DO  glucose blood (TRUETRACK TEST) test strip USE TO CHECK DAILY AS DIRECTED 04/23/17  Yes Leone Haven, MD  losartan (COZAAR) 100 MG tablet TAKE 1 TABLET EVERY DAY 08/16/17  Yes Leone Haven, MD  magnesium 30 MG tablet Take 30 mg by mouth 2 (two) times daily.   Yes [provider]  metFORMIN (GLUCOPHAGE) 1000 MG tablet Take 1 tablet (1,000 mg total) by mouth 2 (two) times daily with a meal. 12/15/16  Yes Leone Haven, MD  metoprolol succinate (TOPROL-XL) 100 MG 24 hr tablet  TAKE 1 TABLET EVERY DAY 04/23/17  Yes Leone Haven, MD  Multiple Vitamin (MULTIVITAMIN) tablet Take 1 tablet by mouth daily.   Yes [provider]  nabumetone (RELAFEN) 500 MG tablet TAKE 1 TABLET TWICE DAILY 08/16/17  Yes Leone Haven, MD  potassium chloride (K-DUR) 10 MEQ tablet TAKE 1 TABLET BY MOUTH DAILY WITH LASIX 08/09/17  Yes Leone Haven, MD  Zinc Sulfate (ZINC 15 PO) Take by mouth.   Yes [provider]    History reviewed. No pertinent family history.   Social History   Tobacco Use  . Smoking status: Former Smoker    Last attempt to quit: 11/20/1978    Years since quitting: 38.8  . Smokeless tobacco: Never Used  Substance Use Topics  . Alcohol use: No  . Drug use: No    Allergies as of 09/25/2017 - Review  Complete 09/25/2017  Allergen Reaction Noted  . Uloric [febuxostat] Hives 10/13/2010    Review of Systems:    All systems reviewed and negative except where noted in HPI.   Physical Exam:  BP (!) 144/83   Pulse 73   Ht 5\' 6"  (1.676 m)   Wt 179 lb 9.6 oz (81.5 kg)   BMI 28.99 kg/m  No LMP for male patient. General:   Alert,  Well-developed, well-nourished, pleasant and cooperative in NAD Head:  Normocephalic and atraumatic. Eyes:  Sclera clear, no icterus.   Conjunctiva pink. Ears:  Normal auditory acuity. Nose:  No deformity, discharge, or lesions. Mouth:  No deformity or lesions,oropharynx pink & moist. Neck:  Supple; no masses or thyromegaly. Lungs:  Respirations even and unlabored.  Clear throughout to auscultation.   No wheezes, crackles, or rhonchi. No acute distress. Heart:  Regular rate and rhythm; no murmurs, clicks, rubs, or gallops. Abdomen:  Normal bowel sounds.  No bruits.  Soft, non-tender and non-distended without masses, hepatosplenomegaly or hernias noted.  No guarding or rebound tenderness.  Negative Carnett sign.   Rectal:  Deferred.  Msk:  Symmetrical without gross deformities.  Good, equal movement & strength bilaterally. Pulses:  Normal pulses noted. Extremities:  No clubbing or edema.  No cyanosis. Neurologic:  Alert and oriented x3;  grossly normal neurologically. Skin:  Intact without significant lesions or rashes.  No jaundice. Lymph Nodes:  No significant cervical adenopathy. Psych:  Alert and cooperative. Normal mood and affect.  Imaging Studies: No results found.  Assessment and Plan:   Shane Jones is a 72 y.o. y/o male who comes in today with a history of dysphasia to solids more than liquids.  The patient will be set up for an EGD to rule out any neoplasm or esophageal stricture.  The patient does not have any worry symptoms such as hematemesis black stools or bloody stools. I have discussed risks & benefits which include, but are not limited to,  bleeding, infection, perforation & drug reaction.  The patient agrees with this plan & written consent will be obtained.    Lucilla Lame, MD. [facility] Regal    Note: This dictation was prepared with Dragon dictation along with smaller phrase technology. Any transcriptional errors that result from this process are unintentional.

## 2017-09-25 NOTE — Progress Notes (Signed)
Gastroenterology Consultation  Referring Provider:     Clyde Canterbury, MD Primary Care Physician:  Leone Haven, MD Primary Gastroenterologist:  Dr. Allen Norris     Reason for Consultation:     Dysphasia        HPI:   Shane Jones is a 72 y.o. y/o male referred for consultation & management of dysphasia by Dr. Caryl Bis, Angela Adam, MD.  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  The patient slowly pulled over the side of the road and his co-driver came to ask what was wrong.  The patient states that he was able to bring up the food and continue on his way.  Since then the patient states he has been capful to avoid eating too big of bites.  He also reports that he chews his food well so that he does not choke on his food.  There is no report of any unexplained weight loss and he states he is lost weight because of trying.  He denies any black stools or bloody stools.  He had a colonoscopy in 2012 and he reported to have been normal so he is not due till 2022.  Past Medical History:  Diagnosis Date  . Gout   . Hypertension     History reviewed. No pertinent surgical history.  Prior to Admission medications   Medication Sig Start Date End Date Taking? Authorizing Provider  allopurinol (ZYLOPRIM) 100 MG tablet TAKE 1 TABLET DAILY 09/11/16  Yes Leone Haven, MD  amLODipine (NORVASC) 10 MG tablet TAKE 1 TABLET EVERY DAY 08/16/17  Yes Leone Haven, MD  aspirin 325 MG tablet Take 325 mg by mouth daily.   Yes [provider]  atorvastatin (LIPITOR) 40 MG tablet TAKE 1 TABLET EVERY DAY 09/11/16  Yes Leone Haven, MD  B Complex Vitamins (VITAMIN-B COMPLEX PO) Take by mouth.   Yes [provider]  BETA CAROTENE PO Take by mouth.   Yes [provider]  Calcium  Carbonate-Vitamin D (CALCIUM + D PO) Take by mouth.   Yes [provider]  Dulaglutide (TRULICITY) 1.5 GU/4.4IH SOPN Inject 1 pen into the skin once a week. 08/27/17  Yes Leone Haven, MD  empagliflozin (JARDIANCE) 25 MG TABS tablet Take 25 mg by mouth daily. 06/25/17  Yes Leone Haven, MD  escitalopram (LEXAPRO) 10 MG tablet TAKE 1 TABLET(10 MG) BY MOUTH DAILY 04/23/17  Yes Leone Haven, MD  furosemide (LASIX) 20 MG tablet TAKE 1 TABLET (20 MG TOTAL) DAILY 08/16/17  Yes Leone Haven, MD  gabapentin (NEURONTIN) 400 MG capsule Take 1 capsule (400 mg total) by mouth 3 (three) times daily. 09/06/16  Yes Cook, Jayce G, DO  glucose blood (TRUETRACK TEST) test strip USE TO CHECK DAILY AS DIRECTED 04/23/17  Yes Leone Haven, MD  losartan (COZAAR) 100 MG tablet TAKE 1 TABLET EVERY DAY 08/16/17  Yes Leone Haven, MD  magnesium 30 MG tablet Take 30 mg by mouth 2 (two) times daily.   Yes [provider]  metFORMIN (GLUCOPHAGE) 1000 MG tablet Take 1 tablet (1,000 mg total) by mouth 2 (two) times daily with a meal. 12/15/16  Yes Leone Haven, MD  metoprolol succinate (TOPROL-XL) 100 MG 24 hr tablet  TAKE 1 TABLET EVERY DAY 04/23/17  Yes Leone Haven, MD  Multiple Vitamin (MULTIVITAMIN) tablet Take 1 tablet by mouth daily.   Yes [provider]  nabumetone (RELAFEN) 500 MG tablet TAKE 1 TABLET TWICE DAILY 08/16/17  Yes Leone Haven, MD  potassium chloride (K-DUR) 10 MEQ tablet TAKE 1 TABLET BY MOUTH DAILY WITH LASIX 08/09/17  Yes Leone Haven, MD  Zinc Sulfate (ZINC 15 PO) Take by mouth.   Yes [provider]    History reviewed. No pertinent family history.   Social History   Tobacco Use  . Smoking status: Former Smoker    Last attempt to quit: 11/20/1978    Years since quitting: 38.8  . Smokeless tobacco: Never Used  Substance Use Topics  . Alcohol use: No  . Drug use: No    Allergies as of 09/25/2017 - Review  Complete 09/25/2017  Allergen Reaction Noted  . Uloric [febuxostat] Hives 10/13/2010    Review of Systems:    All systems reviewed and negative except where noted in HPI.   Physical Exam:  BP (!) 144/83   Pulse 73   Ht 5\' 6"  (1.676 m)   Wt 179 lb 9.6 oz (81.5 kg)   BMI 28.99 kg/m  No LMP for male patient. General:   Alert,  Well-developed, well-nourished, pleasant and cooperative in NAD Head:  Normocephalic and atraumatic. Eyes:  Sclera clear, no icterus.   Conjunctiva pink. Ears:  Normal auditory acuity. Nose:  No deformity, discharge, or lesions. Mouth:  No deformity or lesions,oropharynx pink & moist. Neck:  Supple; no masses or thyromegaly. Lungs:  Respirations even and unlabored.  Clear throughout to auscultation.   No wheezes, crackles, or rhonchi. No acute distress. Heart:  Regular rate and rhythm; no murmurs, clicks, rubs, or gallops. Abdomen:  Normal bowel sounds.  No bruits.  Soft, non-tender and non-distended without masses, hepatosplenomegaly or hernias noted.  No guarding or rebound tenderness.  Negative Carnett sign.   Rectal:  Deferred.  Msk:  Symmetrical without gross deformities.  Good, equal movement & strength bilaterally. Pulses:  Normal pulses noted. Extremities:  No clubbing or edema.  No cyanosis. Neurologic:  Alert and oriented x3;  grossly normal neurologically. Skin:  Intact without significant lesions or rashes.  No jaundice. Lymph Nodes:  No significant cervical adenopathy. Psych:  Alert and cooperative. Normal mood and affect.  Imaging Studies: No results found.  Assessment and Plan:   Shane Jones is a 72 y.o. y/o male who comes in today with a history of dysphasia to solids more than liquids.  The patient will be set up for an EGD to rule out any neoplasm or esophageal stricture.  The patient does not have any worry symptoms such as hematemesis black stools or bloody stools. I have discussed risks & benefits which include, but are not limited to,  bleeding, infection, perforation & drug reaction.  The patient agrees with this plan & written consent will be obtained.    Lucilla Lame, MD. [facility] Regal    Note: This dictation was prepared with Dragon dictation along with smaller phrase technology. Any transcriptional errors that result from this process are unintentional.

## 2017-09-26 ENCOUNTER — Other Ambulatory Visit: Payer: Self-pay

## 2017-09-26 DIAGNOSIS — R4702 Dysphasia: Secondary | ICD-10-CM

## 2017-10-01 ENCOUNTER — Encounter: Payer: Self-pay | Admitting: Pharmacist

## 2017-10-01 ENCOUNTER — Ambulatory Visit (INDEPENDENT_AMBULATORY_CARE_PROVIDER_SITE_OTHER): Payer: Medicare Other | Admitting: Pharmacist

## 2017-10-01 VITALS — BP 128/87 | HR 72 | Wt 175.4 lb

## 2017-10-01 DIAGNOSIS — E1140 Type 2 diabetes mellitus with diabetic neuropathy, unspecified: Secondary | ICD-10-CM

## 2017-10-01 DIAGNOSIS — I10 Essential (primary) hypertension: Secondary | ICD-10-CM

## 2017-10-01 DIAGNOSIS — E1165 Type 2 diabetes mellitus with hyperglycemia: Secondary | ICD-10-CM | POA: Diagnosis not present

## 2017-10-01 DIAGNOSIS — IMO0002 Reserved for concepts with insufficient information to code with codable children: Secondary | ICD-10-CM

## 2017-10-01 LAB — POCT GLYCOSYLATED HEMOGLOBIN (HGB A1C): Hemoglobin A1C: 8.4 % — AB (ref 4.0–5.6)

## 2017-10-01 NOTE — Patient Instructions (Signed)
It was great to see you today!  We are not going to make any medication changes today. Instead, we are going to focus on taking your medications every day (or every week for Trulicity). You could consider getting a pill box to help you organize your medications, and know when you've taken them or not.   Anything you can do to increase your physical activity would make a huge positive impact on your sugar.   Use the handout I've provided to help focus on foods that have a lower impact on your sugars.  Schedule follow up with me in 4-5 weeks. Please bring your blood sugar meter to that appointment.    Take care,  Catie Darnelle Maffucci, PharmD

## 2017-10-01 NOTE — Assessment & Plan Note (Signed)
#  Diabetes - Currently uncontrolled, most recent A1c 8.4% today, worsened from 8.0% on 06/2017; Goal A1c <7% Patient denies hypoglycemic events. Patient denies adherence with medication. Control is suboptimal due to medication nonadherence, lifestyle choices. - As Trulicity dose was recently increased, will opt to continue current regimen at this time and focus on dietary/lifestyle modifications. Provided handouts on better glycemic choices; discussed the benefit he would get from going to the gym - Discussed importance of medication adherence; strategies to improve adherence - Continue metformin 6986 mg BID, Trulicity 1.5 mg weekly, Jardiance 25 mg daily

## 2017-10-01 NOTE — Progress Notes (Signed)
S:     Chief Complaint  Patient presents with  . Medication Management    Diabetes    Patient arrives in good spirits, ambulating without assistance.  Presents for hypertension and diabetes evaluation, education, and management at the request of Dr. Caryl Bis (referred on 09/19/2017). Last seen by primary care provider on 07/02/2017. Last seen in Pharmacy clinic on 08/27/2017 - at that time, Trulicity was increased to 1.5 mg weekly.   Since then, he does note some occasional stomach upset, but not badly enough to want to discontinue therapy. He notes continued swallowing difficulties, and is following up with GI for an EGD in 2 weeks. 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   Insurance coverage/medication affordability: [employer] [place]; Receives Trulicity and Jardiance from patient assistance  Patient initially reports adherence with medications, but upon review, notes that his stomach upset has resulted in him taking medications "off schedule" Current diabetes medications include: metformin 5625 mg BID, Trulicity 1.5 mg weekly, Jardiance 25 mg daily Current hypertension medications include:  losartan 100 mg daily, furosemide 20 mg daily, metoprolol succinate 100 mg daily, amlodipine 10 mg daily  Patient denies hypoglycemic s/sx including dizziness, shakiness, sweating. Patient denies hyperglycemic symptoms including polyuria, polydipsia, polyphagia, nocturia, neuropathy, blurred vision.   Patient reported dietary habits:  - Breakfast: Eggs + bacon, toast; Cheerios, but very seldomly; often doesn't eat breakfast - Lunch: doesn't eat lunch - Supper: Salads; beets; green beans; turnip greens;  - Drinks: water   Has a gym membership, but has not been; noting transportation concerns  O:  Physical Exam  Constitutional: He appears well-developed and  well-nourished.  Vitals reviewed.   Review of Systems  All other systems reviewed and are negative.  Lab Results  Component Value Date   HGBA1C 8.4 (A) 10/01/2017   Vitals:   10/01/17 1123  BP: 128/87  Pulse: 72    Basic Metabolic Panel BMP Latest Ref Rng & Units 03/19/2017 10/30/2016 10/16/2016  Glucose 70 - 99 mg/dL 146(H) 250(H) -  BUN 6 - 23 mg/dL 25(H) 13 -  Creatinine 0.40 - 1.50 mg/dL 1.44 1.21 -  Sodium 135 - 145 mEq/L 145 143 -  Potassium 3.5 - 5.1 mEq/L 4.3 3.8 3.3(L)  Chloride 96 - 112 mEq/L 102 104 -  CO2 19 - 32 mEq/L 31 31 -  Calcium 8.4 - 10.5 mg/dL 10.4 9.5 -     Lipid Panel     Component Value Date/Time   CHOL 117 08/07/2016 1156   TRIG 316 (H) 08/07/2016 1156   HDL 31 (L) 08/07/2016 1156   CHOLHDL 3.8 08/07/2016 1156   VLDL 63 (H) 08/07/2016 1156   LDLCALC 23 08/07/2016 1156   LDLDIRECT 51.0 03/19/2017 0845    SMBG: He has not been checking, is unable to verbalize specific readings   Clinical ASCVD: No  ASCVD risk factors: age 59-75 10 Year ASCVD Risk: <20%   A/P: Following discussion and approval by Dr. Caryl Bis, the following medication changes were made:   #Diabetes - Currently uncontrolled, most recent A1c 8.4% today, worsened from 8.0% on 06/2017; Goal A1c <7% Patient denies hypoglycemic events. Patient denies adherence with medication. Control is suboptimal due to medication nonadherence, lifestyle choices. - As Trulicity dose was recently increased, will opt to continue current regimen at this time and focus on dietary/lifestyle modifications. Provided handouts on better  glycemic choices; discussed the benefit he would get from going to the gym - Discussed importance of medication adherence; strategies to improve adherence - Continue metformin 2119 mg BID, Trulicity 1.5 mg weekly, Jardiance 25 mg daily  #Hypertension currently controlled. Goal BP <130/80 Patient reports adherence with medication.  - Continue losartan 100 mg daily,  furosemide 20 mg daily, metoprolol succinate 100 mg daily, amlodipine 10 mg daily  Written patient instructions provided.  Total time in face to face counseling 40 minutes.    Follow up in Pharmacist Clinic Visit 6-7 weeks (3-4 weeks s/p upcoming PCP appointment).  De Hollingshead, PharmD PGY2 [facility] Pharmacy Resident Phone: [phone]

## 2017-10-01 NOTE — Progress Notes (Signed)
I have reviewed the above note and agree. I saw the patient with the pharmacist.  Zuriel Yeaman, M.D.  

## 2017-10-01 NOTE — Assessment & Plan Note (Signed)
#  Hypertension currently controlled. Goal BP <130/80 Patient reports adherence with medication.  - Continue losartan 100 mg daily, furosemide 20 mg daily, metoprolol succinate 100 mg daily, amlodipine 10 mg daily

## 2017-10-10 ENCOUNTER — Ambulatory Visit (INDEPENDENT_AMBULATORY_CARE_PROVIDER_SITE_OTHER): Payer: Medicare Other | Admitting: Family Medicine

## 2017-10-10 ENCOUNTER — Encounter: Payer: Self-pay | Admitting: Family Medicine

## 2017-10-10 VITALS — BP 130/80 | HR 78 | Temp 98.8°F | Ht 66.0 in | Wt 176.0 lb

## 2017-10-10 DIAGNOSIS — G4730 Sleep apnea, unspecified: Secondary | ICD-10-CM | POA: Diagnosis not present

## 2017-10-10 DIAGNOSIS — E1165 Type 2 diabetes mellitus with hyperglycemia: Secondary | ICD-10-CM | POA: Diagnosis not present

## 2017-10-10 DIAGNOSIS — I10 Essential (primary) hypertension: Secondary | ICD-10-CM

## 2017-10-10 DIAGNOSIS — E1140 Type 2 diabetes mellitus with diabetic neuropathy, unspecified: Secondary | ICD-10-CM

## 2017-10-10 DIAGNOSIS — Z23 Encounter for immunization: Secondary | ICD-10-CM

## 2017-10-10 DIAGNOSIS — IMO0002 Reserved for concepts with insufficient information to code with codable children: Secondary | ICD-10-CM

## 2017-10-10 DIAGNOSIS — R809 Proteinuria, unspecified: Secondary | ICD-10-CM | POA: Insufficient documentation

## 2017-10-10 LAB — POCT URINALYSIS DIPSTICK
BILIRUBIN UA: NEGATIVE
Blood, UA: NEGATIVE
GLUCOSE UA: NEGATIVE
Ketones, UA: NEGATIVE
Leukocytes, UA: NEGATIVE
Nitrite, UA: NEGATIVE
Protein, UA: POSITIVE — AB
Spec Grav, UA: <=1.005 — AB (ref 1.010–1.025)
Urobilinogen, UA: 0.2 E.U./dL
pH, UA: 5 (ref 5.0–8.0)

## 2017-10-10 NOTE — Assessment & Plan Note (Signed)
Patient with improving control.  He will continue his current regimen.

## 2017-10-10 NOTE — Progress Notes (Signed)
  [name], MD Phone: [phone]  Shane Jones is a 72 y.o. male who presents today for f/u.  CC: htn, dm, osa  HYPERTENSION  Disease Monitoring  Home BP Monitoring 117-125/67-87 Chest pain- no    Dyspnea- no Medications  Compliance-  Taking amlodipine, lasix, losartan, metoprolol.  Edema- no  Diabetes: Patient notes his A1c was in the low 100s this morning.  Seems to be better controlled on his current regimen of metformin, Jardiance, and Trulicity.  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  He needs this rechecked.  He does note some frothy urine.  He is on gabapentin.  He does have a prior diagnosis of peripheral neuropathy.  He notes no neuropathy symptoms currently.   OSA: The patient was previously diagnosed with this.  He was on CPAP for some time though has not been using it for some time.  He has not figured out how to get it clean.  He notes he cannot do an additional sleep study due to cost concerns.  He reports he does not snore anymore.    Social History   Tobacco Use  Smoking Status Former Smoker  . Last attempt to quit: 11/20/1978  . Years since quitting: 38.9  Smokeless Tobacco Never Used     ROS see history of present illness  Objective  Physical Exam Vitals:   10/10/17 1628  BP: 130/80  Pulse: 78  Temp: 98.8 F (37.1 C)  SpO2: 97%    BP Readings from Last 3 Encounters:  10/10/17 130/80  10/01/17 128/87  09/25/17 (!) 144/83   Wt Readings from Last 3 Encounters:  10/10/17 176 lb (79.8 kg)  10/01/17 175 lb 6.4 oz (79.6 kg)  09/25/17 179 lb 9.6 oz (81.5 kg)    Physical Exam  Constitutional: No distress.  Cardiovascular: Normal rate, regular rhythm and normal heart sounds.  Pulmonary/Chest: Effort normal and breath sounds normal.  Musculoskeletal: He exhibits no edema.  Neurological: He is alert.  Skin: Skin is warm and dry. He is not diaphoretic.   Diabetic Foot Exam - Simple   Simple Foot Form Visual  Inspection See comments:  Yes Sensation Testing Intact to touch and monofilament testing bilaterally:  Yes Pulse Check Posterior Tibialis and Dorsalis pulse intact bilaterally:  Yes Comments Patient with thickened elongated toenails, no other deformities or ulcerations, he has intact proprioception of his great toes      Assessment/Plan: Please see individual problem list.  Hypertension Adequately controlled at home.  He will continue his current regimen.  Sleep apnea Patient is no longer using his CPAP.  I discussed that he should be using this given his prior diagnosis.  Type 2 diabetes, uncontrolled, with neuropathy ([place]) Patient with improving control.  He will continue his current regimen.  Proteinuria Noted on recent urinalysis.  We will recheck with urine studies as listed below.  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  We will fax this note with the paperwork.  Orders Placed This Encounter  Procedures  . Flu vaccine HIGH DOSE PF  . Protein / creatinine ratio, urine  . Urine Microscopic Only  . POCT Urinalysis Dipstick    No orders of the defined types were placed in this encounter.    [name], MD [place]

## 2017-10-10 NOTE — Assessment & Plan Note (Signed)
Adequately controlled at home.  He will continue his current regimen. 

## 2017-10-10 NOTE — Assessment & Plan Note (Signed)
Patient is no longer using his CPAP.  I discussed that he should be using this given his prior diagnosis.

## 2017-10-10 NOTE — Patient Instructions (Signed)
Nice to see you. We will get your forms faxed.  We will contact you with your urine results.

## 2017-10-10 NOTE — Assessment & Plan Note (Signed)
Noted on recent urinalysis.  We will recheck with urine studies as listed below.

## 2017-10-11 ENCOUNTER — Telehealth: Payer: Self-pay | Admitting: Family Medicine

## 2017-10-11 LAB — URINALYSIS, MICROSCOPIC ONLY
Bacteria, UA: NONE SEEN /HPF
HYALINE CAST: NONE SEEN /LPF
RBC / HPF: NONE SEEN /HPF (ref 0–2)
Squamous Epithelial / LPF: NONE SEEN /HPF (ref <=5)
WBC UA: NONE SEEN /HPF (ref 0–5)

## 2017-10-11 LAB — PROTEIN / CREATININE RATIO, URINE
Creatinine, Urine: 63 mg/dL (ref 20–320)
PROTEIN/CREAT RATIO: 302 mg/g{creat} — AB (ref 22–128)
TOTAL PROTEIN, URINE: 19 mg/dL (ref 5–25)

## 2017-10-11 NOTE — Telephone Encounter (Signed)
Patient called to see if health forms had been faxed today to DOT forms faxed to office of NP [facility] along with OV note from 10/*9/19 to [phone]

## 2017-10-16 ENCOUNTER — Other Ambulatory Visit: Payer: Self-pay

## 2017-10-16 ENCOUNTER — Encounter: Payer: Self-pay | Admitting: *Deleted

## 2017-10-16 ENCOUNTER — Encounter
Admission: RE | Disposition: A | Discharge status: Home / Self Care | Payer: Self-pay | Source: Ambulatory Visit | Attending: Gastroenterology

## 2017-10-16 ENCOUNTER — Ambulatory Visit
Admission: RE | Admit: 2017-10-16 | Discharge: 2017-10-16 | Disposition: A | Discharge status: Home / Self Care | Payer: Medicare Other | Source: Ambulatory Visit | Attending: Gastroenterology | Admitting: Gastroenterology

## 2017-10-16 DIAGNOSIS — R1310 Dysphagia, unspecified: Secondary | ICD-10-CM | POA: Insufficient documentation

## 2017-10-16 DIAGNOSIS — R4702 Dysphasia: Secondary | ICD-10-CM

## 2017-10-16 DIAGNOSIS — Z539 Procedure and treatment not carried out, unspecified reason: Secondary | ICD-10-CM | POA: Insufficient documentation

## 2017-10-16 HISTORY — DX: Type 2 diabetes mellitus without complications: E11.9

## 2017-10-16 LAB — GLUCOSE, CAPILLARY: Glucose-Capillary: 167 mg/dL — ABNORMAL HIGH (ref 70–99)

## 2017-10-16 SURGERY — ESOPHAGOGASTRODUODENOSCOPY (EGD) WITH PROPOFOL
Anesthesia: General

## 2017-10-16 MED ORDER — SODIUM CHLORIDE 0.9 % IV SOLN: INTRAVENOUS | Status: DC

## 2017-10-17 ENCOUNTER — Other Ambulatory Visit: Payer: Self-pay

## 2017-10-17 DIAGNOSIS — R4702 Dysphasia: Secondary | ICD-10-CM

## 2017-10-18 ENCOUNTER — Encounter: Payer: Self-pay | Admitting: *Deleted

## 2017-10-18 ENCOUNTER — Other Ambulatory Visit: Payer: Self-pay

## 2017-10-19 NOTE — Discharge Instructions (Signed)
General Anesthesia, Adult, Care After °These instructions provide you with information about caring for yourself after your procedure. Your health care provider may also give you more specific instructions. Your treatment has been planned according to current medical practices, but problems sometimes occur. Call your health care provider if you have any problems or questions after your procedure. °What can I expect after the procedure? °After the procedure, it is common to have: °· Vomiting. °· A sore throat. °· Mental slowness. ° °It is common to feel: °· Nauseous. °· Cold or shivery. °· Sleepy. °· Tired. °· Sore or achy, even in parts of your body where you did not have surgery. ° °Follow these instructions at home: °For at least 24 hours after the procedure: °· Do not: °? Participate in activities where you could fall or become injured. °? Drive. °? Use heavy machinery. °? Drink alcohol. °? Take sleeping pills or medicines that cause drowsiness. °? Make important decisions or sign legal documents. °? Take care of children on your own. °· Rest. °Eating and drinking °· If you vomit, drink water, juice, or soup when you can drink without vomiting. °· Drink enough fluid to keep your urine clear or pale yellow. °· Make sure you have little or no nausea before eating solid foods. °· Follow the diet recommended by your health care provider. °General instructions °· Have a responsible adult stay with you until you are awake and alert. °· Return to your normal activities as told by your health care provider. Ask your health care provider what activities are safe for you. °· Take over-the-counter and prescription medicines only as told by your health care provider. °· If you smoke, do not smoke without supervision. °· Keep all follow-up visits as told by your health care provider. This is important. °Contact a health care provider if: °· You continue to have nausea or vomiting at home, and medicines are not helpful. °· You  cannot drink fluids or start eating again. °· You cannot urinate after 8-12 hours. °· You develop a skin rash. °· You have fever. °· You have increasing redness at the site of your procedure. °Get help right away if: °· You have difficulty breathing. °· You have chest pain. °· You have unexpected bleeding. °· You feel that you are having a life-threatening or urgent problem. °This information is not intended to replace advice given to you by your health care provider. Make sure you discuss any questions you have with your health care provider. °Document Released: 03/27/2000 Document Revised: 05/24/2015 Document Reviewed: 12/03/2014 °Elsevier Interactive Patient Education © 2018 Elsevier Inc. ° °

## 2017-10-22 ENCOUNTER — Ambulatory Visit: Payer: Medicare Other | Admitting: Anesthesiology

## 2017-10-22 ENCOUNTER — Encounter
Admission: RE | Disposition: A | Discharge status: Home / Self Care | Payer: Self-pay | Source: Ambulatory Visit | Attending: Gastroenterology

## 2017-10-22 ENCOUNTER — Ambulatory Visit
Admission: RE | Admit: 2017-10-22 | Discharge: 2017-10-22 | Disposition: A | Discharge status: Home / Self Care | Payer: Medicare Other | Source: Ambulatory Visit | Attending: Gastroenterology | Admitting: Gastroenterology

## 2017-10-22 DIAGNOSIS — K222 Esophageal obstruction: Secondary | ICD-10-CM | POA: Diagnosis not present

## 2017-10-22 DIAGNOSIS — I10 Essential (primary) hypertension: Secondary | ICD-10-CM | POA: Insufficient documentation

## 2017-10-22 DIAGNOSIS — R4702 Dysphasia: Secondary | ICD-10-CM | POA: Diagnosis not present

## 2017-10-22 DIAGNOSIS — Z87891 Personal history of nicotine dependence: Secondary | ICD-10-CM | POA: Diagnosis not present

## 2017-10-22 DIAGNOSIS — R1319 Other dysphagia: Secondary | ICD-10-CM | POA: Diagnosis not present

## 2017-10-22 DIAGNOSIS — K209 Esophagitis, unspecified without bleeding: Secondary | ICD-10-CM

## 2017-10-22 DIAGNOSIS — K210 Gastro-esophageal reflux disease with esophagitis: Secondary | ICD-10-CM | POA: Insufficient documentation

## 2017-10-22 DIAGNOSIS — M109 Gout, unspecified: Secondary | ICD-10-CM | POA: Insufficient documentation

## 2017-10-22 DIAGNOSIS — Z7984 Long term (current) use of oral hypoglycemic drugs: Secondary | ICD-10-CM | POA: Diagnosis not present

## 2017-10-22 DIAGNOSIS — Z79899 Other long term (current) drug therapy: Secondary | ICD-10-CM | POA: Diagnosis not present

## 2017-10-22 DIAGNOSIS — K2210 Ulcer of esophagus without bleeding: Secondary | ICD-10-CM | POA: Diagnosis not present

## 2017-10-22 DIAGNOSIS — K228 Other specified diseases of esophagus: Secondary | ICD-10-CM | POA: Diagnosis not present

## 2017-10-22 DIAGNOSIS — R1310 Dysphagia, unspecified: Secondary | ICD-10-CM | POA: Diagnosis not present

## 2017-10-22 DIAGNOSIS — Z7982 Long term (current) use of aspirin: Secondary | ICD-10-CM | POA: Insufficient documentation

## 2017-10-22 HISTORY — PX: BALLOON DILATION: SHX5330

## 2017-10-22 HISTORY — DX: Sleep apnea, unspecified: G47.30

## 2017-10-22 HISTORY — PX: ESOPHAGOGASTRODUODENOSCOPY (EGD) WITH PROPOFOL: SHX5813

## 2017-10-22 HISTORY — DX: Presence of dental prosthetic device (complete) (partial): Z97.2

## 2017-10-22 LAB — GLUCOSE, CAPILLARY
GLUCOSE-CAPILLARY: 205 mg/dL — AB (ref 70–99)
GLUCOSE-CAPILLARY: 208 mg/dL — AB (ref 70–99)

## 2017-10-22 SURGERY — ESOPHAGOGASTRODUODENOSCOPY (EGD) WITH PROPOFOL
Anesthesia: General | Site: Throat

## 2017-10-22 MED ORDER — LIDOCAINE HCL (CARDIAC) PF 100 MG/5ML IV SOSY
PREFILLED_SYRINGE | INTRAVENOUS | Status: DC | PRN
  Administered 2017-10-22: 30 mg via INTRAVENOUS

## 2017-10-22 MED ORDER — PANTOPRAZOLE SODIUM 40 MG PO TBEC: 40.0000 mg | DELAYED_RELEASE_TABLET | Freq: Every day | ORAL | 11 refills | Status: DC

## 2017-10-22 MED ORDER — PROPOFOL 10 MG/ML IV BOLUS
INTRAVENOUS | Status: DC | PRN
  Administered 2017-10-22: 100 mg via INTRAVENOUS
  Administered 2017-10-22: 50 mg via INTRAVENOUS

## 2017-10-22 MED ORDER — LACTATED RINGERS IV SOLN
INTRAVENOUS | Status: DC | PRN
  Administered 2017-10-22: 10:00:00 via INTRAVENOUS

## 2017-10-22 SURGICAL SUPPLY — 10 items
BALLN DILATOR 15-18 8 (BALLOONS) ×3
BALLOON DILATOR 15-18 8 (BALLOONS) ×1 IMPLANT
BLOCK BITE 60FR ADLT L/F GRN (MISCELLANEOUS) ×3 IMPLANT
CANISTER SUCT 1200ML W/VALVE (MISCELLANEOUS) ×3 IMPLANT
FORCEPS BIOP RAD 4 LRG CAP 4 (CUTTING FORCEPS) ×3 IMPLANT
GOWN CVR UNV OPN BCK APRN NK (MISCELLANEOUS) ×2 IMPLANT
GOWN ISOL THUMB LOOP REG UNIV (MISCELLANEOUS) ×4
KIT ENDO PROCEDURE OLY (KITS) ×3 IMPLANT
SYR INFLATION 60ML (SYRINGE) ×3 IMPLANT
WATER STERILE IRR 250ML POUR (IV SOLUTION) ×3 IMPLANT

## 2017-10-22 NOTE — Transfer of Care (Signed)
Immediate Anesthesia Transfer of Care Note  [name]  Procedure(s) Performed: ESOPHAGOGASTRODUODENOSCOPY (EGD) WITH Biopsy (N/A Throat) BALLOON DILATION (N/A Throat)  Patient Location: PACU  Anesthesia Type: General  Level of Consciousness: awake, alert  and patient cooperative  Airway and Oxygen Therapy: Patient Spontanous Breathing and Patient connected to supplemental oxygen  Post-op Assessment: Post-op Vital signs reviewed, Patient's Cardiovascular Status Stable, Respiratory Function Stable, Patent Airway and No signs of Nausea or vomiting  Post-op Vital Signs: Reviewed and stable  Complications: No apparent anesthesia complications

## 2017-10-22 NOTE — Anesthesia Postprocedure Evaluation (Signed)
Anesthesia Post Note  [name]  Procedure(s) Performed: ESOPHAGOGASTRODUODENOSCOPY (EGD) WITH Biopsy (N/A Throat) BALLOON DILATION (N/A Throat)  Patient location during evaluation: PACU Anesthesia Type: General Level of consciousness: awake Pain management: pain level controlled Vital Signs Assessment: post-procedure vital signs reviewed and stable Respiratory status: respiratory function stable Cardiovascular status: stable Postop Assessment: no signs of nausea or vomiting Anesthetic complications: no    Veda Canning

## 2017-10-22 NOTE — Anesthesia Procedure Notes (Signed)
Performed by: Raeley Gilmore, CRNA Pre-anesthesia Checklist: Patient identified, Emergency Drugs available, Suction available, Timeout performed and Patient being monitored Patient Re-evaluated:Patient Re-evaluated prior to induction Oxygen Delivery Method: Nasal cannula Placement Confirmation: positive ETCO2       

## 2017-10-22 NOTE — Anesthesia Preprocedure Evaluation (Signed)
Anesthesia Evaluation  Patient identified by MRN, date of birth, ID band Patient awake    Reviewed: Allergy & Precautions, NPO status , Patient's Chart, lab work & pertinent test results  Airway Mallampati: II  TM Distance: >3 FB    Comment: Large beard Dental  (+) Upper Dentures, Lower Dentures   Pulmonary sleep apnea (does not use CPAP) , former smoker,    breath sounds clear to auscultation       Cardiovascular hypertension,  Rhythm:Regular Rate:Normal     Neuro/Psych Anxiety Depression    GI/Hepatic   Endo/Other  diabetesBMI 30  Renal/GU      Musculoskeletal Gout   Abdominal   Peds  Hematology   Anesthesia Other Findings   Reproductive/Obstetrics                            Anesthesia Physical Anesthesia Plan  ASA: III  Anesthesia Plan: General   Post-op Pain Management:    Induction: Intravenous  PONV Risk Score and Plan:   Airway Management Planned: Natural Airway and Simple Face Mask  Additional Equipment:   Intra-op Plan:   Post-operative Plan:   Informed Consent: I have reviewed the patients History and Physical, chart, labs and discussed the procedure including the risks, benefits and alternatives for the proposed anesthesia with the patient or authorized representative who has indicated his/her understanding and acceptance.     Plan Discussed with: CRNA  Anesthesia Plan Comments:         Anesthesia Quick Evaluation

## 2017-10-22 NOTE — Op Note (Addendum)
[facility] Gastroenterology Patient Name: Shane Jones Procedure Date: 10/22/2017 10:20 AM MRN: [id number] Account #: [id number] Date of Birth: [birth year] Admit Type: Outpatient Age: 72 Room: [facility] OR ROOM 01 Gender: Male Note Status: Finalized Procedure:            Upper GI endoscopy Indications:          Dysphagia Providers:            Lucilla Lame MD, MD Referring MD:         Angela Adam. Caryl Bis (Referring MD) Medicines:            Propofol per Anesthesia Complications:        No immediate complications. Procedure:            Pre-Anesthesia Assessment:                       - Prior to the procedure, a History and Physical was                        performed, and patient medications and allergies were                        reviewed. The patient's tolerance of previous                        anesthesia was also reviewed. The risks and benefits of                        the procedure and the sedation options and risks were                        discussed with the patient. All questions were                        answered, and informed consent was obtained. Prior                        Anticoagulants: The patient has taken no previous                        anticoagulant or antiplatelet agents. ASA Grade                        Assessment: II - A patient with mild systemic disease.                        After reviewing the risks and benefits, the patient was                        deemed in satisfactory condition to undergo the                        procedure.                       After obtaining informed consent, the endoscope was                        passed under direct vision. Throughout the procedure,  the patient's blood pressure, pulse, and oxygen                        saturations were monitored continuously. The was                        introduced through the mouth, and advanced to the                        second part of  duodenum. The upper GI endoscopy was                        accomplished without difficulty. The patient tolerated                        the procedure well. Findings:      One cratered esophageal ulcer with no bleeding and no stigmata of recent       bleeding was found.      LA Grade B (one or more mucosal breaks greater than 5 mm, not extending       between the tops of two mucosal folds) esophagitis with no bleeding was       found in the lower third of the esophagus.      One benign-appearing, intrinsic mild stenosis was found in the lower       third of the esophagus. The stenosis was traversed. A TTS dilator was       passed through the scope. Dilation with a 15-16.5-18 mm balloon dilator       was performed to 18 mm. The dilation site was examined following       endoscope reinsertion and showed complete resolution of luminal       narrowing.      Two biopsies were obtained with cold forceps for histology in the middle       third of the esophagus.      The stomach was normal.      The examined duodenum was normal. Impression:           - Non-bleeding esophageal ulcer.                       - LA Grade B reflux esophagitis.                       - Benign-appearing esophageal stenosis. Dilated.                       - Normal stomach.                       - Normal examined duodenum.                       - Biopsy performed in the middle third of the esophagus. Recommendation:       - Use a proton pump inhibitor PO daily. Lucilla Lame MD, MD 10/22/2017 10:36:37 AM This report has been signed electronically. Number of Addenda: 0 Note Initiated On: 10/22/2017 10:20 AM      [facility]

## 2017-10-22 NOTE — Interval H&P Note (Signed)
History and Physical Interval Note:  10/22/2017 9:10 AM  Shane Jones  has presented today for surgery, with the diagnosis of dysphagia  The various methods of treatment have been discussed with the patient and family. After consideration of risks, benefits and other options for treatment, the patient has consented to  Procedure(s): ESOPHAGOGASTRODUODENOSCOPY (EGD) WITH PROPOFOL (N/A) as a surgical intervention .  The patient's history has been reviewed, patient examined, no change in status, stable for surgery.  I have reviewed the patient's chart and labs.  Questions were answered to the patient's satisfaction.     Shane Jones [employer]

## 2017-10-23 ENCOUNTER — Encounter: Payer: Self-pay | Admitting: Gastroenterology

## 2017-10-24 ENCOUNTER — Encounter: Payer: Self-pay | Admitting: Gastroenterology

## 2017-10-24 ENCOUNTER — Ambulatory Visit: Payer: Medicare Other | Admitting: Family Medicine

## 2017-11-05 ENCOUNTER — Other Ambulatory Visit: Payer: Self-pay | Admitting: Family Medicine

## 2017-11-19 ENCOUNTER — Other Ambulatory Visit: Payer: Self-pay | Admitting: Family Medicine

## 2017-11-19 ENCOUNTER — Ambulatory Visit: Payer: Medicare Other | Admitting: Pharmacist

## 2017-11-19 ENCOUNTER — Telehealth: Payer: Self-pay

## 2017-11-19 NOTE — Telephone Encounter (Signed)
Thanks! Pt rescheduled 12/03/17 10am.

## 2017-11-19 NOTE — Telephone Encounter (Signed)
Copied from [place] [id number]. Topic: General - Other >> Nov 19, 2017 10:12 AM Rutherford Nail, NT wrote: Reason for CRM: Patient calling and states that he just got in from work and would like a phone call from [employer]. Please advise. Would like to delay the appointment for 1 week. CB#: [phone]

## 2017-11-19 NOTE — Telephone Encounter (Signed)
OK to reschedule with me in the next 2 weeks, pending patient's availability. Thanks!  Catie Darnelle Maffucci, PharmD PGY2 Ambulatory Care Pharmacy Resident, [place] [facility] Phone: [phone]

## 2017-11-22 ENCOUNTER — Telehealth: Payer: Self-pay | Admitting: Family Medicine

## 2017-11-22 DIAGNOSIS — G4733 Obstructive sleep apnea (adult) (pediatric): Secondary | ICD-10-CM

## 2017-11-22 NOTE — Telephone Encounter (Signed)
PT is requesting a RX for autotitration study for 2 weeks, 4-20cm. Phone number is [phone]. Pt's cpap machine needs to be adjusted.

## 2017-11-22 NOTE — Telephone Encounter (Signed)
Order placed for cpap titration study. Sending to [facility] to get this scheduled.

## 2017-12-03 ENCOUNTER — Encounter: Payer: Self-pay | Admitting: Pharmacist

## 2017-12-03 ENCOUNTER — Ambulatory Visit (INDEPENDENT_AMBULATORY_CARE_PROVIDER_SITE_OTHER): Payer: Medicare Other | Admitting: Pharmacist

## 2017-12-03 VITALS — BP 144/90 | HR 65 | Wt 178.0 lb

## 2017-12-03 DIAGNOSIS — E1140 Type 2 diabetes mellitus with diabetic neuropathy, unspecified: Secondary | ICD-10-CM

## 2017-12-03 DIAGNOSIS — IMO0002 Reserved for concepts with insufficient information to code with codable children: Secondary | ICD-10-CM

## 2017-12-03 DIAGNOSIS — E1165 Type 2 diabetes mellitus with hyperglycemia: Secondary | ICD-10-CM

## 2017-12-03 DIAGNOSIS — R809 Proteinuria, unspecified: Secondary | ICD-10-CM | POA: Diagnosis not present

## 2017-12-03 LAB — POCT URINALYSIS DIPSTICK
Bilirubin, UA: NEGATIVE
Glucose, UA: POSITIVE — AB
Ketones, UA: NEGATIVE
Leukocytes, UA: NEGATIVE
NITRITE UA: NEGATIVE
PH UA: 5.5 (ref 5.0–8.0)
PROTEIN UA: POSITIVE — AB
Spec Grav, UA: 1.01 (ref 1.010–1.025)
Urobilinogen, UA: 0.2 E.U./dL

## 2017-12-03 LAB — URINALYSIS, MICROSCOPIC ONLY: RBC / HPF: NONE SEEN (ref 0–?)

## 2017-12-03 NOTE — Assessment & Plan Note (Signed)
#?   Hematuria  - Per Dr. Caryl Bis, will check POCT urine dipstick + urine microscopic; patient is amenable to nephrology and urology consult as needed.

## 2017-12-03 NOTE — Progress Notes (Signed)
S:     Chief Complaint  Patient presents with  . Medication Management    Diabetes    Patient arrives in good spirits, ambulating without assistance.  Presents for hypertension and diabetes evaluation, education, and management at the request of Dr. Caryl Bis (referred on 09/19/2017). Last seen by primary care provider on 10/10/2017. Last seen in Pharmacy clinic on 10/01/2017. No changes were made at that time due to medication nonadherence.  He notes that his EGD procedure went well, and he has not had any issues with swallowing/choking since then. He was off his medications for ~1 week before, but has been back on them for ~2-3 weeks now. He denies intolerance concerns with his medications. 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 He denies having seen any blood in his urine.    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  Insurance coverage/medication affordability: [employer] [place]; Receives Trulicity and Jardiance from patient assistance  Patient reports adherence with medications.  Current diabetes medications include: metformin 4970 mg BID, Trulicity 1.5 mg weekly, Jardiance 25 mg daily Current hypertension medications include:  losartan 100 mg daily, furosemide 20 mg daily, metoprolol succinate 100 mg daily, amlodipine 10 mg daily (takes all medications together in the morning)  Patient denies hypoglycemic s/sx including dizziness, shakiness, sweating. Patient reports polyuria, polydipsia; nocturia, though notes that he drinks lots of water during the day.   O:  Physical Exam  Constitutional: He appears well-developed and well-nourished.  Vitals reviewed.   Review of Systems  All other systems reviewed and are negative.  Lab Results  Component Value Date   HGBA1C 8.4 (A) 10/01/2017   Vitals:     12/03/17 1035 12/03/17 1056  BP: (!) 156/101 (!) 144/90  Pulse:  65  Patient did NOT taken antihypertensives this morning before appointment  Basic Metabolic Panel BMP Latest Ref Rng & Units 03/19/2017 10/30/2016 10/16/2016  Glucose 70 - 99 mg/dL 146(H) 250(H) -  BUN 6 - 23 mg/dL 25(H) 13 -  Creatinine 0.40 - 1.50 mg/dL 1.44 1.21 -  Sodium 135 - 145 mEq/L 145 143 -  Potassium 3.5 - 5.1 mEq/L 4.3 3.8 3.3(L)  Chloride 96 - 112 mEq/L 102 104 -  CO2 19 - 32 mEq/L 31 31 -  Calcium 8.4 - 10.5 mg/dL 10.4 9.5 -     Lipid Panel     Component Value Date/Time   CHOL 117 08/07/2016 1156   TRIG 316 (H) 08/07/2016 1156   HDL 31 (L) 08/07/2016 1156   CHOLHDL 3.8 08/07/2016 1156   VLDL 63 (H) 08/07/2016 1156   LDLCALC 23 08/07/2016 1156   LDLDIRECT 51.0 03/19/2017 0845   Random readings: 130-160s Reports "fastings" <120; post meals 150-160s  Clinical ASCVD: No  ASCVD risk factors: age 64-75 10 Year ASCVD Risk: <20%  A/P: Following discussion and approval by Dr. Caryl Bis, the following medication changes were made:   #Diabetes - Currently uncontrolled, most recent A1c 8.4% 10/01/2017, worsened from 8.0% on 06/2017; Goal A1c <7%. Appropriate to continue current regimen and focus on adherence, lifestyle modifications - Continue Trulicity 1.5 mg weekly, Jardiance 25 mg daily, metformin 1000 mg BID. Started process to reapply for Jardiance and Trulicity patient assistance for 2020.  - Encouraged focusing on portion  sizes and increasing physical activity over the next month  #Hypertension currently uncontrolled. Goal BP <140/90. Patient reports adherence with medications typically, but had not taken his medications before coming to clinic today.  - Continue losartan 100 mg daily, furosemide 20 mg daily, metoprolol succinate 100 mg daily, amlodipine 10 mg daily  #? Hematuria  - Per Dr. Caryl Bis, will check POCT urine dipstick + urine microscopic; patient is amenable to nephrology and  urology consult as needed.   Written patient instructions provided.  Total time in face to face counseling 40 minutes.    Follow up in Pharmacist Clinic Visit 4 weeks after PCP visit in January  Shaena Parkerson E Lainey Nelson, PharmD PGY2 Ambulatory Care Pharmacy Resident Phone: [phone]

## 2017-12-03 NOTE — Assessment & Plan Note (Signed)
#  Diabetes - Currently uncontrolled, most recent A1c 8.4% 10/01/2017, worsened from 8.0% on 06/2017; Goal A1c <7%. Appropriate to continue current regimen and focus on adherence, lifestyle modifications - Continue Trulicity 1.5 mg weekly, Jardiance 25 mg daily, metformin 1000 mg BID. Started process to reapply for Jardiance and Trulicity patient assistance for 2020.  - Encouraged focusing on portion sizes and increasing physical activity over the next month

## 2017-12-03 NOTE — Patient Instructions (Signed)
It was great to see you today! Keep up the great working taking your medications daily (or weekly for the injection) and checking your blood sugars.   We won't make any changes today. Continue taking metformin 1000 mg twice daily, Jardiance 25 mg once daily, and Trulicity 1.5 mg once weekly.   Keep checking your blood sugars. If you check when you haven't eaten, we want to see readings less than 130. If you check about 2 hours after eating, we want to see numbers less than 180.    See Dr. Caryl Bis in January. Schedule follow up with me at the end of February.    Catie Darnelle Maffucci, PharmD

## 2017-12-04 NOTE — Progress Notes (Signed)
I have reviewed the above note and agree. I saw the patient with the clinical pharmacist.  Urinalysis collected.  I will forward to [place] to contact the patient to see who his DOT physician is so we can request the urinalysis results that were done by them to determine if a urine microscopy was done.  [name], MD

## 2017-12-06 ENCOUNTER — Other Ambulatory Visit: Payer: Self-pay | Admitting: Family Medicine

## 2017-12-06 DIAGNOSIS — R809 Proteinuria, unspecified: Secondary | ICD-10-CM

## 2018-01-07 ENCOUNTER — Other Ambulatory Visit: Payer: Self-pay | Admitting: Family Medicine

## 2018-01-07 ENCOUNTER — Telehealth: Payer: Self-pay | Admitting: Family Medicine

## 2018-01-07 ENCOUNTER — Telehealth: Payer: Self-pay | Admitting: Pharmacist

## 2018-01-07 DIAGNOSIS — G4733 Obstructive sleep apnea (adult) (pediatric): Secondary | ICD-10-CM

## 2018-01-07 NOTE — Telephone Encounter (Signed)
Contacted [employer] - patient has been approved for Jardiance patient assistance through 01/02/2019.   [place] - they are still processing [id number] reapplication for Trulicity. Recommended I follow up in the next 3-5 business days.   Contacted patient, informed of this information. He expressed appreciation.   Plan - Will f/u with [employer] in 3-5 business days regarding application  Catie Darnelle Maffucci, PharmD PGY2 Ambulatory Care Pharmacy Resident, [place] Phone: [phone]

## 2018-01-07 NOTE — Telephone Encounter (Signed)
I have ordered a home sleep study for this patient.  Please contact him to see if he would be willing to do this.

## 2018-01-11 NOTE — Telephone Encounter (Signed)
He is willing. Order has been sent to [facility].

## 2018-01-14 ENCOUNTER — Telehealth: Payer: Self-pay | Admitting: Pharmacist

## 2018-01-14 NOTE — Telephone Encounter (Signed)
Called and spoke with patient. Pt advised and voiced understanding will be coming today to pick up.

## 2018-01-14 NOTE — Telephone Encounter (Signed)
[name] samples received in the office; label is appropriate.   Shane Jones- could you call Shane Jones and let him know the Trulicity was approved and is ready for pick up? Thanks!  Catie Darnelle Maffucci, PharmD PGY2 Ambulatory Care Pharmacy Resident, [place] [facility] Phone: [phone]

## 2018-01-14 NOTE — Telephone Encounter (Signed)
Called patient and left a detailed VM that samples are ready for pick up.

## 2018-01-21 ENCOUNTER — Encounter: Payer: Self-pay | Admitting: Family Medicine

## 2018-01-21 ENCOUNTER — Ambulatory Visit (INDEPENDENT_AMBULATORY_CARE_PROVIDER_SITE_OTHER): Payer: Medicare Other | Admitting: Family Medicine

## 2018-01-21 VITALS — BP 122/88 | HR 71 | Temp 97.8°F | Wt 177.6 lb

## 2018-01-21 DIAGNOSIS — R809 Proteinuria, unspecified: Secondary | ICD-10-CM | POA: Diagnosis not present

## 2018-01-21 DIAGNOSIS — L989 Disorder of the skin and subcutaneous tissue, unspecified: Secondary | ICD-10-CM | POA: Diagnosis not present

## 2018-01-21 DIAGNOSIS — IMO0002 Reserved for concepts with insufficient information to code with codable children: Secondary | ICD-10-CM

## 2018-01-21 DIAGNOSIS — I10 Essential (primary) hypertension: Secondary | ICD-10-CM | POA: Diagnosis not present

## 2018-01-21 DIAGNOSIS — E1140 Type 2 diabetes mellitus with diabetic neuropathy, unspecified: Secondary | ICD-10-CM

## 2018-01-21 DIAGNOSIS — E785 Hyperlipidemia, unspecified: Secondary | ICD-10-CM

## 2018-01-21 DIAGNOSIS — Z1159 Encounter for screening for other viral diseases: Secondary | ICD-10-CM

## 2018-01-21 DIAGNOSIS — E1165 Type 2 diabetes mellitus with hyperglycemia: Secondary | ICD-10-CM

## 2018-01-21 DIAGNOSIS — G4730 Sleep apnea, unspecified: Secondary | ICD-10-CM

## 2018-01-21 DIAGNOSIS — R197 Diarrhea, unspecified: Secondary | ICD-10-CM | POA: Diagnosis not present

## 2018-01-21 LAB — LIPID PANEL
CHOL/HDL RATIO: 3
Cholesterol: 85 mg/dL (ref 0–200)
HDL: 30.1 mg/dL — ABNORMAL LOW (ref >39.00)
NONHDL: 54.79
Triglycerides: 204 mg/dL — ABNORMAL HIGH (ref 0.0–149.0)
VLDL: 40.8 mg/dL — ABNORMAL HIGH (ref 0.0–40.0)

## 2018-01-21 LAB — COMPREHENSIVE METABOLIC PANEL
ALT: 19 U/L (ref 0–53)
AST: 21 U/L (ref 0–37)
Albumin: 4.4 g/dL (ref 3.5–5.2)
Alkaline Phosphatase: 82 U/L (ref 39–117)
BUN: 17 mg/dL (ref 6–23)
CHLORIDE: 104 meq/L (ref 96–112)
CO2: 30 meq/L (ref 19–32)
Calcium: 9.9 mg/dL (ref 8.4–10.5)
Creatinine, Ser: 1.44 mg/dL (ref 0.40–1.50)
GFR: 47.85 mL/min — ABNORMAL LOW (ref >60.00)
GLUCOSE: 148 mg/dL — AB (ref 70–99)
Potassium: 4.1 mEq/L (ref 3.5–5.1)
SODIUM: 144 meq/L (ref 135–145)
TOTAL PROTEIN: 7.2 g/dL (ref 6.0–8.3)
Total Bilirubin: 0.7 mg/dL (ref 0.2–1.2)

## 2018-01-21 LAB — HEMOGLOBIN A1C: HEMOGLOBIN A1C: 7.9 % — AB (ref 4.6–6.5)

## 2018-01-21 LAB — LDL CHOLESTEROL, DIRECT: Direct LDL: 35 mg/dL

## 2018-01-21 NOTE — Assessment & Plan Note (Signed)
Check A1c.  Continue current medication. °

## 2018-01-21 NOTE — Assessment & Plan Note (Signed)
I suspect this is related to his diabetes though he reports proteinuria going back to when he was younger.  He will keep his appointment with nephrology.  Check CMP.

## 2018-01-21 NOTE — Assessment & Plan Note (Signed)
Check lipid panel.  Continue Lipitor. 

## 2018-01-21 NOTE — Patient Instructions (Signed)
Nice to see you. We will check lab work today. We will have you see dermatology. Please continue to monitor your blood pressure and blood sugars. [facility] for your home sleep study.

## 2018-01-21 NOTE — Assessment & Plan Note (Signed)
Appears to be adequately controlled the majority of time.  He will continue to monitor and continue his current regimen.

## 2018-01-21 NOTE — Progress Notes (Signed)
Shane Rumps, MD Phone: [phone]  Shane Jones is a 73 y.o. male who presents today for f/u.  CC: Diabetes, OSA, proteinuria, hypertension, diarrhea  Diabetes: 141-196.  Taking Jardiance, Trulicity, and metformin.  No changes to his urinary habits.  No increased thirst.  No hypoglycemia.  OSA: He was supposed to complete a sleep study.  He received a call though he was driving and they did not call him back.  Proteinuria: He is due to see nephrology later this month.  Hypertension: Typically 117-122/69-80s.  Occasionally into the low 53G diastolically.  Occasionally up to 644 systolically.  Taking amlodipine, losartan, metoprolol, and Lasix.  No chest pain, shortness of breath, or edema.  Diarrhea: Patient notes this is been going on intermittently for the last 2 weeks.  He describes "pins-and-needles" in his stomach with this.  No blood in his stool.  No fevers.  His abdomen is a little sore though no significant pain.  Some nausea.  He does not feel ill.  No vomiting.  He notes this has been improving over the last day or 2.  Skin lesions: Numerous skin lesions noted on his arms bilaterally.  They appear to be seborrheic keratoses.  He has not followed with dermatology anytime recently.  Social History   Tobacco Use  Smoking Status Former Smoker  . Last attempt to quit: 11/20/1978  . Years since quitting: 39.1  Smokeless Tobacco Never Used     ROS see history of present illness  Objective  Physical Exam Vitals:   01/21/18 1350  BP: 122/88  Pulse: 71  Temp: 97.8 F (36.6 C)  SpO2: 96%    BP Readings from Last 3 Encounters:  01/21/18 122/88  12/03/17 (!) 144/90  10/22/17 121/89   Wt Readings from Last 3 Encounters:  01/21/18 177 lb 9.6 oz (80.6 kg)  12/03/17 178 lb (80.7 kg)  10/22/17 175 lb (79.4 kg)    Physical Exam Constitutional:      General: He is not in acute distress.    Appearance: He is not diaphoretic.  Cardiovascular:     Rate and  Rhythm: Normal rate and regular rhythm.     Heart sounds: Normal heart sounds.  Pulmonary:     Effort: Pulmonary effort is normal.     Breath sounds: Normal breath sounds.  Abdominal:     General: Bowel sounds are normal. There is no distension.     Palpations: Abdomen is soft.     Tenderness: There is no abdominal tenderness. There is no guarding or rebound.  Skin:    General: Skin is warm and dry.     Comments: Scattered seborrheic keratoses on bilateral forearms  Neurological:     Mental Status: He is alert.      Assessment/Plan: Please see individual problem list.  Hypertension Appears to be adequately controlled the majority of time.  He will continue to monitor and continue his current regimen.  Sleep apnea Patient was given the number to contact [facility] to have home sleep study completed.  Type 2 diabetes, uncontrolled, with neuropathy (HCC) Check A1c.  Continue current medication.  Hyperlipidemia Check lipid panel.  Continue Lipitor.  Proteinuria I suspect this is related to his diabetes though he reports proteinuria going back to when he was younger.  He will keep his appointment with nephrology.  Check CMP.  Diarrhea Undetermined cause though it has been improving.  If it does not continue to improve he will let us know.  Skin lesions  These appear to be seborrheic keratoses.  Will refer back to dermatology for full skin exam.   Health Maintenance: Hep C testing ordered.  Orders Placed This Encounter  Procedures  . Comp Met (CMET)  . Lipid panel  . HgB A1c  . Hepatitis C Antibody  . Ambulatory referral to Dermatology    Referral Priority:   Routine    Referral Type:   Consultation    Referral Reason:   Specialty Services Required    Requested Specialty:   Dermatology    Number of Visits Requested:   1    No orders of the defined types were placed in this encounter.    Shane Rumps, MD [place]

## 2018-01-21 NOTE — Assessment & Plan Note (Signed)
Patient was given the number to contact [facility] to have home sleep study completed.

## 2018-01-21 NOTE — Assessment & Plan Note (Signed)
Undetermined cause though it has been improving.  If it does not continue to improve he will let us know.

## 2018-01-21 NOTE — Assessment & Plan Note (Signed)
These appear to be seborrheic keratoses.  Will refer back to dermatology for full skin exam.

## 2018-01-22 LAB — HEPATITIS C ANTIBODY
Hepatitis C Ab: NONREACTIVE
SIGNAL TO CUT-OFF: 0.25 (ref <1.00)

## 2018-01-23 ENCOUNTER — Telehealth: Payer: Self-pay | Admitting: Gastroenterology

## 2018-01-23 NOTE — Telephone Encounter (Signed)
Patient called and wanted to thank Dr Allen Norris and his staff for seeing and helping him. He hasn't had anymore trouble since.

## 2018-01-24 ENCOUNTER — Telehealth: Payer: Self-pay

## 2018-01-24 NOTE — Telephone Encounter (Signed)
Copied from [place]. Topic: General - Other >> Jan 23, 2018 12:14 PM Percell Belt A wrote: Reason for CRM:  Pt called in and stated he was going to stop but the office today to pick up a copy of his A1C results.  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 LM notifying patient.**

## 2018-01-30 ENCOUNTER — Other Ambulatory Visit: Payer: Self-pay | Admitting: Family Medicine

## 2018-01-30 NOTE — Telephone Encounter (Signed)
Requested medication (s) are due for refill today yes  Requested medication (s) are on the active medication list -yes  Future visit scheduled yes  Last refill: gabapentin- last filled by provider no longer at practice- 09/06/16                  K-dur- 11/19/17  Notes to clinic: Patient was recently seen- he is requesting refill of medication that is - outside provider and potasium was not refilled at appointment- sent for review. Patient is requesting 90 days supply to mail order and short term supply to local pharmacy  Requested Prescriptions  Pending Prescriptions Disp Refills   gabapentin (NEURONTIN) 400 MG capsule 270 capsule 3    Sig: Take 1 capsule (400 mg total) by mouth 3 (three) times daily.     Neurology: Anticonvulsants - gabapentin Passed - 01/30/2018  2:54 PM      Passed - Valid encounter within last 12 months    Recent Outpatient Visits          1 week ago Essential hypertension   [place], Angela Adam, [place]   1 month ago Proteinuria, unspecified type   [place], [place]   3 months ago Proteinuria, unspecified type   [facility], Angela Adam, MD   4 months ago Type 2 diabetes, uncontrolled, with neuropathy [facility])   [facility] De Hollingshead, [place]   5 months ago Type 2 diabetes, uncontrolled, with neuropathy [facility])   [place], Arville Lime, [place]      Future Appointments            In 3 weeks De Hollingshead, [place], [place]   In 3 months Caryl Bis, Angela Adam, MD [facility], [facility]          potassium chloride (K-DUR) 10 MEQ tablet 90 tablet 0     Endocrinology:  Minerals - Potassium Supplementation Passed - 01/30/2018  2:54 PM      Passed - K in normal range and within 360 days    Potassium  Date Value Ref Range Status  01/21/2018 4.1 3.5 - 5.1 mEq/L Final         Passed -  Cr in normal range and within 360 days    Creat  Date Value Ref Range Status  08/07/2016 1.45 (H) 0.70 - 1.18 mg/dL Final    Comment:      For patients > or = 75 years of age: The upper reference limit for Creatinine is approximately 13% higher for people identified as African-American.      Creatinine, Ser  Date Value Ref Range Status  01/21/2018 1.44 0.40 - 1.50 mg/dL Final         Passed - Valid encounter within last 12 months    Recent Outpatient Visits          1 week ago Essential hypertension   [place], Angela Adam, MD   1 month ago Proteinuria, unspecified type   [place], [place]   3 months ago Proteinuria, unspecified type   [facility] Leone Haven, MD   4 months ago Type 2 diabetes, uncontrolled, with neuropathy [facility])   [facility] De [place], [place]   5 months ago Type 2 diabetes, uncontrolled, with neuropathy [facility])   [place], Barnetta Chapel  E, [place]      Future Appointments            In 3 weeks De Hollingshead, [place], [place]   In 3 months Caryl Bis, Angela Adam, MD [facility], [facility]            Requested Prescriptions  Pending Prescriptions Disp Refills   gabapentin (NEURONTIN) 400 MG capsule 270 capsule 3    Sig: Take 1 capsule (400 mg total) by mouth 3 (three) times daily.     Neurology: Anticonvulsants - gabapentin Passed - 01/30/2018  2:54 PM      Passed - Valid encounter within last 12 months    Recent Outpatient Visits          1 week ago Essential hypertension   [place], Angela Adam, MD   1 month ago Proteinuria, unspecified type   [place], [place]   3 months ago Proteinuria, unspecified type   [facility], Angela Adam, MD   4 months ago Type 2 diabetes,  uncontrolled, with neuropathy [facility])   [facility] De [place], [place]   5 months ago Type 2 diabetes, uncontrolled, with neuropathy [facility])   [place], Arville Lime, [place]      Future Appointments            In 3 weeks De Hollingshead, [place], [place]   In 3 months Caryl Bis, Angela Adam, MD [facility], [facility]          potassium chloride (K-DUR) 10 MEQ tablet 90 tablet 0     Endocrinology:  Minerals - Potassium Supplementation Passed - 01/30/2018  2:54 PM      Passed - K in normal range and within 360 days    Potassium  Date Value Ref Range Status  01/21/2018 4.1 3.5 - 5.1 mEq/L Final         Passed - Cr in normal range and within 360 days    Creat  Date Value Ref Range Status  08/07/2016 1.45 (H) 0.70 - 1.18 mg/dL Final    Comment:      For patients > or = 75 years of age: The upper reference limit for Creatinine is approximately 13% higher for people identified as African-American.      Creatinine, Ser  Date Value Ref Range Status  01/21/2018 1.44 0.40 - 1.50 mg/dL Final         Passed - Valid encounter within last 12 months    Recent Outpatient Visits          1 week ago Essential hypertension   [place], Angela Adam, MD   1 month ago Proteinuria, unspecified type   [place], [place]   3 months ago Proteinuria, unspecified type   [facility] Leone Haven, MD   4 months ago Type 2 diabetes, uncontrolled, with neuropathy [facility])   [facility] De [place], [place]   5 months ago Type 2 diabetes, uncontrolled, with neuropathy [facility])   [place], Arville Lime, [facility]      Future Appointments            In 3 weeks De Hollingshead, [place], [place]   In 3 months Caryl Bis, Angela Adam, MD [facility],  [facility]

## 2018-01-30 NOTE — Telephone Encounter (Signed)
Copied from [place] [id number]. Topic: General - Other >> Jan 30, 2018  2:31 PM Oneta Rack wrote: Relation to pt: self  Call back number: [phone] [facility]) Pharmacy: [facility] [place], [place] [phone] (Phone) [phone] (Fax)    Reason for call:  Patient requesting emergency supply of gabapentin (NEURONTIN) 400 MG capsule please send to retail and the remaining supply  to mail order and potassium chloride (K-DUR) 10 MEQ tablet.    Patient states [employer] sent in request for all medications and informed patient to contact PCP office checking on the status. Patient requesting a call from "[place]" when Rx are sent, please advise

## 2018-02-01 ENCOUNTER — Other Ambulatory Visit: Payer: Self-pay | Admitting: Family Medicine

## 2018-02-01 ENCOUNTER — Other Ambulatory Visit: Payer: Self-pay

## 2018-02-01 MED ORDER — GABAPENTIN 400 MG PO CAPS: 400.0000 mg | ORAL_CAPSULE | Freq: Three times a day (TID) | ORAL | 0 refills | Status: DC

## 2018-02-01 NOTE — Telephone Encounter (Signed)
Patient came into the office requesting a refill on all of his medications. He would like the refill request sent to his mail order [facility].

## 2018-02-01 NOTE — Telephone Encounter (Signed)
Patient called and stated he really needs his gabapentin filled today he only has one pill left ok to fill? Last filled by Dr. Lacinda Axon?

## 2018-02-04 MED ORDER — POTASSIUM CHLORIDE ER 10 MEQ PO TBCR: EXTENDED_RELEASE_TABLET | ORAL | 0 refills | Status: DC

## 2018-02-04 MED ORDER — AMLODIPINE BESYLATE 10 MG PO TABS: ORAL_TABLET | ORAL | 2 refills | Status: DC

## 2018-02-04 MED ORDER — FUROSEMIDE 20 MG PO TABS: ORAL_TABLET | ORAL | 0 refills | Status: DC

## 2018-02-04 MED ORDER — ESCITALOPRAM OXALATE 10 MG PO TABS: ORAL_TABLET | ORAL | 0 refills | Status: DC

## 2018-02-04 MED ORDER — METOPROLOL SUCCINATE ER 100 MG PO TB24: ORAL_TABLET | ORAL | 1 refills | Status: DC

## 2018-02-04 MED ORDER — ATORVASTATIN CALCIUM 40 MG PO TABS: ORAL_TABLET | ORAL | 3 refills | Status: DC

## 2018-02-04 MED ORDER — EMPAGLIFLOZIN 25 MG PO TABS: 25.0000 mg | ORAL_TABLET | Freq: Every day | ORAL | 3 refills | Status: DC

## 2018-02-04 MED ORDER — ALLOPURINOL 100 MG PO TABS: ORAL_TABLET | ORAL | Status: DC

## 2018-02-04 MED ORDER — PANTOPRAZOLE SODIUM 40 MG PO TBEC: 40.0000 mg | DELAYED_RELEASE_TABLET | Freq: Every day | ORAL | 1 refills | Status: DC

## 2018-02-04 MED ORDER — METFORMIN HCL 1000 MG PO TABS: 1000.0000 mg | ORAL_TABLET | Freq: Two times a day (BID) | ORAL | 3 refills | Status: DC

## 2018-02-04 MED ORDER — GABAPENTIN 400 MG PO CAPS: 400.0000 mg | ORAL_CAPSULE | Freq: Three times a day (TID) | ORAL | 0 refills | Status: DC

## 2018-02-04 MED ORDER — GLUCOSE BLOOD VI STRP: ORAL_STRIP | 3 refills | Status: DC

## 2018-02-04 MED ORDER — LOSARTAN POTASSIUM 100 MG PO TABS: ORAL_TABLET | ORAL | 2 refills | Status: DC

## 2018-02-04 NOTE — Telephone Encounter (Signed)
Medications reviewed.  I do not fill the zinc or the magnesium as those are supplements.  Please find out if he is taking the Relafen.  Given his current kidney function it may be best to hold off on this medication and consider an alternative.

## 2018-02-08 NOTE — Progress Notes (Signed)
I left the patient a voicemail to give me a call back to reschedule his appointment.

## 2018-02-18 ENCOUNTER — Encounter: Payer: Self-pay | Admitting: Pharmacist

## 2018-02-18 ENCOUNTER — Ambulatory Visit (INDEPENDENT_AMBULATORY_CARE_PROVIDER_SITE_OTHER): Payer: Medicare Other | Admitting: Pharmacist

## 2018-02-18 DIAGNOSIS — IMO0002 Reserved for concepts with insufficient information to code with codable children: Secondary | ICD-10-CM

## 2018-02-18 DIAGNOSIS — E1140 Type 2 diabetes mellitus with diabetic neuropathy, unspecified: Secondary | ICD-10-CM | POA: Diagnosis not present

## 2018-02-18 DIAGNOSIS — E1165 Type 2 diabetes mellitus with hyperglycemia: Secondary | ICD-10-CM

## 2018-02-18 DIAGNOSIS — I10 Essential (primary) hypertension: Secondary | ICD-10-CM | POA: Diagnosis not present

## 2018-02-18 NOTE — Assessment & Plan Note (Signed)
#  Hypertension currently uncontrolled at 145/98 Hg. Goal BP <140/90. BP well controlled at last visit with PCP Dr. Caryl Bis. Patient reports adherence with medications and had taken all medications today. - Continue losartan 100 mg daily, furosemide 20 mg daily, metoprolol succinate 100 mg daily, amlodipine 10 mg daily

## 2018-02-18 NOTE — Progress Notes (Signed)
S:     Chief Complaint  Patient presents with  . Medication Management    Diabetes    Patient arrives in good spirits, ambulating without assistance.  Presents for diabetes evaluation, education, and management at the request of Dr. Caryl Bis (re-referred on lab work on 01/21/2018). Last seen by primary care provider on 01/21/2018. Last seen by pharmacy clinic on 12/03/2017 and patient was continued on current diabetic regimen.   A1c improved from previously, but is not quite at goal <7%.   Insurance coverage/medication affordability: [employer] [place]; Receives Trulicity and Jardiance from patient assistance  Patient reports adherence with medications.  Current diabetes medications include: metformin 9326 mg BID, Trulicity 1.5 mg once weekly (takes over the weekend at some point); Jardiance 25 mg QAM.  Current hypertension medications include: amlodipine 10 mg daily, losartan 100 mg once daily, metoprolol succinate 100 mg once daily Current hyperlipidemic medications include: atorvastatin 40mg  daily   Patient denies hypoglycemic s/sx. Patient denies hyperglycemic symptoms. Patient reports self foot exams.   Patient reported dietary habits: Eats 3 meals/day Breakfast: biscuit or toast, eggs, sausage. Normally eats out. Lunch: [employer] salad or other semi-healthy fast food option Dinner: Harriet Masson, sandwich  Drinks:unsweatened drinks     O:  Physical Exam Vitals signs reviewed.     Review of Systems  All other systems reviewed and are negative.   Lab Results  Component Value Date   HGBA1C 7.9 (H) 01/21/2018   Vitals:   02/18/18 1312 02/18/18 1354  BP: (!) 164/99 (!) 712/45    Basic Metabolic Panel BMP Latest Ref Rng & Units 01/21/2018 03/19/2017 10/30/2016  Glucose 70 - 99 mg/dL 148(H) 146(H) 250(H)  BUN 6 - 23 mg/dL 17 25(H) 13  Creatinine 0.40 - 1.50 mg/dL 1.44 1.44 1.21  Sodium 135 - 145 mEq/L 144 145 143  Potassium 3.5 - 5.1 mEq/L 4.1 4.3 3.8  Chloride 96  - 112 mEq/L 104 102 104  CO2 19 - 32 mEq/L 30 31 31   Calcium 8.4 - 10.5 mg/dL 9.9 10.4 9.5    Lipid Panel     Component Value Date/Time   CHOL 85 01/21/2018 1420   TRIG 204.0 (H) 01/21/2018 1420   HDL 30.10 (L) 01/21/2018 1420   CHOLHDL 3 01/21/2018 1420   VLDL 40.8 (H) 01/21/2018 1420   LDLCALC 23 08/07/2016 1156   LDLDIRECT 35.0 01/21/2018 1420    Self-Monitored Blood Glucose Results Patient reports mostly <200, however could not recall many readings.  Clinical ASCVD: No   A/P: #Diabetes currently uncontrolled, most recent A1c 7.9% 01/21/2018, improved from 8.4% on 10/01/2017; Goal A1c <7%.  - Continue Trulicity 1.5 mg weekly, Jardiance 25 mg daily, metformin 1000 mg BID. Patient approved for patient assistance for Jardiance and Trulicity through 80/99/8338.  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 He may benefit from pioglitazone vs. basal insulin depending on blood glucose readings  - Next A1C anticipated April 2020  #Hypertension currently uncontrolled at 145/98 Hg. Goal BP <140/90. BP well controlled at last office visit with Dr. Caryl Bis. Patient reports adherence with medications and had taken all medications today. - Continue losartan 100 mg daily, furosemide 20 mg daily, metoprolol succinate 100 mg daily, amlodipine 10 mg daily  #Hematuria/ albuminuria  -  Patient to follow up with nephrology 02/22/18, encouraged to keep this appointment.    Written patient instructions provided.  Total time in face to face counseling 60 minutes.    Follow up in Pharmacist Clinic Visit 2-3 weeks.   Patient seen with Isaias Sakai, PharmD, PGY1 Resident.   De Hollingshead, PharmD, [place] PGY2 Ambulatory Care Pharmacy Resident Phone: [phone]

## 2018-02-18 NOTE — Assessment & Plan Note (Signed)
#  Diabetes currently uncontrolled, most recent A1c 7.9% 01/21/2018, improved from 8.4% on 10/01/2017; Goal A1c <7%.  - Continue Trulicity 1.5 mg weekly, Jardiance 25 mg daily, metformin 1000 mg BID. Patient approved for patient assistance for Jardiance and Trulicity through 25/49/8264.  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 He may benefit from pioglitazone vs. basal insulin depending on blood glucose readings  - Next A1C anticipated April 2020

## 2018-02-18 NOTE — Patient Instructions (Addendum)
It was great to see you today!   Today you were given information on serving sizes and how to make a balanced meal to help your blood sugars.  To Do: 1. Check your blood sugar every morning, as soon as you wake up as your fasting blood sugar. Goal = less than 130. 2. Continue to check your blood sugar 2 hours after a meal each day. Goal = less than 180.  I made you a chart to record these numbers.   Date Fasting  2 Hours After Lunch 2 Hours After Supper Meal                                                                                                                                  Follow up with me in 2-3 weeks.   Catie Darnelle Maffucci, PharmD

## 2018-02-22 DIAGNOSIS — E1122 Type 2 diabetes mellitus with diabetic chronic kidney disease: Secondary | ICD-10-CM | POA: Diagnosis not present

## 2018-02-22 DIAGNOSIS — I129 Hypertensive chronic kidney disease with stage 1 through stage 4 chronic kidney disease, or unspecified chronic kidney disease: Secondary | ICD-10-CM | POA: Diagnosis not present

## 2018-02-22 DIAGNOSIS — N183 Chronic kidney disease, stage 3 (moderate): Secondary | ICD-10-CM | POA: Diagnosis not present

## 2018-02-22 DIAGNOSIS — R809 Proteinuria, unspecified: Secondary | ICD-10-CM | POA: Diagnosis not present

## 2018-02-22 NOTE — Progress Notes (Signed)
I have reviewed the above note and agree. I was available to the pharmacist for consultation.  Zuleyka Kloc, MD  

## 2018-02-25 ENCOUNTER — Ambulatory Visit: Payer: Medicare Other | Admitting: Pharmacist

## 2018-02-28 ENCOUNTER — Other Ambulatory Visit: Payer: Self-pay | Admitting: Family Medicine

## 2018-03-04 ENCOUNTER — Other Ambulatory Visit: Payer: Self-pay | Admitting: Nephrology

## 2018-03-04 ENCOUNTER — Other Ambulatory Visit (HOSPITAL_COMMUNITY): Payer: Self-pay | Admitting: Nephrology

## 2018-03-04 DIAGNOSIS — N183 Chronic kidney disease, stage 3 unspecified: Secondary | ICD-10-CM

## 2018-03-11 ENCOUNTER — Encounter: Payer: Self-pay | Admitting: Pharmacist

## 2018-03-11 ENCOUNTER — Ambulatory Visit (INDEPENDENT_AMBULATORY_CARE_PROVIDER_SITE_OTHER): Payer: Medicare Other | Admitting: Pharmacist

## 2018-03-11 VITALS — BP 150/109 | HR 72 | Wt 178.4 lb

## 2018-03-11 DIAGNOSIS — E1165 Type 2 diabetes mellitus with hyperglycemia: Secondary | ICD-10-CM | POA: Diagnosis not present

## 2018-03-11 DIAGNOSIS — E1140 Type 2 diabetes mellitus with diabetic neuropathy, unspecified: Secondary | ICD-10-CM

## 2018-03-11 DIAGNOSIS — IMO0002 Reserved for concepts with insufficient information to code with codable children: Secondary | ICD-10-CM

## 2018-03-11 MED ORDER — GLIPIZIDE 5 MG PO TABS: ORAL_TABLET | ORAL | 2 refills | Status: DC

## 2018-03-11 MED ORDER — BLOOD GLUCOSE METER KIT: PACK | 0 refills | Status: DC

## 2018-03-11 NOTE — Assessment & Plan Note (Signed)
#  Diabetes currently uncontrolled, most recent A1c 7.9% 01/21/2018, improved from 8.4% on 10/01/2017; Goal A1c <7%. Appropriate to initiate prandial coverage, and patient is resistant to insulin therapy - Start glipizide 5 mg with supper. Patient educated to take with lunch, if lunch is going to be the largest meal of the day.  - Continue Trulicity 1.5 mg weekly, Jardiance 25 mg daily, metformin 1000 mg BID. Patient approved for patient assistance for Jardiance and Trulicity through 75/05/1831.  - Encouraged to continue to check fasting and 2 hour post prandial to evaluate impact of glipizide therapy - Encouraged to contact clinic with any hypoglycemia

## 2018-03-11 NOTE — Progress Notes (Signed)
S:     Chief Complaint  Patient presents with  . Medication Management    Diabetes    Patient arrives in good spirits, ambulating without assistance.  Presents for diabetes evaluation, education, and management at the request of Dr. Caryl Bis (re-referred on lab work on 01/21/2018). Last seen by primary care provider on 01/21/2018. Last seen by pharmacy clinic on 02/18/2018. At that time, he was asked to check more SMBG to better guide therapy.  Today, he brings in a glucometer, but reports that he is almost out of strips and he was told by his pharmacy that this brand is no longer produced.  Insurance coverage/medication affordability: [employer] Part D; Receives Trulicity and Jardiance from patient assistance  Patient reports adherence with medications.  Current diabetes medications include: metformin 5784 mg BID, Trulicity 1.5 mg once weekly (takes over the weekend at some point); Jardiance 25 mg QAM.  Current hypertension medications include: amlodipine 10 mg daily, losartan 100 mg once daily, metoprolol succinate 100 mg once daily Current hyperlipidemic medications include: atorvastatin 40mg  daily   Patient denies hypoglycemic s/sx. Patient denies hyperglycemic symptoms. Patient reports self foot exams.    O:  Physical Exam Vitals signs reviewed.  Constitutional:      Appearance: Normal appearance.     Review of Systems  All other systems reviewed and are negative.   Lab Results  Component Value Date   HGBA1C 7.9 (H) 01/21/2018   Vitals:   03/11/18 1205  BP: (!) 150/109  Pulse: 72  Patient did not take antihypertensives before coming to clinic today; notes that he believes they raise his BG   Basic Metabolic Panel BMP Latest Ref Rng & Units 01/21/2018 03/19/2017 10/30/2016  Glucose 70 - 99 mg/dL 148(H) 146(H) 250(H)  BUN 6 - 23 mg/dL 17 25(H) 13  Creatinine 0.40 - 1.50 mg/dL 1.44 1.44 1.21  Sodium 135 - 145 mEq/L 144 145 143  Potassium 3.5 - 5.1 mEq/L 4.1 4.3 3.8    Chloride 96 - 112 mEq/L 104 102 104  CO2 19 - 32 mEq/L 30 31 31   Calcium 8.4 - 10.5 mg/dL 9.9 10.4 9.5    Lipid Panel     Component Value Date/Time   CHOL 85 01/21/2018 1420   TRIG 204.0 (H) 01/21/2018 1420   HDL 30.10 (L) 01/21/2018 1420   CHOLHDL 3 01/21/2018 1420   VLDL 40.8 (H) 01/21/2018 1420   LDLCALC 23 08/07/2016 1156   LDLDIRECT 35.0 01/21/2018 1420    Self-Monitored Blood Glucose Results Fasting: 138, 141, 134, 173 Post-supper: 250, 263, 168  Clinical ASCVD: No   A/P: #Diabetes currently uncontrolled, most recent A1c 7.9% 01/21/2018, improved from 8.4% on 10/01/2017; Goal A1c <7%. Appropriate to initiate prandial coverage, and patient is resistant to insulin therapy - Start glipizide 5 mg with supper. Patient educated to take with lunch, if lunch is going to be the largest meal of the day.  - Continue Trulicity 1.5 mg weekly, Jardiance 25 mg daily, metformin 1000 mg BID. Patient approved for patient assistance for Jardiance and Trulicity through 69/62/9528.  - Encouraged to continue to check fasting and 2 hour post prandial to evaluate impact of glipizide therapy - Encouraged to contact clinic with any hypoglycemia  #Hypertension currently uncontrolled. Goal BP <140/90. BP well controlled at last PCP visit; he notes that he did NOT take his antihypertensives before clinic today - Continue losartan 100 mg daily, furosemide 20 mg daily, metoprolol succinate 100 mg daily, amlodipine 10 mg daily  Written patient instructions provided.  Total time in face to face counseling 30 minutes.    Follow up in Pharmacist Clinic Visit 4 weeks  De Hollingshead, PharmD, [place] PGY2 Ambulatory Care Pharmacy Resident Phone: [phone]

## 2018-03-11 NOTE — Patient Instructions (Addendum)
It was great to see you today!  We are going to make one change:   1) Start glipizide 5 mg once daily with a meal. Take with supper. You can take with lunch if you are going to have a larger lunch that day. If you don't eat a meal, do NOT take glipizide.   Continue checking your blood sugar: 1) Fasting, before eating breakfast. The goal fasting blood sugar is less than 130 mg/dL 2) 2 hours after the largest meal of your day. The goal 2-hour after meal blood sugar is less than 180 mg/dL   Please feel free to contact clinic with any questions or concerns or if you start to experience low blood sugars.  Schedule follow up with me in 4 weeks.   Catie Darnelle Maffucci, PharmD

## 2018-03-12 ENCOUNTER — Other Ambulatory Visit: Payer: Self-pay | Admitting: Family Medicine

## 2018-03-18 NOTE — Progress Notes (Signed)
I have reviewed the above note and agree. I was available to the pharmacist for consultation.  Solange Emry, MD  

## 2018-04-01 ENCOUNTER — Other Ambulatory Visit: Payer: Self-pay

## 2018-04-01 ENCOUNTER — Other Ambulatory Visit: Payer: Self-pay | Admitting: Family Medicine

## 2018-04-01 ENCOUNTER — Ambulatory Visit
Admission: RE | Admit: 2018-04-01 | Discharge: 2018-04-01 | Disposition: A | Discharge status: Home / Self Care | Payer: Medicare Other | Source: Ambulatory Visit | Attending: Nephrology | Admitting: Nephrology

## 2018-04-01 DIAGNOSIS — N183 Chronic kidney disease, stage 3 unspecified: Secondary | ICD-10-CM

## 2018-04-01 DIAGNOSIS — N281 Cyst of kidney, acquired: Secondary | ICD-10-CM | POA: Diagnosis not present

## 2018-04-02 ENCOUNTER — Other Ambulatory Visit: Payer: Self-pay | Admitting: Family Medicine

## 2018-04-03 DIAGNOSIS — I129 Hypertensive chronic kidney disease with stage 1 through stage 4 chronic kidney disease, or unspecified chronic kidney disease: Secondary | ICD-10-CM | POA: Diagnosis not present

## 2018-04-03 DIAGNOSIS — R809 Proteinuria, unspecified: Secondary | ICD-10-CM | POA: Diagnosis not present

## 2018-04-03 DIAGNOSIS — N183 Chronic kidney disease, stage 3 (moderate): Secondary | ICD-10-CM | POA: Diagnosis not present

## 2018-04-03 DIAGNOSIS — E1122 Type 2 diabetes mellitus with diabetic chronic kidney disease: Secondary | ICD-10-CM | POA: Diagnosis not present

## 2018-04-08 ENCOUNTER — Telehealth: Payer: Self-pay | Admitting: Pharmacist

## 2018-04-08 ENCOUNTER — Ambulatory Visit: Payer: Medicare Other | Admitting: Pharmacist

## 2018-04-08 DIAGNOSIS — IMO0002 Reserved for concepts with insufficient information to code with codable children: Secondary | ICD-10-CM

## 2018-04-08 DIAGNOSIS — E1165 Type 2 diabetes mellitus with hyperglycemia: Principal | ICD-10-CM

## 2018-04-08 DIAGNOSIS — E1140 Type 2 diabetes mellitus with diabetic neuropathy, unspecified: Secondary | ICD-10-CM

## 2018-04-08 MED ORDER — GLIPIZIDE 5 MG PO TABS: 5.0000 mg | ORAL_TABLET | Freq: Two times a day (BID) | ORAL | 3 refills | Status: DC

## 2018-04-08 NOTE — Telephone Encounter (Signed)
S:     Chief Complaint  Patient presents with  . Medication Management    Diabetes    Patient contacted telephonically for diabetes evaluation, education, and management at the request of Dr. Caryl Bis (re-referred on lab work on 01/21/2018). Last seen by primary care provider on 01/21/2018. Last Rx Clinic visit on 03/11/2018 - at that time, glipizide was initiated to aid in prandial glucose control.   He notes that he saw nephrology Dr. Juleen China in the interim. 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 He notes that he cannot afford to continue to pay the cost for these visits in addition to copays for primary care and nephrology. He asks if I could ask Dr. Caryl Bis if a letter could be written to DOT asking to extend the frequency of the DOT appointments, as the patient is following closely with [name] for chronic health management. Will pass this request along to PCP.  Today, he notes that he has been doing well on glipizide therapy. Notes that he has been checking fasting BG, then checking 2 hours later, every day. Has not been checking 2 hours post lunch or post supper.  Insurance coverage/medication affordability: [employer] Part D; Receives Trulicity and Jardiance from patient assistance  Patient reports adherence with medications.  Current diabetes medications include: metformin 9326 mg BID, Trulicity 1.5 mg weekly, Jardiance 25 mg daily, glipizide 5 mg with the first meal of his day (breakfast or lunch)  Patient denies hypoglycemic s/sx including dizziness, shakiness, sweating.   O:   Lab Results  Component Value Date   HGBA1C 7.9 (H) 71/24/5809   Basic Metabolic Panel BMP Latest Ref Rng & Units 01/21/2018 03/19/2017 10/30/2016  Glucose 70 - 99 mg/dL 148(H) 146(H) 250(H)  BUN 6 - 23 mg/dL 17 25(H) 13  Creatinine 0.40 - 1.50 mg/dL 1.44 1.44 1.21  Sodium 135 - 145  mEq/L 144 145 143  Potassium 3.5 - 5.1 mEq/L 4.1 4.3 3.8  Chloride 96 - 112 mEq/L 104 102 104  CO2 19 - 32 mEq/L 30 31 31   Calcium 8.4 - 10.5 mg/dL 9.9 10.4 9.5   Lipid Panel     Component Value Date/Time   CHOL 85 01/21/2018 1420   TRIG 204.0 (H) 01/21/2018 1420   HDL 30.10 (L) 01/21/2018 1420   CHOLHDL 3 01/21/2018 1420   VLDL 40.8 (H) 01/21/2018 1420   LDLCALC 23 08/07/2016 1156   LDLDIRECT 35.0 01/21/2018 1420    Self-Monitored Blood Glucose Results  Fasting  2 hour later (sometimes ate breakfast and took glipizide)     152 141   149 133   145 150   151 135   161 148   153 142   149 138  Avg 151 141    A/P: #Diabetes - Currently uncontrolled, most recent A1c 7.9% on 01/21/2018; Goal A1c <7%. Previously, patient had significantly elevated post-supper readings, prompting initiation of glipizide therapy; however, patient has been taking glipizide with the first meal of the day. Will add a second dose of glipizide with supper, as this is his largest meal.  - Increase glipizide to 5 mg BID (first and last meal of the day) - Continue metformin 1000 mg BID, Jardiance 25 mg daily, and Trulicity 1.5 mg once weekly.  - Encouraged patient to continue to check fasting blood sugars, but also check 2 hours post prandial for the meals that he takes glipizide with (  eg, check 2 hours after breakfast and 2 hours after supper) to determine impact of glipizide therapy    Follow up in Pharmacist Clinic Visit 4 weeks.  De Hollingshead, PharmD, [place] PGY2 Ambulatory Care Pharmacy Resident Phone: [phone]

## 2018-04-08 NOTE — Telephone Encounter (Signed)
Communicated this information with the patient. He stated that he would call the DOT doctor and ask them to fax something stating the requirement to [facility]. I provided the fax number.

## 2018-04-08 NOTE — Telephone Encounter (Signed)
I would need to know what the reasoning behind the DOT visit frequency is. This would need to come directly from the DOT. I would need to know this prior to writing any letter. If he could contact the DOT and have them send me their reasoning for the frequency of visits, I could then review it and determine if it is reasonable for me to write a letter.   [name], MD

## 2018-04-08 NOTE — Patient Outreach (Signed)
Error Opened in wrong context  °

## 2018-04-29 ENCOUNTER — Other Ambulatory Visit: Payer: Self-pay | Admitting: Family Medicine

## 2018-05-06 ENCOUNTER — Encounter: Payer: Self-pay | Admitting: Family Medicine

## 2018-05-06 ENCOUNTER — Other Ambulatory Visit: Payer: Self-pay

## 2018-05-06 ENCOUNTER — Ambulatory Visit (INDEPENDENT_AMBULATORY_CARE_PROVIDER_SITE_OTHER): Payer: Medicare Other | Admitting: Family Medicine

## 2018-05-06 ENCOUNTER — Telehealth: Payer: Self-pay | Admitting: Pharmacist

## 2018-05-06 ENCOUNTER — Ambulatory Visit: Payer: Medicare Other | Admitting: Pharmacist

## 2018-05-06 DIAGNOSIS — G629 Polyneuropathy, unspecified: Secondary | ICD-10-CM | POA: Diagnosis not present

## 2018-05-06 DIAGNOSIS — E1140 Type 2 diabetes mellitus with diabetic neuropathy, unspecified: Secondary | ICD-10-CM | POA: Diagnosis not present

## 2018-05-06 DIAGNOSIS — R1310 Dysphagia, unspecified: Secondary | ICD-10-CM | POA: Diagnosis not present

## 2018-05-06 DIAGNOSIS — E1165 Type 2 diabetes mellitus with hyperglycemia: Secondary | ICD-10-CM | POA: Diagnosis not present

## 2018-05-06 DIAGNOSIS — G4730 Sleep apnea, unspecified: Secondary | ICD-10-CM

## 2018-05-06 DIAGNOSIS — IMO0002 Reserved for concepts with insufficient information to code with codable children: Secondary | ICD-10-CM

## 2018-05-06 NOTE — Telephone Encounter (Signed)
I have reviewed the above note and agree. I was available to the pharmacist for consultation. Please see my note from the same day.   [name], MD

## 2018-05-06 NOTE — Progress Notes (Signed)
Virtual Visit via video Note  This visit type was conducted due to national recommendations for restrictions regarding the COVID-19 pandemic (e.g. social distancing).  This format is felt to be most appropriate for this patient at this time.  All issues noted in this document were discussed and addressed.  No physical exam was performed (except for noted visual exam findings with Video Visits).   I connected with Shane Jones on 05/06/18 at  2:45 PM EDT by a video enabled telemedicine application or telephone and verified that I am speaking with the correct person using two identifiers. Location patient: car, not driving Location provider: work Persons participating in the virtual visit: patient, provider, Designer, fashion/clothing (Brother)  I discussed the limitations, risks, security and privacy concerns of performing an evaluation and management service by telephone and the availability of in person appointments. I also discussed with the patient that there may be a patient responsible charge related to this service. The patient expressed understanding and agreed to proceed.   Reason for visit: follow-up  HPI: Diabetes: CBGs have improved significantly.  Ranging 110-130 fasting.  130-152 hours postprandial.  Taking Trulicity, Jardiance, and glipizide.  No polyuria or polydipsia.  No hypoglycemia.  He reports he has seen ophthalmology in the last year.  Neuropathy: Patient does note he has some burning sensation in his feet bilaterally.  He will take 1-2 gabapentin tablets twice daily.  He saw our clinical pharmacist earlier today and they noted that the maximum dose given his recent kidney function was a total dose of 900 mg daily.  She noted that the risk of this is increased risk of drowsiness.  Dysphagia: Patient has been taking Protonix.  He did see GI and had an esophageal dilatation.  He has had no swallowing issues since then.  He has had no reflux symptoms.  No blood in his stool.  OSA: Patient  notes he is sleeping quite well and is waking well rested.  He did not have the home sleep study completed.  He notes his wife has advised him he is no longer snoring.   ROS: See pertinent positives and negatives per HPI.  Past Medical History:  Diagnosis Date  . Diabetes mellitus without complication ([place])   . Gout   . Hypertension   . Sleep apnea    has CPAP, doesn't use  . Wears dentures    full upper and lower    Past Surgical History:  Procedure Laterality Date  . BALLOON DILATION N/A 10/22/2017   Procedure: BALLOON DILATION;  Surgeon: Lucilla Lame, MD;  Location: [facility];  Service: Endoscopy;  Laterality: N/A;  . ESOPHAGOGASTRODUODENOSCOPY (EGD) WITH PROPOFOL N/A 10/22/2017   Procedure: ESOPHAGOGASTRODUODENOSCOPY (EGD) WITH Biopsy;  Surgeon: Lucilla Lame, MD;  Location: [facility];  Service: Endoscopy;  Laterality: N/A;  . TONSILLECTOMY      No family history on file.  SOCIAL HX: Former smoker.   Current Outpatient Medications:  .  Accu-Chek FastClix Lancets MISC, USE UP TO FOUR TIMES DAILY AS DIRECTED, Disp: 102 each, Rfl: 2 .  ACCU-CHEK GUIDE test strip, USE UP TO FOUR TIMES DAILY AS DIRECTED, Disp: 100 each, Rfl: 2 .  allopurinol (ZYLOPRIM) 100 MG tablet, TAKE 1 TABLET DAILY, Disp: 90 tablet, Rfl: 03 .  amLODipine (NORVASC) 10 MG tablet, TAKE 1 TABLET EVERY DAY, Disp: 90 tablet, Rfl: 2 .  aspirin 325 MG tablet, Take 325 mg by mouth daily., Disp: , Rfl:  .  atorvastatin (LIPITOR) 40 MG tablet, TAKE  1 TABLET EVERY DAY, Disp: 90 tablet, Rfl: 3 .  B Complex Vitamins (VITAMIN-B COMPLEX PO), Take by mouth., Disp: , Rfl:  .  BETA CAROTENE PO, Take by mouth., Disp: , Rfl:  .  blood glucose meter kit and supplies, Dispense based on patient and insurance preference. Use up to four times daily as directed. (FOR ICD-10 E10.9, E11.9)., Disp: 1 each, Rfl: 0 .  Calcium Carbonate-Vitamin D (CALCIUM + D PO), Take by mouth., Disp: , Rfl:  .  Dulaglutide  (TRULICITY) 1.5 SX/1.1BZ SOPN, Inject 1 pen into the skin once a week., Disp: 4 pen, Rfl: 0 .  empagliflozin (JARDIANCE) 25 MG TABS tablet, Take 25 mg by mouth daily., Disp: 90 tablet, Rfl: 3 .  escitalopram (LEXAPRO) 10 MG tablet, TAKE 1 TABLET(10 MG) BY MOUTH DAILY, Disp: 90 tablet, Rfl: 0 .  furosemide (LASIX) 20 MG tablet, TAKE 1 TABLET (20 MG TOTAL) DAILY, Disp: 90 tablet, Rfl: 0 .  gabapentin (NEURONTIN) 400 MG capsule, TAKE 1 CAPSULE(400 MG) BY MOUTH THREE TIMES DAILY, Disp: 270 capsule, Rfl: 0 .  glipiZIDE (GLUCOTROL) 5 MG tablet, Take 1 tablet (5 mg total) by mouth 2 (two) times daily before a meal., Disp: 180 tablet, Rfl: 3 .  losartan (COZAAR) 100 MG tablet, TAKE 1 TABLET EVERY DAY, Disp: 90 tablet, Rfl: 2 .  magnesium 30 MG tablet, Take 30 mg by mouth 2 (two) times daily., Disp: , Rfl:  .  metFORMIN (GLUCOPHAGE) 1000 MG tablet, TAKE 1 TABLET BY MOUTH TWICE DAILY, WITH MEALS, Disp: 180 tablet, Rfl: 3 .  metoprolol succinate (TOPROL-XL) 100 MG 24 hr tablet, TAKE 1 TABLET EVERY DAY, Disp: 90 tablet, Rfl: 1 .  Multiple Vitamin (MULTIVITAMIN) tablet, Take 1 tablet by mouth daily., Disp: , Rfl:  .  pantoprazole (PROTONIX) 40 MG tablet, Take 1 tablet (40 mg total) by mouth daily., Disp: 90 tablet, Rfl: 1 .  potassium chloride (K-DUR) 10 MEQ tablet, TAKE 1 TABLET BY MOUTH DAILY WITH LASIX, Disp: 90 tablet, Rfl: 0 .  Zinc Sulfate (ZINC 15 PO), Take by mouth., Disp: , Rfl:   EXAM:  VITALS per patient if applicable: None.  GENERAL: alert, oriented, appears well and in no acute distress  HEENT: atraumatic, conjunttiva clear, no obvious abnormalities on inspection of external nose and ears  NECK: normal movements of the head and neck  LUNGS: on inspection no signs of respiratory distress, breathing rate appears normal, no obvious gross SOB, gasping or wheezing  CV: no obvious cyanosis  MS: moves all visible extremities without noticeable abnormality  PSYCH/NEURO: pleasant and  cooperative, no obvious depression or anxiety, speech and thought processing grossly intact  ASSESSMENT AND PLAN:  Discussed the following assessment and plan:  Sleep apnea, unspecified type  Dysphagia, unspecified type  Type 2 diabetes, uncontrolled, with neuropathy (HCC) - Plan: POCT HgB A1C  Neuropathy (HCC)  Sleep apnea Home sleep study reordered.  Dysphagia No recurrence of symptoms.  We will continue Protonix.  Type 2 diabetes, uncontrolled, with neuropathy ([place]) Much improved control.  We will have him come in in 2 to 3 weeks for an A1c.  Continue current medications.  Neuropathy ([place]) Discussed potentially switching him to Lyrica.  We will check with our clinical pharmacist to determine the best way to do this and to see if it would be affordable for the patient.   Social distancing precautions and sick precautions given regarding COVID-19.   I discussed the assessment and treatment plan with the patient. The patient  was provided an opportunity to ask questions and all were answered. The patient agreed with the plan and demonstrated an understanding of the instructions.   The patient was advised to call back or seek an in-person evaluation if the symptoms worsen or if the condition fails to improve as anticipated.   [name], MD

## 2018-05-06 NOTE — Telephone Encounter (Signed)
S:     Chief Complaint  Patient presents with  . Medication Management    Diabetes    Patient contacted telephonically for diabetes evaluation, education, and management at the request of Dr. Caryl Bis (re-referred on lab work on 01/21/2018). Last seen by primary care provider on 01/21/2018.   He notes that he is "feeling great" with the blood sugar control he has achieved with this medication regimen. He denies any episodes of hypoglycemia with glipizide therapy.   He notes that his insurance is stating it is too soon to fill gabapentin. He notes that he is taking gabapentin 1-2 QAM and 1-2 QPM, though prescription is written for 3 per day. He plans to talk to Dr. Caryl Bis about this at their virtual appointment later today.  Insurance coverage/medication affordability: [employer] D; Receives Trulicity and Jardiance from patient assistance  Patient reports adherence with medications.  Current diabetes medications include: metformin 1308 mg BID, Trulicity 1.5 mg weekly, Jardiance 25 mg daily, glipizide 5 mg BID  Patient denies hypoglycemic s/sx including dizziness, shakiness, sweating. Patient reports reduction in hyperglycemic symptoms - he notes that his vision is much improved.    O:   Lab Results  Component Value Date   HGBA1C 7.9 (H) 65/78/4696   Basic Metabolic Panel BMP Latest Ref Rng & Units 01/21/2018 03/19/2017 10/30/2016  Glucose 70 - 99 mg/dL 148(H) 146(H) 250(H)  BUN 6 - 23 mg/dL 17 25(H) 13  Creatinine 0.40 - 1.50 mg/dL 1.44 1.44 1.21  Sodium 135 - 145 mEq/L 144 145 143  Potassium 3.5 - 5.1 mEq/L 4.1 4.3 3.8  Chloride 96 - 112 mEq/L 104 102 104  CO2 19 - 32 mEq/L _0 Calcium 8.4 - 10.5 mg/dL 9.9 10.4 9.5   Lipid Panel     Component Value Date/Time   CHOL 85 01/21/2018 1420   TRIG 204.0 (H) 01/21/2018 1420   HDL 30.10 (L) 01/21/2018 1420   CHOLHDL 3 01/21/2018 1420   VLDL 40.8 (H) 01/21/2018 1420   LDLCALC 23 08/07/2016 1156   LDLDIRECT 35.0  01/21/2018 1420    Self-Monitored Blood Glucose Results Fasting SMBG: 110-130s 2 hour post-prandial/random SMBG: 130-150s   A/P: #Diabetes - Currently uncontrolled but MUCH improved per SMBG results, most recent A1c 7.9% on 01/21/2018. Goal A1c <7%.  - Continue metformin 1000 mg BID, glipizide 5 mg BID, Jardiance 25 mg daily, Trulicity 1.5 mg weekly - Will defer follow up lab plans to Dr. Caryl Bis during their appointment this afternoon.  #Medication Management - With most recent eGFR <45, gabapentin dosing is recommended to be limited to 900 mg daily in 2-3 divided doses; patient is currently taking 1200-1600 mg daily in 2 divided doses. Patient is not having troublesome sedation/dizziness as a result of gabapentin therapy, however. Will route to Dr. Caryl Bis to discuss with patient during their appointment this afternoon.     Follow up in Pharmacist Clinic Visit 4 weeks.   Patient seen with Isaias Sakai, PharmD, PGY1 Pharmacy Resident  De Hollingshead, PharmD, [place] PGY2 Ambulatory Care Pharmacy Resident Phone: [phone]

## 2018-05-07 ENCOUNTER — Telehealth: Payer: Self-pay | Admitting: Family Medicine

## 2018-05-07 NOTE — Telephone Encounter (Signed)
Shane Jones, can you contact the patient and get him set up for a nurse visit for an A1c in 2 to 3 weeks.  He will also need follow-up in the office in 4 months.  Melissa, I have placed an order for a home sleep study.  Can you get this set up for the patient?  Catie, I discussed the patient's gabapentin with him.  I would like to potentially switch him to Lyrica.  I wanted to see if you could look at his insurance to see if this would be an affordable option and if so what would be the best way to get him off of the gabapentin and onto the Lyrica.  Thanks.

## 2018-05-07 NOTE — Telephone Encounter (Signed)
Order has been refaxed to [facility]. They will have to contact him to set up his appointment.

## 2018-05-07 NOTE — Assessment & Plan Note (Signed)
Discussed potentially switching him to Lyrica.  We will check with our clinical pharmacist to determine the best way to do this and to see if it would be affordable for the patient.

## 2018-05-07 NOTE — Assessment & Plan Note (Signed)
No recurrence of symptoms.  We will continue Protonix.

## 2018-05-07 NOTE — Telephone Encounter (Signed)
Called and spoke with pt. Pt has been scheduled for lab work on June 1 at 11:15 to recheck A1c, pt is aware that he has a phone appt the same day with Catie around 1 PM that day. Pt scheduled for the 4 month follow up 09/16/2018.

## 2018-05-07 NOTE — Assessment & Plan Note (Addendum)
Much improved control.  We will have him come in in 2 to 3 weeks for an A1c.  Continue current medications.

## 2018-05-07 NOTE — Assessment & Plan Note (Signed)
Home sleep study reordered.

## 2018-05-09 NOTE — Telephone Encounter (Signed)
It appears pregabalin is still Tier 3 on [employer] Part D plans. I'm not sure exactly which plan he has, but this will be the same price that Trulicity/Jardiance would have been.   However, it looks like a GoodRx coupon at [facility] would bring price down to ~$20. Slightly more expensive at [facility] w/ GoodRx, much more expensive at [facility]. I think pregabalin would be worth a try.

## 2018-05-11 NOTE — Telephone Encounter (Signed)
Please contact the patient and let him know that Lyrica would be the same price as the patient's Trulicity and Jardiance would have been with his insurance.  Though it appears that the Lyrica would cost about $20 with a good Rx coupon.  If that is an affordable price for him we could try transitioning him from gabapentin to Lyrica.

## 2018-05-14 NOTE — Telephone Encounter (Signed)
Noted. Please try to contact the patient tomorrow.

## 2018-05-14 NOTE — Telephone Encounter (Signed)
Called Pt No answer, Left a VM to call office will try back later.

## 2018-05-15 NOTE — Telephone Encounter (Signed)
Called Pt back today still  No answer, Left a VM to call office will try back later

## 2018-05-15 NOTE — Telephone Encounter (Signed)
Noted  

## 2018-06-03 ENCOUNTER — Telehealth: Payer: Self-pay | Admitting: Pharmacist

## 2018-06-03 ENCOUNTER — Other Ambulatory Visit: Payer: Self-pay

## 2018-06-03 ENCOUNTER — Other Ambulatory Visit (INDEPENDENT_AMBULATORY_CARE_PROVIDER_SITE_OTHER): Payer: Medicare Other

## 2018-06-03 ENCOUNTER — Ambulatory Visit: Payer: Medicare Other | Admitting: Pharmacist

## 2018-06-03 DIAGNOSIS — E1140 Type 2 diabetes mellitus with diabetic neuropathy, unspecified: Secondary | ICD-10-CM | POA: Diagnosis not present

## 2018-06-03 DIAGNOSIS — E1165 Type 2 diabetes mellitus with hyperglycemia: Secondary | ICD-10-CM | POA: Diagnosis not present

## 2018-06-03 DIAGNOSIS — IMO0002 Reserved for concepts with insufficient information to code with codable children: Secondary | ICD-10-CM

## 2018-06-03 LAB — POCT GLYCOSYLATED HEMOGLOBIN (HGB A1C): Hemoglobin A1C: 7.7 % — AB (ref 4.0–5.6)

## 2018-06-03 NOTE — Telephone Encounter (Signed)
S:     Chief Complaint  Patient presents with  . Medication Management    Diabetes    Patient contacted telephonically for diabetes evaluation, education, and management at the request of Dr. Caryl Bis (re-referred on lab work on 01/21/2018). Last seen by primary care provider on 05/06/2018. Last seen by pharmacy clinic also on 05/06/2018 - no changes were made at that time.   Today, patient is in moderate spirits. He is pleased with his A1c result, as he notes that he has had a very difficult time "getting the foods I should be eating" due to social distancing and trying to keep his wife safe. He is pleased with any improvement/maintenance in glycemic control, though knows that the eventual goal is A1c <7%.   We discussed the potential of changing to pregabalin from gabapentin, and discussed the risk of increased s/x from gabapentin dosing with his current level of renal function.   Insurance coverage/medication affordability: [employer] Part D  Patient reports adherence with medications.  Current diabetes medications include: metformin 1000 mg BID, Jardiance 25 mg daily, Trulicity 1.5 mg weekly, glipizide 5 mg BID Current hypertension medications include: losartan 100 mg daily, metoprolol XL 100 mg daily, amlodipine 10 mg daily Current hyperlipidemic medications include: atorvastatin 40 mg daily  Patient denies hypoglycemic s/sx including dizziness, shakiness, sweating. Patient denies hyperglycemic symptoms including polyuria, polydipsia, polyphagia, nocturia, neuropathy, blurred vision.   Patient reported exercise habits: notes that he walks around his yard a lot with his dog, Rusty.    O:   Lab Results  Component Value Date   HGBA1C 7.7 (A) 32/95/1884    Basic Metabolic Panel BMP Latest Ref Rng & Units 01/21/2018 03/19/2017 10/30/2016  Glucose 70 - 99 mg/dL 148(H) 146(H) 250(H)  BUN 6 - 23 mg/dL 17 25(H) 13  Creatinine 0.40 - 1.50 mg/dL 1.44 1.44 1.21  Sodium 135 - 145 mEq/L 144 145  143  Potassium 3.5 - 5.1 mEq/L 4.1 4.3 3.8  Chloride 96 - 112 mEq/L 104 102 104  CO2 19 - 32 mEq/L 30 31 31   Calcium 8.4 - 10.5 mg/dL 9.9 10.4 9.5   Lipid Panel     Component Value Date/Time   CHOL 85 01/21/2018 1420   TRIG 204.0 (H) 01/21/2018 1420   HDL 30.10 (L) 01/21/2018 1420   CHOLHDL 3 01/21/2018 1420   VLDL 40.8 (H) 01/21/2018 1420   LDLCALC 23 08/07/2016 1156   LDLDIRECT 35.0 01/21/2018 1420    Self-Monitored Blood Glucose Results Fasting SMBG: All <160, generally 150s 2 hour post-prandial/random SMBG: all <180.  Clinical ASCVD: Yes   A/P: #Diabetes - Currently uncontrolled yet improved, most recent A1c 7.7% today, improved from 7.9%; Goal A1c <7%. As post prandial glucoses are controlled, will avoid increasing glipizide dosing at this time.  - Patient would like to focus on dietary and lifestyle interventions at this time. Discussed increasing physical activity and discussed healthy food options. Patient denies that financial insecurity is contributing to his dietary choices, just that he is trying to avoid being in grocery stores too often.  - Continue metformin 1000 mg BID, Jardiance 25 mg daily, Trulicity 1.5 mg weekly, and glipizide 5 mg BID - If fastings continue to be uncontrolled moving forward, could consider low dose basal insulin. 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 If patient continues to not have HF/lower extremity edema issues, could consider low dose of pioglitazone as well.    Follow up in Pharmacist Clinic Visit TBD,  follow up with PCP as scheduled.   Patient seen with Gwenlyn Found, PharmD, PGY1 pharmacy resident  De Hollingshead, PharmD, [place] PGY2 Ambulatory Care Pharmacy Resident Phone: [phone]

## 2018-06-03 NOTE — Telephone Encounter (Signed)
I have reviewed the above note and agree. I was available to the pharmacist for consultation.  Raihana Balderrama, MD  

## 2018-06-04 ENCOUNTER — Encounter: Payer: Self-pay | Admitting: Family Medicine

## 2018-06-04 DIAGNOSIS — R0602 Shortness of breath: Secondary | ICD-10-CM | POA: Diagnosis not present

## 2018-06-04 DIAGNOSIS — G4733 Obstructive sleep apnea (adult) (pediatric): Secondary | ICD-10-CM | POA: Diagnosis not present

## 2018-06-04 LAB — PULMONARY FUNCTION TEST

## 2018-06-05 ENCOUNTER — Encounter: Payer: Self-pay | Admitting: Family Medicine

## 2018-06-05 DIAGNOSIS — G4733 Obstructive sleep apnea (adult) (pediatric): Secondary | ICD-10-CM | POA: Diagnosis not present

## 2018-06-05 DIAGNOSIS — R0602 Shortness of breath: Secondary | ICD-10-CM | POA: Diagnosis not present

## 2018-06-07 ENCOUNTER — Encounter: Payer: Self-pay | Admitting: Family Medicine

## 2018-06-13 ENCOUNTER — Telehealth: Payer: Self-pay | Admitting: Family Medicine

## 2018-06-13 NOTE — Telephone Encounter (Signed)
Please let the patient know that I received a copy of his sleep study.  He does appear to have sleep apnea.  They recommended a CPAP titration.  This would require him to go to a sleep center to have a CPAP titration completed.  Please see if he is willing to do this.  I would prefer that he have a CPAP titration completed.  Though if he is not we can get him set up with an AutoPap.  Thanks.

## 2018-06-17 NOTE — Telephone Encounter (Signed)
Noted.  Please find out when he received the CPAP machine.  Was this recently or is this a machine he has had for some time?

## 2018-06-17 NOTE — Telephone Encounter (Signed)
Called and spoke to patient.  Patient said that he already has a CPAP machine that he got from [place] but he doesn't use it because he cannot afford the cleaning machine and also he says that it is set on 14 which blows too strong for him.

## 2018-06-18 NOTE — Telephone Encounter (Signed)
Called patient.  No answer.  LMTCB.

## 2018-06-20 NOTE — Telephone Encounter (Signed)
Patient calling back and stated that he has had the machine for some time. Pt states that he has no been using machine because he can not afford to have it cleaned. Patient states that he may have had this machine for 5 years. Pt would like a call back from [facility] regarding.

## 2018-06-24 NOTE — Telephone Encounter (Signed)
Form signed for AutoPap.  Please fax to [place].

## 2018-06-28 NOTE — Telephone Encounter (Signed)
Fax done.  Harlow Carrizales,cma

## 2018-07-03 ENCOUNTER — Telehealth: Payer: Self-pay | Admitting: *Deleted

## 2018-07-03 NOTE — Telephone Encounter (Signed)
Left a message on voicemail informing the patient that his form for CPAP was faxed to [place] and I gave him their phone number to check on the status.  Zyere Jiminez,cma

## 2018-07-03 NOTE — Telephone Encounter (Signed)
Copied from [place] [id number]. Topic: General - Call Back - No Documentation >> Jul 03, 2018  8:40 AM Erick Blinks wrote: Reason for CRM: Pt asked for status update on sleep study. Please advise  Best Contact: [phone] VM okay

## 2018-07-11 ENCOUNTER — Ambulatory Visit: Payer: Self-pay | Admitting: Pharmacist

## 2018-07-11 DIAGNOSIS — I10 Essential (primary) hypertension: Secondary | ICD-10-CM

## 2018-07-11 DIAGNOSIS — F329 Major depressive disorder, single episode, unspecified: Secondary | ICD-10-CM

## 2018-07-11 DIAGNOSIS — E785 Hyperlipidemia, unspecified: Secondary | ICD-10-CM

## 2018-07-11 DIAGNOSIS — IMO0002 Reserved for concepts with insufficient information to code with codable children: Secondary | ICD-10-CM

## 2018-07-11 DIAGNOSIS — E1140 Type 2 diabetes mellitus with diabetic neuropathy, unspecified: Secondary | ICD-10-CM

## 2018-07-11 DIAGNOSIS — F419 Anxiety disorder, unspecified: Secondary | ICD-10-CM

## 2018-07-11 NOTE — Progress Notes (Signed)
  Chronic Care Management   Note  07/11/2018 Name: ABIGAIL MARSIGLIA MRN: [id number] DOB: [birth year]  Contacted patient to discuss chronic care management services. Left HIPAA compliant message for patient to return my call if he would like; otherwise, I will plan to outreach in 3-5 weeks for continued medication management support.   Catie Darnelle Maffucci, PharmD, [place] Pharmacist [facility] [phone]

## 2018-07-12 ENCOUNTER — Ambulatory Visit: Payer: Self-pay | Admitting: Pharmacist

## 2018-07-12 NOTE — Progress Notes (Signed)
  Care Management Note   Shane Jones is a 73 y.o. year old male who is a primary care patient of Caryl Bis, Angela Adam, MD. The CM team was consulted for assistance with chronic disease management and care coordination.   I reached out to Irean Hong by phone today. We talked about how pleased he is that my pharmacy services will continue at [facility]   [name] was given information about Chronic Care Management services today including:  1. CCM service includes personalized support from designated clinical staff supervised by his physician, including individualized plan of care and coordination with other care providers 2. 24/7 contact phone numbers for assistance for urgent and routine care needs. 3. Service will only be billed when office clinical staff spend 20 minutes or more in a month to coordinate care. 4. Only one practitioner may furnish and bill the service in a calendar month. 5. The patient may stop CCM services at any time (effective at the end of the month) by phone call to the office staff. 6. The patient will be responsible for cost sharing (co-pay) of up to 20% of the service fee (after annual deductible is met).  Patient agreed to services and verbal consent obtained.    Review of patient status, including review of consultants reports, relevant laboratory and other test results, and collaboration with appropriate care team members and the patient's provider was performed as part of comprehensive patient evaluation and provision of chronic care management services.   Follow Up Plan:  - Will outreach patient in the next 4-6 weeks for continued medication management.   Catie Darnelle Maffucci, PharmD, [place] Pharmacist [facility] [phone]

## 2018-07-12 NOTE — Patient Instructions (Signed)
Visit Information  Mr. Sliter was given information about Chronic Care Management services today including:  1. CCM service includes personalized support from designated clinical staff supervised by his physician, including individualized plan of care and coordination with other care providers 2. 24/7 contact phone numbers for assistance for urgent and routine care needs. 3. Service will only be billed when office clinical staff spend 20 minutes or more in a month to coordinate care. 4. Only one practitioner may furnish and bill the service in a calendar month. 5. The patient may stop CCM services at any time (effective at the end of the month) by phone call to the office staff. 6. The patient will be responsible for cost sharing (co-pay) of up to 20% of the service fee (after annual deductible is met).  Patient agreed to services and verbal consent obtained.   The patient verbalized understanding of instructions provided today and declined a print copy of patient instruction materials.   Follow Up Plan:  - Will outreach patient in the next 4-6 weeks for continued medication management.   Catie Travis, PharmD, CPP Clinical Pharmacist [facility] [place] [facility] [phone]  

## 2018-07-15 DIAGNOSIS — I129 Hypertensive chronic kidney disease with stage 1 through stage 4 chronic kidney disease, or unspecified chronic kidney disease: Secondary | ICD-10-CM | POA: Diagnosis not present

## 2018-07-15 DIAGNOSIS — R809 Proteinuria, unspecified: Secondary | ICD-10-CM | POA: Diagnosis not present

## 2018-07-15 DIAGNOSIS — M109 Gout, unspecified: Secondary | ICD-10-CM | POA: Diagnosis not present

## 2018-07-15 DIAGNOSIS — E1122 Type 2 diabetes mellitus with diabetic chronic kidney disease: Secondary | ICD-10-CM | POA: Diagnosis not present

## 2018-07-15 DIAGNOSIS — N183 Chronic kidney disease, stage 3 (moderate): Secondary | ICD-10-CM | POA: Diagnosis not present

## 2018-07-22 DIAGNOSIS — R809 Proteinuria, unspecified: Secondary | ICD-10-CM | POA: Diagnosis not present

## 2018-07-22 DIAGNOSIS — I129 Hypertensive chronic kidney disease with stage 1 through stage 4 chronic kidney disease, or unspecified chronic kidney disease: Secondary | ICD-10-CM | POA: Diagnosis not present

## 2018-07-22 DIAGNOSIS — N183 Chronic kidney disease, stage 3 (moderate): Secondary | ICD-10-CM | POA: Diagnosis not present

## 2018-07-22 DIAGNOSIS — E1122 Type 2 diabetes mellitus with diabetic chronic kidney disease: Secondary | ICD-10-CM | POA: Diagnosis not present

## 2018-08-05 ENCOUNTER — Ambulatory Visit: Payer: Medicare Other | Admitting: Pharmacist

## 2018-08-05 DIAGNOSIS — I10 Essential (primary) hypertension: Secondary | ICD-10-CM

## 2018-08-05 DIAGNOSIS — E785 Hyperlipidemia, unspecified: Secondary | ICD-10-CM

## 2018-08-05 DIAGNOSIS — E1140 Type 2 diabetes mellitus with diabetic neuropathy, unspecified: Secondary | ICD-10-CM

## 2018-08-05 DIAGNOSIS — IMO0002 Reserved for concepts with insufficient information to code with codable children: Secondary | ICD-10-CM

## 2018-08-05 NOTE — Chronic Care Management (AMB) (Signed)
Chronic Care Management   Note  08/05/2018 Name: Shane Jones MRN: [id number] DOB: 1945/03/13   Subjective:  Shane Jones is a 73 y.o. year old male who is a primary care patient of Shane Jones, Shane Adam, MD. The CCM team was consulted for assistance with chronic disease management and care coordination needs.    Contacted patient telephonically to review medication management. Reviewed refill history, patient has not filled some of his medications recently, but he notes that he has a stock pile of medications and that's why he hasn't filled everything recently. Reports that he uses a pill box daily and does not forget any doses.   Notes that his wife recently moved in with their daughter, as she and her husband could more easily take care of her, had room for a hospital bed, are closer to her physicians, etc. Patient is still unemployed.  Review of patient status, including review of consultants reports, laboratory and other test data, was performed as part of comprehensive evaluation and provision of chronic care management services.   Objective:  Lab Results  Component Value Date   CREATININE 1.44 01/21/2018   CREATININE 1.44 03/19/2017   CREATININE 1.21 10/30/2016    Lab Results  Component Value Date   HGBA1C 7.7 (A) 06/03/2018       Component Value Date/Time   CHOL 85 01/21/2018 1420   TRIG 204.0 (H) 01/21/2018 1420   HDL 30.10 (L) 01/21/2018 1420   CHOLHDL 3 01/21/2018 1420   VLDL 40.8 (H) 01/21/2018 1420   LDLCALC 23 08/07/2016 1156   LDLDIRECT 35.0 01/21/2018 1420    Clinical ASCVD: No    BP Readings from Last 3 Encounters:  03/11/18 (!) 150/109  02/18/18 (!) 145/98  01/21/18 122/88    Allergies  Allergen Reactions  . Uloric [Febuxostat] Hives    hives    Medications Reviewed Today    Reviewed by De Hollingshead, [facility] (Pharmacist) on 08/05/18 at 1050  Med List Status: <None>  Medication Order Taking? Sig Documenting Provider Last Dose Status Informant   Accu-Chek FastClix Lancets MISC [id number] Yes USE UP TO FOUR TIMES DAILY AS DIRECTED Leone Haven, MD Taking Active   ACCU-CHEK GUIDE test strip [id number] Yes USE UP TO FOUR TIMES DAILY AS DIRECTED Leone Haven, MD Taking Active   allopurinol (ZYLOPRIM) 100 MG tablet [id number] Yes TAKE 1 TABLET DAILY Leone Haven, MD Taking Active   amLODipine (NORVASC) 10 MG tablet [id number] Yes TAKE 1 TABLET EVERY DAY Leone Haven, MD Taking Active   aspirin 325 MG tablet [id number] Yes Take 325 mg by mouth daily. [provider] Taking Active   atorvastatin (LIPITOR) 40 MG tablet [id number] Yes TAKE 1 TABLET EVERY DAY Leone Haven, MD Taking Active   B Complex Vitamins (VITAMIN-B COMPLEX PO) [id number] Yes Take by mouth. [provider] Taking Active   BETA CAROTENE PO [id number] No Take by mouth. [provider] Not Taking Active   blood glucose meter kit and supplies [id number] Yes Dispense based on patient and insurance preference. Use up to four times daily as directed. (FOR ICD-10 E10.9, E11.9). Leone Haven, MD Taking Active   Calcium Carbonate-Vitamin D (CALCIUM + D PO) [id number] No Take by mouth. [provider] Not Taking Active   Dulaglutide (TRULICITY) 1.5 MH/9.6QI SOPN [id number] Yes Inject 1 pen into the skin once a week. Leone Haven, MD Taking Active   empagliflozin (JARDIANCE) 25 MG TABS tablet [id number] Yes Take 25 mg  by mouth daily. Leone Haven, MD Taking Active   escitalopram (LEXAPRO) 10 MG tablet [id number] Yes TAKE 1 TABLET(10 MG) BY MOUTH DAILY Leone Haven, MD Taking Active   furosemide (LASIX) 20 MG tablet [id number] Yes TAKE 1 TABLET (20 MG TOTAL) DAILY Leone Haven, MD Taking Active   gabapentin (NEURONTIN) 400 MG capsule [id number] Yes TAKE 1 CAPSULE(400 MG) BY MOUTH THREE TIMES DAILY Leone Haven, MD Taking Active            Med Note De Hollingshead   Mon May 06, 2018 10:28 AM) Taking 1-2  QAM, 1-2 QPM  glipiZIDE (GLUCOTROL) 5 MG tablet [id number] Yes Take 1 tablet (5 mg total) by mouth 2 (two) times daily before a meal. Leone Haven, MD Taking Active   losartan (COZAAR) 100 MG tablet [id number] Yes TAKE 1 TABLET EVERY DAY Leone Haven, MD Taking Active   magnesium 30 MG tablet [id number] No Take 30 mg by mouth 2 (two) times daily. [provider] Not Taking Active   metFORMIN (GLUCOPHAGE) 1000 MG tablet [id number] Yes TAKE 1 TABLET BY MOUTH TWICE DAILY, WITH MEALS Leone Haven, MD Taking Active   metoprolol succinate (TOPROL-XL) 100 MG 24 hr tablet [id number] Yes TAKE 1 TABLET EVERY DAY Leone Haven, MD Taking Active   Multiple Vitamin (MULTIVITAMIN) tablet [id number] Yes Take 1 tablet by mouth daily. [provider] Taking Active   pantoprazole (PROTONIX) 40 MG tablet [id number] Yes Take 1 tablet (40 mg total) by mouth daily. Leone Haven, MD Taking Active   potassium chloride (K-DUR) 10 MEQ tablet [id number] Yes TAKE 1 TABLET BY MOUTH DAILY WITH LASIX Leone Haven, MD Taking Active   Zinc Sulfate (ZINC 15 PO) [id number] Yes Take by mouth. [provider] Taking Active            Assessment:   Goals Addressed            This Visit's Progress     Patient Stated   . "I want to keep my diabetes under control" (pt-stated)       Current Barriers:  . Diabetes: uncontrolled; most recent A1c 7.7%  . Current antihyperglycemic regimen: metformin 1000 mg BID, glipizide 5 mg BID, Jardiance 25 mg QAM, Trulicity 1.5 mg once weekly . Reports nocturia 3x/night; notes that he takes 3 long naps per day;  . Current meal patterns: o Breakfast: sometimes skipping; eggs; occasionally cereal . Current exercise: Nothing  . Current blood glucose readings: Fasting: ~120-150s; Evening: <170  . Cardiovascular risk reduction: o Current hypertensive regimen: losartan 100 mg, metoprolol XL 100 mg, amlodipine 10 mg daily; BP at home  130-140/80s o Current hyperlipidemia regimen: atorvastatin 40 mg, LDL <70 on last check; taking ASA 325 mg daily    Pharmacist Clinical Goal(s):  Marland Kitchen Over the next 90 days, patient with work with PharmD and primary care provider to address optimized medication management  Interventions: . Comprehensive medication review performed . Encouraged patient on his continued commitment to controlling his blood sugars. Recommended continued glucose checks BID to keep an eye on fasting and post prandial sugars  . Encouraged to continue to check blood pressures . Will continue current regimen. Due for A1c with next appointment with Dr. Caryl Jones  Patient Self Care Activities:  . Patient will check blood glucose BID, document, and provide at future appointments . Patient will focus on medication adherence by continuing to use his pill box  . Patient will take medications  as prescribed . Patient will report any questions or concerns to provider   Initial goal documentation        Plan: - Appointment scheduled in 6 weeks for continued medication management support.   Catie Darnelle Maffucci, PharmD, [place] Pharmacist [facility] [place] [phone]

## 2018-08-05 NOTE — Patient Instructions (Addendum)
Visit Information  Goals Addressed            This Visit's Progress     Patient Stated   . "I want to keep my diabetes under control" (pt-stated)       Current Barriers:  . Diabetes: uncontrolled; most recent A1c 7.7%  . Current antihyperglycemic regimen: metformin 1000 mg BID, glipizide 5 mg BID, Jardiance 25 mg QAM, Trulicity 1.5 mg once weekly . Reports nocturia 3x/night; notes that he takes 3 long naps per day;  . Current meal patterns: o Breakfast: sometimes skipping; eggs; occasionally cereal . Current exercise: Nothing  . Current blood glucose readings: Fasting: ~120-150s; Evening: <170  . Cardiovascular risk reduction: o Current hypertensive regimen: losartan 100 mg, metoprolol XL 100 mg, amlodipine 10 mg daily; BP at home 130-140/80s o Current hyperlipidemia regimen: atorvastatin 40 mg, LDL <70 on last check; taking ASA 325 mg daily    Pharmacist Clinical Goal(s):  Marland Kitchen Over the next 90 days, patient with work with PharmD and primary care provider to address optimized medication management  Interventions: . Comprehensive medication review performed . Encouraged patient on his continued commitment to controlling his blood sugars. Recommended continued glucose checks BID to keep an eye on fasting and post prandial sugars  . Encouraged to continue to check blood pressures . D/t no hx CVA, recommended patient reduce ASA to 81 mg daily for primary prevention. Patient verbalized understanding.  . Will continue current regimen. Due for A1c with next appointment with Dr. Caryl Bis  Patient Self Care Activities:  . Patient will check blood glucose BID, document, and provide at future appointments . Patient will focus on medication adherence by continuing to use his pill box  . Patient will take medications as prescribed . Patient will report any questions or concerns to provider   Initial goal documentation        The patient verbalized understanding of instructions  provided today and declined a print copy of patient instruction materials.   Plan: - Appointment scheduled in 6 weeks for continued medication management support.   Catie Darnelle Maffucci, PharmD, [place] Pharmacist [facility] [place] [phone]

## 2018-08-19 ENCOUNTER — Other Ambulatory Visit: Payer: Self-pay | Admitting: Family Medicine

## 2018-09-16 ENCOUNTER — Encounter: Payer: Self-pay | Admitting: Family Medicine

## 2018-09-16 ENCOUNTER — Ambulatory Visit (INDEPENDENT_AMBULATORY_CARE_PROVIDER_SITE_OTHER): Payer: Medicare Other | Admitting: Family Medicine

## 2018-09-16 ENCOUNTER — Other Ambulatory Visit: Payer: Self-pay

## 2018-09-16 VITALS — Ht 66.0 in | Wt 182.0 lb

## 2018-09-16 DIAGNOSIS — E1165 Type 2 diabetes mellitus with hyperglycemia: Secondary | ICD-10-CM

## 2018-09-16 DIAGNOSIS — I10 Essential (primary) hypertension: Secondary | ICD-10-CM

## 2018-09-16 DIAGNOSIS — F419 Anxiety disorder, unspecified: Secondary | ICD-10-CM

## 2018-09-16 DIAGNOSIS — F329 Major depressive disorder, single episode, unspecified: Secondary | ICD-10-CM | POA: Diagnosis not present

## 2018-09-16 DIAGNOSIS — E1140 Type 2 diabetes mellitus with diabetic neuropathy, unspecified: Secondary | ICD-10-CM

## 2018-09-16 DIAGNOSIS — IMO0002 Reserved for concepts with insufficient information to code with codable children: Secondary | ICD-10-CM

## 2018-09-16 NOTE — Assessment & Plan Note (Signed)
Patient with grief and some mild anxiety in the setting of his wife passing away.  Offered support.  Advised that he could undergo grief counseling through hospice if he desired at some point in the future.  He will continue to monitor.

## 2018-09-16 NOTE — Assessment & Plan Note (Signed)
Plan for patient to come in for lab work.  Continue current regimen.

## 2018-09-16 NOTE — Assessment & Plan Note (Signed)
Decent control given his age.  He will continue his current regimen and follow-up with clinical pharmacist in 2 weeks.

## 2018-09-16 NOTE — Progress Notes (Signed)
Virtual Visit via video Note  This visit type was conducted due to national recommendations for restrictions regarding the COVID-19 pandemic (e.g. social distancing).  This format is felt to be most appropriate for this patient at this time.  All issues noted in this document were discussed and addressed.  No physical exam was performed (except for noted visual exam findings with Video Visits).   I connected with Shane Jones today at  1:15 PM EDT by a video enabled telemedicine application and verified that I am speaking with the correct person using two identifiers. Location patient: brother's house  Location provider: work  Persons participating in the virtual visit: patient, provider  I discussed the limitations, risks, security and privacy concerns of performing an evaluation and management service by telephone and the availability of in person appointments. I also discussed with the patient that there may be a patient responsible charge related to this service. The patient expressed understanding and agreed to proceed.   Reason for visit: follow-up  HPI: DIABETES Disease Monitoring: Blood Sugar ranges-patient has not been checking his glucose over the last week or so as his wife passed away. Polyuria/phagia/dipsia-no      Optho-needs to see Medications: Compliance-taking Trulicity, Jardiance, glipizide, metformin .   Hypoglycemic symptoms-no  HYPERTENSION  Disease Monitoring  Home BP Monitoring less than 140/90. Chest pain-now.    Dyspnea-no. Medications  Compliance-taking amlodipine, Lasix, losartan, metoprolol.  Edema-no.  Anxiety/depression: Patient notes mild anxiety though this is related to his wife passing away.  He does feel some grief though no depression.  He feels relieved for her as she was ill and she is no longer in pain.  He does have quite a bit of support from surrounding family members in the area.  He continues on Lexapro.   ROS: See pertinent positives and  negatives per HPI.  Past Medical History:  Diagnosis Date  . Diabetes mellitus without complication ([place])   . Gout   . Hypertension   . Sleep apnea    has CPAP, doesn't use  . Wears dentures    full upper and lower    Past Surgical History:  Procedure Laterality Date  . BALLOON DILATION N/A 10/22/2017   Procedure: BALLOON DILATION;  Surgeon: Lucilla Lame, MD;  Location: [place];  Service: Endoscopy;  Laterality: N/A;  . ESOPHAGOGASTRODUODENOSCOPY (EGD) WITH PROPOFOL N/A 10/22/2017   Procedure: ESOPHAGOGASTRODUODENOSCOPY (EGD) WITH Biopsy;  Surgeon: Lucilla Lame, MD;  Location: [place];  Service: Endoscopy;  Laterality: N/A;  . TONSILLECTOMY      No family history on file.  SOCIAL HX: Former smoker.   Current Outpatient Medications:  .  Accu-Chek FastClix Lancets MISC, USE UP TO FOUR TIMES DAILY AS DIRECTED, Disp: 102 each, Rfl: 2 .  ACCU-CHEK GUIDE test strip, USE UP TO FOUR TIMES DAILY AS DIRECTED, Disp: 100 each, Rfl: 2 .  allopurinol (ZYLOPRIM) 100 MG tablet, TAKE 1 TABLET DAILY, Disp: 90 tablet, Rfl: 03 .  amLODipine (NORVASC) 10 MG tablet, TAKE 1 TABLET EVERY DAY, Disp: 90 tablet, Rfl: 2 .  aspirin 325 MG tablet, Take 325 mg by mouth daily., Disp: , Rfl:  .  atorvastatin (LIPITOR) 40 MG tablet, TAKE 1 TABLET EVERY DAY, Disp: 90 tablet, Rfl: 3 .  B Complex Vitamins (VITAMIN-B COMPLEX PO), Take by mouth., Disp: , Rfl:  .  BETA CAROTENE PO, Take by mouth., Disp: , Rfl:  .  blood glucose meter kit and supplies, Dispense based on patient and insurance  preference. Use up to four times daily as directed. (FOR ICD-10 E10.9, E11.9)., Disp: 1 each, Rfl: 0 .  Calcium Carbonate-Vitamin D (CALCIUM + D PO), Take by mouth., Disp: , Rfl:  .  Dulaglutide (TRULICITY) 1.5 JA/2.5KN SOPN, Inject 1 pen into the skin once a week., Disp: 4 pen, Rfl: 0 .  empagliflozin (JARDIANCE) 25 MG TABS tablet, Take 25 mg by mouth daily., Disp: 90 tablet, Rfl: 3 .  escitalopram  (LEXAPRO) 10 MG tablet, TAKE 1 TABLET(10 MG) BY MOUTH DAILY, Disp: 90 tablet, Rfl: 0 .  furosemide (LASIX) 20 MG tablet, TAKE 1 TABLET (20 MG TOTAL) DAILY, Disp: 90 tablet, Rfl: 0 .  gabapentin (NEURONTIN) 400 MG capsule, TAKE 1 CAPSULE THREE TIMES DAILY, Disp: 270 capsule, Rfl: 0 .  glipiZIDE (GLUCOTROL) 5 MG tablet, Take 1 tablet (5 mg total) by mouth 2 (two) times daily before a meal., Disp: 180 tablet, Rfl: 3 .  losartan (COZAAR) 100 MG tablet, TAKE 1 TABLET EVERY DAY, Disp: 90 tablet, Rfl: 2 .  magnesium 30 MG tablet, Take 30 mg by mouth 2 (two) times daily., Disp: , Rfl:  .  metFORMIN (GLUCOPHAGE) 1000 MG tablet, TAKE 1 TABLET BY MOUTH TWICE DAILY, WITH MEALS, Disp: 180 tablet, Rfl: 3 .  metoprolol succinate (TOPROL-XL) 100 MG 24 hr tablet, TAKE 1 TABLET EVERY DAY, Disp: 90 tablet, Rfl: 1 .  Multiple Vitamin (MULTIVITAMIN) tablet, Take 1 tablet by mouth daily., Disp: , Rfl:  .  pantoprazole (PROTONIX) 40 MG tablet, Take 1 tablet (40 mg total) by mouth daily., Disp: 90 tablet, Rfl: 1 .  potassium chloride (K-DUR) 10 MEQ tablet, TAKE 1 TABLET BY MOUTH DAILY WITH LASIX, Disp: 90 tablet, Rfl: 0 .  potassium chloride (K-DUR) 10 MEQ tablet, TAKE 1 TABLET BY MOUTH DAILY WITH LASIX, Disp: 90 tablet, Rfl: 0 .  Zinc Sulfate (ZINC 15 PO), Take by mouth., Disp: , Rfl:   EXAM:  VITALS per patient if applicable: None.  GENERAL: alert, oriented, appears well and in no acute distress  HEENT: atraumatic, conjunttiva clear, no obvious abnormalities on inspection of external nose and ears  NECK: normal movements of the head and neck  LUNGS: on inspection no signs of respiratory distress, breathing rate appears normal, no obvious gross SOB, gasping or wheezing  CV: no obvious cyanosis  MS: moves all visible extremities without noticeable abnormality  PSYCH/NEURO: pleasant and cooperative, no obvious depression or anxiety, speech and thought processing grossly intact  ASSESSMENT AND PLAN:   Discussed the following assessment and plan:  Hypertension Decent control given his age.  He will continue his current regimen and follow-up with clinical pharmacist in 2 weeks.  Type 2 diabetes, uncontrolled, with neuropathy ([place]) Plan for patient to come in for lab work.  Continue current regimen.  Anxiety and depression Patient with grief and some mild anxiety in the setting of his wife passing away.  Offered support.  Advised that he could undergo grief counseling through hospice if he desired at some point in the future.  He will continue to monitor.   Social distancing precautions and sick precautions given regarding COVID-19.   I discussed the assessment and treatment plan with the patient. The patient was provided an opportunity to ask questions and all were answered. The patient agreed with the plan and demonstrated an understanding of the instructions.   The patient was advised to call back or seek an in-person evaluation if the symptoms worsen or if the condition fails to improve as anticipated.  [name], MD

## 2018-09-20 ENCOUNTER — Telehealth: Payer: Self-pay

## 2018-09-20 NOTE — Telephone Encounter (Signed)
Copied from [place] [phone]. Topic: General - Other >> Sep 20, 2018 11:47 AM Keene Breath wrote: Reason for CRM: Patient called to speak with Lorriane Shire.  Patient did not want to elaborate why he needed to speak with her.  Called office and she was not available.  Please call patient back at [phone]

## 2018-09-23 NOTE — Telephone Encounter (Signed)
Returned patient call. Needing documentation of physicals for he and his wife.

## 2018-09-25 ENCOUNTER — Other Ambulatory Visit: Payer: Self-pay

## 2018-09-25 ENCOUNTER — Other Ambulatory Visit (INDEPENDENT_AMBULATORY_CARE_PROVIDER_SITE_OTHER): Payer: Medicare Other

## 2018-09-25 DIAGNOSIS — IMO0002 Reserved for concepts with insufficient information to code with codable children: Secondary | ICD-10-CM

## 2018-09-25 DIAGNOSIS — E1165 Type 2 diabetes mellitus with hyperglycemia: Secondary | ICD-10-CM | POA: Diagnosis not present

## 2018-09-25 DIAGNOSIS — E1140 Type 2 diabetes mellitus with diabetic neuropathy, unspecified: Secondary | ICD-10-CM

## 2018-09-25 LAB — COMPREHENSIVE METABOLIC PANEL
ALT: 19 U/L (ref 0–53)
AST: 18 U/L (ref 0–37)
Albumin: 4.5 g/dL (ref 3.5–5.2)
Alkaline Phosphatase: 67 U/L (ref 39–117)
BUN: 19 mg/dL (ref 6–23)
CO2: 32 mEq/L (ref 19–32)
Calcium: 10 mg/dL (ref 8.4–10.5)
Chloride: 99 mEq/L (ref 96–112)
Creatinine, Ser: 1.23 mg/dL (ref 0.40–1.50)
GFR: 57.29 mL/min — ABNORMAL LOW (ref >60.00)
Glucose, Bld: 174 mg/dL — ABNORMAL HIGH (ref 70–99)
Potassium: 3.2 mEq/L — ABNORMAL LOW (ref 3.5–5.1)
Sodium: 141 mEq/L (ref 135–145)
Total Bilirubin: 0.7 mg/dL (ref 0.2–1.2)
Total Protein: 7.1 g/dL (ref 6.0–8.3)

## 2018-09-25 LAB — LDL CHOLESTEROL, DIRECT: Direct LDL: 61 mg/dL

## 2018-09-25 LAB — HEMOGLOBIN A1C: Hgb A1c MFr Bld: 8.2 % — ABNORMAL HIGH (ref 4.6–6.5)

## 2018-09-28 ENCOUNTER — Other Ambulatory Visit: Payer: Self-pay | Admitting: Family Medicine

## 2018-09-28 DIAGNOSIS — E876 Hypokalemia: Secondary | ICD-10-CM

## 2018-09-30 ENCOUNTER — Telehealth: Payer: Medicare Other

## 2018-09-30 ENCOUNTER — Ambulatory Visit: Payer: Self-pay | Admitting: Pharmacist

## 2018-09-30 NOTE — Chronic Care Management (AMB) (Signed)
  Chronic Care Management   Note  09/30/2018 Name: Shane Jones MRN: [id number] DOB: 10/12/1945  Shane Jones is a 73 y.o. year old male who is a primary care patient of Caryl Bis, Angela Adam, MD. The CCM team was consulted for assistance with chronic disease management and care coordination needs.    Attempted to contact patient to discuss chronic medication management. Left HIPAA compliant message for patient to return my call at his convenience.   Follow up plan: - If I do not hear back, will attempt outreach again in the next ~4 weeks  Catie Darnelle Maffucci, PharmD, [place] Pharmacist [place] [phone]

## 2018-10-03 ENCOUNTER — Other Ambulatory Visit: Payer: Medicare Other

## 2018-10-03 ENCOUNTER — Ambulatory Visit (INDEPENDENT_AMBULATORY_CARE_PROVIDER_SITE_OTHER): Payer: Medicare Other | Admitting: Pharmacist

## 2018-10-03 DIAGNOSIS — E1165 Type 2 diabetes mellitus with hyperglycemia: Secondary | ICD-10-CM

## 2018-10-03 DIAGNOSIS — F329 Major depressive disorder, single episode, unspecified: Secondary | ICD-10-CM | POA: Diagnosis not present

## 2018-10-03 DIAGNOSIS — F419 Anxiety disorder, unspecified: Secondary | ICD-10-CM

## 2018-10-03 DIAGNOSIS — E1140 Type 2 diabetes mellitus with diabetic neuropathy, unspecified: Secondary | ICD-10-CM

## 2018-10-03 DIAGNOSIS — IMO0002 Reserved for concepts with insufficient information to code with codable children: Secondary | ICD-10-CM

## 2018-10-03 NOTE — Chronic Care Management (AMB) (Signed)
Chronic Care Management   Follow Up Note   10/03/2018 Name: JAVONTAY VANDAM MRN: [id number] DOB: 1945/01/12  Referred by: Leone Haven, MD Reason for referral : Chronic Care Management (Medication Management)   WILBURT MESSINA is a 73 y.o. year old male who is a primary care patient of Caryl Bis, Angela Adam, MD. The CCM team was consulted for assistance with chronic disease management and care coordination needs.    Received return phone call from patient today.   Review of patient status, including review of consultants reports, relevant laboratory and other test results, and collaboration with appropriate care team members and the patient's provider was performed as part of comprehensive patient evaluation and provision of chronic care management services.    SDOH (Social Determinants of Health) screening performed today: Depression  . See Care Plan for related entries.   Advanced Directives Status: N See Care Plan and Vynca application for related entries.  Outpatient Encounter Medications as of 10/03/2018  Medication Sig  . Accu-Chek FastClix Lancets MISC USE UP TO FOUR TIMES DAILY AS DIRECTED  . ACCU-CHEK GUIDE test strip USE UP TO FOUR TIMES DAILY AS DIRECTED  . allopurinol (ZYLOPRIM) 100 MG tablet TAKE 1 TABLET DAILY  . amLODipine (NORVASC) 10 MG tablet TAKE 1 TABLET EVERY DAY  . aspirin 325 MG tablet Take 325 mg by mouth daily.  Marland Kitchen atorvastatin (LIPITOR) 40 MG tablet TAKE 1 TABLET EVERY DAY  . B Complex Vitamins (VITAMIN-B COMPLEX PO) Take by mouth.  Marland Kitchen BETA CAROTENE PO Take by mouth.  . blood glucose meter kit and supplies Dispense based on patient and insurance preference. Use up to four times daily as directed. (FOR ICD-10 E10.9, E11.9).  . Calcium Carbonate-Vitamin D (CALCIUM + D PO) Take by mouth.  . Dulaglutide (TRULICITY) 1.5 VX/4.8AX SOPN Inject 1 pen into the skin once a week.  . empagliflozin (JARDIANCE) 25 MG TABS tablet Take 25 mg by mouth daily.  Marland Kitchen escitalopram  (LEXAPRO) 10 MG tablet TAKE 1 TABLET(10 MG) BY MOUTH DAILY  . furosemide (LASIX) 20 MG tablet TAKE 1 TABLET (20 MG TOTAL) DAILY  . gabapentin (NEURONTIN) 400 MG capsule TAKE 1 CAPSULE THREE TIMES DAILY  . glipiZIDE (GLUCOTROL) 5 MG tablet Take 1 tablet (5 mg total) by mouth 2 (two) times daily before a meal.  . losartan (COZAAR) 100 MG tablet TAKE 1 TABLET EVERY DAY  . magnesium 30 MG tablet Take 30 mg by mouth 2 (two) times daily.  . metFORMIN (GLUCOPHAGE) 1000 MG tablet TAKE 1 TABLET BY MOUTH TWICE DAILY, WITH MEALS  . metoprolol succinate (TOPROL-XL) 100 MG 24 hr tablet TAKE 1 TABLET EVERY DAY  . Multiple Vitamin (MULTIVITAMIN) tablet Take 1 tablet by mouth daily.  . pantoprazole (PROTONIX) 40 MG tablet Take 1 tablet (40 mg total) by mouth daily.  . potassium chloride (K-DUR) 10 MEQ tablet TAKE 1 TABLET BY MOUTH DAILY WITH LASIX  . potassium chloride (K-DUR) 10 MEQ tablet TAKE 1 TABLET BY MOUTH DAILY WITH LASIX  . Zinc Sulfate (ZINC 15 PO) Take by mouth.   No facility-administered encounter medications on file as of 10/03/2018.      Goals Addressed            This Visit's Progress     Patient Stated   . "I want to keep my diabetes under control" (pt-stated)       Current Barriers:  . Diabetes: uncontrolled; most recent A1c 8.2%  o Notes that his wife's recently illness,  hospitalization, and death impacted his ability to focus on his health . Current antihyperglycemic regimen: metformin 1000 mg BID, glipizide 5 mg BID, Jardiance 25 mg QAM, Trulicity 1.5 mg once weekly . Current exercise: Nothing  . Current blood glucose readings: Not checking recently.  . Cardiovascular risk reduction: o Current hypertensive regimen: losartan 100 mg, metoprolol XL 100 mg, amlodipine 10 mg daily; BP at home 130-140/80s o Current hyperlipidemia regimen: atorvastatin 40 mg, LDL <70 on last check; taking ASA 325 mg daily    Pharmacist Clinical Goal(s):  Marland Kitchen Over the next 90 days, patient with work  with PharmD and primary care provider to address optimized medication management  Interventions: . Employed empathetic listening while patient reminisced about his wife and his marriage. They were married for 51 years. Patient notes that he is doing as well as he could be doing.  . Encouraged continued medication adherence. Scheduled phone call appointment later this month for medication management. Patient requested I mail upcoming appointments to him.   Patient Self Care Activities:  . Patient will check blood glucose BID, document, and provide at future appointments . Patient will focus on medication adherence by continuing to use his pill box  . Patient will take medications as prescribed . Patient will report any questions or concerns to provider   Please see past updates related to this goal by clicking on the "Past Updates" button in the selected goal          Plan:  - Will outreach patient for appointment in ~3 weeks.   Catie Darnelle Maffucci, PharmD, [place] Pharmacist [facility] [place] [phone]

## 2018-10-03 NOTE — Addendum Note (Signed)
Addended by: Leeanne Rio on: 10/03/2018 04:41 PM   Modules accepted: Orders

## 2018-10-03 NOTE — Patient Instructions (Addendum)
Shane Jones,   It was great taking to you today! You and Rusty keep up the great work, and we'll talk on Monday the 26th. Plan to have blood sugars and blood pressure readings ready to review, as well as your medications.   Catie   Visit Information  Goals Addressed            This Visit's Progress     Patient Stated   . "I want to keep my diabetes under control" (pt-stated)       Current Barriers:  . Diabetes: uncontrolled; most recent A1c 8.2%  o Notes that his wife's recently illness, hospitalization, and death impacted his ability to focus on his health . Current antihyperglycemic regimen: metformin 1000 mg BID, glipizide 5 mg BID, Jardiance 25 mg QAM, Trulicity 1.5 mg once weekly . Current exercise: Nothing  . Current blood glucose readings: Not checking recently.  . Cardiovascular risk reduction: o Current hypertensive regimen: losartan 100 mg, metoprolol XL 100 mg, amlodipine 10 mg daily; BP at home 130-140/80s o Current hyperlipidemia regimen: atorvastatin 40 mg, LDL <70 on last check; taking ASA 325 mg daily    Pharmacist Clinical Goal(s):  Marland Kitchen Over the next 90 days, patient with work with PharmD and primary care provider to address optimized medication management  Interventions: . Employed empathetic listening while patient reminisced about his wife and his marriage. They were married for 51 years. Patient notes that he is doing as well as he could be doing.  . Encouraged continued medication adherence. Scheduled phone call appointment later this month for medication management. Patient requested I mail upcoming appointments to him.   Patient Self Care Activities:  . Patient will check blood glucose BID, document, and provide at future appointments . Patient will focus on medication adherence by continuing to use his pill box  . Patient will take medications as prescribed . Patient will report any questions or concerns to provider   Please see past updates related to  this goal by clicking on the "Past Updates" button in the selected goal         Print copy of patient instructions provided.    Plan:  - Will outreach patient for appointment in ~3 weeks.   Catie Darnelle Maffucci, PharmD, [place] Pharmacist [facility] [phone]

## 2018-10-04 ENCOUNTER — Other Ambulatory Visit: Payer: Medicare Other

## 2018-10-04 DIAGNOSIS — G4733 Obstructive sleep apnea (adult) (pediatric): Secondary | ICD-10-CM | POA: Diagnosis not present

## 2018-10-04 DIAGNOSIS — R0602 Shortness of breath: Secondary | ICD-10-CM | POA: Diagnosis not present

## 2018-10-05 DIAGNOSIS — R0602 Shortness of breath: Secondary | ICD-10-CM | POA: Diagnosis not present

## 2018-10-05 DIAGNOSIS — G4733 Obstructive sleep apnea (adult) (pediatric): Secondary | ICD-10-CM | POA: Diagnosis not present

## 2018-10-07 ENCOUNTER — Telehealth: Payer: Self-pay | Admitting: Pharmacist

## 2018-10-07 ENCOUNTER — Other Ambulatory Visit: Payer: Self-pay

## 2018-10-07 ENCOUNTER — Other Ambulatory Visit (INDEPENDENT_AMBULATORY_CARE_PROVIDER_SITE_OTHER): Payer: Medicare Other

## 2018-10-07 DIAGNOSIS — E876 Hypokalemia: Secondary | ICD-10-CM

## 2018-10-07 NOTE — Telephone Encounter (Signed)
Patient was here for labs. He wanted to speak to you. You were on the phone, could you please call him.

## 2018-10-07 NOTE — Telephone Encounter (Signed)
Called patient back. He had just wanted to say hello.

## 2018-10-08 ENCOUNTER — Encounter: Payer: Self-pay | Admitting: Family Medicine

## 2018-10-08 LAB — BASIC METABOLIC PANEL
BUN: 13 mg/dL (ref 7–25)
CO2: 31 mmol/L (ref 20–32)
Calcium: 9.5 mg/dL (ref 8.6–10.3)
Chloride: 101 mmol/L (ref 98–110)
Creat: 1.15 mg/dL (ref 0.70–1.18)
Glucose, Bld: 317 mg/dL — ABNORMAL HIGH (ref 65–99)
Potassium: 3.9 mmol/L (ref 3.5–5.3)
Sodium: 141 mmol/L (ref 135–146)

## 2018-10-10 ENCOUNTER — Telehealth: Payer: Self-pay

## 2018-10-10 NOTE — Telephone Encounter (Signed)
Copied from [place] [id number]. Topic: Quick Communication - Lab Results (Clinic Use ONLY) >> Oct 10, 2018 10:10 AM Pauline Good wrote: Pt want results of his lab

## 2018-10-10 NOTE — Telephone Encounter (Signed)
I called and lm on voicemail informing the patient that a result letter was mailed to his home but if he wanted to he could call back and we could give him his results.  Alonnah Lampkins,cma

## 2018-10-11 ENCOUNTER — Other Ambulatory Visit: Payer: Self-pay | Admitting: Family Medicine

## 2018-10-28 ENCOUNTER — Ambulatory Visit: Payer: Medicare Other | Admitting: Pharmacist

## 2018-10-28 DIAGNOSIS — E1140 Type 2 diabetes mellitus with diabetic neuropathy, unspecified: Secondary | ICD-10-CM

## 2018-10-28 DIAGNOSIS — F32A Depression, unspecified: Secondary | ICD-10-CM

## 2018-10-28 DIAGNOSIS — IMO0002 Reserved for concepts with insufficient information to code with codable children: Secondary | ICD-10-CM

## 2018-10-28 DIAGNOSIS — F329 Major depressive disorder, single episode, unspecified: Secondary | ICD-10-CM

## 2018-10-28 NOTE — Chronic Care Management (AMB) (Signed)
Chronic Care Management   Follow Up Note   10/28/2018 Name: Shane Jones MRN: [id number] DOB: [birth year]  Referred by: Leone Haven, MD Reason for referral : Chronic Care Management (Medication Management)   Shane Jones is a 73 y.o. year old male who is a primary care patient of Caryl Bis, Angela Adam, MD. The CCM team was consulted for assistance with chronic disease management and care coordination needs.    Contacted patient for medication management review today.   Review of patient status, including review of consultants reports, relevant laboratory and other test results, and collaboration with appropriate care team members and the patient's provider was performed as part of comprehensive patient evaluation and provision of chronic care management services.    SDOH (Social Determinants of Health) screening performed today: Depression  . See Care Plan for related entries.   Advanced Directives Status: N See Care Plan and Vynca application for related entries.  Outpatient Encounter Medications as of 10/28/2018  Medication Sig  . Accu-Chek FastClix Lancets MISC USE UP TO FOUR TIMES DAILY AS DIRECTED  . ACCU-CHEK GUIDE test strip USE UP TO FOUR TIMES DAILY AS DIRECTED  . allopurinol (ZYLOPRIM) 100 MG tablet TAKE 1 TABLET DAILY  . amLODipine (NORVASC) 10 MG tablet TAKE 1 TABLET EVERY DAY  . aspirin 325 MG tablet Take 325 mg by mouth daily.  Marland Kitchen atorvastatin (LIPITOR) 40 MG tablet TAKE 1 TABLET EVERY DAY  . B Complex Vitamins (VITAMIN-B COMPLEX PO) Take by mouth.  Marland Kitchen BETA CAROTENE PO Take by mouth.  . Calcium Carbonate-Vitamin D (CALCIUM + D PO) Take by mouth.  . Dulaglutide (TRULICITY) 1.5 PJ/0.3PR SOPN Inject 1 pen into the skin once a week.  . empagliflozin (JARDIANCE) 25 MG TABS tablet Take 25 mg by mouth daily.  Marland Kitchen escitalopram (LEXAPRO) 10 MG tablet TAKE 1 TABLET(10 MG) BY MOUTH DAILY  . furosemide (LASIX) 20 MG tablet TAKE 1 TABLET (20 MG TOTAL) DAILY  . gabapentin  (NEURONTIN) 400 MG capsule TAKE 1 CAPSULE(400 MG) BY MOUTH THREE TIMES DAILY  . glipiZIDE (GLUCOTROL) 5 MG tablet Take 1 tablet (5 mg total) by mouth 2 (two) times daily before a meal.  . losartan (COZAAR) 100 MG tablet TAKE 1 TABLET EVERY DAY  . magnesium 30 MG tablet Take 30 mg by mouth 2 (two) times daily.  . metFORMIN (GLUCOPHAGE) 1000 MG tablet TAKE 1 TABLET BY MOUTH TWICE DAILY, WITH MEALS  . metoprolol succinate (TOPROL-XL) 100 MG 24 hr tablet TAKE 1 TABLET EVERY DAY  . Multiple Vitamin (MULTIVITAMIN) tablet Take 1 tablet by mouth daily.  . pantoprazole (PROTONIX) 40 MG tablet Take 1 tablet (40 mg total) by mouth daily.  . potassium chloride (K-DUR) 10 MEQ tablet TAKE 1 TABLET BY MOUTH DAILY WITH LASIX  . Zinc Sulfate (ZINC 15 PO) Take by mouth.  . blood glucose meter kit and supplies Dispense based on patient and insurance preference. Use up to four times daily as directed. (FOR ICD-10 E10.9, E11.9). (Patient not taking: Reported on 10/28/2018)  . [DISCONTINUED] potassium chloride (K-DUR) 10 MEQ tablet TAKE 1 TABLET BY MOUTH DAILY WITH LASIX   No facility-administered encounter medications on file as of 10/28/2018.      Goals Addressed            This Visit's Progress     Patient Stated   . "I want to keep my diabetes under control" (pt-stated)       Current Barriers:  . Diabetes: uncontrolled; most recent  A1c 8.2%  o Today, notes that his dog, Rusty, was run over last week. This comes about 2 months after his wife passed away. He notes that he has been continuing to grieve, and is "just trying to put one foot in front of the other" o Notes that he spends 2-3 days a week with his daughter's family, 1-2 days with his son's. Denies concerning depressive thoughts.  . Current antihyperglycemic regimen: metformin 1000 mg BID, glipizide 5 mg BID, Jardiance 25 mg QAM, Trulicity 1.5 mg once weekly; does confirm adherence . Current exercise: Nothing  . Patient does report worsening of  neuropathy. He is prescribed gabapentin 400 mg TID, however, has been taking gabapentin 800 mg BID (4 capsules daily instead of 3). Notes that this has improved neuropathy. Denies significant sedation. . Current blood glucose readings: Not checking recently.  . Cardiovascular risk reduction: o Current hypertensive regimen: losartan 100 mg, metoprolol XL 100 mg, amlodipine 10 mg daily; BP at home 130-140/80s o Current hyperlipidemia regimen: atorvastatin 40 mg, LDL <70 on last check; taking ASA 325 mg daily    Pharmacist Clinical Goal(s):  Marland Kitchen Over the next 90 days, patient with work with PharmD and primary care provider to address optimized medication management  Interventions: . Comprehensive medication review performed; medication list updated in electronic medical record. Refill dates are appropriate and suggest adherence.  . Provided empathetic listening and support. Offered referral to LCSW for grief counseling; patient decline at this time.  . Encouraged patient to start checking blood sugars. Discussed that we may need to consider insulin therapy, given last glucose on BMP >300. 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 Of note, patient is not working right now and isn't sure if he wants to go back to driving.  . Last CrCl ~56 mL/min using adjusted body weight; gabapentin 800 mg BID is appropriate, if this is controlling patient's neuropathy. Will collaborate w/ Dr. Caryl Bis for an updated prescription, if appropriate.  Patient Self Care Activities:  . Patient will check blood glucose BID, document, and provide at future appointments . Patient will focus on medication adherence by continuing to use his pill box  . Patient will take medications as prescribed . Patient will report any questions or concerns to provider   Please see past updates related to this goal by clicking on the "Past Updates" button in the selected goal           Plan: - Scheduled follow up phone call in 4 weeks for continued medication management support  Catie Darnelle Maffucci, PharmD, [place] Pharmacist [place] [facility] [phone]

## 2018-10-28 NOTE — Patient Instructions (Signed)
Visit Information  Goals Addressed            This Visit's Progress     Patient Stated   . "I want to keep my diabetes under control" (pt-stated)       Current Barriers:  . Diabetes: uncontrolled; most recent A1c 8.2%  o Today, notes that his dog, Rusty, was run over last week. This comes about 2 months after his wife passed away. He notes that he has been continuing to grieve, and is "just trying to put one foot in front of the other" o Notes that he spends 2-3 days a week with his daughter's family, 1-2 days with his son's. Denies concerning depressive thoughts.  . Current antihyperglycemic regimen: metformin 1000 mg BID, glipizide 5 mg BID, Jardiance 25 mg QAM, Trulicity 1.5 mg once weekly; does confirm adherence . Current exercise: Nothing  . Patient does report worsening of neuropathy. He is prescribed gabapentin 400 mg TID, however, has been taking gabapentin 800 mg BID (4 capsules daily instead of 3). Notes that this has improved neuropathy. Denies significant sedation. . Current blood glucose readings: Not checking recently.  . Cardiovascular risk reduction: o Current hypertensive regimen: losartan 100 mg, metoprolol XL 100 mg, amlodipine 10 mg daily; BP at home 130-140/80s o Current hyperlipidemia regimen: atorvastatin 40 mg, LDL <70 on last check; taking ASA 325 mg daily    Pharmacist Clinical Goal(s):  Marland Kitchen Over the next 90 days, patient with work with PharmD and primary care provider to address optimized medication management  Interventions: . Comprehensive medication review performed; medication list updated in electronic medical record. Refill dates are appropriate and suggest adherence.  . Provided empathetic listening and support. Offered referral to LCSW for grief counseling; patient decline at this time.  . Encouraged patient to start checking blood sugars. Discussed that we may need to consider insulin therapy, given last glucose on BMP >300. 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 Of note, patient is not working right now and isn't sure if he wants to go back to driving.  . Last CrCl ~56 mL/min using adjusted body weight; gabapentin 800 mg BID is appropriate, if this is controlling patient's neuropathy. Will collaborate w/ Dr. Caryl Bis for an updated prescription, if appropriate.  Patient Self Care Activities:  . Patient will check blood glucose BID, document, and provide at future appointments . Patient will focus on medication adherence by continuing to use his pill box  . Patient will take medications as prescribed . Patient will report any questions or concerns to provider   Please see past updates related to this goal by clicking on the "Past Updates" button in the selected goal         The patient verbalized understanding of instructions provided today and declined a print copy of patient instruction materials.   Plan: - Scheduled follow up phone call in 4 weeks for continued medication management support  Catie Darnelle Maffucci, PharmD, [place] Pharmacist [place] [phone]

## 2018-11-07 ENCOUNTER — Telehealth: Payer: Self-pay

## 2018-11-07 NOTE — Telephone Encounter (Signed)
Copied from [place] [id number]. Topic: General - Call Back - No Documentation >> Nov 07, 2018  3:26 PM Erick Blinks wrote: Reason for CRM: Has questions regarding shingles vaccine, please advise. Seeking advice from Clinical staff  Best contact: [phone]

## 2018-11-08 NOTE — Telephone Encounter (Signed)
Lm on voicemail to call back to answer is question.  Raffael Bugarin,cma

## 2018-11-25 ENCOUNTER — Ambulatory Visit (INDEPENDENT_AMBULATORY_CARE_PROVIDER_SITE_OTHER): Payer: Medicare Other | Admitting: Pharmacist

## 2018-11-25 DIAGNOSIS — E1165 Type 2 diabetes mellitus with hyperglycemia: Secondary | ICD-10-CM

## 2018-11-25 DIAGNOSIS — F419 Anxiety disorder, unspecified: Secondary | ICD-10-CM

## 2018-11-25 DIAGNOSIS — F329 Major depressive disorder, single episode, unspecified: Secondary | ICD-10-CM

## 2018-11-25 DIAGNOSIS — E1140 Type 2 diabetes mellitus with diabetic neuropathy, unspecified: Secondary | ICD-10-CM

## 2018-11-25 DIAGNOSIS — IMO0002 Reserved for concepts with insufficient information to code with codable children: Secondary | ICD-10-CM

## 2018-11-25 DIAGNOSIS — Z23 Encounter for immunization: Secondary | ICD-10-CM | POA: Diagnosis not present

## 2018-11-25 NOTE — Progress Notes (Signed)
Reviewed.  Agree with plan   Dr Djon Tith 

## 2018-11-25 NOTE — Chronic Care Management (AMB) (Signed)
Chronic Care Management   Follow Up Note   11/25/2018 Name: Shane Jones MRN: [id number] DOB: [birth year]  Referred by: Leone Haven, MD Reason for referral : Chronic Care Management (Medication Management)   Shane Jones is a 73 y.o. year old male who is a primary care patient of Caryl Bis, Angela Adam, MD. The CCM team was consulted for assistance with chronic disease management and care coordination needs.    Contacted patient for medication management review and support.   Review of patient status, including review of consultants reports, relevant laboratory and other test results, and collaboration with appropriate care team members and the patient's provider was performed as part of comprehensive patient evaluation and provision of chronic care management services.    SDOH (Social Determinants of Health) screening performed today: Financial Strain  Depression  . See Care Plan for related entries.   Outpatient Encounter Medications as of 11/25/2018  Medication Sig  . Dulaglutide (TRULICITY) 1.5 TK/1.6WF SOPN Inject 1 pen into the skin once a week.  . empagliflozin (JARDIANCE) 25 MG TABS tablet Take 25 mg by mouth daily.  Marland Kitchen glipiZIDE (GLUCOTROL) 5 MG tablet Take 1 tablet (5 mg total) by mouth 2 (two) times daily before a meal.  . metFORMIN (GLUCOPHAGE) 1000 MG tablet TAKE 1 TABLET BY MOUTH TWICE DAILY, WITH MEALS  . Accu-Chek FastClix Lancets MISC USE UP TO FOUR TIMES DAILY AS DIRECTED  . ACCU-CHEK GUIDE test strip USE UP TO FOUR TIMES DAILY AS DIRECTED  . allopurinol (ZYLOPRIM) 100 MG tablet TAKE 1 TABLET DAILY  . amLODipine (NORVASC) 10 MG tablet TAKE 1 TABLET EVERY DAY  . aspirin 325 MG tablet Take 325 mg by mouth daily.  Marland Kitchen atorvastatin (LIPITOR) 40 MG tablet TAKE 1 TABLET EVERY DAY  . B Complex Vitamins (VITAMIN-B COMPLEX PO) Take by mouth.  Marland Kitchen BETA CAROTENE PO Take by mouth.  . blood glucose meter kit and supplies Dispense based on patient and insurance preference. Use up  to four times daily as directed. (FOR ICD-10 E10.9, E11.9). (Patient not taking: Reported on 10/28/2018)  . Calcium Carbonate-Vitamin D (CALCIUM + D PO) Take by mouth.  . escitalopram (LEXAPRO) 10 MG tablet TAKE 1 TABLET(10 MG) BY MOUTH DAILY  . furosemide (LASIX) 20 MG tablet TAKE 1 TABLET (20 MG TOTAL) DAILY  . gabapentin (NEURONTIN) 400 MG capsule TAKE 1 CAPSULE(400 MG) BY MOUTH THREE TIMES DAILY  . losartan (COZAAR) 100 MG tablet TAKE 1 TABLET EVERY DAY  . magnesium 30 MG tablet Take 30 mg by mouth 2 (two) times daily.  . metoprolol succinate (TOPROL-XL) 100 MG 24 hr tablet TAKE 1 TABLET EVERY DAY  . Multiple Vitamin (MULTIVITAMIN) tablet Take 1 tablet by mouth daily.  . pantoprazole (PROTONIX) 40 MG tablet Take 1 tablet (40 mg total) by mouth daily.  . potassium chloride (K-DUR) 10 MEQ tablet TAKE 1 TABLET BY MOUTH DAILY WITH LASIX  . Zinc Sulfate (ZINC 15 PO) Take by mouth.   No facility-administered encounter medications on file as of 11/25/2018.      Goals Addressed            This Visit's Progress     Patient Stated   . "I want to keep my diabetes under control" (pt-stated)       Current Barriers:  . Diabetes: uncontrolled; most recent A1c 8.2%  o Still struggling with the loss of his wife and his dog o Bought a new dog last Friday; Shadow. This has helped him  immensely.  o Notes that he still has good days and bad days. Cries on and off during our call today.  . Current antihyperglycemic regimen: metformin 1000 mg BID, glipizide 5 mg BID, Jardiance 25 mg QAM, Trulicity 1.5 mg once weekly . Current exercise: Nothing  . Current blood glucose readings:  o Random: readings 140s-250s . Cardiovascular risk reduction: o Current hypertensive regimen: losartan 100 mg, metoprolol XL 100 mg, amlodipine 10 mg daily; BP at home 130-140/80s o Current hyperlipidemia regimen: atorvastatin 40 mg, LDL <70 on last check; taking ASA 325 mg daily    Pharmacist Clinical Goal(s):  Marland Kitchen Over  the next 90 days, patient with work with PharmD and primary care provider to address optimized medication management  Interventions: . Provided empathetic listening to support patient regarding his grief. Denies SI/HI. Praised him for finding methods of self-care, including spending time with his new dog and taking him on walks. Reminded of offer for LCSW referral. Patient declined at this time. . Encouraged continued adherence to medications.  . Extensive dietary education and support provided prior to the holidays.  . Encouraged to check BG over the next few weeks. Scheduled phone call prior to his appt w/ PCP  . Discussed the option for increasing Trulicity to 3 mg; will discuss at next appointment. Will discuss plans for reapplying for Jardiance and Trulicity patient assistance for 2021.   Patient Self Care Activities:  . Patient will check blood glucose BID, document, and provide at future appointments . Patient will focus on medication adherence by continuing to use his pill box  . Patient will take medications as prescribed . Patient will report any questions or concerns to provider   Please see past updates related to this goal by clicking on the "Past Updates" button in the selected goal          Plan: - Will outreach in ~3 weeks for continued medication management support  Catie Darnelle Maffucci, PharmD, [place] Pharmacist [facility] [phone]

## 2018-11-25 NOTE — Patient Instructions (Signed)
Visit Information  Goals Addressed            This Visit's Progress     Patient Stated   . "I want to keep my diabetes under control" (pt-stated)       Current Barriers:  . Diabetes: uncontrolled; most recent A1c 8.2%  o Still struggling with the loss of his wife and his dog o Bought a new dog last Friday; Shadow. This has helped him immensely.  o Notes that he still has good days and bad days. Cries on and off during our call today.  . Current antihyperglycemic regimen: metformin 1000 mg BID, glipizide 5 mg BID, Jardiance 25 mg QAM, Trulicity 1.5 mg once weekly . Current exercise: Nothing  . Current blood glucose readings:  o Random: readings 140s-250s . Cardiovascular risk reduction: o Current hypertensive regimen: losartan 100 mg, metoprolol XL 100 mg, amlodipine 10 mg daily; BP at home 130-140/80s o Current hyperlipidemia regimen: atorvastatin 40 mg, LDL <70 on last check; taking ASA 325 mg daily    Pharmacist Clinical Goal(s):  Marland Kitchen Over the next 90 days, patient with work with PharmD and primary care provider to address optimized medication management  Interventions: . Provided empathetic listening to support patient regarding his grief. Denies SI/HI. Praised him for finding methods of self-care, including spending time with his new dog and taking him on walks. Reminded of offer for LCSW referral. Patient declined at this time. . Encouraged continued adherence to medications.  . Extensive dietary education and support provided prior to the holidays.  . Encouraged to check BG over the next few weeks. Scheduled phone call prior to his appt w/ PCP  . Discussed the option for increasing Trulicity to 3 mg; will discuss at next appointment. Will discuss plans for reapplying for Jardiance and Trulicity patient assistance for 2021.   Patient Self Care Activities:  . Patient will check blood glucose BID, document, and provide at future appointments . Patient will focus on medication  adherence by continuing to use his pill box  . Patient will take medications as prescribed . Patient will report any questions or concerns to provider   Please see past updates related to this goal by clicking on the "Past Updates" button in the selected goal         The patient verbalized understanding of instructions provided today and declined a print copy of patient instruction materials.   Plan: - Will outreach in ~3 weeks for continued medication management support  Catie Darnelle Maffucci, PharmD, [place] Pharmacist [place] [phone]

## 2018-12-13 ENCOUNTER — Telehealth: Payer: Self-pay | Admitting: Family Medicine

## 2018-12-13 NOTE — Telephone Encounter (Signed)
I called pt twice and left vm to call ofc. °

## 2018-12-16 ENCOUNTER — Ambulatory Visit: Payer: Medicare Other | Admitting: Pharmacist

## 2018-12-16 ENCOUNTER — Encounter: Payer: Self-pay | Admitting: Family Medicine

## 2018-12-16 ENCOUNTER — Ambulatory Visit: Payer: Medicare Other | Admitting: Family Medicine

## 2018-12-16 ENCOUNTER — Other Ambulatory Visit: Payer: Self-pay

## 2018-12-16 VITALS — Ht 66.0 in | Wt 178.0 lb

## 2018-12-16 DIAGNOSIS — F329 Major depressive disorder, single episode, unspecified: Secondary | ICD-10-CM

## 2018-12-16 DIAGNOSIS — E1140 Type 2 diabetes mellitus with diabetic neuropathy, unspecified: Secondary | ICD-10-CM

## 2018-12-16 DIAGNOSIS — R42 Dizziness and giddiness: Secondary | ICD-10-CM

## 2018-12-16 DIAGNOSIS — IMO0002 Reserved for concepts with insufficient information to code with codable children: Secondary | ICD-10-CM

## 2018-12-16 DIAGNOSIS — I10 Essential (primary) hypertension: Secondary | ICD-10-CM

## 2018-12-16 DIAGNOSIS — F32A Depression, unspecified: Secondary | ICD-10-CM

## 2018-12-16 DIAGNOSIS — F320 Major depressive disorder, single episode, mild: Secondary | ICD-10-CM

## 2018-12-16 DIAGNOSIS — E1165 Type 2 diabetes mellitus with hyperglycemia: Secondary | ICD-10-CM

## 2018-12-16 MED ORDER — ESCITALOPRAM OXALATE 20 MG PO TABS: ORAL_TABLET | ORAL | 1 refills | Status: DC

## 2018-12-16 MED ORDER — TRULICITY 3 MG/0.5ML ~~LOC~~ SOAJ: 3.0000 mg | SUBCUTANEOUS | 2 refills | Status: DC

## 2018-12-16 NOTE — Progress Notes (Signed)
Virtual Visit via telephone Note  This visit type was conducted due to national recommendations for restrictions regarding the COVID-19 pandemic (e.g. social distancing).  This format is felt to be most appropriate for this patient at this time.  All issues noted in this document were discussed and addressed.  No physical exam was performed (except for noted visual exam findings with Video Visits).   I connected with Shane Jones today at  1:15 PM EST by a video enabled telemedicine application or telephone and verified that I am speaking with the correct person using two identifiers. Location patient: home Location provider: work Persons participating in the virtual visit: patient, provider  I discussed the limitations, risks, security and privacy concerns of performing an evaluation and management service by telephone and the availability of in person appointments. I also discussed with the patient that there may be a patient responsible charge related to this service. The patient expressed understanding and agreed to proceed.  Interactive audio and video telecommunications were attempted between this provider and patient, however failed, due to patient having technical difficulties OR patient did not have access to video capability.  We continued and completed visit with audio only.  Reason for visit: follow-up  HPI: Depression: Patient lost his wife and his dog within a month. He then lost his car. He notes it is hard to describe how he feels. He has been crying and grieving. He does have support on weekends. He did get a new dog which has been helpful. No SI. Lexapro has been somewhat helpful.  Hypertension: Typically less than 135/85. Taking amlodipine and losartan. No chest pain or shortness of breath.  Lightheadedness: Patient notes this has been going on for some time now. He notes he does get lightheaded when he gets up though also feels as though he gets "stumbly" when he is walking.  He will stop walking and sit down and feel better. This does not occur all the time. No vertigo. No hypoglycemia. No numbness or weakness. No falls. No new medications.   ROS: See pertinent positives and negatives per HPI.  Past Medical History:  Diagnosis Date  . Diabetes mellitus without complication ([place])   . Gout   . Hypertension   . Sleep apnea    has CPAP, doesn't use  . Wears dentures    full upper and lower    Past Surgical History:  Procedure Laterality Date  . BALLOON DILATION N/A 10/22/2017   Procedure: BALLOON DILATION;  Surgeon: Lucilla Lame, MD;  Location: [place];  Service: Endoscopy;  Laterality: N/A;  . ESOPHAGOGASTRODUODENOSCOPY (EGD) WITH PROPOFOL N/A 10/22/2017   Procedure: ESOPHAGOGASTRODUODENOSCOPY (EGD) WITH Biopsy;  Surgeon: Lucilla Lame, MD;  Location: [place];  Service: Endoscopy;  Laterality: N/A;  . TONSILLECTOMY      No family history on file.  SOCIAL HX: Former smoker.   Current Outpatient Medications:  .  Accu-Chek FastClix Lancets MISC, USE UP TO FOUR TIMES DAILY AS DIRECTED, Disp: 102 each, Rfl: 2 .  ACCU-CHEK GUIDE test strip, USE UP TO FOUR TIMES DAILY AS DIRECTED, Disp: 100 each, Rfl: 2 .  allopurinol (ZYLOPRIM) 100 MG tablet, TAKE 1 TABLET DAILY, Disp: 90 tablet, Rfl: 03 .  amLODipine (NORVASC) 10 MG tablet, TAKE 1 TABLET EVERY DAY, Disp: 90 tablet, Rfl: 2 .  aspirin 325 MG tablet, Take 325 mg by mouth daily., Disp: , Rfl:  .  atorvastatin (LIPITOR) 40 MG tablet, TAKE 1 TABLET EVERY DAY, Disp: 90 tablet, Rfl:  3 .  B Complex Vitamins (VITAMIN-B COMPLEX PO), Take by mouth., Disp: , Rfl:  .  BETA CAROTENE PO, Take by mouth., Disp: , Rfl:  .  blood glucose meter kit and supplies, Dispense based on patient and insurance preference. Use up to four times daily as directed. (FOR ICD-10 E10.9, E11.9)., Disp: 1 each, Rfl: 0 .  Calcium Carbonate-Vitamin D (CALCIUM + D PO), Take by mouth., Disp: , Rfl:  .  Dulaglutide  (TRULICITY) 3 GB/1.5VV SOPN, Inject 3 mg into the skin once a week., Disp: 12 pen, Rfl: 2 .  empagliflozin (JARDIANCE) 25 MG TABS tablet, Take 25 mg by mouth daily., Disp: 90 tablet, Rfl: 3 .  escitalopram (LEXAPRO) 20 MG tablet, TAKE 1 TABLET (20 MG) BY MOUTH DAILY, Disp: 90 tablet, Rfl: 1 .  furosemide (LASIX) 20 MG tablet, TAKE 1 TABLET (20 MG TOTAL) DAILY, Disp: 90 tablet, Rfl: 0 .  gabapentin (NEURONTIN) 400 MG capsule, TAKE 1 CAPSULE(400 MG) BY MOUTH THREE TIMES DAILY, Disp: 270 capsule, Rfl: 0 .  glipiZIDE (GLUCOTROL) 5 MG tablet, Take 1 tablet (5 mg total) by mouth 2 (two) times daily before a meal., Disp: 180 tablet, Rfl: 3 .  losartan (COZAAR) 100 MG tablet, TAKE 1 TABLET EVERY DAY, Disp: 90 tablet, Rfl: 2 .  magnesium 30 MG tablet, Take 30 mg by mouth 2 (two) times daily., Disp: , Rfl:  .  metFORMIN (GLUCOPHAGE) 1000 MG tablet, TAKE 1 TABLET BY MOUTH TWICE DAILY, WITH MEALS, Disp: 180 tablet, Rfl: 3 .  metoprolol succinate (TOPROL-XL) 100 MG 24 hr tablet, TAKE 1 TABLET EVERY DAY, Disp: 90 tablet, Rfl: 1 .  Multiple Vitamin (MULTIVITAMIN) tablet, Take 1 tablet by mouth daily., Disp: , Rfl:  .  pantoprazole (PROTONIX) 40 MG tablet, Take 1 tablet (40 mg total) by mouth daily., Disp: 90 tablet, Rfl: 1 .  potassium chloride (K-DUR) 10 MEQ tablet, TAKE 1 TABLET BY MOUTH DAILY WITH LASIX, Disp: 90 tablet, Rfl: 0 .  Zinc Sulfate (ZINC 15 PO), Take by mouth., Disp: , Rfl:   EXAM: This is a telehealth telephone visit and thus no physical exam was completed.   ASSESSMENT AND PLAN:  Discussed the following assessment and plan:  Anxiety and depression Patient does have grief and likely some depression. We will increase his Lexapro. He will monitor. Follow-up in 2 months.  Hypertension Decent control given age. He will continue to monitor. Continue current regimen. He will come in for labs.  Lightheadedness Potentially could be orthostasis. He describes stumbling at times as well. We will  check lab work and have him come in for orthostatics. Determine further evaluation once those are completed.    I discussed the assessment and treatment plan with the patient. The patient was provided an opportunity to ask questions and all were answered. The patient agreed with the plan and demonstrated an understanding of the instructions.   The patient was advised to call back or seek an in-person evaluation if the symptoms worsen or if the condition fails to improve as anticipated.  I provided 21 minutes of non-face-to-face time during this encounter.   [name], MD

## 2018-12-16 NOTE — Patient Instructions (Signed)
Visit Information  Goals Addressed            This Visit's Progress     Patient Stated   . "I want to keep my diabetes under control" (pt-stated)       Current Barriers:  . Diabetes: uncontrolled; most recent A1c 8.2%  o Impacted by recent grief over loss of spouse and beloved dog  o Feels like he has been "stumbling". Gets "weird feeling", denies feeling dizzy. Finds if very difficulty to describe the sensation. Notes he can be walking along and it just comes over him. "Feels like I'm going to the ground, but I'm not". Feels like these "sensations" were more apparent on the "gloomy" days last week o Notes he was very, very afraid to be alone last week due to these sensations, so he went over to his son's house, his brother's house.  . Current antihyperglycemic regimen: metformin 1000 mg BID, glipizide 5 mg QAM, Jardiance 25 mg QAM, Trulicity 1.5 mg once weekly . Current exercise: Nothing  . Current blood glucose readings:  o Fasting: occasional 200s, up to 235; mostly <200; generally in 150s o 2 hour post prandial: has not been checking these lately  . Cardiovascular risk reduction: o Current hypertensive regimen: losartan 100 mg, metoprolol XL 100 mg, amlodipine 10 mg daily; BP at home 130-140/80s on last check  o Current hyperlipidemia regimen: atorvastatin 40 mg, LDL <70 on last check o Current antiplatelet regimen: ASA 325 mg daily    Pharmacist Clinical Goal(s):  Marland Kitchen Over the next 90 days, patient with work with PharmD and primary care provider to address optimized medication management  Interventions: . Comprehensive medication review performed, medication list updated in electronic medical record . Reviewed goal A1c, goal fasting glucose, and goal 2 hour post prandial readings . Reviewed indications for medications . Recommend to increase Trulicity 3 mg weekly. Continue metformin 1000 mg BID, Jardiance 25 mg daily, glipizide 5 mg QAM . Discussed application process for  [place] (Jardiance) and [employer] (Trulicity) for [id number]. Will collaborate w/ Susy Frizzle, CPhT to mail patient his portion of application. He will mail back his portion of application, along with 2020 income information. Will collaborate w/ Dr. Nolen Mu on his portion of application.  . Encouraged patient to discuss these "sensations" with Dr. Nolen Mu at upcoming phone visit this afternoon. Wonder if these are related to grief/depression/anxiety. Offered referral to LCSW for mental health support, patient declined, noting that he thinks he can "win this myself".   Patient Self Care Activities:  . Patient will check blood glucose BID, document, and provide at future appointments . Patient will focus on medication adherence by continuing to use his pill box  . Patient will take medications as prescribed . Patient will report any questions or concerns to provider   Please see past updates related to this goal by clicking on the "Past Updates" button in the selected goal         The patient verbalized understanding of instructions provided today and declined a print copy of patient instruction materials.    Plan:  - Scheduled f/u phone call in 5 weeks (01/19/2018 at 11 am) for continued medication management support - Will collaborate w/ patient, provider, and CPhT on medication access needs as above  Catie Darnelle Maffucci, PharmD, [place], [place] Pharmacist [place] [phone]

## 2018-12-16 NOTE — Chronic Care Management (AMB) (Signed)
Chronic Care Management   Follow Up Note   12/16/2018 Name: GARO HEIDELBERG MRN: [id number] DOB: [birth year]  Referred by: Leone Haven, MD Reason for referral : Chronic Care Management (Medication Management)   CHRISTOPHERJOHN SCHIELE is a 73 y.o. year old male who is a primary care patient of Caryl Bis, Angela Adam, MD. The CCM team was consulted for assistance with chronic disease management and care coordination needs.    Contacted patient for medication management review.   Review of patient status, including review of consultants reports, relevant laboratory and other test results, and collaboration with appropriate care team members and the patient's provider was performed as part of comprehensive patient evaluation and provision of chronic care management services.    SDOH (Social Determinants of Health) screening performed today: Financial Strain  Depression  . See Care Plan for related entries.   Outpatient Encounter Medications as of 12/16/2018  Medication Sig Note  . Accu-Chek FastClix Lancets MISC USE UP TO FOUR TIMES DAILY AS DIRECTED   . ACCU-CHEK GUIDE test strip USE UP TO FOUR TIMES DAILY AS DIRECTED   . allopurinol (ZYLOPRIM) 100 MG tablet TAKE 1 TABLET DAILY   . amLODipine (NORVASC) 10 MG tablet TAKE 1 TABLET EVERY DAY   . aspirin 325 MG tablet Take 325 mg by mouth daily.   Marland Kitchen atorvastatin (LIPITOR) 40 MG tablet TAKE 1 TABLET EVERY DAY   . B Complex Vitamins (VITAMIN-B COMPLEX PO) Take by mouth.   . blood glucose meter kit and supplies Dispense based on patient and insurance preference. Use up to four times daily as directed. (FOR ICD-10 E10.9, E11.9).   . Calcium Carbonate-Vitamin D (CALCIUM + D PO) Take by mouth.   . empagliflozin (JARDIANCE) 25 MG TABS tablet Take 25 mg by mouth daily.   Marland Kitchen escitalopram (LEXAPRO) 10 MG tablet TAKE 1 TABLET(10 MG) BY MOUTH DAILY   . furosemide (LASIX) 20 MG tablet TAKE 1 TABLET (20 MG TOTAL) DAILY   . gabapentin (NEURONTIN) 400 MG capsule TAKE  1 CAPSULE(400 MG) BY MOUTH THREE TIMES DAILY 12/16/2018: Taking 800 mg QPM  . glipiZIDE (GLUCOTROL) 5 MG tablet Take 1 tablet (5 mg total) by mouth 2 (two) times daily before a meal. 12/16/2018: Taking QAM   . losartan (COZAAR) 100 MG tablet TAKE 1 TABLET EVERY DAY   . magnesium 30 MG tablet Take 30 mg by mouth 2 (two) times daily.   . metFORMIN (GLUCOPHAGE) 1000 MG tablet TAKE 1 TABLET BY MOUTH TWICE DAILY, WITH MEALS   . metoprolol succinate (TOPROL-XL) 100 MG 24 hr tablet TAKE 1 TABLET EVERY DAY   . Multiple Vitamin (MULTIVITAMIN) tablet Take 1 tablet by mouth daily.   . pantoprazole (PROTONIX) 40 MG tablet Take 1 tablet (40 mg total) by mouth daily.   . potassium chloride (K-DUR) 10 MEQ tablet TAKE 1 TABLET BY MOUTH DAILY WITH LASIX   . Zinc Sulfate (ZINC 15 PO) Take by mouth.   . [DISCONTINUED] Dulaglutide (TRULICITY) 1.5 PY/0.9XI SOPN Inject 1 pen into the skin once a week.   Marland Kitchen BETA CAROTENE PO Take by mouth.   . Dulaglutide (TRULICITY) 3 PJ/8.2NK SOPN Inject 3 mg into the skin once a week.    No facility-administered encounter medications on file as of 12/16/2018.     Goals Addressed            This Visit's Progress     Patient Stated   . "I want to keep my diabetes under control" (pt-stated)  Current Barriers:  . Diabetes: uncontrolled; most recent A1c 8.2%  o Impacted by recent grief over loss of spouse and beloved dog  o Feels like he has been "stumbling". Gets "weird feeling", denies feeling dizzy. Finds if very difficulty to describe the sensation. Notes he can be walking along and it just comes over him. "Feels like I'm going to the ground, but I'm not". Feels like these "sensations" were more apparent on the "gloomy" days last week o Notes he was very, very afraid to be alone last week due to these sensations, so he went over to his son's house, his brother's house.  . Current antihyperglycemic regimen: metformin 1000 mg BID, glipizide 5 mg QAM, Jardiance 25 mg QAM,  Trulicity 1.5 mg once weekly . Current exercise: Nothing  . Current blood glucose readings:  o Fasting: occasional 200s, up to 235; mostly <200; generally in 150s o 2 hour post prandial: has not been checking these lately  . Cardiovascular risk reduction: o Current hypertensive regimen: losartan 100 mg, metoprolol XL 100 mg, amlodipine 10 mg daily; BP at home 130-140/80s on last check  o Current hyperlipidemia regimen: atorvastatin 40 mg, LDL <70 on last check o Current antiplatelet regimen: ASA 325 mg daily    Pharmacist Clinical Goal(s):  Marland Kitchen Over the next 90 days, patient with work with PharmD and primary care provider to address optimized medication management  Interventions: . Comprehensive medication review performed, medication list updated in electronic medical record . Reviewed goal A1c, goal fasting glucose, and goal 2 hour post prandial readings . Reviewed indications for medications . Recommend to increase Trulicity 3 mg weekly. Continue metformin 1000 mg BID, Jardiance 25 mg daily, glipizide 5 mg QAM . Discussed application process for [place] (Jardiance) and [employer] (Trulicity) for [id number]. Will collaborate w/ Susy Frizzle, CPhT to mail patient his portion of application. He will mail back his portion of application, along with 2020 income information. Will collaborate w/ Dr. Nolen Mu on his portion of application.  . Encouraged patient to discuss these "sensations" with Dr. Nolen Mu at upcoming phone visit this afternoon. Wonder if these are related to grief/depression/anxiety. Offered referral to LCSW for mental health support, patient declined, noting that he thinks he can "win this myself".   Patient Self Care Activities:  . Patient will check blood glucose BID, document, and provide at future appointments . Patient will focus on medication adherence by continuing to use his pill box  . Patient will take medications as prescribed . Patient will report any  questions or concerns to provider   Please see past updates related to this goal by clicking on the "Past Updates" button in the selected goal          Plan:  - Scheduled f/u phone call in 5 weeks (01/19/2018 at 11 am) for continued medication management support - Will collaborate w/ patient, provider, and CPhT on medication access needs as above  Catie Darnelle Maffucci, PharmD, [place], [employer] Pharmacist [place] [phone]

## 2018-12-17 ENCOUNTER — Telehealth: Payer: Self-pay | Admitting: Family Medicine

## 2018-12-17 ENCOUNTER — Other Ambulatory Visit: Payer: Self-pay | Admitting: Pharmacy Technician

## 2018-12-17 NOTE — Telephone Encounter (Signed)
I called pt and left a vm to call ofc to schedule Return in about 3 days (around 12/19/2018) for labs and nurse BP check for orthostatics, 2 months with pcp.

## 2018-12-17 NOTE — Patient Outreach (Signed)
[facility]) Care Management  12/17/2018  Shane Jones 08-15-1945 [id number]                                        Medication Assistance Referral  Referral From: [employer] RPh Catie T.   Medication/Company: [name] Patient application portion:  Mailed Provider application portion:  N/A Embedded pharmacist had signed in clinic to Dr. [name] Provider address/fax verified via: Call to office  Medication/Company: [place] [place] / [place] Patient application portion:  Mailed Provider application portion:  N/A Embedded pharmacist had signed in clinic to Dr. [name] Provider address/fax verified via: Call to office    Follow up:  Will follow up with patient in 20-30 business days to confirm application(s) have been received.  [name] P. Kelli Egolf, [place] Management [phone]

## 2018-12-18 ENCOUNTER — Ambulatory Visit (INDEPENDENT_AMBULATORY_CARE_PROVIDER_SITE_OTHER): Payer: Medicare Other | Admitting: Pharmacist

## 2018-12-18 DIAGNOSIS — E1140 Type 2 diabetes mellitus with diabetic neuropathy, unspecified: Secondary | ICD-10-CM | POA: Diagnosis not present

## 2018-12-18 DIAGNOSIS — E1165 Type 2 diabetes mellitus with hyperglycemia: Secondary | ICD-10-CM

## 2018-12-18 DIAGNOSIS — IMO0002 Reserved for concepts with insufficient information to code with codable children: Secondary | ICD-10-CM

## 2018-12-18 NOTE — Patient Instructions (Signed)
Visit Information  Goals Addressed            This Visit's Progress     Patient Stated   . "I want to keep my diabetes under control" (pt-stated)       Current Barriers:  . Diabetes: uncontrolled; most recent A1c 8.2%  o Due to reapply for [employer] and [employer] assistance for 2021 . Current antihyperglycemic regimen: metformin 1000 mg BID, glipizide 5 mg QAM, Jardiance 25 mg QAM, Trulicity 1.5 mg once weekly o Receives Jardiance through [place] [facility] through [id number] o Receives Trulicity through [place] [employer] through 01/02/2019 . Cardiovascular risk reduction: o Current hypertensive regimen: losartan 100 mg, metoprolol XL 100 mg, amlodipine 10 mg daily; BP at home 130-140/80s on last check  o Current hyperlipidemia regimen: atorvastatin 40 mg, LDL <70 on last check o Current antiplatelet regimen: ASA 325 mg daily    Pharmacist Clinical Goal(s):  Marland Kitchen Over the next 90 days, patient with work with PharmD and primary care provider to address optimized medication management  Interventions: . Received message from [facility], CPhT that patient needs to submit a copy of his [facility] prescription card. Contacted patient, informed him of this, and that it would be written in the packet of information being mailed from [employer]. He verbalized understanding.   Patient Self Care Activities:  . Patient will check blood glucose BID, document, and provide at future appointments . Patient will focus on medication adherence by continuing to use his pill box  . Patient will take medications as prescribed . Patient will report any questions or concerns to provider   Please see past updates related to this goal by clicking on the "Past Updates" button in the selected goal         The patient verbalized understanding of instructions provided today and declined a print copy of patient instruction materials.    Plan: - Will continue to collaborate w/ Susy Frizzle, CPhT as above  Catie Darnelle Maffucci, PharmD,  [place], CPP Clinical Pharmacist [place] [phone]

## 2018-12-18 NOTE — Chronic Care Management (AMB) (Signed)
Chronic Care Management   Follow Up Note   12/18/2018 Name: Shane Jones MRN: [id number] DOB: Jan 22, 1945  Referred by: Leone Haven, MD Reason for referral : Chronic Care Management (Medication Management)   Shane Jones is a 73 y.o. year old male who is a primary care patient of Caryl Bis, Angela Adam, MD. The CCM team was consulted for assistance with chronic disease management and care coordination needs.    Contacted patient for care coordination needs.   Review of patient status, including review of consultants reports, relevant laboratory and other test results, and collaboration with appropriate care team members and the patient's provider was performed as part of comprehensive patient evaluation and provision of chronic care management services.    SDOH (Social Determinants of Health) screening performed today: Financial Strain . See Care Plan for related entries.   Outpatient Encounter Medications as of 12/18/2018  Medication Sig Note  . Accu-Chek FastClix Lancets MISC USE UP TO FOUR TIMES DAILY AS DIRECTED   . ACCU-CHEK GUIDE test strip USE UP TO FOUR TIMES DAILY AS DIRECTED   . allopurinol (ZYLOPRIM) 100 MG tablet TAKE 1 TABLET DAILY   . amLODipine (NORVASC) 10 MG tablet TAKE 1 TABLET EVERY DAY   . aspirin 325 MG tablet Take 325 mg by mouth daily.   Marland Kitchen atorvastatin (LIPITOR) 40 MG tablet TAKE 1 TABLET EVERY DAY   . B Complex Vitamins (VITAMIN-B COMPLEX PO) Take by mouth.   Marland Kitchen BETA CAROTENE PO Take by mouth.   . blood glucose meter kit and supplies Dispense based on patient and insurance preference. Use up to four times daily as directed. (FOR ICD-10 E10.9, E11.9).   . Calcium Carbonate-Vitamin D (CALCIUM + D PO) Take by mouth.   . Dulaglutide (TRULICITY) 3 XA/1.2IN SOPN Inject 3 mg into the skin once a week.   . empagliflozin (JARDIANCE) 25 MG TABS tablet Take 25 mg by mouth daily.   Marland Kitchen escitalopram (LEXAPRO) 20 MG tablet TAKE 1 TABLET (20 MG) BY MOUTH DAILY   .  furosemide (LASIX) 20 MG tablet TAKE 1 TABLET (20 MG TOTAL) DAILY   . gabapentin (NEURONTIN) 400 MG capsule TAKE 1 CAPSULE(400 MG) BY MOUTH THREE TIMES DAILY 12/16/2018: Taking 800 mg QPM  . glipiZIDE (GLUCOTROL) 5 MG tablet Take 1 tablet (5 mg total) by mouth 2 (two) times daily before a meal. 12/16/2018: Taking QAM   . losartan (COZAAR) 100 MG tablet TAKE 1 TABLET EVERY DAY   . magnesium 30 MG tablet Take 30 mg by mouth 2 (two) times daily.   . metFORMIN (GLUCOPHAGE) 1000 MG tablet TAKE 1 TABLET BY MOUTH TWICE DAILY, WITH MEALS   . metoprolol succinate (TOPROL-XL) 100 MG 24 hr tablet TAKE 1 TABLET EVERY DAY   . Multiple Vitamin (MULTIVITAMIN) tablet Take 1 tablet by mouth daily.   . pantoprazole (PROTONIX) 40 MG tablet Take 1 tablet (40 mg total) by mouth daily.   . potassium chloride (K-DUR) 10 MEQ tablet TAKE 1 TABLET BY MOUTH DAILY WITH LASIX   . Zinc Sulfate (ZINC 15 PO) Take by mouth.    No facility-administered encounter medications on file as of 12/18/2018.     Goals Addressed            This Visit's Progress     Patient Stated   . "I want to keep my diabetes under control" (pt-stated)       Current Barriers:  . Diabetes: uncontrolled; most recent A1c 8.2%  o Due to  reapply for [employer] and [employer] assistance for 2021 . Current antihyperglycemic regimen: metformin 1000 mg BID, glipizide 5 mg QAM, Jardiance 25 mg QAM, Trulicity 1.5 mg once weekly o Receives Jardiance through [place] [facility] through 34/14/4360 o Receives Trulicity through [place] P[facility] through 01/02/2019 . Cardiovascular risk reduction: o Current hypertensive regimen: losartan 100 mg, metoprolol XL 100 mg, amlodipine 10 mg daily; BP at home 130-140/80s on last check  o Current hyperlipidemia regimen: atorvastatin 40 mg, LDL <70 on last check o Current antiplatelet regimen: ASA 325 mg daily    Pharmacist Clinical Goal(s):  Marland Kitchen Over the next 90 days, patient with work with PharmD and primary care provider to address  optimized medication management  Interventions: . Received message from [facility], CPhT that patient needs to submit a copy of his [facility] prescription card. Contacted patient, informed him of this, and that it would be written in the packet of information being mailed from [employer]. He verbalized understanding.   Patient Self Care Activities:  . Patient will check blood glucose BID, document, and provide at future appointments . Patient will focus on medication adherence by continuing to use his pill box  . Patient will take medications as prescribed . Patient will report any questions or concerns to provider   Please see past updates related to this goal by clicking on the "Past Updates" button in the selected goal          Plan: - Will continue to collaborate w/ Susy Frizzle, CPhT as above  Catie Darnelle Maffucci, PharmD, [place], [place] Pharmacist [place] [phone]

## 2018-12-19 DIAGNOSIS — R42 Dizziness and giddiness: Secondary | ICD-10-CM | POA: Insufficient documentation

## 2018-12-19 NOTE — Assessment & Plan Note (Signed)
Decent control given age. He will continue to monitor. Continue current regimen. He will come in for labs.

## 2018-12-19 NOTE — Assessment & Plan Note (Signed)
Patient does have grief and likely some depression. We will increase his Lexapro. He will monitor. Follow-up in 2 months.

## 2018-12-19 NOTE — Assessment & Plan Note (Signed)
Potentially could be orthostasis. He describes stumbling at times as well. We will check lab work and have him come in for orthostatics. Determine further evaluation once those are completed.

## 2018-12-23 ENCOUNTER — Other Ambulatory Visit: Payer: Self-pay

## 2018-12-26 ENCOUNTER — Ambulatory Visit (INDEPENDENT_AMBULATORY_CARE_PROVIDER_SITE_OTHER): Payer: Medicare Other

## 2018-12-26 ENCOUNTER — Other Ambulatory Visit: Payer: Self-pay

## 2018-12-26 ENCOUNTER — Other Ambulatory Visit (INDEPENDENT_AMBULATORY_CARE_PROVIDER_SITE_OTHER): Payer: Medicare Other

## 2018-12-26 ENCOUNTER — Other Ambulatory Visit: Payer: Self-pay | Admitting: Internal Medicine

## 2018-12-26 VITALS — BP 134/84 | HR 73

## 2018-12-26 DIAGNOSIS — I10 Essential (primary) hypertension: Secondary | ICD-10-CM

## 2018-12-26 DIAGNOSIS — R42 Dizziness and giddiness: Secondary | ICD-10-CM | POA: Diagnosis not present

## 2018-12-26 DIAGNOSIS — F320 Major depressive disorder, single episode, mild: Secondary | ICD-10-CM | POA: Diagnosis not present

## 2018-12-26 LAB — COMPREHENSIVE METABOLIC PANEL
ALT: 17 U/L (ref 0–53)
AST: 20 U/L (ref 0–37)
Albumin: 4.2 g/dL (ref 3.5–5.2)
Alkaline Phosphatase: 68 U/L (ref 39–117)
BUN: 15 mg/dL (ref 6–23)
CO2: 29 mEq/L (ref 19–32)
Calcium: 9.8 mg/dL (ref 8.4–10.5)
Chloride: 103 mEq/L (ref 96–112)
Creatinine, Ser: 1.31 mg/dL (ref 0.40–1.50)
GFR: 53.23 mL/min — ABNORMAL LOW (ref >60.00)
Glucose, Bld: 223 mg/dL — ABNORMAL HIGH (ref 70–99)
Potassium: 3.3 mEq/L — ABNORMAL LOW (ref 3.5–5.1)
Sodium: 140 mEq/L (ref 135–145)
Total Bilirubin: 0.9 mg/dL (ref 0.2–1.2)
Total Protein: 6.5 g/dL (ref 6.0–8.3)

## 2018-12-26 LAB — CBC
HCT: 46.3 % (ref 39.0–52.0)
Hemoglobin: 16.2 g/dL (ref 13.0–17.0)
MCHC: 35 g/dL (ref 30.0–36.0)
MCV: 95.6 fl (ref 78.0–100.0)
Platelets: 188 10*3/uL (ref 150.0–400.0)
RBC: 4.85 Mil/uL (ref 4.22–5.81)
RDW: 13.3 % (ref 11.5–15.5)
WBC: 6.2 10*3/uL (ref 4.0–10.5)

## 2018-12-26 LAB — TSH: TSH: 1.11 u[IU]/mL (ref 0.35–4.50)

## 2018-12-26 MED ORDER — HYDRALAZINE HCL 10 MG PO TABS: 10.0000 mg | ORAL_TABLET | Freq: Two times a day (BID) | ORAL | 0 refills | Status: DC | PRN

## 2018-12-26 NOTE — Progress Notes (Signed)
See note BP check 12/26/18  Thanks Harwood

## 2018-12-26 NOTE — Progress Notes (Addendum)
Patient came in today for BP check. First reading was 140/98 & after sitting 10 minutes came down to 134/84 P 71. Patient stated that his BP is usually high & his numbers vary. He also was not able to take medication this morning due to running late. I am sending to you for review.   He is on max dose of ARB, norvasc, lasix and Toprol XL 100 mg qd  Rx hydralazine 10 mg bid prn for BP >130/>80 F/u with PCP in 1-2 months Dr. Chauncey Cruel  [place]

## 2018-12-28 ENCOUNTER — Other Ambulatory Visit: Payer: Self-pay | Admitting: Family Medicine

## 2018-12-28 ENCOUNTER — Other Ambulatory Visit: Payer: Self-pay | Admitting: Gastroenterology

## 2018-12-30 IMAGING — RF DG ESOPHAGUS
10 of 13 series · 14 of 20 positions shown · non-contrast
Comparison: Chest x-ray 02/21/2017.

CLINICAL DATA: Dysphagia.

EXAM:
ESOPHOGRAM / BARIUM SWALLOW / BARIUM TABLET STUDY
TECHNIQUE: Combined double contrast and single contrast examination performed
using effervescent crystals, thick barium liquid, and thin barium
liquid. The patient was observed with fluoroscopy swallowing a 13 mm
barium sulphate tablet.
FLUOROSCOPY TIME:  Fluoroscopy Time:  2 minutes 6 seconds
Radiation Exposure Index (if provided by the fluoroscopic device):
56.9 mGy
Number of Acquired Spot Images: 28

[Series 1: fluoro_barium 2fps_bw · 0.18mm/px · 3 of 8 frames shown (1 of 10)]
[frame 2/8]
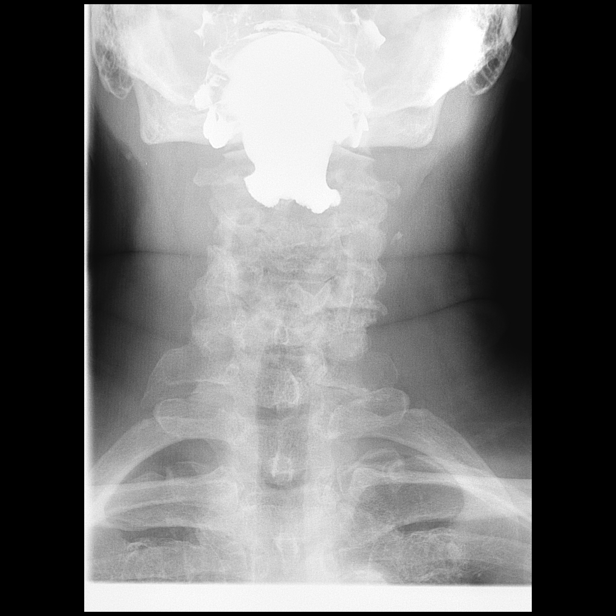
[frame 5/8]
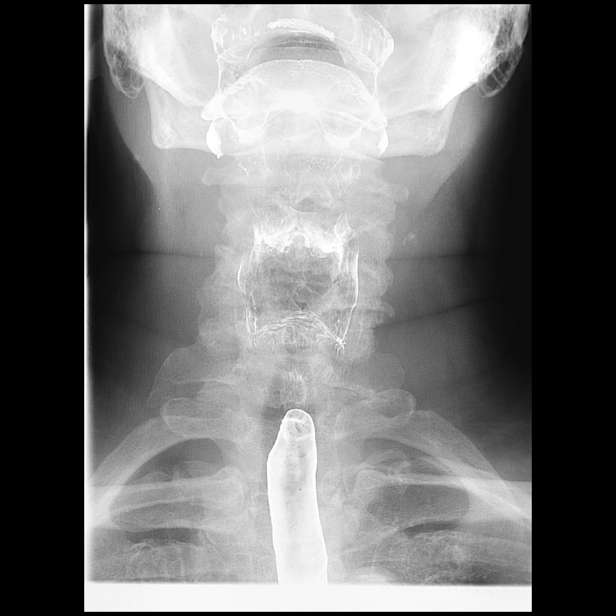
[frame 7/8]
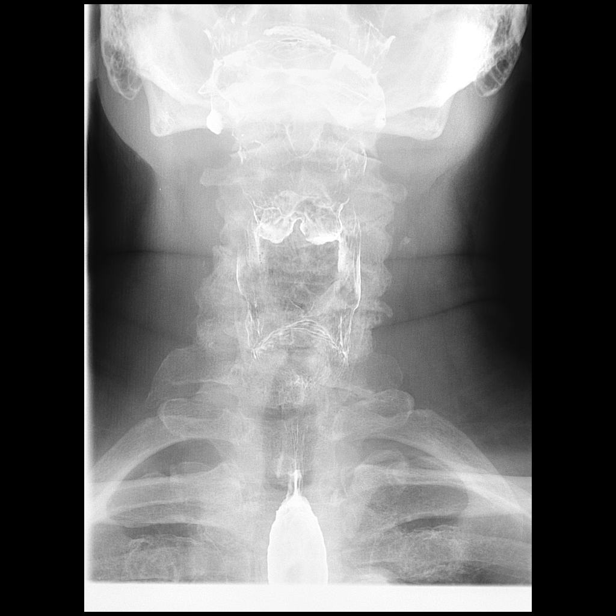

[Series 2: fluoro_barium 2fps_bw · 0.18mm/px · 3 of 8 frames shown (2 of 10)]
[frame 4/8]
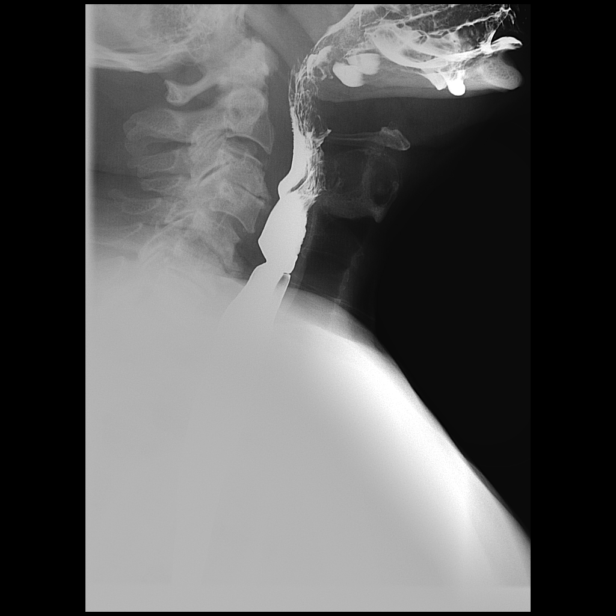
[frame 5/8]
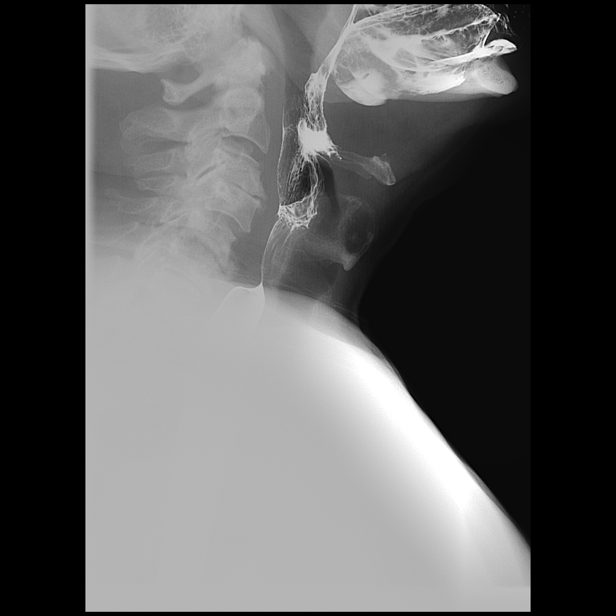
[frame 7/8]
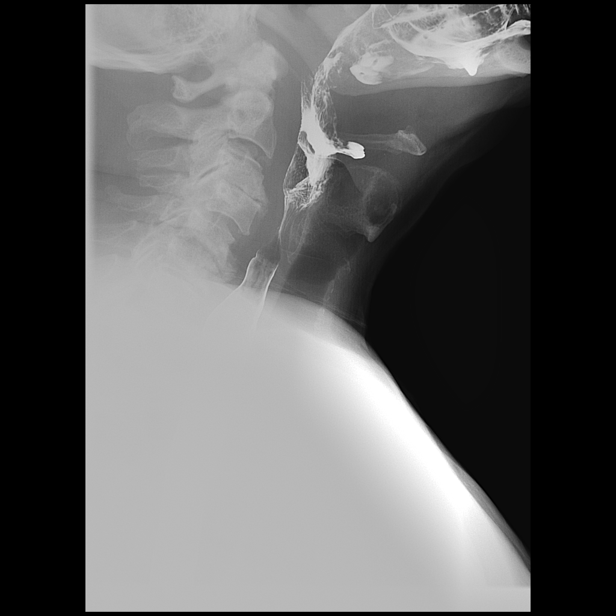

[Series 4: fluoro_barium 2fps_bw · 0.18mm/px · 1 of 1 slices shown (3 of 10)]
[im 1/1]
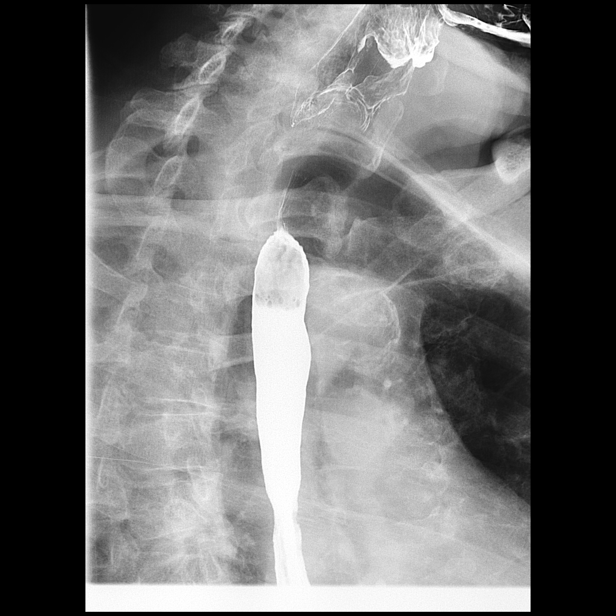

[Series 5: fluoro_barium 2fps_bw · 0.18mm/px · 1 of 1 slices shown (4 of 10)]
[im 1/1]
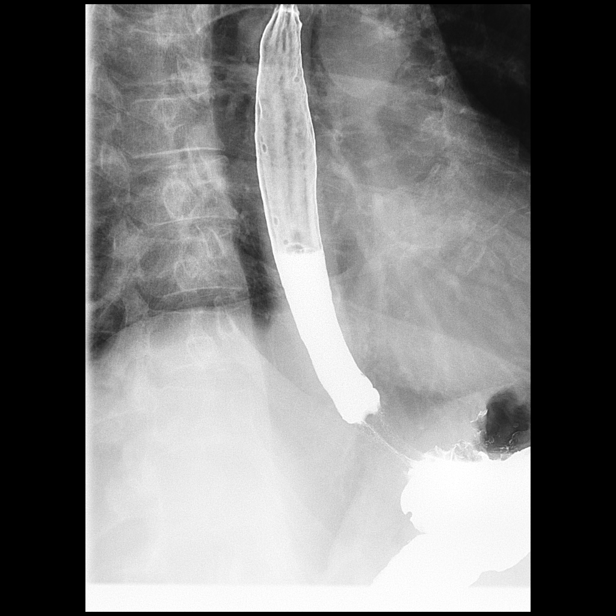

[Series 7: fluoro_barium 2fps_bw · 0.18mm/px · 1 of 1 slices shown (5 of 10)]
[im 1/1]
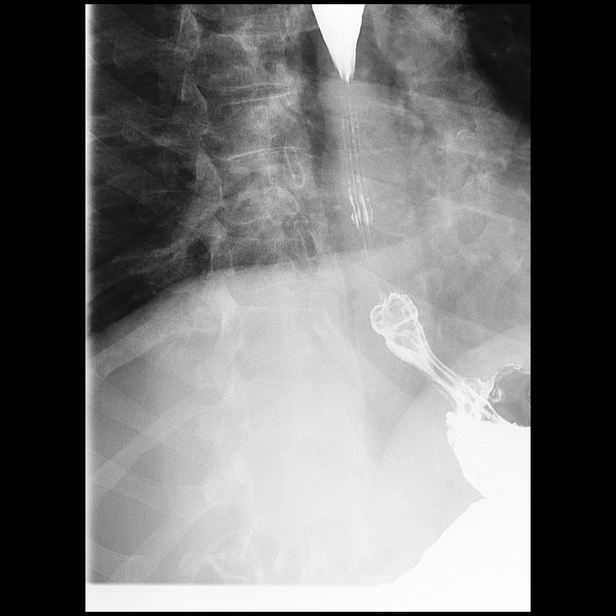

[Series 8: fluoro_barium 2fps_bw · 0.18mm/px · 1 of 1 slices shown (6 of 10)]
[im 1/1]
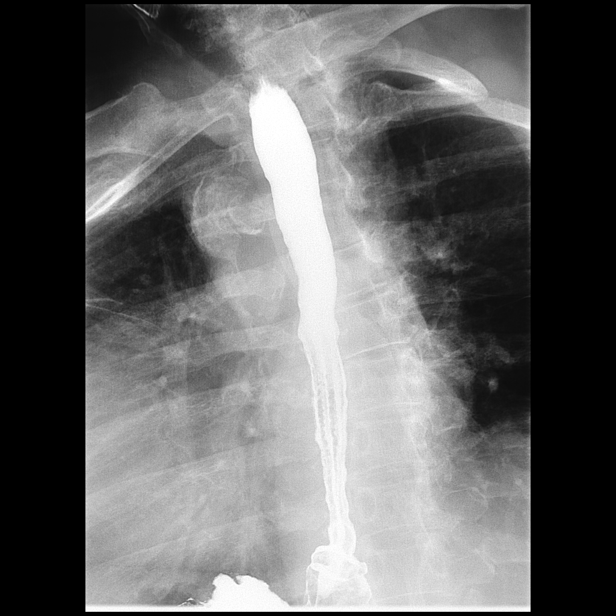

[Series 10: fluoro_barium 2fps_bw · 0.18mm/px · 1 of 1 slices shown (7 of 10)]
[im 1/1]
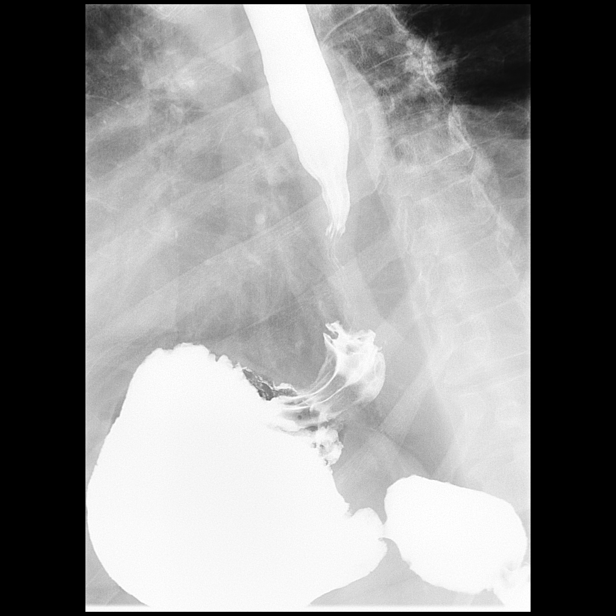

[Series 11: fluoro_barium 2fps_bw · 0.18mm/px · 1 of 1 slices shown (8 of 10)]
[im 1/1]
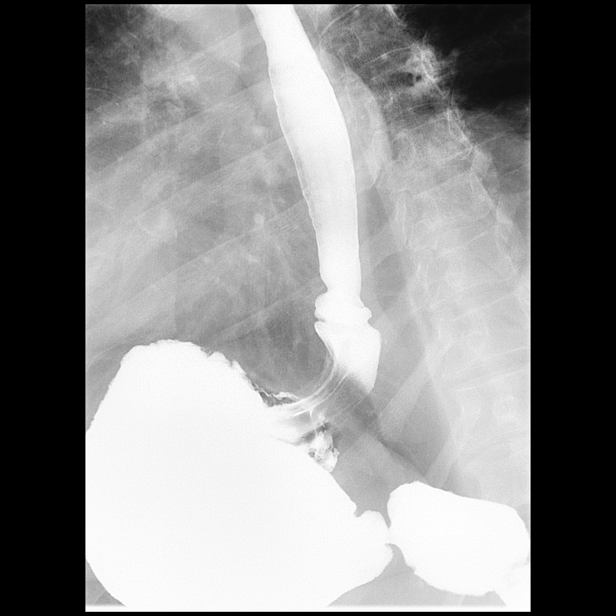

[Series 12: fluoro_barium 2fps_bw · 0.18mm/px · 1 of 1 slices shown (9 of 10)]
[im 1/1]
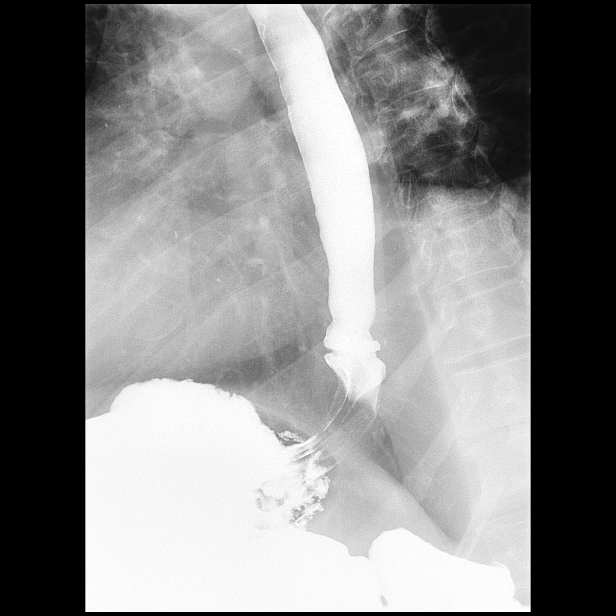

[Series 13: fluoro_barium 2fps_bw · 0.18mm/px · 1 of 2 frames shown (10 of 10)]
[frame 2/2]
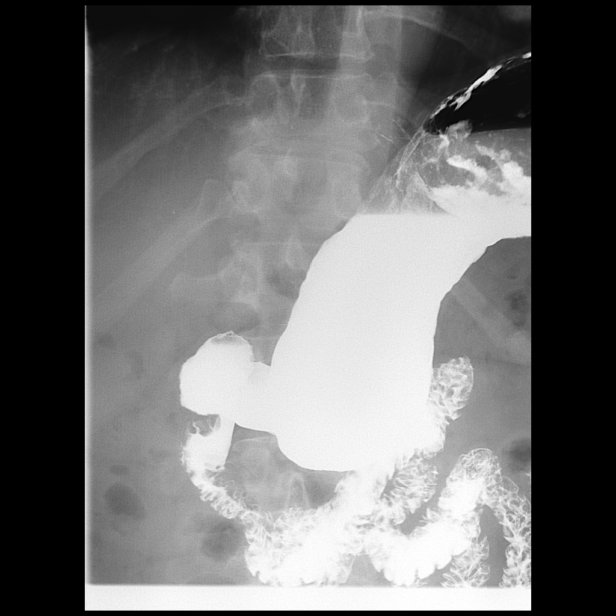

[14 of 20 positions shown; findings below may reference images not displayed]

FINDINGS: Small anterior cervical web noted. Prominence of the cricopharyngeus
muscle noted. Prominent degenerative changes noted cervical spine
with prominent anterolisthesis C4 on C5. These degenerative changes
result in minimal deformity of the cervical esophagus. Small sliding
hiatal hernia with a moderately prominent B ring. Irregular contour
noted of the distal esophagus just above the B ring. Small soft
tissue ulcer cannot be excluded. Endoscopic evaluation suggested. No
reflux. Standard sized barium tablet passed normally.
IMPRESSION: 1. Small anterior cervical web. Prominence of the cricopharyngeus
muscle noted. Prominent degenerative changes noted the cervical
spine with prominent anterolisthesis C4 on C5. These degenerative
changes result in minimal deformity cervical esophagus.

2. Small sliding hiatal hernia with moderately prominent B ring. No
reflux noted. Standardized barium tablet passed normally.

3. Irregularity of the contour of the distal esophagus just above
the B ring. Small ulceration may be present within the distal
esophagus. Endoscopic evaluation suggested.

## 2019-01-02 NOTE — Progress Notes (Signed)
Please follow-up with the patient to see what his blood pressure has been running at home.  If it has been greater than 130/80 consistently I would like to start him on a new medication that he would take every day.  I do not think he needs to be using the hydralazine on an as-needed basis.  [name], MD

## 2019-01-06 ENCOUNTER — Telehealth: Payer: Self-pay | Admitting: Family Medicine

## 2019-01-06 ENCOUNTER — Other Ambulatory Visit: Payer: Self-pay

## 2019-01-06 NOTE — Telephone Encounter (Signed)
Pt is calling to get lab results.

## 2019-01-07 MED ORDER — POTASSIUM CHLORIDE CRYS ER 10 MEQ PO TBCR: EXTENDED_RELEASE_TABLET | ORAL | 0 refills | Status: DC

## 2019-01-07 NOTE — Telephone Encounter (Signed)
I called and gave the patient lab results and he asked for me to refill his potassium and send it to [facility] and I sent it.  Alexandra Lipps,cma

## 2019-01-08 ENCOUNTER — Other Ambulatory Visit: Payer: Self-pay | Admitting: Pharmacy Technician

## 2019-01-08 NOTE — Patient Outreach (Signed)
[facility]) Care Management  01/08/2019  MELANIE HUME 1946-01-02 [id number]    Successful call placed to patient regarding patient assistance application(s) for Jardiance with BI and Trulicity with [employer] , HIPAA identifiers verified.   Patient informed he thinks he received the applications but is not completely sure. Patient was instructed multiple times that after he checks his mail and "junk area" and he does not have the application, then he needs to call me back so that I can mail him another one. Informed patient that I will assume he has the application unless he calls me back. Confirmed patient had name and number. Patient verbalized understanding that he will need to call me back if he can not locate the applications.  Follow up:  Will route note to embedded pharmacist Catie Darnelle Maffucci for case closure if document(s) have not been received in the next 15 business days.  Kenishia Plack P. Shalondra Wunschel, [place] Management [phone]

## 2019-01-08 NOTE — Progress Notes (Signed)
I spoke with patient & he stated that he has been checking his BP. He said his numbers have been ranging 140/90 and below. He said also that he just picked up hydralazine yesterday & had not taken any at this time. He said that you guys could discuss at his appointment in 1/12.

## 2019-01-09 ENCOUNTER — Other Ambulatory Visit: Payer: Self-pay

## 2019-01-10 ENCOUNTER — Telehealth: Payer: Self-pay | Admitting: *Deleted

## 2019-01-10 DIAGNOSIS — E1140 Type 2 diabetes mellitus with diabetic neuropathy, unspecified: Secondary | ICD-10-CM

## 2019-01-10 DIAGNOSIS — I10 Essential (primary) hypertension: Secondary | ICD-10-CM

## 2019-01-10 DIAGNOSIS — IMO0002 Reserved for concepts with insufficient information to code with codable children: Secondary | ICD-10-CM

## 2019-01-10 NOTE — Telephone Encounter (Signed)
Please place future orders for lab appt.  

## 2019-01-11 NOTE — Addendum Note (Signed)
Addended by: Leone Haven on: 01/11/2019 09:59 AM   Modules accepted: Orders

## 2019-01-11 NOTE — Progress Notes (Signed)
He has a lab appointment on 1/12.  He does not have an appointment with me.  I would like to see what his labs are and consider adding an additional medication for him to take daily.  His blood pressures are not at goal and we will need to consider something else for his blood pressure.

## 2019-01-11 NOTE — Telephone Encounter (Signed)
Ordered

## 2019-01-14 ENCOUNTER — Other Ambulatory Visit (INDEPENDENT_AMBULATORY_CARE_PROVIDER_SITE_OTHER): Payer: Medicare Other

## 2019-01-14 ENCOUNTER — Other Ambulatory Visit: Payer: Self-pay

## 2019-01-14 DIAGNOSIS — E1140 Type 2 diabetes mellitus with diabetic neuropathy, unspecified: Secondary | ICD-10-CM | POA: Diagnosis not present

## 2019-01-14 DIAGNOSIS — I10 Essential (primary) hypertension: Secondary | ICD-10-CM

## 2019-01-14 DIAGNOSIS — E1165 Type 2 diabetes mellitus with hyperglycemia: Secondary | ICD-10-CM

## 2019-01-14 DIAGNOSIS — IMO0002 Reserved for concepts with insufficient information to code with codable children: Secondary | ICD-10-CM

## 2019-01-14 LAB — BASIC METABOLIC PANEL WITH GFR
BUN: 22 mg/dL (ref 6–23)
CO2: 31 meq/L (ref 19–32)
Calcium: 10.1 mg/dL (ref 8.4–10.5)
Chloride: 101 meq/L (ref 96–112)
Creatinine, Ser: 1.85 mg/dL — ABNORMAL HIGH (ref 0.40–1.50)
GFR: 35.74 mL/min — ABNORMAL LOW
Glucose, Bld: 254 mg/dL — ABNORMAL HIGH (ref 70–99)
Potassium: 3.9 meq/L (ref 3.5–5.1)
Sodium: 141 meq/L (ref 135–145)

## 2019-01-14 LAB — HEMOGLOBIN A1C: Hgb A1c MFr Bld: 9.8 % — ABNORMAL HIGH (ref 4.6–6.5)

## 2019-01-15 ENCOUNTER — Telehealth: Payer: Self-pay | Admitting: Family Medicine

## 2019-01-15 NOTE — Telephone Encounter (Signed)
Pt called back returning your via lab results.

## 2019-01-15 NOTE — Telephone Encounter (Signed)
Patient returned call to get lab results.  

## 2019-01-15 NOTE — Telephone Encounter (Signed)
Called and left another detailed voicemail.  Kimyetta Flott,cma

## 2019-01-20 ENCOUNTER — Ambulatory Visit (INDEPENDENT_AMBULATORY_CARE_PROVIDER_SITE_OTHER): Payer: Medicare Other | Admitting: Pharmacist

## 2019-01-20 DIAGNOSIS — E1165 Type 2 diabetes mellitus with hyperglycemia: Secondary | ICD-10-CM | POA: Diagnosis not present

## 2019-01-20 DIAGNOSIS — E1140 Type 2 diabetes mellitus with diabetic neuropathy, unspecified: Secondary | ICD-10-CM

## 2019-01-20 DIAGNOSIS — IMO0002 Reserved for concepts with insufficient information to code with codable children: Secondary | ICD-10-CM

## 2019-01-20 NOTE — Patient Instructions (Addendum)
Mr. Harland,   It was great talking to you today! 1) Continue metformin 1000 mg twice daily, Jardiance 25 mg every morning, Trulicity 3 mg (2 of the 1.5 mg syringes weekly) and start taking glipizide 5 mg with the biggest meal of your day.   2) Return the patient assistance application information to [place].   3) Use the below chart to document your blood sugar readings for [name] to talk about on February 8th.   Call me with any questions or concerns!  Day Before Breakfast 2 Hrs After Supper Comments                                                                                                                          Visit Information  Goals Addressed            This Visit's Progress     Patient Stated   . "I want to keep my diabetes under control" (pt-stated)       Current Barriers:  . 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 Notes he spoke with Sharee Pimple last week about [name] still needing the patient portion of his application.  o Notes he has f/u with Dr. Juleen China on Wednesday.  . Current antihyperglycemic regimen: metformin 1000 mg BID, glipizide 5 mg QAM, Jardiance 25 mg QAM, Trulicity 3 mg once weekly (using 2 1.5 mg syringes)  . Current glucose readings:  o Fastings: 140-160s o After meals: not checking . Current meals:  o 1 afternoon meal: Has been eating a lot of soup lately, soup + crackers; grilled cheese sandwich;  o Unsweet tea, cut out sodas recently  . Cardiovascular risk reduction: o Current hypertensive regimen: losartan 100 mg, metoprolol XL 100 mg, amlodipine 10 mg daily; BP at home 130-140/80s on last check  o Current hyperlipidemia regimen: atorvastatin 40 mg, LDL <70 on last check o Current antiplatelet regimen: ASA 325 mg daily    Pharmacist Clinical Goal(s):  Marland Kitchen Over the next 90 days, patient with work with PharmD and primary care provider  to address optimized medication management  Interventions: . Discussed risks of long term glucose elevation, including micro and macrovascular complications.  . Discussed upcoming appt w/ Dr. Juleen China; as they will be checking renal function, patient does not need to schedule f/u lab with our office.  . If repeat eGFR remains reduced, may consider metformin dose reduction to 500 mg BID . Discussed A1c result. Patient was extremely disappointed. However, he notes that recently, he has been feeling much better than in the immediate period of time after the passing of his wife, and is committed to "getting it together" . Reviewed diabetes medications. He notes that he is taking glipizide QAM, but lately is only eating 1 afternoon meal. Recommended he move glipizide to take with his large daily meal.  . He will check fasting and 2 hour post prandial glucose readings over  the next 3 weeks. At that time, will likely start basal insulin therapy.  . Notes that his BP cuff has been malfunctioning lately. Recommended he purchase a new cuff to ensure accurate readings. He notes he will do this.   Patient Self Care Activities:  . Patient will check blood glucose BID, document, and provide at future appointments . Patient will focus on medication adherence by continuing to use his pill box  . Patient will take medications as prescribed . Patient will report any questions or concerns to provider   Please see past updates related to this goal by clicking on the "Past Updates" button in the selected goal         Print copy of patient instructions provided.   Day Before Breakfast 2 Hrs After Supper Comments

## 2019-01-20 NOTE — Progress Notes (Signed)
After 3 attempts to reach the patient I mailed a letter to his home.  Izzabelle Bouley,cma

## 2019-01-20 NOTE — Chronic Care Management (AMB) (Signed)
Chronic Care Management   Follow Up Note   01/20/2019 Name: Shane Jones MRN: [id number] DOB: Aug 20, 1945  Referred by: Leone Haven, MD Reason for referral : Chronic Care Management (Medication Management)   BRANNDON TUITE is a 74 y.o. year old male who is a primary care patient of Caryl Bis, Angela Adam, MD. The CCM team was consulted for assistance with chronic disease management and care coordination needs.    Patient was asleep during our scheduled appointment time, but returned my call and we were able to briefly talk about his labwork.   Review of patient status, including review of consultants reports, relevant laboratory and other test results, and collaboration with appropriate care team members and the patient's provider was performed as part of comprehensive patient evaluation and provision of chronic care management services.    SDOH (Social Determinants of Health) screening performed today: Financial Strain  Depression  . See Care Plan for related entries.   Outpatient Encounter Medications as of 01/20/2019  Medication Sig Note  . Dulaglutide (TRULICITY) 3 GS/8.1JS SOPN Inject 3 mg into the skin once a week.   . empagliflozin (JARDIANCE) 25 MG TABS tablet Take 25 mg by mouth daily.   Marland Kitchen escitalopram (LEXAPRO) 20 MG tablet TAKE 1 TABLET (20 MG) BY MOUTH DAILY   . furosemide (LASIX) 20 MG tablet TAKE 1 TABLET (20 MG TOTAL) DAILY   . glipiZIDE (GLUCOTROL) 5 MG tablet Take 1 tablet (5 mg total) by mouth 2 (two) times daily before a meal. 12/16/2018: Taking QAM   . metFORMIN (GLUCOPHAGE) 1000 MG tablet TAKE 1 TABLET BY MOUTH TWICE DAILY, WITH MEALS   . Accu-Chek FastClix Lancets MISC USE UP TO FOUR TIMES DAILY AS DIRECTED   . ACCU-CHEK GUIDE test strip USE UP TO FOUR TIMES DAILY AS DIRECTED   . allopurinol (ZYLOPRIM) 100 MG tablet TAKE 1 TABLET DAILY   . amLODipine (NORVASC) 10 MG tablet TAKE 1 TABLET EVERY DAY   . aspirin 325 MG tablet Take 325 mg by mouth daily.   Marland Kitchen  atorvastatin (LIPITOR) 40 MG tablet TAKE 1 TABLET EVERY DAY   . B Complex Vitamins (VITAMIN-B COMPLEX PO) Take by mouth.   Marland Kitchen BETA CAROTENE PO Take by mouth.   . blood glucose meter kit and supplies Dispense based on patient and insurance preference. Use up to four times daily as directed. (FOR ICD-10 E10.9, E11.9).   . Calcium Carbonate-Vitamin D (CALCIUM + D PO) Take by mouth.   . gabapentin (NEURONTIN) 400 MG capsule TAKE 1 CAPSULE(400 MG) BY MOUTH THREE TIMES DAILY 12/16/2018: Taking 800 mg QPM  . hydrALAZINE (APRESOLINE) 10 MG tablet Take 1 tablet (10 mg total) by mouth 2 (two) times daily as needed. If BP >130/>80 take 1 pill up to 2x per day   . losartan (COZAAR) 100 MG tablet TAKE 1 TABLET EVERY DAY   . magnesium 30 MG tablet Take 30 mg by mouth 2 (two) times daily.   . metoprolol succinate (TOPROL-XL) 100 MG 24 hr tablet TAKE 1 TABLET EVERY DAY   . Multiple Vitamin (MULTIVITAMIN) tablet Take 1 tablet by mouth daily.   . pantoprazole (PROTONIX) 40 MG tablet Take 1 tablet (40 mg total) by mouth daily. **PLEASE SCHEDULE FOLLOW UP APPT**   . potassium chloride (KLOR-CON) 10 MEQ tablet TAKE 1 TABLET BY MOUTH DAILY WITH LASIX   . potassium chloride (KLOR-CON) 10 MEQ tablet TAKE 1 TABLET BY MOUTH DAILY WITH LASIX   . Zinc Sulfate (ZINC 15  PO) Take by mouth.    No facility-administered encounter medications on file as of 01/20/2019.     Objective:   Goals Addressed            This Visit's Progress     Patient Stated   . "I want to keep my diabetes under control" (pt-stated)       Current Barriers:  . 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 Notes he spoke with Sharee Pimple last week about [name] still needing the patient portion of his application.  o Notes he has f/u with Dr. Juleen China on Wednesday.  . Current antihyperglycemic regimen: metformin 1000 mg BID, glipizide 5 mg QAM,  Jardiance 25 mg QAM, Trulicity 3 mg once weekly (using 2 1.5 mg syringes)  . Current glucose readings:  o Fastings: 140-160s o After meals: not checking . Current meals:  o 1 afternoon meal: Has been eating a lot of soup lately, soup + crackers; grilled cheese sandwich;  o Unsweet tea, cut out sodas recently  . Cardiovascular risk reduction: o Current hypertensive regimen: losartan 100 mg, metoprolol XL 100 mg, amlodipine 10 mg daily; BP at home 130-140/80s on last check  o Current hyperlipidemia regimen: atorvastatin 40 mg, LDL <70 on last check o Current antiplatelet regimen: ASA 325 mg daily    Pharmacist Clinical Goal(s):  Marland Kitchen Over the next 90 days, patient with work with PharmD and primary care provider to address optimized medication management  Interventions: . Discussed risks of long term glucose elevation, including micro and macrovascular complications.  . Discussed upcoming appt w/ Dr. Juleen China; as they will be checking renal function, patient does not need to schedule f/u lab with our office.  . If repeat eGFR remains reduced, may consider metformin dose reduction to 500 mg BID . Discussed A1c result. Patient was extremely disappointed. However, he notes that recently, he has been feeling much better than in the immediate period of time after the passing of his wife, and is committed to "getting it together" . Reviewed diabetes medications. He notes that he is taking glipizide QAM, but lately is only eating 1 afternoon meal. Recommended he move glipizide to take with his large daily meal.  . He will check fasting and 2 hour post prandial glucose readings over the next 3 weeks. At that time, will likely start basal insulin therapy.  . Notes that his BP cuff has been malfunctioning lately. Recommended he purchase a new cuff to ensure accurate readings. He notes he will do this.   Patient Self Care Activities:  . Patient will check blood glucose BID, document, and provide at future  appointments . Patient will focus on medication adherence by continuing to use his pill box  . Patient will take medications as prescribed . Patient will report any questions or concerns to provider   Please see past updates related to this goal by clicking on the "Past Updates" button in the selected goal         Plan: - Scheduled f/u call 02/10/19, morning before his PCP visit w/ Dr. Caryl Bis. Will plan to initiate insulin therapy at that visit, guided by BG readings.   Catie Darnelle Maffucci, PharmD, [place], CPP Clinical Pharmacist [place] [phone]

## 2019-01-22 DIAGNOSIS — E876 Hypokalemia: Secondary | ICD-10-CM | POA: Diagnosis not present

## 2019-01-22 DIAGNOSIS — I129 Hypertensive chronic kidney disease with stage 1 through stage 4 chronic kidney disease, or unspecified chronic kidney disease: Secondary | ICD-10-CM | POA: Diagnosis not present

## 2019-01-22 DIAGNOSIS — N2581 Secondary hyperparathyroidism of renal origin: Secondary | ICD-10-CM | POA: Diagnosis not present

## 2019-01-22 DIAGNOSIS — N179 Acute kidney failure, unspecified: Secondary | ICD-10-CM | POA: Insufficient documentation

## 2019-01-22 DIAGNOSIS — N1831 Chronic kidney disease, stage 3a: Secondary | ICD-10-CM | POA: Diagnosis not present

## 2019-01-22 DIAGNOSIS — E1122 Type 2 diabetes mellitus with diabetic chronic kidney disease: Secondary | ICD-10-CM | POA: Diagnosis not present

## 2019-01-22 DIAGNOSIS — R809 Proteinuria, unspecified: Secondary | ICD-10-CM | POA: Diagnosis not present

## 2019-01-29 ENCOUNTER — Other Ambulatory Visit: Payer: Self-pay | Admitting: Internal Medicine

## 2019-01-29 DIAGNOSIS — I10 Essential (primary) hypertension: Secondary | ICD-10-CM

## 2019-01-29 MED ORDER — HYDRALAZINE HCL 10 MG PO TABS: 10.0000 mg | ORAL_TABLET | Freq: Two times a day (BID) | ORAL | 2 refills | Status: DC | PRN

## 2019-02-10 ENCOUNTER — Ambulatory Visit (INDEPENDENT_AMBULATORY_CARE_PROVIDER_SITE_OTHER): Payer: Medicare Other | Admitting: Family Medicine

## 2019-02-10 ENCOUNTER — Encounter: Payer: Self-pay | Admitting: Family Medicine

## 2019-02-10 ENCOUNTER — Ambulatory Visit (INDEPENDENT_AMBULATORY_CARE_PROVIDER_SITE_OTHER): Payer: Medicare Other | Admitting: Pharmacist

## 2019-02-10 ENCOUNTER — Other Ambulatory Visit: Payer: Self-pay

## 2019-02-10 ENCOUNTER — Telehealth: Payer: Self-pay | Admitting: Family Medicine

## 2019-02-10 DIAGNOSIS — IMO0002 Reserved for concepts with insufficient information to code with codable children: Secondary | ICD-10-CM

## 2019-02-10 DIAGNOSIS — F32A Depression, unspecified: Secondary | ICD-10-CM

## 2019-02-10 DIAGNOSIS — E1165 Type 2 diabetes mellitus with hyperglycemia: Secondary | ICD-10-CM

## 2019-02-10 DIAGNOSIS — E1140 Type 2 diabetes mellitus with diabetic neuropathy, unspecified: Secondary | ICD-10-CM

## 2019-02-10 DIAGNOSIS — F329 Major depressive disorder, single episode, unspecified: Secondary | ICD-10-CM

## 2019-02-10 DIAGNOSIS — F419 Anxiety disorder, unspecified: Secondary | ICD-10-CM

## 2019-02-10 DIAGNOSIS — I10 Essential (primary) hypertension: Secondary | ICD-10-CM

## 2019-02-10 DIAGNOSIS — K209 Esophagitis, unspecified without bleeding: Secondary | ICD-10-CM

## 2019-02-10 MED ORDER — PANTOPRAZOLE SODIUM 40 MG PO TBEC: 40.0000 mg | DELAYED_RELEASE_TABLET | Freq: Every day | ORAL | 0 refills | Status: DC

## 2019-02-10 NOTE — Chronic Care Management (AMB) (Signed)
Chronic Care Management   Follow Up Note   02/10/2019 Name: Shane Jones MRN: [id number] DOB: [birth year]  Referred by: Leone Haven, MD Reason for referral : Chronic Care Management (Medication Management)   Shane Jones is a 74 y.o. year old male who is a primary care patient of Caryl Bis, Angela Adam, MD. The CCM team was consulted for assistance with chronic disease management and care coordination needs.    Contacted patient for medication management review.   Review of patient status, including review of consultants reports, relevant laboratory and other test results, and collaboration with appropriate care team members and the patient's provider was performed as part of comprehensive patient evaluation and provision of chronic care management services.    SDOH (Social Determinants of Health) screening performed today: Financial Strain . See Care Plan for related entries.   Outpatient Encounter Medications as of 02/10/2019  Medication Sig Note  . Accu-Chek FastClix Lancets MISC USE UP TO FOUR TIMES DAILY AS DIRECTED   . ACCU-CHEK GUIDE test strip USE UP TO FOUR TIMES DAILY AS DIRECTED   . allopurinol (ZYLOPRIM) 100 MG tablet TAKE 1 TABLET DAILY   . amLODipine (NORVASC) 10 MG tablet TAKE 1 TABLET EVERY DAY   . aspirin 325 MG tablet Take 325 mg by mouth daily.   Marland Kitchen atorvastatin (LIPITOR) 40 MG tablet TAKE 1 TABLET EVERY DAY   . B Complex Vitamins (VITAMIN-B COMPLEX PO) Take by mouth.   Marland Kitchen BETA CAROTENE PO Take by mouth.   . blood glucose meter kit and supplies Dispense based on patient and insurance preference. Use up to four times daily as directed. (FOR ICD-10 E10.9, E11.9).   . Dulaglutide (TRULICITY) 3 DX/4.1OI SOPN Inject 3 mg into the skin once a week.   . empagliflozin (JARDIANCE) 25 MG TABS tablet Take 25 mg by mouth daily.   Marland Kitchen escitalopram (LEXAPRO) 20 MG tablet TAKE 1 TABLET (20 MG) BY MOUTH DAILY   . furosemide (LASIX) 20 MG tablet TAKE 1 TABLET (20 MG TOTAL) DAILY   .  gabapentin (NEURONTIN) 400 MG capsule TAKE 1 CAPSULE(400 MG) BY MOUTH THREE TIMES DAILY 12/16/2018: Taking 800 mg QPM  . glipiZIDE (GLUCOTROL) 5 MG tablet Take 1 tablet (5 mg total) by mouth 2 (two) times daily before a meal. 02/10/2019: Taking with daily meal  . losartan (COZAAR) 100 MG tablet TAKE 1 TABLET EVERY DAY   . metFORMIN (GLUCOPHAGE) 1000 MG tablet TAKE 1 TABLET BY MOUTH TWICE DAILY, WITH MEALS   . metoprolol succinate (TOPROL-XL) 100 MG 24 hr tablet TAKE 1 TABLET EVERY DAY   . Multiple Vitamin (MULTIVITAMIN) tablet Take 1 tablet by mouth daily.   . pantoprazole (PROTONIX) 40 MG tablet Take 1 tablet (40 mg total) by mouth daily. **PLEASE SCHEDULE FOLLOW UP APPT**   . potassium chloride (KLOR-CON) 10 MEQ tablet TAKE 1 TABLET BY MOUTH DAILY WITH LASIX   . Calcium Carbonate-Vitamin D (CALCIUM + D PO) Take by mouth.   . hydrALAZINE (APRESOLINE) 10 MG tablet Take 1 tablet (10 mg total) by mouth 2 (two) times daily as needed. If BP >130/>80 take 1 pill up to 2x per day (Patient not taking: Reported on 02/10/2019)   . magnesium 30 MG tablet Take 30 mg by mouth 2 (two) times daily.   . Zinc Sulfate (ZINC 15 PO) Take by mouth.   . [DISCONTINUED] potassium chloride (KLOR-CON) 10 MEQ tablet TAKE 1 TABLET BY MOUTH DAILY WITH LASIX    No facility-administered encounter medications  on file as of 02/10/2019.     Objective:   Goals Addressed            This Visit's Progress     Patient Stated   . "I want to keep my diabetes under control" (pt-stated)       Current Barriers:  . 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 Needs [name] to re-send his portion of applications.  . Current antihyperglycemic regimen: metformin 1000 mg BID, glipizide 5 mg w/ big meal, Jardiance 25 mg QAM, Trulicity 3 mg once weekly (using 2 1.5 mg syringes)  . Current meal patterns:  o Eating  ~1 meal per day around 3-5 pm. Does note that he occasionally eats breakfast of cereal, and will have higher readings.  . Current glucose readings:  o Fastings: reports 119 the day we last spoke, though doesn't have his meter near by to see readings o After meals: reports 170-190s . Cardiovascular risk reduction: o Current hypertensive regimen: losartan 100 mg QAM, metoprolol XL 100 mg QAM, amlodipine 10 mg daily QAM, hydralazine 10 mg BID- through med review, realized he has not been taking this medication. Unsure how long.; reports BP at home ~130s recently, though 140s on last check w/ nephrology. Checked BP while we were on the phone - had NOT been sitting quietly, but reading was ~160/101 per his report. o Current hyperlipidemia regimen: atorvastatin 40 mg, LDL <70 on last check o Current antiplatelet regimen: ASA 325 mg daily; reports today no hx CVA   Pharmacist Clinical Goal(s):  Marland Kitchen Over the next 90 days, patient with work with PharmD and primary care provider to address optimized medication management  Interventions: . Comprehensive medication review with his medication bottles. Medication list updated in electronic medical record.  . Discussed importance of glucose control. Patient is to have his glucose readings ready for review for Dr. Ellen Henri appointment. Patient is also to take his morning medications and check BP (after having sat quietly for at least 5 minutes). Patient reported readings indicate control, though would like to fully review readings to indicate if basal insulin is needed. Consider asking patient to start taking glipizide with breakfast, if he does have a breakfast, and continue with supper meal.  . Recommended he move losartan to evening administration, with his second dose of metformin, to provide more balanced BP lowering throughout the day, and possibly provide improved CV risk reduction. He noted he will do so. He will restart hydralazine 10 mg BID.  Marland Kitchen Also  reported he would like refills on pantoprazole and future scripts for hydralazine to go to [facility], not [facility]. . Given no hx CVA, recommend reducing ASA from 325 mg to 81 mg daily. Encouraged patient to discuss w/ Dr. Caryl Bis today.   Patient Self Care Activities:  . Patient will check blood glucose BID, document, and provide at future appointments . Patient will focus on medication adherence by continuing to use his pill box  . Patient will take medications as prescribed . Patient will report any questions or concerns to provider   Please see past updates related to this goal by clicking on the "Past Updates" button in the selected goal          Plan:  - Will collaborate w/ CPhT to mail patient his portions of applications ago - Scheduled f/u call 03/10/19  Catie Darnelle Maffucci, PharmD, BCACP, [place]  [place] [phone]

## 2019-02-10 NOTE — Patient Instructions (Signed)
Visit Information  Goals Addressed            This Visit's Progress     Patient Stated   . "I want to keep my diabetes under control" (pt-stated)       Current Barriers:  . 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 Needs [name] to re-send his portion of applications.  . Current antihyperglycemic regimen: metformin 1000 mg BID, glipizide 5 mg w/ big meal, Jardiance 25 mg QAM, Trulicity 3 mg once weekly (using 2 1.5 mg syringes)  . Current meal patterns:  o Eating ~1 meal per day around 3-5 pm. Does note that he occasionally eats breakfast of cereal, and will have higher readings.  . Current glucose readings:  o Fastings: reports 119 the day we last spoke, though doesn't have his meter near by to see readings o After meals: reports 170-190s . Cardiovascular risk reduction: o Current hypertensive regimen: losartan 100 mg QAM, metoprolol XL 100 mg QAM, amlodipine 10 mg daily QAM, hydralazine 10 mg BID- through med review, realized he has not been taking this medication. Unsure how long.; reports BP at home ~130s recently, though 140s on last check w/ nephrology. Checked BP while we were on the phone - had NOT been sitting quietly, but reading was ~160/101 per his report. o Current hyperlipidemia regimen: atorvastatin 40 mg, LDL <70 on last check o Current antiplatelet regimen: ASA 325 mg daily; reports today no hx CVA   Pharmacist Clinical Goal(s):  Marland Kitchen Over the next 90 days, patient with work with PharmD and primary care provider to address optimized medication management  Interventions: . Comprehensive medication review with his medication bottles. Medication list updated in electronic medical record.  . Discussed importance of glucose control. Patient is to have his glucose readings ready for review for Dr. Ellen Henri appointment. Patient is also to take his  morning medications and check BP (after having sat quietly for at least 5 minutes). Patient reported readings indicate control, though would like to fully review readings to indicate if basal insulin is needed. Consider asking patient to start taking glipizide with breakfast, if he does have a breakfast, and continue with supper meal.  . Recommended he move losartan to evening administration, with his second dose of metformin, to provide more balanced BP lowering throughout the day, and possibly provide improved CV risk reduction. He noted he will do so. He will restart hydralazine 10 mg BID.  Marland Kitchen Also reported he would like refills on pantoprazole and future scripts for hydralazine to go to [facility] not [facility]. . Given no hx CVA, recommend reducing ASA from 325 mg to 81 mg daily. Encouraged patient to discuss w/ Dr. Caryl Bis today.   Patient Self Care Activities:  . Patient will check blood glucose BID, document, and provide at future appointments . Patient will focus on medication adherence by continuing to use his pill box  . Patient will take medications as prescribed . Patient will report any questions or concerns to provider   Please see past updates related to this goal by clicking on the "Past Updates" button in the selected goal         The patient verbalized understanding of instructions provided today and declined a print copy of patient instruction materials.   Plan:  - Will collaborate w/ CPhT to mail patient his portions of applications ago - Scheduled f/u call 03/10/19  Catie  Darnelle Maffucci, PharmD, [place], [place] Clinical Pharmacist [place] [phone]

## 2019-02-10 NOTE — Chronic Care Management (AMB) (Addendum)
  Chronic Care Management   Note  02/10/2019 Name: Shane Jones MRN: [id number] DOB: [birth year]  Shane Jones is a 74 y.o. year old male who is a primary care patient of Caryl Bis, Angela Adam, MD. The CCM team was consulted for assistance with chronic disease management and care coordination needs.    Patient calling to report BP today was 133/81 HR 79. Also calling to report he found the packet of paperwork sent to him by Susy Frizzle, CPhT, and found his updated income information. He will plan to drop signed paperwork, copy of income info, and copy of [facility] 2021 card off by the clinic for me.   Catie Darnelle Maffucci, PharmD, [place], CPP Clinical Pharmacist [place] [phone]

## 2019-02-10 NOTE — Assessment & Plan Note (Signed)
Slightly above goal.  He is going to switch his losartan to nighttime dosing to see if that is beneficial..  He will keep an eye on his blood pressures and we can see how his BP is doing when he follows up with our clinical pharmacist.  We will continue his current regimen.

## 2019-02-10 NOTE — Assessment & Plan Note (Signed)
Asymptomatic on medication.  Discussed following up with GI.  Referral placed.

## 2019-02-10 NOTE — Telephone Encounter (Signed)
Pt called wanting to speak to you further about the appt he had today

## 2019-02-10 NOTE — Assessment & Plan Note (Signed)
Asymptomatic.  Continue current regimen. 

## 2019-02-10 NOTE — Assessment & Plan Note (Signed)
Fasting glucose seems to be well controlled based on his reports.  We will confer with our clinical pharmacist to make a decision on basal insulin.  He will continue his current regimen.

## 2019-02-10 NOTE — Progress Notes (Signed)
Virtual Visit via video Note  This visit type was conducted due to national recommendations for restrictions regarding the COVID-19 pandemic (e.g. social distancing).  This format is felt to be most appropriate for this patient at this time.  All issues noted in this document were discussed and addressed.  No physical exam was performed (except for noted visual exam findings with Video Visits).   I connected with Shane Jones today at  1:45 PM EST by a video enabled telemedicine application and verified that I am speaking with the correct person using two identifiers. Location patient: home Location provider: work Persons participating in the virtual visit: patient, provider  I discussed the limitations, risks, security and privacy concerns of performing an evaluation and management service by telephone and the availability of in person appointments. I also discussed with the patient that there may be a patient responsible charge related to this service. The patient expressed understanding and agreed to proceed.   Reason for visit: follow-up  HPI: HYPERTENSION  Disease Monitoring  Home BP Monitoring 133/81 today Chest pain- no    Dyspnea- no Medications  Compliance-  Taking amlodipine, metoprolol, losartan. Clinical pharmacist advised him to change to night time.  Edema- no  DIABETES Disease Monitoring: Blood Sugar ranges-115-125 fasting, 170-190 2 hour post prandial Polyuria/phagia/dipsia- no      Optho- due Medications: Compliance- taking trulicity 3 mg weekly, glipizide, jardiance, metformin Hypoglycemic symptoms- no  Depression/anxiety: No anxiety or depression.  He is on Lexapro.  Esophagitis: Patient notes no reflux symptoms he is taking Protonix.  He notes no dysphagia.  No blood in his stool.  He had a prior endoscopy that did reveal an esophageal ulcer and a benign stricture.  He has not seen GI since then.   ROS: See pertinent positives and negatives per HPI.  Past  Medical History:  Diagnosis Date  . Diabetes mellitus without complication ([place])   . Gout   . Hypertension   . Sleep apnea    has CPAP, doesn't use  . Wears dentures    full upper and lower    Past Surgical History:  Procedure Laterality Date  . BALLOON DILATION N/A 10/22/2017   Procedure: BALLOON DILATION;  Surgeon: Lucilla Lame, MD;  Location: [place];  Service: Endoscopy;  Laterality: N/A;  . ESOPHAGOGASTRODUODENOSCOPY (EGD) WITH PROPOFOL N/A 10/22/2017   Procedure: ESOPHAGOGASTRODUODENOSCOPY (EGD) WITH Biopsy;  Surgeon: Lucilla Lame, MD;  Location: [place];  Service: Endoscopy;  Laterality: N/A;  . TONSILLECTOMY      No family history on file.  SOCIAL HX: Former smoker   Current Outpatient Medications:  .  Accu-Chek FastClix Lancets MISC, USE UP TO FOUR TIMES DAILY AS DIRECTED, Disp: 102 each, Rfl: 2 .  ACCU-CHEK GUIDE test strip, USE UP TO FOUR TIMES DAILY AS DIRECTED, Disp: 100 each, Rfl: 2 .  allopurinol (ZYLOPRIM) 100 MG tablet, TAKE 1 TABLET DAILY, Disp: 90 tablet, Rfl: 03 .  amLODipine (NORVASC) 10 MG tablet, TAKE 1 TABLET EVERY DAY, Disp: 90 tablet, Rfl: 2 .  aspirin 325 MG tablet, Take 325 mg by mouth daily., Disp: , Rfl:  .  atorvastatin (LIPITOR) 40 MG tablet, TAKE 1 TABLET EVERY DAY, Disp: 90 tablet, Rfl: 3 .  B Complex Vitamins (VITAMIN-B COMPLEX PO), Take by mouth., Disp: , Rfl:  .  BETA CAROTENE PO, Take by mouth., Disp: , Rfl:  .  blood glucose meter kit and supplies, Dispense based on patient and insurance preference. Use up to four  times daily as directed. (FOR ICD-10 E10.9, E11.9)., Disp: 1 each, Rfl: 0 .  Calcium Carbonate-Vitamin D (CALCIUM + D PO), Take by mouth., Disp: , Rfl:  .  Dulaglutide (TRULICITY) 3 UX/3.2GM SOPN, Inject 3 mg into the skin once a week., Disp: 12 pen, Rfl: 2 .  empagliflozin (JARDIANCE) 25 MG TABS tablet, Take 25 mg by mouth daily., Disp: 90 tablet, Rfl: 3 .  escitalopram (LEXAPRO) 20 MG tablet, TAKE 1  TABLET (20 MG) BY MOUTH DAILY, Disp: 90 tablet, Rfl: 1 .  furosemide (LASIX) 20 MG tablet, TAKE 1 TABLET (20 MG TOTAL) DAILY, Disp: 90 tablet, Rfl: 0 .  gabapentin (NEURONTIN) 400 MG capsule, TAKE 1 CAPSULE(400 MG) BY MOUTH THREE TIMES DAILY, Disp: 270 capsule, Rfl: 0 .  glipiZIDE (GLUCOTROL) 5 MG tablet, Take 1 tablet (5 mg total) by mouth 2 (two) times daily before a meal., Disp: 180 tablet, Rfl: 3 .  hydrALAZINE (APRESOLINE) 10 MG tablet, Take 1 tablet (10 mg total) by mouth 2 (two) times daily as needed. If BP >130/>80 take 1 pill up to 2x per day, Disp: 60 tablet, Rfl: 2 .  losartan (COZAAR) 100 MG tablet, TAKE 1 TABLET EVERY DAY, Disp: 90 tablet, Rfl: 2 .  magnesium 30 MG tablet, Take 30 mg by mouth 2 (two) times daily., Disp: , Rfl:  .  metFORMIN (GLUCOPHAGE) 1000 MG tablet, TAKE 1 TABLET BY MOUTH TWICE DAILY, WITH MEALS, Disp: 180 tablet, Rfl: 3 .  metoprolol succinate (TOPROL-XL) 100 MG 24 hr tablet, TAKE 1 TABLET EVERY DAY, Disp: 90 tablet, Rfl: 1 .  Multiple Vitamin (MULTIVITAMIN) tablet, Take 1 tablet by mouth daily., Disp: , Rfl:  .  pantoprazole (PROTONIX) 40 MG tablet, Take 1 tablet (40 mg total) by mouth daily., Disp: 90 tablet, Rfl: 0 .  potassium chloride (KLOR-CON) 10 MEQ tablet, TAKE 1 TABLET BY MOUTH DAILY WITH LASIX, Disp: 90 tablet, Rfl: 0 .  Zinc Sulfate (ZINC 15 PO), Take by mouth., Disp: , Rfl:   EXAM:  VITALS per patient if applicable:  GENERAL: alert, oriented, appears well and in no acute distress  HEENT: atraumatic, conjunttiva clear, no obvious abnormalities on inspection of external nose and ears  NECK: normal movements of the head and neck  LUNGS: on inspection no signs of respiratory distress, breathing rate appears normal, no obvious gross SOB, gasping or wheezing  CV: no obvious cyanosis  MS: moves all visible extremities without noticeable abnormality  PSYCH/NEURO: pleasant and cooperative, no obvious depression or anxiety, speech and thought  processing grossly intact  ASSESSMENT AND PLAN:  Discussed the following assessment and plan:  Hypertension Slightly above goal.  He is going to switch his losartan to nighttime dosing to see if that is beneficial..  He will keep an eye on his blood pressures and we can see how his BP is doing when he follows up with our clinical pharmacist.  We will continue his current regimen.  Esophagitis Asymptomatic on medication.  Discussed following up with GI.  Referral placed.  Type 2 diabetes, uncontrolled, with neuropathy (HCC) Fasting glucose seems to be well controlled based on his reports.  We will confer with our clinical pharmacist to make a decision on basal insulin.  He will continue his current regimen.  Anxiety and depression Asymptomatic.  Continue current regimen.   Orders Placed This Encounter  Procedures  . Ambulatory referral to Gastroenterology    Referral Priority:   Routine    Referral Type:   Consultation  Referral Reason:   Specialty Services Required    Number of Visits Requested:   1    Meds ordered this encounter  Medications  . pantoprazole (PROTONIX) 40 MG tablet    Sig: Take 1 tablet (40 mg total) by mouth daily.    Dispense:  90 tablet    Refill:  0     I discussed the assessment and treatment plan with the patient. The patient was provided an opportunity to ask questions and all were answered. The patient agreed with the plan and demonstrated an understanding of the instructions.   The patient was advised to call back or seek an in-person evaluation if the symptoms worsen or if the condition fails to improve as anticipated.    [name], MD

## 2019-02-10 NOTE — Telephone Encounter (Signed)
Called patient back. See CCM documentation 

## 2019-02-27 ENCOUNTER — Other Ambulatory Visit: Payer: Self-pay | Admitting: Family Medicine

## 2019-03-10 ENCOUNTER — Ambulatory Visit: Payer: Self-pay | Admitting: Pharmacist

## 2019-03-10 ENCOUNTER — Telehealth: Payer: Medicare Other

## 2019-03-10 NOTE — Chronic Care Management (AMB) (Signed)
  Chronic Care Management   Note  03/10/2019 Name: [name] MRN: [id number] DOB: May 13, 1945  Shane Jones is a 74 y.o. year old male who is a primary care patient of Caryl Bis, Angela Adam, MD. The CCM team was consulted for assistance with chronic disease management and care coordination needs.    Attempted to contact patient for medication management review. Left HIPAA compliant message for him to return my call at his convenience.   Follow up plan: - Will collaborate w/ Care Guide to outreach for scheduling  Catie Darnelle Maffucci, PharmD, [place], [place] Pharmacist [place] [phone]

## 2019-03-11 ENCOUNTER — Telehealth: Payer: Self-pay | Admitting: Family Medicine

## 2019-03-11 NOTE — Chronic Care Management (AMB) (Signed)
  Care Management   Note  03/11/2019 Name: Shane Jones MRN: [id number] DOB: 04/10/1945  Shane Jones is a 74 y.o. year old male who is a primary care patient of Leone Haven, MD and is actively engaged with the care management team. I reached out to Irean Hong by phone today to assist with re-scheduling a follow up visit with the Pharmacist  Follow up plan: Telephone appointment with care management team member scheduled for:04/17/2019  Noreene Larsson, [place], [place], [place] [address] Direct Dial: [phone] [email or web address] Website: [email or web address]

## 2019-03-11 NOTE — Chronic Care Management (AMB) (Signed)
  Care Management   Note  03/11/2019 Name: STANLEE DANH MRN: [id number] DOB: 1945-04-21  MUZAMMIL HETTICH is a 74 y.o. year old male who is a primary care patient of Leone Haven, MD and is actively engaged with the care management team. I reached out to Irean Hong by phone today to assist with re-scheduling a follow up visit with the Pharmacist  Follow up plan: Unsuccessful telephone outreach attempt made. A HIPPA compliant phone message was left for the patient providing contact information and requesting a return call.  The care management team will reach out to the patient again over the next 7 days.  If patient returns call to provider office, please advise to call [place]  at [facility], [place], [place], [place] [address] Direct Dial: [phone] [email or web address] Website: [email or web address]

## 2019-03-19 ENCOUNTER — Encounter: Payer: Self-pay | Admitting: *Deleted

## 2019-03-19 ENCOUNTER — Ambulatory Visit: Payer: Medicare Other | Admitting: Gastroenterology

## 2019-04-04 ENCOUNTER — Other Ambulatory Visit: Payer: Self-pay

## 2019-04-10 ENCOUNTER — Other Ambulatory Visit: Payer: Self-pay

## 2019-04-10 DIAGNOSIS — E1140 Type 2 diabetes mellitus with diabetic neuropathy, unspecified: Secondary | ICD-10-CM

## 2019-04-10 DIAGNOSIS — K209 Esophagitis, unspecified without bleeding: Secondary | ICD-10-CM

## 2019-04-10 DIAGNOSIS — E876 Hypokalemia: Secondary | ICD-10-CM

## 2019-04-10 DIAGNOSIS — IMO0002 Reserved for concepts with insufficient information to code with codable children: Secondary | ICD-10-CM

## 2019-04-10 MED ORDER — PANTOPRAZOLE SODIUM 40 MG PO TBEC: 40.0000 mg | DELAYED_RELEASE_TABLET | Freq: Every day | ORAL | 0 refills | Status: DC

## 2019-04-10 MED ORDER — POTASSIUM CHLORIDE CRYS ER 10 MEQ PO TBCR: EXTENDED_RELEASE_TABLET | ORAL | 0 refills | Status: DC

## 2019-04-10 MED ORDER — FUROSEMIDE 20 MG PO TABS: ORAL_TABLET | ORAL | 1 refills | Status: DC

## 2019-04-10 MED ORDER — GLIPIZIDE 5 MG PO TABS: 5.0000 mg | ORAL_TABLET | Freq: Two times a day (BID) | ORAL | 3 refills | Status: DC

## 2019-04-10 MED ORDER — METFORMIN HCL 1000 MG PO TABS: 1000.0000 mg | ORAL_TABLET | Freq: Two times a day (BID) | ORAL | 3 refills | Status: DC

## 2019-04-13 ENCOUNTER — Other Ambulatory Visit: Payer: Self-pay | Admitting: Family Medicine

## 2019-04-13 DIAGNOSIS — E1140 Type 2 diabetes mellitus with diabetic neuropathy, unspecified: Secondary | ICD-10-CM

## 2019-04-13 DIAGNOSIS — IMO0002 Reserved for concepts with insufficient information to code with codable children: Secondary | ICD-10-CM

## 2019-04-17 ENCOUNTER — Ambulatory Visit (INDEPENDENT_AMBULATORY_CARE_PROVIDER_SITE_OTHER): Payer: Medicare Other | Admitting: Pharmacist

## 2019-04-17 DIAGNOSIS — E1140 Type 2 diabetes mellitus with diabetic neuropathy, unspecified: Secondary | ICD-10-CM | POA: Diagnosis not present

## 2019-04-17 DIAGNOSIS — IMO0002 Reserved for concepts with insufficient information to code with codable children: Secondary | ICD-10-CM

## 2019-04-17 DIAGNOSIS — E1165 Type 2 diabetes mellitus with hyperglycemia: Secondary | ICD-10-CM | POA: Diagnosis not present

## 2019-04-17 DIAGNOSIS — I10 Essential (primary) hypertension: Secondary | ICD-10-CM

## 2019-04-17 NOTE — Chronic Care Management (AMB) (Signed)
Chronic Care Management   Follow Up Note   04/17/2019 Name: Shane Jones MRN: [id number] DOB: 06-Dec-1945  Referred by: Leone Haven, MD Reason for referral : Chronic Care Management (Medication Management)   Shane Jones is a 74 y.o. year old male who is a primary care patient of Caryl Bis, Angela Adam, MD. The CCM team was consulted for assistance with chronic disease management and care coordination needs.    Contacted patient for medication management review.   Review of patient status, including review of consultants reports, relevant laboratory and other test results, and collaboration with appropriate care team members and the patient's provider was performed as part of comprehensive patient evaluation and provision of chronic care management services.    SDOH (Social Determinants of Health) assessments performed: No See Care Plan activities for detailed interventions related to [facility])     Outpatient Encounter Medications as of 04/17/2019  Medication Sig  . glipiZIDE (GLUCOTROL) 5 MG tablet Take 1 tablet (5 mg total) by mouth 2 (two) times daily before a meal.  . Accu-Chek FastClix Lancets MISC USE UP TO FOUR TIMES DAILY AS DIRECTED  . ACCU-CHEK GUIDE test strip USE UP TO FOUR TIMES DAILY AS DIRECTED  . allopurinol (ZYLOPRIM) 100 MG tablet TAKE 1 TABLET DAILY  . amLODipine (NORVASC) 10 MG tablet TAKE 1 TABLET EVERY DAY  . aspirin 325 MG tablet Take 325 mg by mouth daily.  Marland Kitchen atorvastatin (LIPITOR) 40 MG tablet TAKE 1 TABLET EVERY DAY  . B Complex Vitamins (VITAMIN-B COMPLEX PO) Take by mouth.  Marland Kitchen BETA CAROTENE PO Take by mouth.  . blood glucose meter kit and supplies Dispense based on patient and insurance preference. Use up to four times daily as directed. (FOR ICD-10 E10.9, E11.9).  . Calcium Carbonate-Vitamin D (CALCIUM + D PO) Take by mouth.  . Dulaglutide (TRULICITY) 3 GY/1.8HU SOPN Inject 3 mg into the skin once a week.  . empagliflozin (JARDIANCE) 25 MG TABS tablet Take  25 mg by mouth daily.  Marland Kitchen escitalopram (LEXAPRO) 20 MG tablet TAKE 1 TABLET (20 MG) BY MOUTH DAILY  . furosemide (LASIX) 20 MG tablet Take one tablet (20 mg total) by mouth daily.  Marland Kitchen gabapentin (NEURONTIN) 400 MG capsule TAKE 1 CAPSULE THREE TIMES DAILY  . hydrALAZINE (APRESOLINE) 10 MG tablet Take 1 tablet (10 mg total) by mouth 2 (two) times daily as needed. If BP >130/>80 take 1 pill up to 2x per day  . losartan (COZAAR) 100 MG tablet TAKE 1 TABLET EVERY DAY  . magnesium 30 MG tablet Take 30 mg by mouth 2 (two) times daily.  . metFORMIN (GLUCOPHAGE) 1000 MG tablet Take 1 tablet (1,000 mg total) by mouth 2 (two) times daily with a meal.  . metoprolol succinate (TOPROL-XL) 100 MG 24 hr tablet TAKE 1 TABLET EVERY DAY  . Multiple Vitamin (MULTIVITAMIN) tablet Take 1 tablet by mouth daily.  . pantoprazole (PROTONIX) 40 MG tablet Take 1 tablet (40 mg total) by mouth daily.  . potassium chloride (KLOR-CON) 10 MEQ tablet TAKE 1 TABLET BY MOUTH DAILY WITH LASIX  . Zinc Sulfate (ZINC 15 PO) Take by mouth.   No facility-administered encounter medications on file as of 04/17/2019.     Objective:   Goals Addressed            This Visit's Progress     Patient Stated   . "I want to keep my diabetes under control" (pt-stated)       [place] (  see longtitudinal plan of care for additional care plan information)  Current Barriers:  . Diabetes: uncontrolled; most recent B6L 8.4%, complicated by HTN, CKD . Current antihyperglycemic regimen: metformin 1000 mg BID, glipizide 5 mg w/ big meal, Jardiance 25 mg QAM, Trulicity 3 mg once weekly - unable to verbalize his medication dosing and adherence today, he is out at [employer] and not with a medication list or pill bottles o Were working on patient assistance, however, patient lost the assistance application forms for [employer] and [employer].  . Current meal patterns: o Reports that he's been eating more salads, focusing on more vegetables. Notes that he  wants to try to cut back on carbohydrates, and eventually adjust his diet enough to reduce medications . Current glucose readings:  o Reports readings in the 200-300s for the few weeks that his daughter and son in law had COVID, he notes this was caused by stress.  o Fasting: 165 yesterday. Unable to verbalize any other readings. . Cardiovascular risk reduction: o Current hypertensive regimen: losartan 100 mg QAM, metoprolol XL 100 mg QAM, amlodipine 10 mg daily QAM, hydralazine 10 mg BID o Current hyperlipidemia regimen: atorvastatin 40 mg, LDL <70 on last check o Current antiplatelet regimen: ASA 325 mg daily; reports no hx CVA  o Of note, metoprolol, amlodipine, atorvastatin, losartan have not been filled on his insurance in 2021, per Outside Refill Hx  Pharmacist Clinical Goal(s):  Marland Kitchen Over the next 90 days, patient with work with PharmD and primary care provider to address optimized medication management  Interventions: . Comprehensive medication review performed, medication list updated in electronic medical record . Inter-disciplinary care team collaboration (see longitudinal plan of care) . Unable to review medications, doses, blood sugars today. Multiple discrepancies between recent fill history and current medication list. Mentioned this to patient, he notes that he has "multiple bottles at home".  . Reiterated the importance of better focus on DM, HTN to reduce risk of long term macro and microvascular complications. Scheduled face to face office visit for full medication review. Encouraged patient to bring all medications to this appointment . Will collaborate w/ CPhT to re-mail patient portions of application for Jardiance through [place] and Trulicity through [employer].  . Due for A1c. No f/u scheduled w/ PCP. Will check w/ Dr. Caryl Bis to see when patient should be next scheduled with him for follow up and lab work  Patient Self Care Activities:  . Patient will check blood glucose BID,  document, and provide at future appointments . Patient will focus on medication adherence by continuing to use his pill box  . Patient will take medications as prescribed . Patient will report any questions or concerns to provider   Please see past updates related to this goal by clicking on the "Past Updates" button in the selected goal          Plan:  - Scheduled f/u face to face appointment 05/22/19  Catie Darnelle Maffucci, PharmD, [place], [place] Pharmacist [place] [phone]

## 2019-04-17 NOTE — Patient Instructions (Addendum)
[name],   It was great talking to you today!   Please BRING ALL OF YOUR MEDICATIONS to our appointment in May. Since things haven't been filled regularly at the pharmacy, I want to review to make sure you are taking everything the way you should be.   Please start checking and recording your sugars twice daily, every day. 1) fasting, before you eat in the mornings and 2) about 2 hours after the biggest meal of your day. To reach a goal A1c of less than 7, we want to see fasting readings less than 130 and 2 hour after meal readings less than 180.   Shane Jones is going to re-mail you the pages of the Trulicity and Jardiance applications that I need you to complete and return. We will also need proof of your 2021 income. Visit Information  Goals Addressed            This Visit's Progress     Patient Stated   . "I want to keep my diabetes under control" (pt-stated)       CARE PLAN ENTRY (see longtitudinal plan of care for additional care plan information)  Current Barriers:  . Diabetes: uncontrolled; most recent [id number], complicated by HTN, CKD . Current antihyperglycemic regimen: metformin 1000 mg BID, glipizide 5 mg w/ big meal, Jardiance 25 mg QAM, Trulicity 3 mg once weekly - unable to verbalize his medication dosing and adherence today, he is out at [employer] and not with a medication list or pill bottles o Were working on patient assistance, however, patient lost the assistance application forms for [employer].  . Current meal patterns: o Reports that he's been eating more salads, focusing on more vegetables. Notes that he wants to try to cut back on carbohydrates, and eventually adjust his diet enough to reduce medications . Current glucose readings:  o Reports readings in the 200-300s for the few weeks that his daughter and son in law had COVID, he notes this was caused by stress.  o Fasting: 165 yesterday. Unable to verbalize any other readings. . Cardiovascular risk  reduction: o Current hypertensive regimen: losartan 100 mg QAM, metoprolol XL 100 mg QAM, amlodipine 10 mg daily QAM, hydralazine 10 mg BID o Current hyperlipidemia regimen: atorvastatin 40 mg, LDL <70 on last check o Current antiplatelet regimen: ASA 325 mg daily; reports no hx CVA  o Of note, metoprolol, amlodipine, atorvastatin, losartan have not been filled on his insurance in 2021, per Outside Refill Hx  Pharmacist Clinical Goal(s):  Marland Kitchen Over the next 90 days, patient with work with PharmD and primary care provider to address optimized medication management  Interventions: . Comprehensive medication review performed, medication list updated in electronic medical record . Inter-disciplinary care team collaboration (see longitudinal plan of care) . Unable to review medications, doses, blood sugars today. Multiple discrepancies between recent fill history and current medication list. Mentioned this to patient, he notes that he has "multiple bottles at home".  . Reiterated the importance of better focus on DM, HTN to reduce risk of long term macro and microvascular complications. Scheduled face to face office visit for full medication review. Encouraged patient to bring all medications to this appointment . Will collaborate w/ CPhT to re-mail patient portions of application for Jardiance through [place] and Trulicity through [employer].   Patient Self Care Activities:  . Patient will check blood glucose BID, document, and provide at future appointments . Patient will focus on medication adherence by continuing to use  his pill box  . Patient will take medications as prescribed . Patient will report any questions or concerns to provider   Please see past updates related to this goal by clicking on the "Past Updates" button in the selected goal         The patient verbalized understanding of instructions provided today and agreed to receive a mailed copy of patient instruction and/or educational  materials.  Plan:  - Scheduled f/u face to face appointment 05/22/19  Catie Darnelle Maffucci, PharmD, BCACP, [place] Pharmacist [place] [phone]

## 2019-04-22 ENCOUNTER — Other Ambulatory Visit: Payer: Self-pay | Admitting: Pharmacy Technician

## 2019-04-22 NOTE — Patient Outreach (Signed)
Shane Jones [facility]) Care Management  04/22/2019  Shane Jones 1945/08/14 [id number]                                       Medication Assistance Referral  Referral From: [place] RPh Catie T.   Medication/Company: [name] Patient application portion:  Mailed Provider application portion:  N/A Embedded RPh to have signed while in clinic to Dr. [name] Provider address/fax verified via: Office website  Medication/Company: [name] Patient application portion:  Mailed Provider application portion:  N/A Embedded RPh to have signed while in clinic to Dr. [name] Provider address/fax verified via: Office website   Follow up:  Will follow up with patient in 10-14 business days to confirm application(s) have been received.  [name], [name]  [phone]

## 2019-04-24 DIAGNOSIS — E1122 Type 2 diabetes mellitus with diabetic chronic kidney disease: Secondary | ICD-10-CM | POA: Diagnosis not present

## 2019-04-24 DIAGNOSIS — E876 Hypokalemia: Secondary | ICD-10-CM | POA: Diagnosis not present

## 2019-04-24 DIAGNOSIS — N2581 Secondary hyperparathyroidism of renal origin: Secondary | ICD-10-CM | POA: Diagnosis not present

## 2019-04-24 DIAGNOSIS — R809 Proteinuria, unspecified: Secondary | ICD-10-CM | POA: Diagnosis not present

## 2019-04-24 DIAGNOSIS — N1831 Chronic kidney disease, stage 3a: Secondary | ICD-10-CM | POA: Diagnosis not present

## 2019-04-24 DIAGNOSIS — I129 Hypertensive chronic kidney disease with stage 1 through stage 4 chronic kidney disease, or unspecified chronic kidney disease: Secondary | ICD-10-CM | POA: Diagnosis not present

## 2019-05-02 ENCOUNTER — Other Ambulatory Visit: Payer: Self-pay | Admitting: Pharmacy Technician

## 2019-05-02 NOTE — Patient Outreach (Signed)
[facility]) Care Management  05/02/2019  Shane Jones [birth year] [id number]   ADDENDUM   Incoming call from patient regarding patient assistance application(s) for Jardiance with [employer] and Trulicity with [employer] , HIPAA identifiers verified.   Patient informed he has received the applications and will work on getting them filled out and mailed back to me this weekend.  Follow up:  Will route note to embedded [facility] RPh Catie Darnelle Maffucci for case closure if document(s) have not been received in the next 15 business days.  [name], [place]  [phone]

## 2019-05-02 NOTE — Patient Outreach (Signed)
[facility]) Care Management  05/02/2019  Shane Jones 04/28/1945 [id number]   Unsuccessful call placed to patient regarding patient assistance application(s) for Jardiance with [employer] and Trulicity with [employer] , HIPAA compliant voicemail left.   Was calling patient to inquire if he has received the application that was mailed to him on 04/22/2019.  Follow up:  Will follow up with patient in 5-10 business days if call is not returned.  Suellen Durocher P. Kimika Streater, [place]  [phone]

## 2019-05-13 ENCOUNTER — Other Ambulatory Visit: Payer: Self-pay

## 2019-05-13 ENCOUNTER — Ambulatory Visit (INDEPENDENT_AMBULATORY_CARE_PROVIDER_SITE_OTHER): Payer: Medicare Other | Admitting: Gastroenterology

## 2019-05-13 ENCOUNTER — Encounter: Payer: Self-pay | Admitting: Gastroenterology

## 2019-05-13 VITALS — BP 158/89 | HR 55 | Ht 66.0 in | Wt 175.6 lb

## 2019-05-13 DIAGNOSIS — K219 Gastro-esophageal reflux disease without esophagitis: Secondary | ICD-10-CM

## 2019-05-13 DIAGNOSIS — Z1211 Encounter for screening for malignant neoplasm of colon: Secondary | ICD-10-CM

## 2019-05-13 MED ORDER — SUPREP BOWEL PREP KIT 17.5-3.13-1.6 GM/177ML PO SOLN: 1.0000 | ORAL | 0 refills | Status: DC

## 2019-05-13 NOTE — Progress Notes (Signed)
Primary Care Physician: Leone Haven, MD  Primary Gastroenterologist:  Dr. Lucilla Lame  Chief Complaint  Patient presents with  . Esophagitis    HPI: Shane Jones is a 74 y.o. male here history of esophageal strictures and esophagitis.  The patient had an EGD back in 2019 which showed him to have a stricture that was dilated with an 18 mm balloon.  He had an esophageal ulcer.  The patient was suffering from significant dysphagia at that time and heartburn.  The patient states he is well controlled on his medication now without any heartburn or dysphagia.  It appears that his last colonoscopy was done many years ago and he has not had a repeat colonoscopy.  Past Medical History:  Diagnosis Date  . Diabetes mellitus without complication ([place])   . Gout   . Hypertension   . Sleep apnea    has CPAP, doesn't use  . Wears dentures    full upper and lower    Current Outpatient Medications  Medication Sig Dispense Refill  . Accu-Chek FastClix Lancets MISC USE UP TO FOUR TIMES DAILY AS DIRECTED 102 each 2  . ACCU-CHEK GUIDE test strip USE UP TO FOUR TIMES DAILY AS DIRECTED 100 each 2  . allopurinol (ZYLOPRIM) 100 MG tablet TAKE 1 TABLET DAILY 90 tablet 03  . amLODipine (NORVASC) 10 MG tablet TAKE 1 TABLET EVERY DAY 90 tablet 2  . aspirin 325 MG tablet Take 325 mg by mouth daily.    Marland Kitchen atorvastatin (LIPITOR) 40 MG tablet TAKE 1 TABLET EVERY DAY 90 tablet 3  . B Complex Vitamins (VITAMIN-B COMPLEX PO) Take by mouth.    Marland Kitchen BETA CAROTENE PO Take by mouth.    . blood glucose meter kit and supplies Dispense based on patient and insurance preference. Use up to four times daily as directed. (FOR ICD-10 E10.9, E11.9). 1 each 0  . Calcium Carbonate-Vitamin D (CALCIUM + D PO) Take by mouth.    . doxazosin (CARDURA) 4 MG tablet     . Dulaglutide (TRULICITY) 3 AC/0.8LJ SOPN Inject 3 mg into the skin once a week. 12 pen 2  . empagliflozin (JARDIANCE) 25 MG TABS tablet Take 25 mg by mouth daily.  90 tablet 3  . escitalopram (LEXAPRO) 20 MG tablet TAKE 1 TABLET (20 MG) BY MOUTH DAILY 90 tablet 1  . furosemide (LASIX) 20 MG tablet Take one tablet (20 mg total) by mouth daily. 90 tablet 1  . gabapentin (NEURONTIN) 400 MG capsule TAKE 1 CAPSULE THREE TIMES DAILY 270 capsule 0  . glipiZIDE (GLUCOTROL) 5 MG tablet Take 1 tablet (5 mg total) by mouth 2 (two) times daily before a meal. 180 tablet 3  . hydrALAZINE (APRESOLINE) 10 MG tablet Take 1 tablet (10 mg total) by mouth 2 (two) times daily as needed. If BP >130/>80 take 1 pill up to 2x per day 60 tablet 2  . losartan (COZAAR) 100 MG tablet TAKE 1 TABLET EVERY DAY 90 tablet 2  . magnesium 30 MG tablet Take 30 mg by mouth 2 (two) times daily.    . metFORMIN (GLUCOPHAGE) 1000 MG tablet Take 1 tablet (1,000 mg total) by mouth 2 (two) times daily with a meal. 180 tablet 3  . metoprolol succinate (TOPROL-XL) 100 MG 24 hr tablet TAKE 1 TABLET EVERY DAY 90 tablet 1  . Multiple Vitamin (MULTIVITAMIN) tablet Take 1 tablet by mouth daily.    . pantoprazole (PROTONIX) 40 MG tablet Take 1 tablet (40  mg total) by mouth daily. 90 tablet 0  . potassium chloride (KLOR-CON) 10 MEQ tablet TAKE 1 TABLET BY MOUTH DAILY WITH LASIX 90 tablet 0  . Zinc Sulfate (ZINC 15 PO) Take by mouth.     No current facility-administered medications for this visit.    Allergies as of 05/13/2019 - Review Complete 05/13/2019  Allergen Reaction Noted  . Uloric [febuxostat] Hives 10/13/2010    ROS:  General: Negative for anorexia, weight loss, fever, chills, fatigue, weakness. ENT: Negative for hoarseness, difficulty swallowing , nasal congestion. CV: Negative for chest pain, angina, palpitations, dyspnea on exertion, peripheral edema.  Respiratory: Negative for dyspnea at rest, dyspnea on exertion, cough, sputum, wheezing.  GI: See history of present illness. GU:  Negative for dysuria, hematuria, urinary incontinence, urinary frequency, nocturnal urination.  Endo:  Negative for unusual weight change.    Physical Examination:   BP (!) 158/89   Pulse (!) 55   Ht 5' 6" (1.676 m)   Wt 175 lb 9.6 oz (79.7 kg)   BMI 28.34 kg/m   General: Well-nourished, well-developed in no acute distress.  Eyes: No icterus. Conjunctivae pink. Lungs: Clear to auscultation bilaterally. Non-labored. Heart: Regular rate and rhythm, no murmurs rubs or gallops.  Abdomen: Bowel sounds are normal, nontender, nondistended, no hepatosplenomegaly or masses, no abdominal bruits or hernia , no rebound or guarding.   Extremities: No lower extremity edema. No clubbing or deformities. Neuro: Alert and oriented x 3.  Grossly intact. Skin: Warm and dry, no jaundice.   Psych: Alert and cooperative, normal mood and affect.  Labs:    Imaging Studies: No results found.  Assessment and Plan:   [name] is a 74 y.o. y/o male who comes in today with a history of dysphagia with a history of heartburn.  The patient states that since having his esophagus dilated and being on a PPI he has not had any acid breakthrough and no dysphagia.  He states he eats whatever he wants.  There is no report of any unexplained weight loss fevers chills nausea vomiting black stools or bloody stools.  The patient is in need of a screening colonoscopy and will be set up for screening colonoscopy.  The patient has been explained the plan and agrees with it.     Lucilla Lame, MD. [place]al    Note: This dictation was prepared with Dragon dictation along with smaller phrase technology. Any transcriptional errors that result from this process are unintentional.

## 2019-05-14 ENCOUNTER — Other Ambulatory Visit: Payer: Self-pay

## 2019-05-14 DIAGNOSIS — I10 Essential (primary) hypertension: Secondary | ICD-10-CM

## 2019-05-14 NOTE — Telephone Encounter (Signed)
Please find out if he is taking this twice daily or twice daily as needed.  Please see what his blood pressures have been running.  Thanks.

## 2019-05-21 MED ORDER — HYDRALAZINE HCL 10 MG PO TABS: 10.0000 mg | ORAL_TABLET | Freq: Two times a day (BID) | ORAL | 2 refills | Status: DC | PRN

## 2019-05-21 NOTE — Telephone Encounter (Signed)
I called and spoke with the patient and he stated he only takes the hydralazine as needed, he stated his Bp has been constantly running 125-130/ 80 it has not went over that, he stated when it does he takes the medication. He also stated he notices if he does not take the medication it will run high sometimes.   Shane Jones,cma .

## 2019-05-22 ENCOUNTER — Ambulatory Visit (INDEPENDENT_AMBULATORY_CARE_PROVIDER_SITE_OTHER): Payer: Medicare Other | Admitting: Pharmacist

## 2019-05-22 ENCOUNTER — Other Ambulatory Visit: Payer: Self-pay

## 2019-05-22 VITALS — BP 106/72 | HR 57

## 2019-05-22 DIAGNOSIS — IMO0002 Reserved for concepts with insufficient information to code with codable children: Secondary | ICD-10-CM

## 2019-05-22 DIAGNOSIS — I10 Essential (primary) hypertension: Secondary | ICD-10-CM

## 2019-05-22 DIAGNOSIS — E1140 Type 2 diabetes mellitus with diabetic neuropathy, unspecified: Secondary | ICD-10-CM

## 2019-05-22 DIAGNOSIS — E1165 Type 2 diabetes mellitus with hyperglycemia: Secondary | ICD-10-CM | POA: Diagnosis not present

## 2019-05-22 LAB — POCT GLYCOSYLATED HEMOGLOBIN (HGB A1C): Hemoglobin A1C: 8.8 % — AB (ref 4.0–5.6)

## 2019-05-22 MED ORDER — TRULICITY 4.5 MG/0.5ML ~~LOC~~ SOAJ: 4.5000 mg | SUBCUTANEOUS | 3 refills | Status: DC

## 2019-05-22 NOTE — Chronic Care Management (AMB) (Addendum)
Chronic Care Management   Follow Up Note   05/22/2019 Name: Shane Jones MRN: [id number] DOB: 03-05-1945  Referred by: Leone Haven, MD Reason for referral : Chronic Care Management (Medication Management)   Shane Jones is a 74 y.o. year old male who is a primary care patient of Caryl Bis, Angela Adam, MD. The CCM team was consulted for assistance with chronic disease management and care coordination needs.    Met with patient face to face for medication management review.   Review of patient status, including review of consultants reports, relevant laboratory and other test results, and collaboration with appropriate care team members and the patient's provider was performed as part of comprehensive patient evaluation and provision of chronic care management services.    SDOH (Social Determinants of Health) assessments performed: Yes See Care Plan activities for detailed interventions related to [facility])     Outpatient Encounter Medications as of 05/22/2019  Medication Sig Note  . Accu-Chek FastClix Lancets MISC USE UP TO FOUR TIMES DAILY AS DIRECTED   . ACCU-CHEK GUIDE test strip USE UP TO FOUR TIMES DAILY AS DIRECTED   . allopurinol (ZYLOPRIM) 100 MG tablet TAKE 1 TABLET DAILY   . amLODipine (NORVASC) 10 MG tablet TAKE 1 TABLET EVERY DAY   . aspirin 325 MG tablet Take 325 mg by mouth daily.   Marland Kitchen atorvastatin (LIPITOR) 40 MG tablet TAKE 1 TABLET EVERY DAY   . B Complex Vitamins (VITAMIN-B COMPLEX PO) Take by mouth.   Marland Kitchen BETA CAROTENE PO Take by mouth.   . Calcium Carbonate-Vitamin D (CALCIUM + D PO) Take by mouth.   . doxazosin (CARDURA) 4 MG tablet    . empagliflozin (JARDIANCE) 25 MG TABS tablet Take 25 mg by mouth daily.   Marland Kitchen escitalopram (LEXAPRO) 20 MG tablet TAKE 1 TABLET (20 MG) BY MOUTH DAILY   . furosemide (LASIX) 20 MG tablet Take one tablet (20 mg total) by mouth daily.   Marland Kitchen gabapentin (NEURONTIN) 400 MG capsule TAKE 1 CAPSULE THREE TIMES DAILY   . glipiZIDE (GLUCOTROL) 5  MG tablet Take 1 tablet (5 mg total) by mouth 2 (two) times daily before a meal. 05/22/2019: Taking QAM   . losartan (COZAAR) 100 MG tablet TAKE 1 TABLET EVERY DAY   . magnesium 30 MG tablet Take 30 mg by mouth 2 (two) times daily.   . metFORMIN (GLUCOPHAGE) 1000 MG tablet Take 1 tablet (1,000 mg total) by mouth 2 (two) times daily with a meal.   . metoprolol succinate (TOPROL-XL) 100 MG 24 hr tablet TAKE 1 TABLET EVERY DAY   . pantoprazole (PROTONIX) 40 MG tablet Take 1 tablet (40 mg total) by mouth daily.   . potassium chloride (KLOR-CON) 10 MEQ tablet TAKE 1 TABLET BY MOUTH DAILY WITH LASIX   . Zinc Sulfate (ZINC 15 PO) Take by mouth.   . [DISCONTINUED] Dulaglutide (TRULICITY) 3 NL/8.9QJ SOPN Inject 3 mg into the skin once a week.   . blood glucose meter kit and supplies Dispense based on patient and insurance preference. Use up to four times daily as directed. (FOR ICD-10 E10.9, E11.9).   . Dulaglutide (TRULICITY) 4.5 JH/4.1DE SOPN Inject 4.5 mg as directed once a week.   . hydrALAZINE (APRESOLINE) 10 MG tablet Take 1 tablet (10 mg total) by mouth 2 (two) times daily as needed. If BP >130/>80 take 1 pill up to 2x per day   . Multiple Vitamin (MULTIVITAMIN) tablet Take 1 tablet by mouth daily.   Marland Kitchen  Na Sulfate-K Sulfate-Mg Sulf (SUPREP BOWEL PREP KIT) 17.5-3.13-1.6 GM/177ML SOLN Take 1 kit by mouth as directed.    No facility-administered encounter medications on file as of 05/22/2019.     Objective:   Goals Addressed            This Visit's Progress     Patient Stated   . "I want to keep my diabetes under control" (pt-stated)       [place] (see longtitudinal plan of care for additional care plan information)  Current Barriers:  . Diabetes: uncontrolled; most recent R6V 8.9%, complicated by HTN, CKD o Still due to reapply for BI and [employer] assistance for 2021 . Current antihyperglycemic regimen: metformin 1000 mg BID, glipizide 5 mg w/ big meal, Jardiance 25 mg QAM,  Trulicity 3 mg once weekly . Current meal patterns: o Eating more vegetables (brussell sprout, asaparagus, broccoli)  . Current glucose readings:  o Fasting: 110-160s per report, though only has readings written down from the last week of April . Cardiovascular risk reduction: o Current hypertensive regimen: losartan 100 mg QAM, metoprolol XL 100 mg QAM, amlodipine 10 mg daily QAM, doxazosin 4 mg QPM (recently added by Dr. Juleen China), hydralazine 10 mg PRN SBP 130/80, though unable to verbalize how often he has needed this - Has one home readings written down 124/77, HR 69 o Current hyperlipidemia regimen: atorvastatin 40 mg, LDL <70 on last check o Current antiplatelet regimen: ASA 325 mg daily; reports no hx CVA . Depression: escitalopram 20 mg daily . GERD: pantoprazole 40 mg daily, reports an upcoming colonoscopy and is concerned about the cost of the Suprep bowel prep being $115 on his [employer] insurance  Pharmacist Clinical Goal(s):  Marland Kitchen Over the next 90 days, patient with work with PharmD and primary care provider to address optimized medication management  Interventions: . Comprehensive medication review performed, medication list updated in electronic medical record . Inter-disciplinary care team collaboration (see longitudinal plan of care) . A1c improved today, but still not at goal. Recommend increasing Trulicity to 4.5 mg weekly. Will send updated dose prescription to [facility] patient assistance once patient completes application information and returns it to clinic.  . Praised patient for incorporation of more vegetables into his diet . BP low-normal today. Patient denies any lightheadedness, dizziness, now or when at home. Appears addition of doxazosin had a significant improvement in BP. Will have patient check and document BP at home to provide better evaluation of overall home control. Currently taking 3 antihypertensives in the morning and 1 at night - move losartan to the  evening. Patient verbalizes understanding.  Arlean Hopping for f/u with PCP. Patient has scheduled an appointment for next week. Encouraged to document BP over the next week and bring these readings with him to the appointment to allow for dose adjustment.  . Reiterated needs to reapply for [employer] and [employer] assistance programs. Patient will bring back signed portion of applications and copy of 2021 income from [place]. I will collaborate w/ Dr. Caryl Bis for provider portions (completed previously, but will update and adjust Trulicity dose).  . Will message Dr. Allen Norris and clinical staff regarding cost of Suprep.  Patient Self Care Activities:  . Patient will check blood glucose BID, document, and provide at future appointments . Patient will focus on medication adherence by continuing to use his pill box  . Patient will take medications as prescribed . Patient will report any questions or concerns to provider  Please see past updates related to this goal by clicking on the "Past Updates" button in the selected goal          Plan:  - Scheduled f/u call in 2 weeks for BP review  Catie Darnelle Maffucci, PharmD, [place], CPP Clinical Pharmacist [place] [phone]

## 2019-05-22 NOTE — Patient Instructions (Addendum)
Shane Jones,   Congratulations on your improvement in your A1c! It was 8.8% today!  Let's increase your Trulicity to 4.5 mg weekly.   Bring me the signed applications for Trulicity assistance through [place] and [place] assistance through [employer]. If you cannot find them, please call me and I will reprint them, and you can come back up here and sign. We also need proof of your income - so the statement from [place] that shows what you make each month.   Please move your losartan to the evenings.    Please check your blood sugar and blood pressure twice daily. I will call you in 2 weeks to review everything.        Here is how I would take your medications:   Morning: Allopurinol (gout) Amlodipine (blood pressure Metoprolol (blood pressure) Aspirin (stroke prevention) Jardiance (diabetes) Metformin (diabetes) Glipizide (diabetes( Furosemide (fluid pill) Potassium Atorvastatin (cholesterol) Escitalopram (mood)  Evening: Metformin (diabetes) Gabapentin (pain) Pantoprazole (acid reflux) Losartan (blood pressure) Doxazosin (blood pressure)  Visit Information  Goals Addressed            This Visit's Progress     Patient Stated   . "I want to keep my diabetes under control" (pt-stated)       [place] (see longtitudinal plan of care for additional care plan information)  Current Barriers:  . Diabetes: uncontrolled; most recent [id number], complicated by HTN, CKD o Still due to reapply for BI and [employer] assistance for 2021 . Current antihyperglycemic regimen: metformin 1000 mg BID, glipizide 5 mg w/ big meal, Jardiance 25 mg QAM, Trulicity 3 mg once weekly . Current meal patterns: o Eating more vegetables (brussell sprout, asaparagus, broccoli)  . Current glucose readings:  o Fasting: 110-160s per report, though only has readings written down from the last week of April . Cardiovascular risk reduction: o Current hypertensive regimen:  losartan 100 mg QAM, metoprolol XL 100 mg QAM, amlodipine 10 mg daily QAM, doxazosin 4 mg QPM (recently added by Dr. Juleen China), hydralazine 10 mg PRN SBP 130/80, though unable to verbalize how often he has needed this - Has one home readings written down 124/77, HR 69 o Current hyperlipidemia regimen: atorvastatin 40 mg, LDL <70 on last check o Current antiplatelet regimen: ASA 325 mg daily; reports no hx CVA . Depression: escitalopram 20 mg daily . GERD: pantoprazole 40 mg daily, reports an upcoming colonoscopy and is concerned about the cost of the Suprep bowel prep being $115 on his [employer] insurance  Pharmacist Clinical Goal(s):  Marland Kitchen Over the next 90 days, patient with work with PharmD and primary care provider to address optimized medication management  Interventions: . Comprehensive medication review performed, medication list updated in electronic medical record . Inter-disciplinary care team collaboration (see longitudinal plan of care) . A1c improved today, but still not at goal. Recommend increasing Trulicity to 4.5 mg weekly. Will send updated dose prescription to [employer] patient assistance once patient completes application information and returns it to clinic.  . Praised patient for incorporation of more vegetables into his diet . BP low-normal today. Patient denies any lightheadedness, dizziness, now or when at home. Appears addition of doxazosin had a significant improvement in BP. Will have patient check and document BP at home to provide better evaluation of overall home control. Currently taking 3 antihypertensives in the morning and 1 at night - move losartan to the evening. Patient verbalizes understanding.  Arlean Hopping for f/u with PCP. Patient has scheduled  an appointment for next week. Encouraged to document BP over the next week and bring these readings with him to the appointment to allow for dose adjustment.  . Reiterated needs to reapply for [employer] and [employer]  assistance programs. Patient will bring back signed portion of applications and copy of 2021 income from [place]. I will collaborate w/ Dr. Caryl Bis for provider portions (completed previously, but will update and adjust Trulicity dose).   Patient Self Care Activities:  . Patient will check blood glucose BID, document, and provide at future appointments . Patient will focus on medication adherence by continuing to use his pill box  . Patient will take medications as prescribed . Patient will report any questions or concerns to provider   Please see past updates related to this goal by clicking on the "Past Updates" button in the selected goal         The patient verbalized understanding of instructions provided today and agreed to receive a mailed copy of patient instruction and/or educational materials.  Plan:  - Scheduled f/u call in 2 weeks for BP review  Catie Darnelle Maffucci, PharmD, [place], CPP Clinical Pharmacist [place] [phone]

## 2019-05-23 ENCOUNTER — Telehealth: Payer: Self-pay

## 2019-05-23 ENCOUNTER — Other Ambulatory Visit: Payer: Self-pay

## 2019-05-23 MED ORDER — PEG 3350-KCL-NA BICARB-NACL 420 G PO SOLR
ORAL | 0 refills | Status: DC
Start: 2019-05-23 — End: 2019-09-19

## 2019-05-23 NOTE — Telephone Encounter (Signed)
Left vm informing pt I have sent a cheaper bowel prep to his pharmacy. Advised pt to contact me with any questions regarding the new prep.

## 2019-05-23 NOTE — Telephone Encounter (Signed)
-----   Message from De Hollingshead, [facility] sent at 05/22/2019 12:42 PM EDT ----- Dr. Allen Norris and Andrell Bergeson, Patient noted to me today that the Suprep bowel prep is going to be $115 with his insurance, and he can't afford that. Would there be an appropriate cheaper options? Thanks!Catie Darnelle Maffucci, PharmD, [employer], [place]

## 2019-05-30 ENCOUNTER — Telehealth (INDEPENDENT_AMBULATORY_CARE_PROVIDER_SITE_OTHER): Payer: Medicare Other | Admitting: Family Medicine

## 2019-05-30 ENCOUNTER — Encounter: Payer: Self-pay | Admitting: Family Medicine

## 2019-05-30 ENCOUNTER — Other Ambulatory Visit: Payer: Self-pay

## 2019-05-30 DIAGNOSIS — E1140 Type 2 diabetes mellitus with diabetic neuropathy, unspecified: Secondary | ICD-10-CM

## 2019-05-30 DIAGNOSIS — E663 Overweight: Secondary | ICD-10-CM | POA: Diagnosis not present

## 2019-05-30 DIAGNOSIS — E1165 Type 2 diabetes mellitus with hyperglycemia: Secondary | ICD-10-CM

## 2019-05-30 DIAGNOSIS — I10 Essential (primary) hypertension: Secondary | ICD-10-CM

## 2019-05-30 DIAGNOSIS — IMO0002 Reserved for concepts with insufficient information to code with codable children: Secondary | ICD-10-CM

## 2019-05-30 DIAGNOSIS — K209 Esophagitis, unspecified without bleeding: Secondary | ICD-10-CM | POA: Diagnosis not present

## 2019-05-30 NOTE — Assessment & Plan Note (Signed)
Encouraged continued exercise and activity.  Continue with healthier diet.

## 2019-05-30 NOTE — Assessment & Plan Note (Signed)
Uncontrolled.  Trulicity was recently increased.  He will continue his current regimen and will follow up in 3 months for recheck of A1c.  He will continue to see the clinical pharmacist.

## 2019-05-30 NOTE — Assessment & Plan Note (Signed)
Patient saw GI.  He will continue with Protonix.

## 2019-05-30 NOTE — Progress Notes (Signed)
Virtual Visit via telephone Note  This visit type was conducted due to national recommendations for restrictions regarding the COVID-19 pandemic (e.g. social distancing).  This format is felt to be most appropriate for this patient at this time.  All issues noted in this document were discussed and addressed.  No physical exam was performed (except for noted visual exam findings with Video Visits).   I connected with Shane Jones today at  2:45 PM EDT by telephone and verified that I am speaking with the correct person using two identifiers. Location patient: home Location provider: work Persons participating in the virtual visit: patient, provider  I discussed the limitations, risks, security and privacy concerns of performing an evaluation and management service by telephone and the availability of in person appointments. I also discussed with the patient that there may be a patient responsible charge related to this service. The patient expressed understanding and agreed to proceed.  Interactive audio and video telecommunications were attempted between this provider and patient, however failed, due to patient having technical difficulties OR patient did not have access to video capability.  We continued and completed visit with audio only.   Reason for visit: f/u  HPI: DIABETES Disease Monitoring: Blood Sugar ranges-145, notes glucose has come down some with diet changes, he has been eating a lot of brussel sprouts Polyuria/phagia/dipsia- no      Optho- reports he saw them in March 2021 Medications: Compliance- taking trulicity, jardiance, glipizide, metformin Hypoglycemic symptoms- no  HYPERTENSION  Disease Monitoring  Home BP Monitoring 131/81 Chest pain- no    Dyspnea- no Medications  Compliance-  Taking amlodipine, cardura, lasix, losartan, metoprolol, and prn hydralazine. Lightheadedness-  Reports one episode after leaving his visit with the clinical pharmacist and his BP was  106/72. No recurrence.  Edema- no  GERD: Well-controlled on Protonix.  Recently followed up with GI.  He had an EGD previously that had stenosis and an esophageal ulcer though has had no issues since going on his PPI.  They have scheduled him for a screening colonoscopy.  Overweight: Patient notes he has been doing more exercise with more yard work.  He is going to start going back to the gym.  He is cut down on sugar intake.  He is eating more vegetables.      ROS: See pertinent positives and negatives per HPI.  Past Medical History:  Diagnosis Date  . Diabetes mellitus without complication ([place])   . Gout   . Hypertension   . Sleep apnea    has CPAP, doesn't use  . Wears dentures    full upper and lower    Past Surgical History:  Procedure Laterality Date  . BALLOON DILATION N/A 10/22/2017   Procedure: BALLOON DILATION;  Surgeon: Lucilla Lame, MD;  Location: [facility];  Service: Endoscopy;  Laterality: N/A;  . ESOPHAGOGASTRODUODENOSCOPY (EGD) WITH PROPOFOL N/A 10/22/2017   Procedure: ESOPHAGOGASTRODUODENOSCOPY (EGD) WITH Biopsy;  Surgeon: Lucilla Lame, MD;  Location: [facility];  Service: Endoscopy;  Laterality: N/A;  . TONSILLECTOMY      No family history on file.  SOCIAL HX: Former smoker   Current Outpatient Medications:  .  Accu-Chek FastClix Lancets MISC, USE UP TO FOUR TIMES DAILY AS DIRECTED, Disp: 102 each, Rfl: 2 .  ACCU-CHEK GUIDE test strip, USE UP TO FOUR TIMES DAILY AS DIRECTED, Disp: 100 each, Rfl: 2 .  allopurinol (ZYLOPRIM) 100 MG tablet, TAKE 1 TABLET DAILY, Disp: 90 tablet, Rfl: 03 .  amLODipine (  NORVASC) 10 MG tablet, TAKE 1 TABLET EVERY DAY, Disp: 90 tablet, Rfl: 2 .  aspirin 325 MG tablet, Take 325 mg by mouth daily., Disp: , Rfl:  .  atorvastatin (LIPITOR) 40 MG tablet, TAKE 1 TABLET EVERY DAY, Disp: 90 tablet, Rfl: 3 .  B Complex Vitamins (VITAMIN-B COMPLEX PO), Take by mouth., Disp: , Rfl:  .  BETA CAROTENE PO, Take by mouth.,  Disp: , Rfl:  .  blood glucose meter kit and supplies, Dispense based on patient and insurance preference. Use up to four times daily as directed. (FOR ICD-10 E10.9, E11.9)., Disp: 1 each, Rfl: 0 .  Calcium Carbonate-Vitamin D (CALCIUM + D PO), Take by mouth., Disp: , Rfl:  .  doxazosin (CARDURA) 4 MG tablet, , Disp: , Rfl:  .  Dulaglutide (TRULICITY) 4.5 DG/3.8VF SOPN, Inject 4.5 mg as directed once a week., Disp: 12 pen, Rfl: 3 .  empagliflozin (JARDIANCE) 25 MG TABS tablet, Take 25 mg by mouth daily., Disp: 90 tablet, Rfl: 3 .  escitalopram (LEXAPRO) 20 MG tablet, TAKE 1 TABLET (20 MG) BY MOUTH DAILY, Disp: 90 tablet, Rfl: 1 .  furosemide (LASIX) 20 MG tablet, Take one tablet (20 mg total) by mouth daily., Disp: 90 tablet, Rfl: 1 .  gabapentin (NEURONTIN) 400 MG capsule, TAKE 1 CAPSULE THREE TIMES DAILY, Disp: 270 capsule, Rfl: 0 .  glipiZIDE (GLUCOTROL) 5 MG tablet, Take 1 tablet (5 mg total) by mouth 2 (two) times daily before a meal., Disp: 180 tablet, Rfl: 3 .  hydrALAZINE (APRESOLINE) 10 MG tablet, Take 1 tablet (10 mg total) by mouth 2 (two) times daily as needed. If BP >130/>80 take 1 pill up to 2x per day, Disp: 60 tablet, Rfl: 2 .  losartan (COZAAR) 100 MG tablet, TAKE 1 TABLET EVERY DAY, Disp: 90 tablet, Rfl: 2 .  magnesium 30 MG tablet, Take 30 mg by mouth 2 (two) times daily., Disp: , Rfl:  .  metFORMIN (GLUCOPHAGE) 1000 MG tablet, Take 1 tablet (1,000 mg total) by mouth 2 (two) times daily with a meal., Disp: 180 tablet, Rfl: 3 .  metoprolol succinate (TOPROL-XL) 100 MG 24 hr tablet, TAKE 1 TABLET EVERY DAY, Disp: 90 tablet, Rfl: 1 .  Multiple Vitamin (MULTIVITAMIN) tablet, Take 1 tablet by mouth daily., Disp: , Rfl:  .  Na Sulfate-K Sulfate-Mg Sulf (SUPREP BOWEL PREP KIT) 17.5-3.13-1.6 GM/177ML SOLN, Take 1 kit by mouth as directed., Disp: 354 mL, Rfl: 0 .  pantoprazole (PROTONIX) 40 MG tablet, Take 1 tablet (40 mg total) by mouth daily., Disp: 90 tablet, Rfl: 0 .  polyethylene  glycol-electrolytes (NULYTELY) 420 g solution, Drink one 8 oz glass every 20 mins until entire container is finished starting at 5:00pm on 06/16/19, Disp: 4000 mL, Rfl: 0 .  potassium chloride (KLOR-CON) 10 MEQ tablet, TAKE 1 TABLET BY MOUTH DAILY WITH LASIX, Disp: 90 tablet, Rfl: 0 .  Zinc Sulfate (ZINC 15 PO), Take by mouth., Disp: , Rfl:   EXAM: This was a telephone visit and thus no physical exam was completed.   ASSESSMENT AND PLAN:  Discussed the following assessment and plan:  Hypertension Seems to be adequately controlled for his age.  He did have an episode of lightheadedness recently.  He will monitor his blood pressure and if it trends down to the 643P over 29J systolically he will let us know.  He will continue his current regimen at this time.  Type 2 diabetes, uncontrolled, with neuropathy (HCC) Uncontrolled.  Trulicity was recently  increased.  He will continue his current regimen and will follow up in 3 months for recheck of A1c.  He will continue to see the clinical pharmacist.  Overweight Encouraged continued exercise and activity.  Continue with healthier diet.  Esophagitis Patient saw GI.  He will continue with Protonix.   Patient reports having had his COVID-19 vaccine.  He will contact us with the dates.  No orders of the defined types were placed in this encounter.   No orders of the defined types were placed in this encounter.    I discussed the assessment and treatment plan with the patient. The patient was provided an opportunity to ask questions and all were answered. The patient agreed with the plan and demonstrated an understanding of the instructions.   The patient was advised to call back or seek an in-person evaluation if the symptoms worsen or if the condition fails to improve as anticipated.  I provided 21 minutes of non-face-to-face time during this encounter.   [name], MD

## 2019-05-30 NOTE — Assessment & Plan Note (Signed)
Seems to be adequately controlled for his age.  He did have an episode of lightheadedness recently.  He will monitor his blood pressure and if it trends down to the [id number] over 0000000 systolically he will let us know.  He will continue his current regimen at this time.

## 2019-06-06 ENCOUNTER — Telehealth: Payer: Medicare Other

## 2019-06-09 ENCOUNTER — Ambulatory Visit (INDEPENDENT_AMBULATORY_CARE_PROVIDER_SITE_OTHER): Payer: Medicare Other | Admitting: Pharmacist

## 2019-06-09 DIAGNOSIS — IMO0002 Reserved for concepts with insufficient information to code with codable children: Secondary | ICD-10-CM

## 2019-06-09 DIAGNOSIS — I10 Essential (primary) hypertension: Secondary | ICD-10-CM

## 2019-06-09 DIAGNOSIS — E1140 Type 2 diabetes mellitus with diabetic neuropathy, unspecified: Secondary | ICD-10-CM | POA: Diagnosis not present

## 2019-06-09 DIAGNOSIS — E1165 Type 2 diabetes mellitus with hyperglycemia: Secondary | ICD-10-CM

## 2019-06-09 NOTE — Chronic Care Management (AMB) (Signed)
Chronic Care Management   Follow Up Note   06/09/2019 Name: Shane Jones MRN: [id number] DOB: 19-Feb-1945  Referred by: Shane Haven, MD Reason for referral : Chronic Care Management (Medication Management)   Shane Jones is a 74 y.o. year old male who is a primary care patient of Caryl Bis, Angela Adam, MD. The CCM team was consulted for assistance with chronic disease management and care coordination needs.    Contacted patient for medication management review.   Review of patient status, including review of consultants reports, relevant laboratory and other test results, and collaboration with appropriate care team members and the patient's provider was performed as part of comprehensive patient evaluation and provision of chronic care management services.    SDOH (Social Determinants of Health) assessments performed: No See Care Plan activities for detailed interventions related to [facility])     Outpatient Encounter Medications as of 06/09/2019  Medication Sig Note  . Accu-Chek FastClix Lancets MISC USE UP TO FOUR TIMES DAILY AS DIRECTED   . ACCU-CHEK GUIDE test strip USE UP TO FOUR TIMES DAILY AS DIRECTED   . allopurinol (ZYLOPRIM) 100 MG tablet TAKE 1 TABLET DAILY   . amLODipine (NORVASC) 10 MG tablet TAKE 1 TABLET EVERY DAY   . aspirin 325 MG tablet Take 325 mg by mouth daily.   Marland Kitchen atorvastatin (LIPITOR) 40 MG tablet TAKE 1 TABLET EVERY DAY   . blood glucose meter kit and supplies Dispense based on patient and insurance preference. Use up to four times daily as directed. (FOR ICD-10 E10.9, E11.9).   . Calcium Carbonate-Vitamin D (CALCIUM + D PO) Take by mouth.   . doxazosin (CARDURA) 4 MG tablet    . Dulaglutide (TRULICITY) 1.5 WN/4.6EV SOPN Inject into the skin.   Marland Kitchen empagliflozin (JARDIANCE) 25 MG TABS tablet Take 25 mg by mouth daily.   Marland Kitchen escitalopram (LEXAPRO) 20 MG tablet TAKE 1 TABLET (20 MG) BY MOUTH DAILY   . furosemide (LASIX) 20 MG tablet Take one tablet (20 mg total) by  mouth daily.   Marland Kitchen gabapentin (NEURONTIN) 400 MG capsule TAKE 1 CAPSULE THREE TIMES DAILY   . glipiZIDE (GLUCOTROL) 5 MG tablet Take 1 tablet (5 mg total) by mouth 2 (two) times daily before a meal. 05/22/2019: Taking QAM   . losartan (COZAAR) 100 MG tablet TAKE 1 TABLET EVERY DAY   . magnesium 30 MG tablet Take 30 mg by mouth 2 (two) times daily.   . metFORMIN (GLUCOPHAGE) 1000 MG tablet Take 1 tablet (1,000 mg total) by mouth 2 (two) times daily with a meal.   . metoprolol succinate (TOPROL-XL) 100 MG 24 hr tablet TAKE 1 TABLET EVERY DAY   . Multiple Vitamin (MULTIVITAMIN) tablet Take 1 tablet by mouth daily.   . pantoprazole (PROTONIX) 40 MG tablet Take 1 tablet (40 mg total) by mouth daily.   . potassium chloride (KLOR-CON) 10 MEQ tablet TAKE 1 TABLET BY MOUTH DAILY WITH LASIX   . Zinc Sulfate (ZINC 15 PO) Take by mouth.   . [DISCONTINUED] Dulaglutide (TRULICITY) 4.5 OJ/5.0KX SOPN Inject 4.5 mg as directed once a week. 03/10/1827: Trulicity 3 mg   . B Complex Vitamins (VITAMIN-B COMPLEX PO) Take by mouth.   Marland Kitchen BETA CAROTENE PO Take by mouth.   . hydrALAZINE (APRESOLINE) 10 MG tablet Take 1 tablet (10 mg total) by mouth 2 (two) times daily as needed. If BP >130/>80 take 1 pill up to 2x per day (Patient not taking: Reported on 06/09/2019)   .  Na Sulfate-K Sulfate-Mg Sulf (SUPREP BOWEL PREP KIT) 17.5-3.13-1.6 GM/177ML SOLN Take 1 kit by mouth as directed.   . polyethylene glycol-electrolytes (NULYTELY) 420 g solution Drink one 8 oz glass every 20 mins until entire container is finished starting at 5:00pm on 06/16/19    No facility-administered encounter medications on file as of 06/09/2019.     Objective:   Goals Addressed            This Visit's Progress     Patient Stated   . "I want to keep my diabetes under control" (pt-stated)       [place] (see longtitudinal plan of care for additional care plan information)  Current Barriers:  . Diabetes: uncontrolled; most recent O6Z 1.2%,  complicated by HTN, CKD o Still due to reapply for BI and [employer] assistance for 2021. Patient notes that he has ample medication supply of Trulicity and Jardiance, though suspicious of adherence, as these medications have not been filled through [facility] and he would not have received a supply from assistance since 2021. Last doses sent to PAP were Trulicity 1.5 mg and Jardiance 25 mg.  . Current antihyperglycemic regimen: metformin 1000 mg BID, glipizide 5 mg w/ big meal, Jardiance 25 mg QAM, Trulicity 1.5 mg - previous increased, but patient has not provided information for patient assistance reapplication . Current glucose readings:  o Has not checked since our last visit. Significant confusion about what I'm asking for when I ask for BG readings (ie, starts reading off BP readings).  . Cardiovascular risk reduction: o Current hypertensive regimen: losartan 100 mg QAM, metoprolol XL 100 mg QAM, amlodipine 10 mg daily QAM, doxazosin 4 mg QPM, hydralazine 10 mg PRN SBP 130/80, though notes he has not been using taht recently - Reports BG readings in the 140-150s/80s at home, though reporting readings as 2 separate readings, ~2 hours apart, that are the same (ie, will report "at 5:45 am, and then at 7:50 am, BP was 145/92") o Current hyperlipidemia regimen: atorvastatin 40 mg, LDL <70 on last check o Current antiplatelet regimen: ASA 325 mg daily; reports no hx CVA . Depression: escitalopram 20 mg daily . GERD: pantoprazole 40 mg daily, reports an upcoming colonoscopy, has not picked up prep kit yet from pharmacy since Dr. Dorothey Baseman   Pharmacist Clinical Goal(s):  Marland Kitchen Over the next 90 days, patient with work with PharmD and primary care provider to address optimized medication management  Interventions: . Comprehensive medication review performed, medication list updated in electronic medical record . Inter-disciplinary care team collaboration (see longitudinal plan of care) . Attempted to broach  suspicion of non-adherence if he still has a supply of patient assistance medications, but patient unwilling to discuss, noting that he has "plenty" of medication and "will get forms back as soon as possible". This is what he has been telling me for the past few months. Notes that he's lost his car keys, and as soon as he finds them, he will bring PAP paperwork by the office for me.  . Offered to provide samples or send prescriptions for these medications to [place] until we are able to secure PAP reapplications. Patient declines needing these medications sent to the pharmacy.  . Discussed that I was confused that he was giving me BP readings when I asked for BG, and confusion as to why home readings are so different than reading I obtained in clinic at last appointment. Patient unable to offer explanation. Denies any other episodes of  symptoms of hypotension. Encouraged patient to take home BP machine with him to the next face to face appointment (likely nephrology) for comparison. . Patient unwilling to really delve into how he feels his mental health, memory, concentration is doing, given death of his spouse this past. Scheduled closed f/u call in ~ 4 weeks.  . Reviewed upcoming appointment w/ nephrology, PCP, my f/u phone call. Patient wrote these dates and times down.   Patient Self Care Activities:  . Patient will check blood glucose BID, document, and provide at future appointments . Patient will focus on medication adherence by continuing to use his pill box  . Patient will take medications as prescribed . Patient will report any questions or concerns to provider   Please see past updates related to this goal by clicking on the "Past Updates" button in the selected goal          Plan:  - Scheduled f/u call in ~ 4 weeks  Catie Darnelle Maffucci, PharmD, [place], [place] Pharmacist [place] [phone]

## 2019-06-09 NOTE — Patient Instructions (Addendum)
Shane Jones,   Please continue checking blood pressures once daily, but re-start checking your blood sugars as well. Please check your blood sugar first thing in the morning, before you have eaten anything (even if you wake up late), and then 2 hours after supper.   It's been a while since the patient assistance companies last sent you your medications, I am afraid you are going to run out. Please let me know if we need to give you samples or send the prescriptions to [facility] while we work on re-applying for patient assistance.   We're here to help! Please let me know how I can.   You should be taking:  Morning: Allopurinol (gout) Amlodipine (blood pressure) Metoprolol (blood pressure) Aspirin (stroke prevention) Jardiance (diabetes) Metformin (diabetes) Glipizide (diabetes) Furosemide (fluid pill) Potassium Atorvastatin (cholesterol) Escitalopram (mood)  Evening: Metformin (diabetes) Gabapentin (pain) Pantoprazole (acid reflux) Losartan (blood pressure) Doxazosin (blood pressure)  See the next page for your currently scheduled appointments at our office.   Take care,  Catie Darnelle Maffucci, PharmD [phone]  Visit Information  Goals Addressed            This Visit's Progress     Patient Stated   . "I want to keep my diabetes under control" (pt-stated)       Shane Jones (see longtitudinal plan of care for additional care plan information)  Current Barriers:  . Diabetes: uncontrolled; most recent O8C 1.6%, complicated by HTN, CKD o Still due to reapply for BI and [employer] assistance for 2021. Patient notes that he has ample medication supply of Trulicity and Jardiance, though suspicious of adherence, as these medications have not been filled through [facility] and he would not have received a supply from assistance since 2021. Last doses sent to PAP were Trulicity 1.5 mg and Jardiance 25 mg.  . Current antihyperglycemic regimen: metformin 1000 mg BID, glipizide 5 mg w/ big  meal, Jardiance 25 mg QAM, Trulicity 1.5 mg - previous increased, but patient has not provided information for patient assistance reapplication . Current glucose readings:  o Has not checked since our last visit. Significant confusion about what I'm asking for when I ask for BG readings (ie, starts reading off BP readings).  . Cardiovascular risk reduction: o Current hypertensive regimen: losartan 100 mg QAM, metoprolol XL 100 mg QAM, amlodipine 10 mg daily QAM, doxazosin 4 mg QPM, hydralazine 10 mg PRN SBP 130/80, though notes he has not been using taht recently - Reports BG readings in the 140-150s/80s at home, though reporting readings as 2 separate readings, ~2 hours apart, that are the same (ie, will report "at 5:45 am, and then at 7:50 am, BP was 145/92") o Current hyperlipidemia regimen: atorvastatin 40 mg, LDL <70 on last check o Current antiplatelet regimen: ASA 325 mg daily; reports no hx CVA . Depression: escitalopram 20 mg daily . GERD: pantoprazole 40 mg daily, reports an upcoming colonoscopy, has not picked up prep kit yet from pharmacy since Dr. Dorothey Baseman   Pharmacist Clinical Goal(s):  Marland Kitchen Over the next 90 days, patient with work with PharmD and primary care provider to address optimized medication management  Interventions: . Comprehensive medication review performed, medication list updated in electronic medical record . Inter-disciplinary care team collaboration (see longitudinal plan of care) . Attempted to broach suspicion of non-adherence if he still has a supply of patient assistance medications, but patient unwilling to discuss, noting that he has "plenty" of medication and "will get forms back as soon as possible".  This is what he has been telling me for the past few months. Notes that he's lost his car keys, and as soon as he finds them, he will bring PAP paperwork by the office for me.  . Offered to provide samples or send prescriptions for these medications to [place]  until we are able to secure PAP reapplications. Patient declines needing these medications sent to the pharmacy.  . Discussed that I was confused that he was giving me BP readings when I asked for BG, and confusion as to why home readings are so different than reading I obtained in clinic at last appointment. Patient unable to offer explanation. Denies any other episodes of symptoms of hypotension. Encouraged patient to take home BP machine with him to the next face to face appointment (likely nephrology) for comparison. . Patient unwilling to really delve into how he feels his mental health, memory, concentration is doing, given death of his spouse this past. Scheduled closed f/u call in ~ 4 weeks.  . Reviewed upcoming appointment w/ nephrology, PCP, my f/u phone call. Patient wrote these dates and times down.   Patient Self Care Activities:  . Patient will check blood glucose BID, document, and provide at future appointments . Patient will focus on medication adherence by continuing to use his pill box  . Patient will take medications as prescribed . Patient will report any questions or concerns to provider   Please see past updates related to this goal by clicking on the "Past Updates" button in the selected goal         The patient verbalized understanding of instructions provided today and agreed to receive a mailed copy of patient instruction and/or educational materials.  Plan:  - Scheduled f/u call in ~ 4 weeks  Catie Darnelle Maffucci, PharmD, [place], [place] Pharmacist  [place]  [phone]

## 2019-06-13 ENCOUNTER — Other Ambulatory Visit: Admission: RE | Admit: 2019-06-13 | Payer: Medicare Other | Source: Ambulatory Visit

## 2019-06-17 ENCOUNTER — Encounter: Admission: RE | Payer: Self-pay | Source: Home / Self Care

## 2019-06-17 ENCOUNTER — Ambulatory Visit: Admission: RE | Admit: 2019-06-17 | Payer: Medicare Other | Source: Home / Self Care | Admitting: Gastroenterology

## 2019-06-17 SURGERY — COLONOSCOPY WITH PROPOFOL
Anesthesia: General

## 2019-06-18 ENCOUNTER — Telehealth: Payer: Self-pay | Admitting: Family Medicine

## 2019-06-18 NOTE — Telephone Encounter (Signed)
Patient called to let Dr. Caryl Bis know he had the Moderna vaccine; 03/20/19 lot # [id number], 04/17/2019 lot # [id number] [facility] . Patient will bring in vaccine card to make a copy for chart.

## 2019-07-08 DIAGNOSIS — R0602 Shortness of breath: Secondary | ICD-10-CM | POA: Diagnosis not present

## 2019-07-08 DIAGNOSIS — G4733 Obstructive sleep apnea (adult) (pediatric): Secondary | ICD-10-CM | POA: Diagnosis not present

## 2019-07-09 DIAGNOSIS — G4733 Obstructive sleep apnea (adult) (pediatric): Secondary | ICD-10-CM | POA: Diagnosis not present

## 2019-07-09 DIAGNOSIS — R0602 Shortness of breath: Secondary | ICD-10-CM | POA: Diagnosis not present

## 2019-07-29 ENCOUNTER — Telehealth: Payer: Self-pay | Admitting: *Deleted

## 2019-07-29 NOTE — Chronic Care Management (AMB) (Signed)
  Care Management   Note  07/29/2019 Name: Shane Jones MRN: [id number] DOB: [birth year]  Shane Jones is a 74 y.o. year old male who is a primary care patient of Leone Haven, MD and is actively engaged with the care management team. I reached out to Irean Hong by phone today to assist with re-scheduling a follow up visit with the Pharmacist.  Follow up plan: Telephone appointment with care management team member scheduled for: 07/31/2019 at 15:45  [place], [place], [place] [address] Direct Dial: [email or web address] Website: [email or web address]

## 2019-07-31 ENCOUNTER — Telehealth: Payer: Medicare Other

## 2019-07-31 ENCOUNTER — Ambulatory Visit (INDEPENDENT_AMBULATORY_CARE_PROVIDER_SITE_OTHER): Payer: Medicare Other | Admitting: Pharmacist

## 2019-07-31 DIAGNOSIS — I10 Essential (primary) hypertension: Secondary | ICD-10-CM

## 2019-07-31 DIAGNOSIS — IMO0002 Reserved for concepts with insufficient information to code with codable children: Secondary | ICD-10-CM

## 2019-07-31 DIAGNOSIS — E1140 Type 2 diabetes mellitus with diabetic neuropathy, unspecified: Secondary | ICD-10-CM

## 2019-07-31 DIAGNOSIS — E1165 Type 2 diabetes mellitus with hyperglycemia: Secondary | ICD-10-CM

## 2019-07-31 NOTE — Patient Instructions (Signed)
Visit Information  Goals Addressed              This Visit's Progress     Patient Stated   .  "I want to keep my diabetes under control" (pt-stated)        CARE PLAN ENTRY (see longtitudinal plan of care for additional care plan information)  Current Barriers:  . Social, financial, community barriers:  o S/p death of wife last October 03, 2022, dog in October, and recently his sister in Sports coach. Reports today that his air conditioning is also broken. Overall in good spirits during our call, but admits that he has lost a lot of motivation to care for himself. Forgets to check BG and notes he is forgetting other things around the house. Also notes that he has been barely sleeping since his wife passed away, reports that he has a hard time turning his brain off. . Diabetes: uncontrolled; most recent S8N 4.6%, complicated by HTN, CKD, depression  . Current antihyperglycemic regimen: metformin 1000 mg BID, glipizide 5 mg w/ big meal, Jardiance 25 mg QAM, Trulicity 1.5 mg - swears he has a ~2-3 month supply of Trulicity and Jardiance left. Notes that he received 90 day fills from his insurance around the same time he received 4 month supplies from [employer] last year.   . Cardiovascular risk reduction: o Current hypertensive regimen: losartan 100 mg QAM, metoprolol XL 100 mg QAM, amlodipine 10 mg daily QAM, doxazosin 4 mg QPM, hydralazine 10 mg PRN SBP >130 o Current hyperlipidemia regimen: atorvastatin 40 mg, LDL <70 on last check o Current antiplatelet regimen: ASA 325 mg daily; reports no hx CVA . Depression: escitalopram 20 mg daily, denies SI/HI today . GERD: pantoprazole 40 mg daily, cost concerns w/ colonoscopy prep, did not communicate these concerns to Dr. Allen Norris and instead canceled his colonoscopy   Pharmacist Clinical Goal(s):  Marland Kitchen Over the next 90 days, patient with work with PharmD and primary care provider to address optimized medication management  Interventions: . Comprehensive medication review  performed, medication list updated in electronic medical record . Inter-disciplinary care team collaboration (see longitudinal plan of care) . Patient requests that we re-mail patient assistance applications for Trulicity and Jardiance. Will collaborate w/ CPhT on this.  . Discussed collaboration from full team. Patient amenable. Placed referral for RN CM and LCSW for mental health support, grief support, diabetes self management education and support.  . Requested patient place his glucometer in a conspicuous place to remind him to check BID. He promises he will start doing this. . Discussed that his concerns w/ memory, sleep could be related to depression. Denies SI/HI. Encouraged to discuss w/ Dr. Caryl Bis at next appointment  Patient Self Care Activities:  . Patient will check blood glucose BID, document, and provide at future appointments . Patient will focus on medication adherence by continuing to use his pill box  . Patient will take medications as prescribed . Patient will report any questions or concerns to provider   Please see past updates related to this goal by clicking on the "Past Updates" button in the selected goal         The patient verbalized understanding of instructions provided today and declined a print copy of patient instruction materials.    Plan:  - Scheduled f/u call in ~ 3 weeks  Catie Darnelle Maffucci, PharmD, [place], [place] Pharmacist [place] [phone]

## 2019-07-31 NOTE — Chronic Care Management (AMB) (Signed)
Chronic Care Management   Follow Up Note   07/31/2019 Name: Shane Jones MRN: [id number] DOB: July 30, 1945  Referred by: Leone Haven, MD Reason for referral : Chronic Care Management (Medication Management)   Shane Jones is a 74 y.o. year old male who is a primary care patient of Caryl Bis, Angela Adam, MD. The CCM team was consulted for assistance with chronic disease management and care coordination needs.    Contacted patient for medication management review.   Review of patient status, including review of consultants reports, relevant laboratory and other test results, and collaboration with appropriate care team members and the patient's provider was performed as part of comprehensive patient evaluation and provision of chronic care management services.    SDOH (Social Determinants of Health) assessments performed: Yes See Care Plan activities for detailed interventions related to SDOH)  SDOH Interventions     Most Recent Value  SDOH Interventions  SDOH Interventions for the Following Domains Depression  Financial Strain Interventions Other (Comment)  [medication assistance support]  Social Connections Interventions Other (Comment)  [LCSW referral]  Depression Interventions/Treatment  Currently on Treatment, Counseling  [LCSW referral]       Outpatient Encounter Medications as of 07/31/2019  Medication Sig Note  . Dulaglutide (TRULICITY) 1.5 FT/7.3UK SOPN Inject into the skin.   Marland Kitchen empagliflozin (JARDIANCE) 25 MG TABS tablet Take 25 mg by mouth daily.   . Accu-Chek FastClix Lancets MISC USE UP TO FOUR TIMES DAILY AS DIRECTED   . ACCU-CHEK GUIDE test strip USE UP TO FOUR TIMES DAILY AS DIRECTED   . allopurinol (ZYLOPRIM) 100 MG tablet TAKE 1 TABLET DAILY   . amLODipine (NORVASC) 10 MG tablet TAKE 1 TABLET EVERY DAY   . aspirin 325 MG tablet Take 325 mg by mouth daily.   Marland Kitchen atorvastatin (LIPITOR) 40 MG tablet TAKE 1 TABLET EVERY DAY   . B Complex Vitamins (VITAMIN-B COMPLEX  PO) Take by mouth.   Marland Kitchen BETA CAROTENE PO Take by mouth.   . blood glucose meter kit and supplies Dispense based on patient and insurance preference. Use up to four times daily as directed. (FOR ICD-10 E10.9, E11.9).   . Calcium Carbonate-Vitamin D (CALCIUM + D PO) Take by mouth.   . doxazosin (CARDURA) 4 MG tablet    . escitalopram (LEXAPRO) 20 MG tablet TAKE 1 TABLET (20 MG) BY MOUTH DAILY   . furosemide (LASIX) 20 MG tablet Take one tablet (20 mg total) by mouth daily.   Marland Kitchen gabapentin (NEURONTIN) 400 MG capsule TAKE 1 CAPSULE THREE TIMES DAILY   . glipiZIDE (GLUCOTROL) 5 MG tablet Take 1 tablet (5 mg total) by mouth 2 (two) times daily before a meal. 05/22/2019: Taking QAM   . hydrALAZINE (APRESOLINE) 10 MG tablet Take 1 tablet (10 mg total) by mouth 2 (two) times daily as needed. If BP >130/>80 take 1 pill up to 2x per day (Patient not taking: Reported on 06/09/2019)   . losartan (COZAAR) 100 MG tablet TAKE 1 TABLET EVERY DAY   . magnesium 30 MG tablet Take 30 mg by mouth 2 (two) times daily.   . metFORMIN (GLUCOPHAGE) 1000 MG tablet Take 1 tablet (1,000 mg total) by mouth 2 (two) times daily with a meal.   . metoprolol succinate (TOPROL-XL) 100 MG 24 hr tablet TAKE 1 TABLET EVERY DAY   . Multiple Vitamin (MULTIVITAMIN) tablet Take 1 tablet by mouth daily.   . Na Sulfate-K Sulfate-Mg Sulf (SUPREP BOWEL PREP KIT) 17.5-3.13-1.6 GM/177ML SOLN Take  1 kit by mouth as directed.   . pantoprazole (PROTONIX) 40 MG tablet Take 1 tablet (40 mg total) by mouth daily.   . polyethylene glycol-electrolytes (NULYTELY) 420 g solution Drink one 8 oz glass every 20 mins until entire container is finished starting at 5:00pm on 06/16/19   . potassium chloride (KLOR-CON) 10 MEQ tablet TAKE 1 TABLET BY MOUTH DAILY WITH LASIX   . Zinc Sulfate (ZINC 15 PO) Take by mouth.    No facility-administered encounter medications on file as of 07/31/2019.     Objective:   Goals Addressed              This Visit's Progress       Patient Stated   .  "I want to keep my diabetes under control" (pt-stated)        CARE PLAN ENTRY (see longtitudinal plan of care for additional care plan information)  Current Barriers:  . Social, financial, community barriers:  o S/p death of wife last 09-Oct-2022, dog in October, and recently his sister in Sports [employer]. Reports today that his air conditioning is also broken. Overall in good spirits during our call, but admits that he has lost a lot of motivation to care for himself. Forgets to check BG and notes he is forgetting other things around the house. Also notes that he has been barely sleeping since his wife passed away, reports that he has a hard time turning his brain off. . Diabetes: uncontrolled; most recent F6C 1.2%, complicated by HTN, CKD, depression  . Current antihyperglycemic regimen: metformin 1000 mg BID, glipizide 5 mg w/ big meal, Jardiance 25 mg QAM, Trulicity 1.5 mg - swears he has a ~2-3 month supply of Trulicity and Jardiance left. Notes that he received 90 day fills from his insurance around the same time he received 4 month supplies from [employer] last year.   . Cardiovascular risk reduction: o Current hypertensive regimen: losartan 100 mg QAM, metoprolol XL 100 mg QAM, amlodipine 10 mg daily QAM, doxazosin 4 mg QPM, hydralazine 10 mg PRN SBP >130 o Current hyperlipidemia regimen: atorvastatin 40 mg, LDL <70 on last check o Current antiplatelet regimen: ASA 325 mg daily; reports no hx CVA . Depression: escitalopram 20 mg daily, denies SI/HI today . GERD: pantoprazole 40 mg daily, cost concerns w/ colonoscopy prep, did not communicate these concerns to Dr. Allen Norris and instead canceled his colonoscopy   Pharmacist Clinical Goal(s):  Marland Kitchen Over the next 90 days, patient with work with PharmD and primary care provider to address optimized medication management  Interventions: . Comprehensive medication review performed, medication list updated in electronic medical  record . Inter-disciplinary care team collaboration (see longitudinal plan of care) . Patient requests that we re-mail patient assistance applications for Trulicity and Jardiance. Will collaborate w/ CPhT on this.  . Discussed collaboration from full team. Patient amenable. Placed referral for RN CM and LCSW for mental health support, grief support, diabetes self management education and support.  . Requested patient place his glucometer in a conspicuous place to remind him to check BID. He promises he will start doing this. . Discussed that his concerns w/ memory, sleep could be related to depression. Denies SI/HI. Encouraged to discuss w/ Dr. Caryl Bis at next appointment  Patient Self Care Activities:  . Patient will check blood glucose BID, document, and provide at future appointments . Patient will focus on medication adherence by continuing to use his pill box  . Patient will take medications as prescribed . Patient  will report any questions or concerns to provider   Please see past updates related to this goal by clicking on the "Past Updates" button in the selected goal          Plan:  - Scheduled f/u call in ~ 3 weeks  Catie Darnelle Maffucci, PharmD, [place], [place] Pharmacist [place] [phone]

## 2019-08-01 ENCOUNTER — Other Ambulatory Visit: Payer: Self-pay | Admitting: Family Medicine

## 2019-08-01 ENCOUNTER — Other Ambulatory Visit: Payer: Self-pay

## 2019-08-01 ENCOUNTER — Other Ambulatory Visit: Payer: Self-pay | Admitting: *Deleted

## 2019-08-01 ENCOUNTER — Encounter: Payer: Self-pay | Admitting: *Deleted

## 2019-08-01 MED ORDER — GABAPENTIN 400 MG PO CAPS: ORAL_CAPSULE | ORAL | 0 refills | Status: DC

## 2019-08-01 NOTE — Patient Outreach (Signed)
[place] [facility]) Care Management  08/01/2019  AIME MELOCHE June 04, 1945 [id number]   CSW made an initial attempt to try and contact patient today to perform the phone assessment, as well as assess and assist with social work needs and services, without success.  A HIPAA compliant message was left for patient on voicemail.  CSW is currently awaiting a return call.  CSW will make a second outreach attempt within the next 3-4 business days, if a return call is not received from patient in the meantime.  CSW will also mail an Unsuccessful Patient Outreach Letter to patient's home, requesting that he contact CSW if he is interested in receiving social work services through [place] with [facility].  [name], BSW, MSW, LCSW  Licensed Education officer, environmental Health System  Mailing [place] [address], [place], [place] [address] Physical Address-[address], [place],  [address] Toll Free Main # [phone] Fax # [phone] Cell # [phone]  Office # [phone] [name]

## 2019-08-04 ENCOUNTER — Telehealth: Payer: Self-pay | Admitting: Family Medicine

## 2019-08-04 NOTE — Telephone Encounter (Signed)
Left message for patient to call back and schedule Medicare Annual Wellness Visit (AWV)  ° °This should be a telephone visit only=30 minutes. ° °No hx of AWV; please schedule at anytime with Denisa O'Brien-Blaney at [facility] ° ° °

## 2019-08-05 ENCOUNTER — Other Ambulatory Visit: Payer: Self-pay | Admitting: Pharmacy Technician

## 2019-08-05 NOTE — Patient Outreach (Signed)
[place] [facility]) Care Management  08/05/2019  Shane Jones Sep 03, 1945 [id number]                                      Medication Assistance Referral  Referral From: [facility] Embedded RPh Shane T.   Medication/Company: [name] Patient application portion:  Mailed Provider application portion:  N/A Embedded RPh had signed while in clinic to Dr. [name] Provider address/fax verified via: Office website  Medication/Company: Danelle Berry / Ralph Leyden Patient application portion:  Mailed Provider application portion:  N/A Embedded RPh had signed while in clinic to Dr. [name] Provider address/fax verified via: Office website  Med   Follow up:  Will follow up with patient in 5-15 business days to confirm application(s) have been received.  Shane Jones, [place]  [phone]

## 2019-08-06 ENCOUNTER — Other Ambulatory Visit: Payer: Self-pay

## 2019-08-06 ENCOUNTER — Other Ambulatory Visit: Payer: Self-pay | Admitting: *Deleted

## 2019-08-06 DIAGNOSIS — IMO0002 Reserved for concepts with insufficient information to code with codable children: Secondary | ICD-10-CM

## 2019-08-06 DIAGNOSIS — I10 Essential (primary) hypertension: Secondary | ICD-10-CM

## 2019-08-06 DIAGNOSIS — E785 Hyperlipidemia, unspecified: Secondary | ICD-10-CM

## 2019-08-06 DIAGNOSIS — K209 Esophagitis, unspecified without bleeding: Secondary | ICD-10-CM

## 2019-08-06 DIAGNOSIS — M1A9XX1 Chronic gout, unspecified, with tophus (tophi): Secondary | ICD-10-CM

## 2019-08-06 MED ORDER — AMLODIPINE BESYLATE 10 MG PO TABS: ORAL_TABLET | ORAL | 2 refills | Status: DC

## 2019-08-06 MED ORDER — ATORVASTATIN CALCIUM 40 MG PO TABS: ORAL_TABLET | ORAL | 3 refills | Status: DC

## 2019-08-06 MED ORDER — METOPROLOL SUCCINATE ER 100 MG PO TB24: ORAL_TABLET | ORAL | 1 refills | Status: DC

## 2019-08-06 MED ORDER — LOSARTAN POTASSIUM 100 MG PO TABS: ORAL_TABLET | ORAL | 2 refills | Status: DC

## 2019-08-06 MED ORDER — POTASSIUM CHLORIDE CRYS ER 10 MEQ PO TBCR: EXTENDED_RELEASE_TABLET | ORAL | 0 refills | Status: DC

## 2019-08-06 MED ORDER — PANTOPRAZOLE SODIUM 40 MG PO TBEC: 40.0000 mg | DELAYED_RELEASE_TABLET | Freq: Every day | ORAL | 0 refills | Status: DC

## 2019-08-06 MED ORDER — ALLOPURINOL 100 MG PO TABS: ORAL_TABLET | ORAL | Status: DC

## 2019-08-06 NOTE — Patient Outreach (Signed)
[facility]) Care Management  08/06/2019  ZEVEN KOCAK 18-Oct-1945 [id number]   CSW made a second attempt to try and contact patient today to perform phone assessment, as well as assess and assist with social work needs and services, without success.  A HIPAA compliant message was left for patient on voicemail.  CSW continues to await a return call.  CSW will make a third outreach attempt within the next 3-4 business days, on Wednesday, August 13, 2019, around 11:00am, if a return call is not received from patient in the meantime.    [name], BSW, MSW, LCSW  Licensed Education officer, environmental Health System  Mailing [place] [address], [place], [place] [address] Physical Address-[address], [place], [place] [address] Toll Free Main # [phone] Fax # [phone] Cell # [phone]  Office # [phone] [name]

## 2019-08-07 ENCOUNTER — Ambulatory Visit: Payer: Self-pay | Admitting: *Deleted

## 2019-08-07 ENCOUNTER — Other Ambulatory Visit: Payer: Self-pay

## 2019-08-08 NOTE — Patient Outreach (Signed)
[facility]) Care Management  08/08/2019  Shane Jones Aug 31, 1945 [id number]   Telephone assessment:  Initial outreach:  Placed call to patient with no answer.   PLAN: left a message requesting a call back. Outreach letter sent.  Tomasa Rand, RN, BSN, CEN [facility] [employer] [phone]

## 2019-08-13 ENCOUNTER — Other Ambulatory Visit: Payer: Self-pay | Admitting: *Deleted

## 2019-08-13 ENCOUNTER — Other Ambulatory Visit: Payer: Self-pay

## 2019-08-13 NOTE — Patient Outreach (Signed)
[facility]) Care Management  08/13/2019  Shane Jones 1945-03-26 [id number]    CSW made a third attempt to try and contact patient today to perform the initial phone assessment, as well as assess and assist with social work needs and services, without success.  A HIPAA compliant message was left for patient on voicemail.  CSW is currently awaiting a return call.  CSW will make a fourth and final outreach attempt within the next 4 weeks, if a return call is not received from patient in the meantime.  CSW will then proceed with case closure, if fourth attempt is unsuccessful, as required number of phone attempts will have been made, and an Unsuccessful Patient Outreach Letter mailed to patient's home, allowing more than a month for patient to respond if he was interested in receiving social work services and resources through [place] with Scientist, clinical (histocompatibility and immunogenetics).  [name], BSW, MSW, LCSW  Licensed Education officer, environmental Health System  Mailing [place] [address], [place], [place] [address] Physical Address-[address], [place], [place] [address] Toll Free Main # [phone] Fax # [phone] Cell # [phone]  Office # [phone] [name]

## 2019-08-13 NOTE — Patient Outreach (Signed)
[place] [facility]) Care Management  08/13/2019  Shane Jones 04/12/1945 [id number]    Telephone assessment: Place 2nd outreach call to patient No answer. Left a message a requested a call back.  PLAN: Will plan 3rd outreach call in 3-4 business days.  Tomasa Rand, RN, BSN, CEN [facility] [employer] [phone]

## 2019-08-18 ENCOUNTER — Other Ambulatory Visit: Payer: Self-pay | Admitting: Pharmacy Technician

## 2019-08-18 NOTE — Patient Outreach (Signed)
[facility]) Care Management  08/18/2019  TRAYCE MAINO 06/12/1945 [id number]   Unsuccessful call placed to patient regarding patient assistance application(s) for Trulicity with [employer] and Jardiance with [employer] , HIPAA compliant voicemail left.   Was calling to inquire if patient had received the applications that were mailed to him on 08/05/2019  Follow up:  Will follow up with 2nd outreach call in 5-7 business days if call is not returned.  Zlaty Alexa P. Jaxon Mynhier, [place]  [phone]

## 2019-08-19 ENCOUNTER — Ambulatory Visit (INDEPENDENT_AMBULATORY_CARE_PROVIDER_SITE_OTHER): Payer: Medicare Other | Admitting: Pharmacist

## 2019-08-19 ENCOUNTER — Other Ambulatory Visit: Payer: Self-pay

## 2019-08-19 DIAGNOSIS — IMO0002 Reserved for concepts with insufficient information to code with codable children: Secondary | ICD-10-CM

## 2019-08-19 DIAGNOSIS — E1140 Type 2 diabetes mellitus with diabetic neuropathy, unspecified: Secondary | ICD-10-CM

## 2019-08-19 DIAGNOSIS — F320 Major depressive disorder, single episode, mild: Secondary | ICD-10-CM

## 2019-08-19 NOTE — Chronic Care Management (AMB) (Signed)
Chronic Care Management   Follow Up Note   08/19/2019 Name: Shane Jones MRN: [id number] DOB: Apr 23, 1945  Referred by: Leone Haven, MD Reason for referral : Chronic Care Management (Medication Management)   Shane Jones is a 74 y.o. year old male who is a primary care patient of Caryl Bis, Angela Adam, MD. The CCM team was consulted for assistance with chronic disease management and care coordination needs.    Contacted patient for medication management review.   Review of patient status, including review of consultants reports, relevant laboratory and other test results, and collaboration with appropriate care team members and the patient's provider was performed as part of comprehensive patient evaluation and provision of chronic care management services.    SDOH (Social Determinants of Health) assessments performed: Yes See Care Plan activities for detailed interventions related to SDOH)  SDOH Interventions     Most Recent Value  SDOH Interventions  Financial Strain Interventions Other (Comment)  [medication assistance]  Social Connections Interventions Other (Comment)  [LCSW referral]  Depression Interventions/Treatment  Counseling       Outpatient Encounter Medications as of 08/19/2019  Medication Sig Note   Accu-Chek FastClix Lancets MISC USE UP TO FOUR TIMES DAILY AS DIRECTED    ACCU-CHEK GUIDE test strip USE UP TO FOUR TIMES DAILY AS DIRECTED    allopurinol (ZYLOPRIM) 100 MG tablet TAKE 1 TABLET DAILY    amLODipine (NORVASC) 10 MG tablet TAKE 1 TABLET EVERY DAY    aspirin 325 MG tablet Take 325 mg by mouth daily.    atorvastatin (LIPITOR) 40 MG tablet TAKE 1 TABLET EVERY DAY    B Complex Vitamins (VITAMIN-B COMPLEX PO) Take by mouth.    BETA CAROTENE PO Take by mouth.    blood glucose meter kit and supplies Dispense based on patient and insurance preference. Use up to four times daily as directed. (FOR ICD-10 E10.9, E11.9).    Calcium Carbonate-Vitamin D  (CALCIUM + D PO) Take by mouth.    doxazosin (CARDURA) 4 MG tablet     Dulaglutide (TRULICITY) 1.5 PI/9.5JO SOPN Inject into the skin.    empagliflozin (JARDIANCE) 25 MG TABS tablet Take 25 mg by mouth daily.    escitalopram (LEXAPRO) 20 MG tablet TAKE 1 TABLET (20 MG) BY MOUTH DAILY    furosemide (LASIX) 20 MG tablet Take one tablet (20 mg total) by mouth daily.    gabapentin (NEURONTIN) 400 MG capsule TAKE 1 CAPSULE THREE TIMES DAILY    glipiZIDE (GLUCOTROL) 5 MG tablet Take 1 tablet (5 mg total) by mouth 2 (two) times daily before a meal. 05/22/2019: Taking QAM    hydrALAZINE (APRESOLINE) 10 MG tablet Take 1 tablet (10 mg total) by mouth 2 (two) times daily as needed. If BP >130/>80 take 1 pill up to 2x per day (Patient not taking: Reported on 06/09/2019)    losartan (COZAAR) 100 MG tablet TAKE 1 TABLET EVERY DAY    magnesium 30 MG tablet Take 30 mg by mouth 2 (two) times daily.    metFORMIN (GLUCOPHAGE) 1000 MG tablet Take 1 tablet (1,000 mg total) by mouth 2 (two) times daily with a meal.    metoprolol succinate (TOPROL-XL) 100 MG 24 hr tablet TAKE 1 TABLET EVERY DAY    Multiple Vitamin (MULTIVITAMIN) tablet Take 1 tablet by mouth daily.    Na Sulfate-K Sulfate-Mg Sulf (SUPREP BOWEL PREP KIT) 17.5-3.13-1.6 GM/177ML SOLN Take 1 kit by mouth as directed.    pantoprazole (PROTONIX) 40 MG tablet Take  1 tablet (40 mg total) by mouth daily.    polyethylene glycol-electrolytes (NULYTELY) 420 g solution Drink one 8 oz glass every 20 mins until entire container is finished starting at 5:00pm on 06/16/19    potassium chloride (KLOR-CON) 10 MEQ tablet TAKE 1 TABLET BY MOUTH DAILY WITH LASIX    Zinc Sulfate (ZINC 15 PO) Take by mouth.    No facility-administered encounter medications on file as of 08/19/2019.     Objective:   Goals Addressed              This Visit's Progress     Patient Stated     "I want to keep my diabetes under control" (pt-stated)        CARE PLAN  ENTRY (see longtitudinal plan of care for additional care plan information)  Current Barriers:   Social, financial, community barriers:  o Notes that he has not answered any phone calls from numbers he doesn't know. Missed calls from RN CM, LCSW, and CPhT o Working on patient assistance for his medications, but notes that he is unsure if he has gotten the patient assistance paperwork in the mail  Diabetes: uncontrolled; most recent B1D 1.7%, complicated by HTN, CKD, depression   Current antihyperglycemic regimen: metformin 1000 mg BID, glipizide 5 mg w/ big meal, Jardiance 25 mg QAM, Trulicity 1.5 mg o Notes today that he is "tired of fighting" his blood sugars and "just give me the needles"  Cardiovascular risk reduction: o Current hypertensive regimen: losartan 100 mg QAM, metoprolol XL 100 mg QAM, amlodipine 10 mg daily QAM, doxazosin 4 mg QPM, hydralazine 10 mg PRN SBP >130 o Current hyperlipidemia regimen: atorvastatin 40 mg, LDL <70 on last check o Current antiplatelet regimen: ASA 325 mg daily; reports no hx CVA  Depression: escitalopram 20 mg daily, denies SI/HI today  GERD: pantoprazole 40 mg daily, cost concerns w/ colonoscopy prep, did not communicate these concerns to Dr. Allen Norris and instead canceled his colonoscopy   Pharmacist Clinical Goal(s):   Over the next 90 days, patient with work with PharmD and primary care provider to address optimized medication management  Interventions:  Comprehensive medication review performed, medication list updated in electronic medical record  Inter-disciplinary care team collaboration (see longitudinal plan of care)  Patient requests that I re-print the applications for patient assistance for Trulicity and Jardiance, and that he can come by the office to sign. Will do so today. Will also provide phone numbers for [employer] RN CM, LCSW, and CPhT so that he can program their numbers into his phone to answer when they call.   Discussed that he  is due for A1c w/ PCP appt week after next. Will plan for a co-visit at that time to see A1c and determine if we need to start insulin therapy vs GLP1 titration.   Patient Self Care Activities:   Patient will check blood glucose BID, document, and provide at future appointments  Patient will focus on medication adherence by continuing to use his pill box   Patient will take medications as prescribed  Patient will report any questions or concerns to provider   Please see past updates related to this goal by clicking on the "Past Updates" button in the selected goal          Plan:  - Scheduled f/u face to face in 2 weeks  Catie Darnelle Maffucci, PharmD, [place], [place] Pharmacist [place] [phone]

## 2019-08-19 NOTE — Patient Instructions (Addendum)
[name],   Here are my teammates. Please give them a call back:  Sharee Pimple, Education administrator; (helps me with patient assistance) - [phone] Estill Bamberg, Nurse Case Manager; (helps me with diabetes and health management) - [phone] [name], Licensed Clinical Social Worker; (helps with mental health needs, such as depression) - [phone]   See the next page for the times Estill Bamberg and [name] are planning on trying to call you again.   Please sign the attached papers on the highlighted spots and return to me.   Remember - I need a copy of proof of income. This can be your [employer] awards letter or your [phone] statement.   I look forward to seeing you with your appointment with Dr. Caryl Bis!  Catie Darnelle Maffucci, PharmD [phone]  Visit Information  Goals Addressed              This Visit's Progress     Patient Stated   .  "I want to keep my diabetes under control" (pt-stated)        CARE PLAN ENTRY (see longtitudinal plan of care for additional care plan information)  Current Barriers:  . Social, financial, community barriers:  o Notes that he has not answered any phone calls from numbers he doesn't know. Missed calls from RN CM, LCSW, and CPhT o Working on patient assistance for his medications, but notes that he is unsure if he has gotten the patient assistance paperwork in the mail . Diabetes: uncontrolled; most recent M0L 4.9%, complicated by HTN, CKD, depression  . Current antihyperglycemic regimen: metformin 1000 mg BID, glipizide 5 mg w/ big meal, Jardiance 25 mg QAM, Trulicity 1.5 mg o Notes today that he is "tired of fighting" his blood sugars and "just give me the needles" . Cardiovascular risk reduction: o Current hypertensive regimen: losartan 100 mg QAM, metoprolol XL 100 mg QAM, amlodipine 10 mg daily QAM, doxazosin 4 mg QPM, hydralazine 10 mg PRN SBP >130 o Current hyperlipidemia regimen: atorvastatin 40 mg, LDL <70 on last check o Current  antiplatelet regimen: ASA 325 mg daily; reports no hx CVA . Depression: escitalopram 20 mg daily, denies SI/HI today . GERD: pantoprazole 40 mg daily, cost concerns w/ colonoscopy prep, did not communicate these concerns to Dr. Allen Norris and instead canceled his colonoscopy   Pharmacist Clinical Goal(s):  Marland Kitchen Over the next 90 days, patient with work with PharmD and primary care provider to address optimized medication management  Interventions: . Comprehensive medication review performed, medication list updated in electronic medical record . Inter-disciplinary care team collaboration (see longitudinal plan of care) . Patient requests that I re-print the applications for patient assistance for Trulicity and Jardiance, and that he can come by the office to sign. Will do so today. Will also provide phone numbers for [facility] RN CM, LCSW, and CPhT so that he can program their numbers into his phone to answer when they call.  . Discussed that he is due for A1c w/ PCP appt week after next. Will plan for a co-visit at that time to see A1c and determine if we need to start insulin therapy vs GLP1 titration.   Patient Self Care Activities:  . Patient will check blood glucose BID, document, and provide at future appointments . Patient will focus on medication adherence by continuing to use his pill box  . Patient will take medications as prescribed . Patient will report any questions or concerns to provider   Please see past updates related to this goal  by clicking on the "Past Updates" button in the selected goal         Print copy of patient instructions provided.   Plan:  - Scheduled f/u face to face in 2 weeks  Catie Darnelle Maffucci, PharmD, [place], [place] [phone]

## 2019-08-19 NOTE — Patient Outreach (Signed)
  [place] [facility]) Care Management  08/19/2019  EVREN SHANKLAND February 01, 1945 [id number]   Telephone assessment:  3rd call to patient with no answer. Left a message requesting a call back.  PLAN: if no response will attempt another outreach in 4 weeks.   Tomasa Rand, RN, BSN, CEN [facility] [employer] [phone]

## 2019-08-27 ENCOUNTER — Other Ambulatory Visit: Payer: Self-pay | Admitting: Pharmacy Technician

## 2019-08-27 NOTE — Patient Outreach (Signed)
[facility] Care Management  08/27/2019  Shane Jones Nov 03, 1945 [id number]   Unsuccessful call placed to patient regarding patient assistance application(s) for Jardiance with [employer] and Trulicity with [employer] , HIPAA compliant voicemail left.   Was calling to inquire if patient received the applications that were mailed to him on 08/05/2019.  Follow up:  Patient has appointment with embedded RPh on 09/01/2019. Will await outcome of that appointment.  Neely Kammerer P. Jacci Ruberg, [place]  [phone]

## 2019-09-01 ENCOUNTER — Encounter: Payer: Self-pay | Admitting: Family Medicine

## 2019-09-01 ENCOUNTER — Other Ambulatory Visit: Payer: Self-pay

## 2019-09-01 ENCOUNTER — Ambulatory Visit: Payer: Medicare Other | Admitting: Pharmacist

## 2019-09-01 ENCOUNTER — Ambulatory Visit (INDEPENDENT_AMBULATORY_CARE_PROVIDER_SITE_OTHER): Payer: Medicare Other | Admitting: Family Medicine

## 2019-09-01 DIAGNOSIS — I10 Essential (primary) hypertension: Secondary | ICD-10-CM

## 2019-09-01 DIAGNOSIS — G629 Polyneuropathy, unspecified: Secondary | ICD-10-CM

## 2019-09-01 DIAGNOSIS — E1140 Type 2 diabetes mellitus with diabetic neuropathy, unspecified: Secondary | ICD-10-CM | POA: Diagnosis not present

## 2019-09-01 DIAGNOSIS — E1165 Type 2 diabetes mellitus with hyperglycemia: Secondary | ICD-10-CM | POA: Diagnosis not present

## 2019-09-01 DIAGNOSIS — E785 Hyperlipidemia, unspecified: Secondary | ICD-10-CM | POA: Diagnosis not present

## 2019-09-01 DIAGNOSIS — F32A Depression, unspecified: Secondary | ICD-10-CM

## 2019-09-01 DIAGNOSIS — IMO0002 Reserved for concepts with insufficient information to code with codable children: Secondary | ICD-10-CM

## 2019-09-01 NOTE — Assessment & Plan Note (Signed)
Continues to be uncontrolled.  We will check an A1c.  We will continue his current regimen.  Discussed that he really needs to try to get 3 meals a day with his diabetes.  Discussed that he could do 1500-calorie diet.  Advised to monitor his feet and if he develops any wounds that are nonhealing or any signs of infection he needs to let us know.

## 2019-09-01 NOTE — Chronic Care Management (AMB) (Addendum)
Chronic Care Management   Follow Up Note   09/01/2019 Name: BELFORD PASCUCCI MRN: [id number] DOB: [birth year]  Referred by: Leone Haven, MD Reason for referral : Chronic Care Management (Medication Management)   Shane Jones is a 74 y.o. year old male who is a primary care patient of Caryl Bis, Angela Adam, MD. The CCM team was consulted for assistance with chronic disease management and care coordination needs.    Met with patient face to face for medication management review.   Review of patient status, including review of consultants reports, relevant laboratory and other test results, and collaboration with appropriate care team members and the patient's provider was performed as part of comprehensive patient evaluation and provision of chronic care management services.    SDOH (Social Determinants of Health) assessments performed: Yes See Care Plan activities for detailed interventions related to SDOH)  SDOH Interventions     Most Recent Value  SDOH Interventions  Financial Strain Interventions Other (Comment)  [manufacturer assistance]       Outpatient Encounter Medications as of 09/01/2019  Medication Sig Note   Accu-Chek FastClix Lancets MISC USE UP TO FOUR TIMES DAILY AS DIRECTED    ACCU-CHEK GUIDE test strip USE UP TO FOUR TIMES DAILY AS DIRECTED    allopurinol (ZYLOPRIM) 100 MG tablet TAKE 1 TABLET DAILY    amLODipine (NORVASC) 10 MG tablet TAKE 1 TABLET EVERY DAY    aspirin 325 MG tablet Take 325 mg by mouth daily.    atorvastatin (LIPITOR) 40 MG tablet TAKE 1 TABLET EVERY DAY    B Complex Vitamins (VITAMIN-B COMPLEX PO) Take by mouth.    BETA CAROTENE PO Take by mouth.    blood glucose meter kit and supplies Dispense based on patient and insurance preference. Use up to four times daily as directed. (FOR ICD-10 E10.9, E11.9).    Calcium Carbonate-Vitamin D (CALCIUM + D PO) Take by mouth.    doxazosin (CARDURA) 4 MG tablet     Dulaglutide (TRULICITY) 1.5  OI/7.1IW SOPN Inject into the skin.    empagliflozin (JARDIANCE) 25 MG TABS tablet Take 25 mg by mouth daily.    escitalopram (LEXAPRO) 20 MG tablet TAKE 1 TABLET (20 MG) BY MOUTH DAILY    furosemide (LASIX) 20 MG tablet Take one tablet (20 mg total) by mouth daily.    gabapentin (NEURONTIN) 400 MG capsule TAKE 1 CAPSULE THREE TIMES DAILY    glipiZIDE (GLUCOTROL) 5 MG tablet Take 1 tablet (5 mg total) by mouth 2 (two) times daily before a meal. 05/22/2019: Taking QAM    hydrALAZINE (APRESOLINE) 10 MG tablet Take 1 tablet (10 mg total) by mouth 2 (two) times daily as needed. If BP >130/>80 take 1 pill up to 2x per day    losartan (COZAAR) 100 MG tablet TAKE 1 TABLET EVERY DAY    magnesium 30 MG tablet Take 30 mg by mouth 2 (two) times daily.    metFORMIN (GLUCOPHAGE) 1000 MG tablet Take 1 tablet (1,000 mg total) by mouth 2 (two) times daily with a meal.    metoprolol succinate (TOPROL-XL) 100 MG 24 hr tablet TAKE 1 TABLET EVERY DAY    Multiple Vitamin (MULTIVITAMIN) tablet Take 1 tablet by mouth daily.    Na Sulfate-K Sulfate-Mg Sulf (SUPREP BOWEL PREP KIT) 17.5-3.13-1.6 GM/177ML SOLN Take 1 kit by mouth as directed.    pantoprazole (PROTONIX) 40 MG tablet Take 1 tablet (40 mg total) by mouth daily.    polyethylene glycol-electrolytes (NULYTELY) 420 g  solution Drink one 8 oz glass every 20 mins until entire container is finished starting at 5:00pm on 06/16/19    potassium chloride (KLOR-CON) 10 MEQ tablet TAKE 1 TABLET BY MOUTH DAILY WITH LASIX    Zinc Sulfate (ZINC 15 PO) Take by mouth.    No facility-administered encounter medications on file as of 09/01/2019.     Objective:   Goals Addressed              This Visit's Progress     Patient Stated     "I want to keep my diabetes under control" (pt-stated)         CARE PLAN ENTRY (see longtitudinal plan of care for additional care plan information)  Current Barriers:   Social, financial, community barriers:  o Notes  that he has not answered any phone calls from numbers he doesn't know. Missed calls from RN CM, LCSW, and CPhT o Needs to reapply for assistance for Trulicity and Jardiance o Weight loss. Reports fasting sugar this morning was 415. Patient inquired about use of CGM  Diabetes: uncontrolled; most recent W2H 8.5%, complicated by HTN, CKD, depression   Current antihyperglycemic regimen: metformin 1000 mg BID, glipizide 5 mg w/ big meal, Jardiance 25 mg QAM, Trulicity 1.5 mg x 2 weekly;   Reports symptoms of polyuria,   Cardiovascular risk reduction: o Current hypertensive regimen: losartan 100 mg QAM, metoprolol XL 100 mg QAM, amlodipine 10 mg daily QAM, doxazosin 4 mg QPM, hydralazine 10 mg PRN SBP >130; BP at goal in office today o Current hyperlipidemia regimen: atorvastatin 40 mg, LDL <70 on last check o Current antiplatelet regimen: ASA 325 mg daily; reports no hx CVA  Depression: escitalopram 20 mg daily  GERD: pantoprazole 40 mg daily  Pharmacist Clinical Goal(s):   Over the next 90 days, patient with work with PharmD and primary care provider to address optimized medication management  Interventions:  Comprehensive medication review performed, medication list updated in electronic medical record  Inter-disciplinary care team collaboration (see longitudinal plan of care)  Collected patient signature for [employer] and [employer] patient assistance applications for [employer] and Trulicity, respectively.   Provided directed phone numbers and upcoming appointment schedule for RN CM and LCSW.   Labs today. Will determine whether to start Basaglar vs titrate Trulicity pending I7P result.   Patient to bring proof of income (Radio broadcast assistant) by the clinic later this week.  Reviewed that Medicare only covers CGM if patient is injecting at least 3 shots of insulin daily. Discussed that cost without insurance is $75 per month. He will think about this.   Patient Self Care  Activities:   Patient will check blood glucose BID, document, and provide at future appointments  Patient will focus on medication adherence by continuing to use his pill box   Patient will take medications as prescribed  Patient will report any questions or concerns to provider   Please see past updates related to this goal by clicking on the "Past Updates" button in the selected goal          Plan:  - Scheduled f/u call in ~4 weeks  Catie Darnelle Maffucci, PharmD, [place], [place] Pharmacist [place] [phone]

## 2019-09-01 NOTE — Assessment & Plan Note (Signed)
Check lipid panel  

## 2019-09-01 NOTE — Assessment & Plan Note (Signed)
Chronic issue.  Reports wearing socks to bed resolve his symptoms.  He will continue that and monitor.

## 2019-09-01 NOTE — Progress Notes (Signed)
[name], MD Phone: [phone]  Shane Jones is a 74 y.o. male who presents today for f/u.  DIABETES Disease Monitoring: Blood Sugar ranges-not checking consistently though notes it was 415 earlier today Polyuria/phagia/dipsia- no      Optho- reports he has seen them in the past year Medications: Compliance- taking trulicity, jardiance, glipizide, metformin Hypoglycemic symptoms- no  HYPERTENSION  Disease Monitoring  Home BP Monitoring [id number] systolic Chest pain- no    Dyspnea- no Medications  Compliance-  Taking losartan, metoprolol, cardura, amlodipine, lasix.   Edema- no  Overweight: Patient notes he is drastically changed his diet.  He is only eating 1 meal per day.  He is not snacking.  He is drinking large volumes of water compared to his prior baseline.  He is working outside more.  He is doing this in an attempt to lose weight.     Social History   Tobacco Use  Smoking Status Former Smoker  . Quit date: 11/20/1978  . Years since quitting: 40.8  Smokeless Tobacco Never Used     ROS see history of present illness  Objective  Physical Exam Vitals:   09/01/19 1519  BP: 120/80  Pulse: 90  Temp: 99.5 F (37.5 C)  SpO2: 97%    BP Readings from Last 3 Encounters:  09/01/19 120/80  05/22/19 106/72  05/13/19 (!) 158/89   Wt Readings from Last 3 Encounters:  09/01/19 160 lb (72.6 kg)  05/30/19 175 lb (79.4 kg)  05/13/19 175 lb 9.6 oz (79.7 kg)    Physical Exam Constitutional:      General: He is not in acute distress.    Appearance: He is not diaphoretic.  Cardiovascular:     Rate and Rhythm: Normal rate and regular rhythm.     Heart sounds: Normal heart sounds.  Pulmonary:     Effort: Pulmonary effort is normal.     Breath sounds: Normal breath sounds.  Musculoskeletal:     Left lower leg: No edema.  Skin:    General: Skin is warm and dry.  Neurological:     Mental Status: He is alert.    Diabetic Foot Exam - Simple   Simple Foot  Form Diabetic Foot exam was performed with the following findings: Yes 09/01/2019  3:40 PM  Visual Inspection See comments: Yes Sensation Testing Intact to touch and monofilament testing bilaterally: Yes Pulse Check Posterior Tibialis and Dorsalis pulse intact bilaterally: Yes Comments Onychomycosis throughout with unclipped toenails, no ulcerations or skin breakdown, there is skin flaking between his toes     Assessment/Plan: Please see individual problem list.  Hypertension Well-controlled here today though his blood pressure seems to be elevated at home.  We will check lab work prior to determining any potential changes.  Type 2 diabetes, uncontrolled, with neuropathy ([place]) Continues to be uncontrolled.  We will check an A1c.  We will continue his current regimen.  Discussed that he really needs to try to get 3 meals a day with his diabetes.  Discussed that he could do 1500-calorie diet.  Advised to monitor his feet and if he develops any wounds that are nonhealing or any signs of infection he needs to let us know.  Neuropathy (HCC) Chronic issue.  Reports wearing socks to bed resolve his symptoms.  He will continue that and monitor.  Hyperlipidemia Check lipid panel.    Orders Placed This Encounter  Procedures  . HgB A1c  . Comp Met (CMET)  . Lipid panel  . Urine  Microalbumin w/creat. ratio    No orders of the defined types were placed in this encounter.   This visit occurred during the SARS-CoV-2 public health emergency.  Safety protocols were in place, including screening questions prior to the visit, additional usage of staff PPE, and extensive cleaning of exam room while observing appropriate contact time as indicated for disinfecting solutions.    [name], MD [place]

## 2019-09-01 NOTE — Patient Instructions (Signed)
Nice to see you. We will get lab work today. Please try to make sure you eat something for breakfast, lunch, and dinner.  You should get around 1500 or slightly more calories per day.

## 2019-09-01 NOTE — Assessment & Plan Note (Signed)
Well-controlled here today though his blood pressure seems to be elevated at home.  We will check lab work prior to determining any potential changes.

## 2019-09-01 NOTE — Patient Instructions (Signed)
Visit Information  Goals Addressed              This Visit's Progress     Patient Stated   .  "I want to keep my diabetes under control" (pt-stated)         CARE PLAN ENTRY (see longtitudinal plan of care for additional care plan information)  Current Barriers:  . Social, financial, community barriers:  o Notes that he has not answered any phone calls from numbers he doesn't know. Missed calls from RN CM, LCSW, and CPhT o Needs to reapply for assistance for Trulicity and Jardiance o Weight loss. Reports fasting sugar this morning was 415 . Diabetes: uncontrolled; most recent E0F 0.0%, complicated by HTN, CKD, depression  . Current antihyperglycemic regimen: metformin 1000 mg BID, glipizide 5 mg w/ big meal, Jardiance 25 mg QAM, Trulicity 1.5 mg x 2 weekly;  . Reports symptoms of polyuria,  . Cardiovascular risk reduction: o Current hypertensive regimen: losartan 100 mg QAM, metoprolol XL 100 mg QAM, amlodipine 10 mg daily QAM, doxazosin 4 mg QPM, hydralazine 10 mg PRN SBP >130; BP at goal in office today o Current hyperlipidemia regimen: atorvastatin 40 mg, LDL <70 on last check o Current antiplatelet regimen: ASA 325 mg daily; reports no hx CVA . Depression: escitalopram 20 mg daily . GERD: pantoprazole 40 mg daily  Pharmacist Clinical Goal(s):  Marland Kitchen Over the next 90 days, patient with work with PharmD and primary care provider to address optimized medication management  Interventions: . Comprehensive medication review performed, medication list updated in electronic medical record . Inter-disciplinary care team collaboration (see longitudinal plan of care) . Collected patient signature for [employer] and [employer] patient assistance applications for [employer], respectively.  . Provided directed phone numbers and upcoming appointment schedule for RN CM and LCSW.  . Labs today. Will determine whether to start Basaglar vs titrate Trulicity pending F1Q result.  . Patient to  bring proof of income (Radio broadcast assistant) by the clinic later this week.   Patient Self Care Activities:  . Patient will check blood glucose BID, document, and provide at future appointments . Patient will focus on medication adherence by continuing to use his pill box  . Patient will take medications as prescribed . Patient will report any questions or concerns to provider   Please see past updates related to this goal by clicking on the "Past Updates" button in the selected goal         The patient verbalized understanding of instructions provided today and declined a print copy of patient instruction materials.    Plan:  - Scheduled f/u call in ~4 weeks  Catie Darnelle Maffucci, PharmD, [place], [place] Pharmacist [place] [phone]

## 2019-09-02 ENCOUNTER — Ambulatory Visit: Payer: Medicare Other | Admitting: Pharmacist

## 2019-09-02 ENCOUNTER — Telehealth: Payer: Self-pay | Admitting: *Deleted

## 2019-09-02 DIAGNOSIS — E1165 Type 2 diabetes mellitus with hyperglycemia: Secondary | ICD-10-CM

## 2019-09-02 DIAGNOSIS — F320 Major depressive disorder, single episode, mild: Secondary | ICD-10-CM

## 2019-09-02 DIAGNOSIS — IMO0002 Reserved for concepts with insufficient information to code with codable children: Secondary | ICD-10-CM

## 2019-09-02 DIAGNOSIS — E1140 Type 2 diabetes mellitus with diabetic neuropathy, unspecified: Secondary | ICD-10-CM | POA: Diagnosis not present

## 2019-09-02 DIAGNOSIS — I10 Essential (primary) hypertension: Secondary | ICD-10-CM

## 2019-09-02 DIAGNOSIS — F329 Major depressive disorder, single episode, unspecified: Secondary | ICD-10-CM

## 2019-09-02 DIAGNOSIS — F419 Anxiety disorder, unspecified: Secondary | ICD-10-CM | POA: Diagnosis not present

## 2019-09-02 LAB — COMPREHENSIVE METABOLIC PANEL
ALT: 11 U/L (ref 0–53)
AST: 13 U/L (ref 0–37)
Albumin: 4.3 g/dL (ref 3.5–5.2)
Alkaline Phosphatase: 75 U/L (ref 39–117)
BUN: 19 mg/dL (ref 6–23)
CO2: 24 mEq/L (ref 19–32)
Calcium: 9.9 mg/dL (ref 8.4–10.5)
Chloride: 95 mEq/L — ABNORMAL LOW (ref 96–112)
Creatinine, Ser: 1.81 mg/dL — ABNORMAL HIGH (ref 0.40–1.50)
GFR: 36.59 mL/min — ABNORMAL LOW (ref >60.00)
Glucose, Bld: 656 mg/dL (ref 70–99)
Potassium: 3.7 mEq/L (ref 3.5–5.1)
Sodium: 132 mEq/L — ABNORMAL LOW (ref 135–145)
Total Bilirubin: 1 mg/dL (ref 0.2–1.2)
Total Protein: 6.9 g/dL (ref 6.0–8.3)

## 2019-09-02 LAB — LIPID PANEL
Cholesterol: 194 mg/dL (ref 0–200)
HDL: 35.8 mg/dL — ABNORMAL LOW (ref >39.00)
NonHDL: 158.38
Total CHOL/HDL Ratio: 5
Triglycerides: 388 mg/dL — ABNORMAL HIGH (ref 0.0–149.0)
VLDL: 77.6 mg/dL — ABNORMAL HIGH (ref 0.0–40.0)

## 2019-09-02 LAB — LDL CHOLESTEROL, DIRECT: Direct LDL: 103 mg/dL

## 2019-09-02 LAB — MICROALBUMIN / CREATININE URINE RATIO
Creatinine,U: 80.1 mg/dL
Microalb Creat Ratio: 26.5 mg/g (ref 0.0–30.0)
Microalb, Ur: 21.2 mg/dL — ABNORMAL HIGH (ref 0.0–1.9)

## 2019-09-02 LAB — HEMOGLOBIN A1C: Hgb A1c MFr Bld: 13.2 % — ABNORMAL HIGH (ref 4.6–6.5)

## 2019-09-02 MED ORDER — BASAGLAR KWIKPEN 100 UNIT/ML ~~LOC~~ SOPN: 20.0000 [IU] | PEN_INJECTOR | Freq: Every day | SUBCUTANEOUS | 0 refills | Status: DC

## 2019-09-02 NOTE — Telephone Encounter (Signed)
Patient coming by today to pick up sample of Basaglar. Will start Basaglar 20 units daily. See CCM documentation  Patient also to bring proof of income. Will apply for medication assistance for Basaglar, Trulicity, and Jardiance. Scheduled follow up call in 2 weeks for insulin titration.

## 2019-09-02 NOTE — Telephone Encounter (Signed)
Please call the patient and let him know that his glucose was 656 on labs. He needs to be evaluated in the ED for this. Please advise him to go to the ED. Please see if he has any symptoms of confusion, nausea, vomiting, abdominal pain, or other symptoms. Thanks.

## 2019-09-02 NOTE — Chronic Care Management (AMB) (Signed)
Chronic Care Management   Follow Up Note   09/02/2019 Name: Shane Jones MRN: [id number] DOB: 05/29/1945  Referred by: Leone Haven, MD Reason for referral : Chronic Care Management (Medication Management)   Shane Jones is a 74 y.o. year old male who is a primary care patient of Caryl Bis, Angela Adam, MD. The CCM team was consulted for assistance with chronic disease management and care coordination needs.     Met with patient face to face for medication management.   Review of patient status, including review of consultants reports, relevant laboratory and other test results, and collaboration with appropriate care team members and the patient's provider was performed as part of comprehensive patient evaluation and provision of chronic care management services.    SDOH (Social Determinants of Health) assessments performed: Yes See Care Plan activities for detailed interventions related to SDOH)  SDOH Interventions     Most Recent Value  SDOH Interventions  Financial Strain Interventions Other (Comment)  [manufacturer assistance]       Outpatient Encounter Medications as of 09/02/2019  Medication Sig Note   Accu-Chek FastClix Lancets MISC USE UP TO FOUR TIMES DAILY AS DIRECTED    ACCU-CHEK GUIDE test strip USE UP TO FOUR TIMES DAILY AS DIRECTED    allopurinol (ZYLOPRIM) 100 MG tablet TAKE 1 TABLET DAILY    amLODipine (NORVASC) 10 MG tablet TAKE 1 TABLET EVERY DAY    aspirin 325 MG tablet Take 325 mg by mouth daily.    atorvastatin (LIPITOR) 40 MG tablet TAKE 1 TABLET EVERY DAY    B Complex Vitamins (VITAMIN-B COMPLEX PO) Take by mouth.    BETA CAROTENE PO Take by mouth.    blood glucose meter kit and supplies Dispense based on patient and insurance preference. Use up to four times daily as directed. (FOR ICD-10 E10.9, E11.9).    Calcium Carbonate-Vitamin D (CALCIUM + D PO) Take by mouth.    doxazosin (CARDURA) 4 MG tablet     Dulaglutide (TRULICITY) 1.5 TL/5.7WI  SOPN Inject into the skin.    empagliflozin (JARDIANCE) 25 MG TABS tablet Take 25 mg by mouth daily.    escitalopram (LEXAPRO) 20 MG tablet TAKE 1 TABLET (20 MG) BY MOUTH DAILY    furosemide (LASIX) 20 MG tablet Take one tablet (20 mg total) by mouth daily.    gabapentin (NEURONTIN) 400 MG capsule TAKE 1 CAPSULE THREE TIMES DAILY    glipiZIDE (GLUCOTROL) 5 MG tablet Take 1 tablet (5 mg total) by mouth 2 (two) times daily before a meal. 05/22/2019: Taking QAM    hydrALAZINE (APRESOLINE) 10 MG tablet Take 1 tablet (10 mg total) by mouth 2 (two) times daily as needed. If BP >130/>80 take 1 pill up to 2x per day    Insulin Glargine (BASAGLAR KWIKPEN) 100 UNIT/ML Inject 0.2 mLs (20 Units total) into the skin daily.    losartan (COZAAR) 100 MG tablet TAKE 1 TABLET EVERY DAY    magnesium 30 MG tablet Take 30 mg by mouth 2 (two) times daily.    metFORMIN (GLUCOPHAGE) 1000 MG tablet Take 1 tablet (1,000 mg total) by mouth 2 (two) times daily with a meal.    metoprolol succinate (TOPROL-XL) 100 MG 24 hr tablet TAKE 1 TABLET EVERY DAY    Multiple Vitamin (MULTIVITAMIN) tablet Take 1 tablet by mouth daily.    Na Sulfate-K Sulfate-Mg Sulf (SUPREP BOWEL PREP KIT) 17.5-3.13-1.6 GM/177ML SOLN Take 1 kit by mouth as directed.    pantoprazole (PROTONIX) 40  MG tablet Take 1 tablet (40 mg total) by mouth daily.    polyethylene glycol-electrolytes (NULYTELY) 420 g solution Drink one 8 oz glass every 20 mins until entire container is finished starting at 5:00pm on 06/16/19    potassium chloride (KLOR-CON) 10 MEQ tablet TAKE 1 TABLET BY MOUTH DAILY WITH LASIX    Zinc Sulfate (ZINC 15 PO) Take by mouth.    No facility-administered encounter medications on file as of 09/02/2019.     Objective:   Goals Addressed              This Visit's Progress     Patient Stated     "I want to keep my diabetes under control" (pt-stated)         CARE PLAN ENTRY (see longtitudinal plan of care for additional  care plan information)  Current Barriers:   Social, financial, community barriers:  o Need to reapply for patient assistance for Trulicity and Jardiance  Diabetes: uncontrolled; most recent A0O 45.9%, complicated by HTN, CKD, depression   Current antihyperglycemic regimen: metformin 1000 mg BID, glipizide 5 mg w/ big meal, Jardiance 25 mg QAM, Trulicity 1.5 mg x 2 weekly;   Reports symptoms of polyuria,   Cardiovascular risk reduction: o Current hypertensive regimen: losartan 100 mg QAM, metoprolol XL 100 mg QAM, amlodipine 10 mg daily QAM, doxazosin 4 mg QPM, hydralazine 10 mg PRN SBP >130; BP at goal in office today o Current hyperlipidemia regimen: atorvastatin 40 mg, LDL not at goal on last check, though previously was controlled. Suspect adherent concerns. o Current antiplatelet regimen: ASA 325 mg daily; reports no hx CVA  Depression: escitalopram 20 mg daily  GERD: pantoprazole 40 mg daily  Pharmacist Clinical Goal(s):   Over the next 90 days, patient with work with PharmD and primary care provider to address optimized medication management  Interventions:  Comprehensive medication review performed, medication list updated in electronic medical record  Inter-disciplinary care team collaboration (see longitudinal plan of care)  Reviewed glucose and A1c. Start Basaglar 20 units once daily. Samples provided today.   Received copy of proof of income. Submitting patient assistance today for Jardiance to [place] and Trulicity, Engineer, agricultural to [employer]. Will collaborate w/ CPhT for follow up.   Patient Self Care Activities:   Patient will check blood glucose BID, document, and provide at future appointments  Patient will focus on medication adherence by continuing to use his pill box   Patient will take medications as prescribed  Patient will report any questions or concerns to provider   Please see past updates related to this goal by clicking on the "Past Updates" button in  the selected goal          Plan:  - Scheduled f/u call in ~ 2 weeks  Catie Darnelle Maffucci, PharmD, [place], [place] [phone]   Medication Samples have been provided to the patient.  Drug name: Basaglar       Strength: U100       Qty: 2 pens  LOT: [id number]  Exp.Date: 09/2019

## 2019-09-02 NOTE — Telephone Encounter (Signed)
CRITICAL VALUE STICKER  CRITICAL VALUE: Glucose-656  RECEIVER (on-site recipient of call): Jari Favre, CMA/XT  DATE & TIME NOTIFIED: 8/31 @ 10:43am  MESSENGER (representative from lab): Elam  MD NOTIFIED: Caryl Bis  TIME OF NOTIFICATION: 10:45am  RESPONSE: see note or lab results

## 2019-09-02 NOTE — Telephone Encounter (Signed)
LVM for the patient to call back about critical lab results.  Kristel Durkee,cma

## 2019-09-02 NOTE — Telephone Encounter (Signed)
LVM for the patient to call back for critical lab instructions.  Shane Jones,cma

## 2019-09-02 NOTE — Telephone Encounter (Signed)
Catie is in the process of calling the patient to start him on insulin.

## 2019-09-02 NOTE — Telephone Encounter (Signed)
The patient called back and I informed him to go to the ER because of his critical BS value, patient refused stating her had to take care of his dog and he had things to do today, he missed my calls earlier because he just woke up. Patient took his BS while we were on the phone and it was 443.  I informed him if he has any symptoms to call and let us know.  Cigi Bega,cma

## 2019-09-02 NOTE — Patient Instructions (Addendum)
[name],   Your A1c was 13.2%.   Start Basaglar 20 units daily. You need to try to take this around the same time every day.   Continue Trulicity 3 mg weekly and Jardiance 25 mg daily.   I will submit the patient assistance applications today.   I scheduled a follow up call in 2 weeks (Friday, September 17) for [name] to review how your sugars are doing, and to discuss how to adjust your doses.    Catie Darnelle Maffucci, PharmD [phone]  Visit Information  Goals Addressed              This Visit's Progress     Patient Stated   .  "I want to keep my diabetes under control" (pt-stated)         CARE PLAN ENTRY (see longtitudinal plan of care for additional care plan information)  Current Barriers:  . Social, financial, community barriers:  o Need to reapply for patient assistance for [place] . Diabetes: uncontrolled; most recent K7Q 25.9%, complicated by HTN, CKD, depression  . Current antihyperglycemic regimen: metformin 1000 mg BID, glipizide 5 mg w/ big meal, Jardiance 25 mg QAM, Trulicity 1.5 mg x 2 weekly;  . Reports symptoms of polyuria,  . Cardiovascular risk reduction: o Current hypertensive regimen: losartan 100 mg QAM, metoprolol XL 100 mg QAM, amlodipine 10 mg daily QAM, doxazosin 4 mg QPM, hydralazine 10 mg PRN SBP >130; BP at goal in office today o Current hyperlipidemia regimen: atorvastatin 40 mg, LDL not at goal on last check, though previously was controlled. Suspect adherent concerns. o Current antiplatelet regimen: ASA 325 mg daily; reports no hx CVA . Depression: escitalopram 20 mg daily . GERD: pantoprazole 40 mg daily  Pharmacist Clinical Goal(s):  Marland Kitchen Over the next 90 days, patient with work with PharmD and primary care provider to address optimized medication management  Interventions: . Comprehensive medication review performed, medication list updated in electronic medical record . Inter-disciplinary care team collaboration (see longitudinal  plan of care) . Reviewed glucose and A1c. Start Basaglar 20 units once daily. Samples provided today.  . Received copy of proof of income. Submitting patient assistance today for Jardiance to [place] and Trulicity, Engineer, agricultural to [employer]. Will collaborate w/ CPhT for follow up.   Patient Self Care Activities:  . Patient will check blood glucose BID, document, and provide at future appointments . Patient will focus on medication adherence by continuing to use his pill box  . Patient will take medications as prescribed . Patient will report any questions or concerns to provider   Please see past updates related to this goal by clicking on the "Past Updates" button in the selected goal         Print copy of patient instructions provided.    Plan:  - Scheduled f/u call in ~ 2 weeks  Catie Darnelle Maffucci, PharmD, [place], [place] Pharmacist [place] [phone]

## 2019-09-04 ENCOUNTER — Ambulatory Visit (INDEPENDENT_AMBULATORY_CARE_PROVIDER_SITE_OTHER): Payer: Medicare Other | Admitting: Pharmacist

## 2019-09-04 DIAGNOSIS — IMO0002 Reserved for concepts with insufficient information to code with codable children: Secondary | ICD-10-CM

## 2019-09-04 DIAGNOSIS — F329 Major depressive disorder, single episode, unspecified: Secondary | ICD-10-CM | POA: Diagnosis not present

## 2019-09-04 DIAGNOSIS — E1140 Type 2 diabetes mellitus with diabetic neuropathy, unspecified: Secondary | ICD-10-CM | POA: Diagnosis not present

## 2019-09-04 DIAGNOSIS — E1165 Type 2 diabetes mellitus with hyperglycemia: Secondary | ICD-10-CM | POA: Diagnosis not present

## 2019-09-04 DIAGNOSIS — F419 Anxiety disorder, unspecified: Secondary | ICD-10-CM | POA: Diagnosis not present

## 2019-09-04 NOTE — Patient Instructions (Signed)
Visit Information  Goals Addressed              This Visit's Progress     Patient Stated   .  "I want to keep my diabetes under control" (pt-stated)         CARE PLAN ENTRY (see longtitudinal plan of care for additional care plan information)  Current Barriers:  . Social, financial, community barriers:  o Working on patient assistance reapplication . Diabetes: uncontrolled; most recent Y6H 68.3%, complicated by HTN, CKD, depression  . Current antihyperglycemic regimen: metformin 1000 mg BID, glipizide 5 mg w/ big meal, Jardiance 25 mg QAM, Trulicity 1.5 mg x 2 weekly; Basaglar 20 units daily  . Glucose readings: reports reading on 9/1 was 314, reading this morning was 498 . Cardiovascular risk reduction: o Current hypertensive regimen: losartan 100 mg QAM, metoprolol XL 100 mg QAM, amlodipine 10 mg daily QAM, doxazosin 4 mg QPM, hydralazine 10 mg PRN SBP >130 o Current hyperlipidemia regimen: atorvastatin 40 mg, LDL not at goal on last check, though previously was controlled. Suspect adherent concerns. o Current antiplatelet regimen: ASA 325 mg daily; reports no hx CVA . Depression: escitalopram 20 mg daily . GERD: pantoprazole 40 mg daily  Pharmacist Clinical Goal(s):  Marland Kitchen Over the next 90 days, patient with work with PharmD and primary care provider to address optimized medication management  Interventions: . Comprehensive medication review performed, medication list updated in electronic medical record . Inter-disciplinary care team collaboration (see longitudinal plan of care) . Patient was APPROVED for [employer] for [employer], [place] and BI for [employer].  . Reviewed glucose readings. Patient to increase Basaglar to 24 units on Saturday if fastings still >300. Patient wrote down these instructions and read back to me.  Patient Self Care Activities:  . Patient will check blood glucose BID, document, and provide at future appointments . Patient will focus on medication  adherence by continuing to use his pill box  . Patient will take medications as prescribed . Patient will report any questions or concerns to provider   Please see past updates related to this goal by clicking on the "Past Updates" button in the selected goal         The patient verbalized understanding of instructions provided today and declined a print copy of patient instruction materials.   Plan:  - Will follow up next week as previously scheduled  Catie Darnelle Maffucci, PharmD, [place], [place] Pharmacist [place] [phone]

## 2019-09-04 NOTE — Chronic Care Management (AMB) (Signed)
Chronic Care Management   Follow Up Note   09/04/2019 Name: MEZIAH BLASINGAME MRN: [id number] DOB: 1945/12/04  Referred by: Leone Haven, MD Reason for referral : Chronic Care Management (Medication Management)   DOC MANDALA is a 74 y.o. year old male who is a primary care patient of Caryl Bis, Angela Adam, MD. The CCM team was consulted for assistance with chronic disease management and care coordination needs.    Contacted patient for medication management.   Review of patient status, including review of consultants reports, relevant laboratory and other test results, and collaboration with appropriate care team members and the patient's provider was performed as part of comprehensive patient evaluation and provision of chronic care management services.    SDOH (Social Determinants of Health) assessments performed: No See Care Plan activities for detailed interventions related to [facility])     Outpatient Encounter Medications as of 09/04/2019  Medication Sig Note  . Accu-Chek FastClix Lancets MISC USE UP TO FOUR TIMES DAILY AS DIRECTED   . ACCU-CHEK GUIDE test strip USE UP TO FOUR TIMES DAILY AS DIRECTED   . allopurinol (ZYLOPRIM) 100 MG tablet TAKE 1 TABLET DAILY   . amLODipine (NORVASC) 10 MG tablet TAKE 1 TABLET EVERY DAY   . aspirin 325 MG tablet Take 325 mg by mouth daily.   Marland Kitchen atorvastatin (LIPITOR) 40 MG tablet TAKE 1 TABLET EVERY DAY   . B Complex Vitamins (VITAMIN-B COMPLEX PO) Take by mouth.   Marland Kitchen BETA CAROTENE PO Take by mouth.   . blood glucose meter kit and supplies Dispense based on patient and insurance preference. Use up to four times daily as directed. (FOR ICD-10 E10.9, E11.9).   . Calcium Carbonate-Vitamin D (CALCIUM + D PO) Take by mouth.   . doxazosin (CARDURA) 4 MG tablet    . Dulaglutide (TRULICITY) 1.5 KZ/6.0FU SOPN Inject into the skin.   Marland Kitchen empagliflozin (JARDIANCE) 25 MG TABS tablet Take 25 mg by mouth daily.   Marland Kitchen escitalopram (LEXAPRO) 20 MG tablet TAKE 1 TABLET  (20 MG) BY MOUTH DAILY   . furosemide (LASIX) 20 MG tablet Take one tablet (20 mg total) by mouth daily.   Marland Kitchen gabapentin (NEURONTIN) 400 MG capsule TAKE 1 CAPSULE THREE TIMES DAILY   . glipiZIDE (GLUCOTROL) 5 MG tablet Take 1 tablet (5 mg total) by mouth 2 (two) times daily before a meal. 05/22/2019: Taking QAM   . hydrALAZINE (APRESOLINE) 10 MG tablet Take 1 tablet (10 mg total) by mouth 2 (two) times daily as needed. If BP >130/>80 take 1 pill up to 2x per day   . Insulin Glargine (BASAGLAR KWIKPEN) 100 UNIT/ML Inject 0.2 mLs (20 Units total) into the skin daily.   Marland Kitchen losartan (COZAAR) 100 MG tablet TAKE 1 TABLET EVERY DAY   . magnesium 30 MG tablet Take 30 mg by mouth 2 (two) times daily.   . metFORMIN (GLUCOPHAGE) 1000 MG tablet Take 1 tablet (1,000 mg total) by mouth 2 (two) times daily with a meal.   . metoprolol succinate (TOPROL-XL) 100 MG 24 hr tablet TAKE 1 TABLET EVERY DAY   . Multiple Vitamin (MULTIVITAMIN) tablet Take 1 tablet by mouth daily.   . Na Sulfate-K Sulfate-Mg Sulf (SUPREP BOWEL PREP KIT) 17.5-3.13-1.6 GM/177ML SOLN Take 1 kit by mouth as directed.   . pantoprazole (PROTONIX) 40 MG tablet Take 1 tablet (40 mg total) by mouth daily.   . polyethylene glycol-electrolytes (NULYTELY) 420 g solution Drink one 8 oz glass every 20 mins until  entire container is finished starting at 5:00pm on 06/16/19   . potassium chloride (KLOR-CON) 10 MEQ tablet TAKE 1 TABLET BY MOUTH DAILY WITH LASIX   . Zinc Sulfate (ZINC 15 PO) Take by mouth.    No facility-administered encounter medications on file as of 09/04/2019.     Objective:   Goals Addressed              This Visit's Progress     Patient Stated   .  "I want to keep my diabetes under control" (pt-stated)         CARE PLAN ENTRY (see longtitudinal plan of care for additional care plan information)  Current Barriers:  . Social, financial, community barriers:  o Working on patient assistance reapplication . Diabetes:  uncontrolled; most recent I2M 35.5%, complicated by HTN, CKD, depression  . Current antihyperglycemic regimen: metformin 1000 mg BID, glipizide 5 mg w/ big meal, Jardiance 25 mg QAM, Trulicity 1.5 mg x 2 weekly; Basaglar 20 units daily  . Glucose readings: reports reading on 9/1 was 314, reading this morning was 498 . Cardiovascular risk reduction: o Current hypertensive regimen: losartan 100 mg QAM, metoprolol XL 100 mg QAM, amlodipine 10 mg daily QAM, doxazosin 4 mg QPM, hydralazine 10 mg PRN SBP >130 o Current hyperlipidemia regimen: atorvastatin 40 mg, LDL not at goal on last check, though previously was controlled. Suspect adherent concerns. o Current antiplatelet regimen: ASA 325 mg daily; reports no hx CVA . Depression: escitalopram 20 mg daily . GERD: pantoprazole 40 mg daily  Pharmacist Clinical Goal(s):  Marland Kitchen Over the next 90 days, patient with work with PharmD and primary care provider to address optimized medication management  Interventions: . Comprehensive medication review performed, medication list updated in electronic medical record . Inter-disciplinary care team collaboration (see longitudinal plan of care) . Patient was APPROVED for [employer] for [employer], [place] and BI for [employer].  . Reviewed glucose readings. Patient to increase Basaglar to 24 units on Saturday if fastings still >300. Patient wrote down these instructions and read back to me.  Patient Self Care Activities:  . Patient will check blood glucose BID, document, and provide at future appointments . Patient will focus on medication adherence by continuing to use his pill box  . Patient will take medications as prescribed . Patient will report any questions or concerns to provider   Please see past updates related to this goal by clicking on the "Past Updates" button in the selected goal          Plan:  - Will follow up next week as previously scheduled  Catie Darnelle Maffucci, PharmD, [place], [place]  Pharmacist [place] [phone]

## 2019-09-05 ENCOUNTER — Other Ambulatory Visit: Payer: Self-pay | Admitting: Pharmacy Technician

## 2019-09-05 NOTE — Patient Outreach (Signed)
[facility]) Care Management  09/05/2019  Shane Jones 1945-02-13 [id number]   ADDENDUM  Unsuccessful outreach call placed to patient in regards to [employer] for [employer] and Trulicity and BI application for [employer].  Unfortunately patient did not answer the phone, HIPAA compliant voicemail left.  Was calling to inform patient that Shane Jones would be outreaching him to setup his shipment and to provide him the phone number to [employer] to set up shipment if he has not heard from them by Tuesday 09/09/2019.  Will route note to embedded [facility] RPh Shane Jones as FYI.  Will follow up with patient in 5-10 business days to confirm all 3 medications have arrived.  Shane Jones P. [name], [name]  [phone]

## 2019-09-05 NOTE — Patient Outreach (Signed)
[facility]) Care Management  09/05/2019  MUKUND WEINREB May 30, 1945 [id number]  Care coordination call placed to 2 patient assistance companies, [place] in regards to [employer] and [employer] and BI in regards to [place] application.  Spoke to Matheny at [place] where she informed patient was APPROVED 09/03/2019-01/02/2020 for both Engineer, agricultural and Trulicity. She informed Ralph Leyden would be outreaching patient to set up delivery. If patient has not heard from [place] by Tuesday, then she would advise patient to call into [employer] at [phone], option 2 to set up his shipment.  Spoke to April at [facility] where she informed patient was APPROVED 09/04/2019-01/02/2020 for Jardiance. She informed patient could expect medication delivery to his home in the next 7-10 business days.  Will outreach patient with this information.  Kymia Simi P. [place] Studer, [place]  [phone]

## 2019-09-10 ENCOUNTER — Other Ambulatory Visit: Payer: Self-pay | Admitting: *Deleted

## 2019-09-10 ENCOUNTER — Encounter: Payer: Self-pay | Admitting: *Deleted

## 2019-09-10 NOTE — Patient Outreach (Signed)
Shane Jones [facility]) Care Management  09/10/2019  Shane Jones 1945/06/03 [id number]    CSW made a fourth and final attempt to try and contact patient today to perform the initial phone assessment, as well as assess and assist with social work needs and services.  However, patient was unavailable at the time of CSW's calls.  CSW has left HIPAA compliant messages on voicemail for patient, while awaiting a return call.  CSW will proceed with case closure, as required number of phone attempts were made and a Patient Unsuccessful Outreach Letter was mailed to patient's home, allowing patient more than a month to respond if he was interested in receiving social work services and resources through [place] with Scientist, clinical (histocompatibility and immunogenetics).  CSW will notify patient's Primary Care Physician, Dr. Tommi Jones of CSW's plans to close patient's case, as well as send a Physician Case Closure Letter.  CSW will also mail a Case Closure Letter to patient's home.  Last, CSW will route this note to Shane Jones, [facility] and individual placing the referral.  Shane Jones, BSW, MSW, [place]  Licensed Clinical Social Worker  [place]  Mailing [place]. [address] [place], [place] [address] Physical Address-[address], [place], [place] [address] Toll Free Main # [phone] Fax # [phone] Cell # [phone]  [email or web address]

## 2019-09-11 ENCOUNTER — Other Ambulatory Visit: Payer: Self-pay | Admitting: Pharmacy Technician

## 2019-09-11 NOTE — Patient Outreach (Signed)
[facility]) Care Management  09/11/2019  MILO SCHREIER 1945/05/25 [id number]   Incoming call received from patient, HIPAA identifiers verified.  Patient was returning my call in regards to [facility] application for [place] and Heritage manager for [employer].  Provided patient the phone number to [facility] for him to call and set up his shipment of medication. Patient informed he would call today.   Also informed patient to be expecting the [facility] delivery. Informed him this usually comes in the mail but they may leave in another area if mailbox is not big enough for the package. Patient verbalized understanding.  Will follow up with patient in 5-7 business days to inquire if all 3 medications were received.  Donetta Isaza P. Geary Rufo, [place]  [phone]

## 2019-09-14 ENCOUNTER — Other Ambulatory Visit: Payer: Self-pay | Admitting: Family Medicine

## 2019-09-14 DIAGNOSIS — I10 Essential (primary) hypertension: Secondary | ICD-10-CM

## 2019-09-18 ENCOUNTER — Other Ambulatory Visit: Payer: Self-pay | Admitting: Pharmacy Technician

## 2019-09-18 NOTE — Patient Outreach (Signed)
Shane Jones [facility]) Care Management  09/18/2019  Shane Jones [birth year] [id number]   Incoming call received from patient in regards to voicemail left for him concerning his Heritage manager for [employer] and Trulicity and his Surveyor, mining for [employer].  Spoke to patient, HIPAA identifiers verified.  Patient informed he was able to [employer] and one of the medications was being delivered tomorrow and the other one was being delivered next week. He also informed he has not yet received the Jardiance.  Will follow up with patient in 7-10 business days to confirm all 3 medications were received and to discuss the refill process for both programs.  Leimomi Zervas P. Haide Klinker, [place]  [phone]

## 2019-09-18 NOTE — Patient Outreach (Signed)
[facility]) Care Management  09/18/2019  Shane Jones 1945-05-25 [id number]  ADDENDUM  Care coordination call placed to BI in regards to Jardiance application.  Spoke to [facility] who informed the medication has not shipped due to the dob on the prescription did not match the dob on the application. Shane Jones informed the provider can call their pharmacy at [phone] to give a verbal which is the fasted way or they can correct it and refax it into them.   Will outreach embedded [facility] RPh Shane Jones to inquire if she can call in a verbal order.  Shane Jones, [place]  [phone]

## 2019-09-18 NOTE — Patient Outreach (Signed)
[facility]) Care Management  09/18/2019  Shane Jones Sep 11, 1945 [id number]   Unsuccessful call placed to patient regarding patient assistance medication delivery of Jardiance from [place] and [place] from [place], HIPAA compliant voicemail left.   Was calling to inquire if he has received the above named medications.   Follow up:  Will follow up with patient in 2-3 business days if call is not returned.  [name] P. Demauri Advincula, [place]  [phone]

## 2019-09-19 ENCOUNTER — Ambulatory Visit: Payer: Medicare Other | Admitting: Pharmacist

## 2019-09-19 ENCOUNTER — Other Ambulatory Visit: Payer: Self-pay

## 2019-09-19 DIAGNOSIS — E1140 Type 2 diabetes mellitus with diabetic neuropathy, unspecified: Secondary | ICD-10-CM | POA: Diagnosis not present

## 2019-09-19 DIAGNOSIS — IMO0002 Reserved for concepts with insufficient information to code with codable children: Secondary | ICD-10-CM

## 2019-09-19 DIAGNOSIS — F419 Anxiety disorder, unspecified: Secondary | ICD-10-CM

## 2019-09-19 DIAGNOSIS — E1165 Type 2 diabetes mellitus with hyperglycemia: Secondary | ICD-10-CM

## 2019-09-19 DIAGNOSIS — F329 Major depressive disorder, single episode, unspecified: Secondary | ICD-10-CM

## 2019-09-19 DIAGNOSIS — F32A Depression, unspecified: Secondary | ICD-10-CM

## 2019-09-19 MED ORDER — BASAGLAR KWIKPEN 100 UNIT/ML ~~LOC~~ SOPN: 24.0000 [IU] | PEN_INJECTOR | Freq: Every day | SUBCUTANEOUS | 0 refills | Status: DC

## 2019-09-19 NOTE — Patient Outreach (Signed)
[place] [facility]) Care Management  09/19/2019  Shane Jones 1945-11-02 [id number]   Case closed; Attempted to reach patient for 4th time. Phone goes straight to voicemail.  PLAN: will close case as not being able to reach patient. Not that [facility] social worker unable to reach as well. Patient is active with embedded team at MD office.  PLAN: case closure  Tomasa Rand, RN, BSN, CEN [facility] [employer] [phone]

## 2019-09-19 NOTE — Patient Instructions (Signed)
Visit Information  Goals Addressed              This Visit's Progress     Patient Stated   .  "I want to keep my diabetes under control" (pt-stated)         CARE PLAN ENTRY (see longtitudinal plan of care for additional care plan information)  Current Barriers:  . Social, financial, community barriers:  o None at this time. Waiting for delivery of patient assistance medications from [place] (Trulicity today, Engineer, agricultural next week) and Jardiance (shipping time unknown) . Diabetes: uncontrolled; most recent Z3A 07.6%, complicated by HTN, CKD, depression  . Current antihyperglycemic regimen: metformin 1000 mg BID, glipizide 5 mg w/ big meal, Jardiance 25 mg QAM, Trulicity 1.5 mg x 2 weekly; Basaglar 24 units daily prescribed, but recently backed down to 20 units daily because he was afraid he was going to run out before his PAP supply arrived . Denies any improvement in symptoms of hyperglycemia, but notes that he didn't feel "bad" and denied vision changes, nocturia, polyuria when sugars were higher.  . Current glucose readings: o Patient notes he has not remembered to check before breakfast; reports he has seen readings ranging from 160-220s . Cardiovascular risk reduction: o Current hypertensive regimen: losartan 100 mg QAM, metoprolol XL 100 mg QAM, amlodipine 10 mg daily QAM, doxazosin 4 mg QPM, hydralazine 10 mg PRN SBP >130 o Current hyperlipidemia regimen: atorvastatin 40 mg, LDL not at goal on last check  o Current antiplatelet regimen: ASA 325 mg daily; reports no hx CVA . Depression: escitalopram 20 mg daily; mood seems to be better today. Patient more energetic . GERD: pantoprazole 40 mg daily  Pharmacist Clinical Goal(s):  Marland Kitchen Over the next 90 days, patient with work with PharmD and primary care provider to address optimized medication management  Interventions: . Comprehensive medication review performed, medication list updated in electronic medical record . Inter-disciplinary  care team collaboration (see longitudinal plan of care) . Reviewed goal glucose readings. Increase Basaglar to 24 units daily. Will prepare another sample pen of Basaglar to prevent patient from running out of supply before PAP supply arrives next week. Patient appreciative. Reviewed that Trulicity 3 mg pen will be arriving (has previously been doing 2 1.5 mg pens weekly). Patient verbalized understanding  Patient Self Care Activities:  . Patient will check blood glucose BID, document, and provide at future appointments . Patient will focus on medication adherence by continuing to use his pill box  . Patient will take medications as prescribed . Patient will report any questions or concerns to provider   Please see past updates related to this goal by clicking on the "Past Updates" button in the selected goal         The patient verbalized understanding of instructions provided today and declined a print copy of patient instruction materials.   Plan:  - Will outreach in ~ 3 weeks as previously scheduled  Catie Darnelle Maffucci, PharmD, [place], [place] Pharmacist [facility] [phone]

## 2019-09-19 NOTE — Chronic Care Management (AMB) (Signed)
Chronic Care Management   Follow Up Note   09/19/2019 Name: BRYSTON COLOCHO MRN: [id number] DOB: 1945/04/09  Referred by: Leone Haven, MD Reason for referral : Chronic Care Management (Medication Management)   RIYANSH GERSTNER is a 74 y.o. year old male who is a primary care patient of Caryl Bis, Angela Adam, MD. The CCM team was consulted for assistance with chronic disease management and care coordination needs.    Contacted patient for medication management review.  Review of patient status, including review of consultants reports, relevant laboratory and other test results, and collaboration with appropriate care team members and the patient's provider was performed as part of comprehensive patient evaluation and provision of chronic care management services.    SDOH (Social Determinants of Health) assessments performed: Yes See Care Plan activities for detailed interventions related to SDOH)  SDOH Interventions     Most Recent Value  SDOH Interventions  Financial Strain Interventions Other (Comment)  [manufacturer assistance]       Outpatient Encounter Medications as of 09/19/2019  Medication Sig Note  . Accu-Chek FastClix Lancets MISC USE UP TO FOUR TIMES DAILY AS DIRECTED   . ACCU-CHEK GUIDE test strip USE UP TO FOUR TIMES DAILY AS DIRECTED   . allopurinol (ZYLOPRIM) 100 MG tablet TAKE 1 TABLET DAILY   . amLODipine (NORVASC) 10 MG tablet TAKE 1 TABLET EVERY DAY   . aspirin 325 MG tablet Take 325 mg by mouth daily.   Marland Kitchen atorvastatin (LIPITOR) 40 MG tablet TAKE 1 TABLET EVERY DAY   . B Complex Vitamins (VITAMIN-B COMPLEX PO) Take by mouth.   Marland Kitchen BETA CAROTENE PO Take by mouth.   . blood glucose meter kit and supplies Dispense based on patient and insurance preference. Use up to four times daily as directed. (FOR ICD-10 E10.9, E11.9).   . Calcium Carbonate-Vitamin D (CALCIUM + D PO) Take by mouth.   . doxazosin (CARDURA) 4 MG tablet    . Dulaglutide (TRULICITY) 3 MI/6.8EH SOPN  Inject 3 mg into the skin once a week.   . empagliflozin (JARDIANCE) 25 MG TABS tablet Take 25 mg by mouth daily.   Marland Kitchen escitalopram (LEXAPRO) 20 MG tablet TAKE 1 TABLET (20 MG) BY MOUTH DAILY   . furosemide (LASIX) 20 MG tablet Take one tablet (20 mg total) by mouth daily.   Marland Kitchen gabapentin (NEURONTIN) 400 MG capsule TAKE 1 CAPSULE THREE TIMES DAILY   . glipiZIDE (GLUCOTROL) 5 MG tablet Take 1 tablet (5 mg total) by mouth 2 (two) times daily before a meal. 05/22/2019: Taking QAM   . hydrALAZINE (APRESOLINE) 10 MG tablet TAKE 1 TABLET BY MOUTH UP TO TWICE DAILY AS NEEDED IF BLOOD PRESSURE> 130/80   . Insulin Glargine (BASAGLAR KWIKPEN) 100 UNIT/ML Inject 0.2 mLs (20 Units total) into the skin daily.   Marland Kitchen losartan (COZAAR) 100 MG tablet TAKE 1 TABLET EVERY DAY   . magnesium 30 MG tablet Take 30 mg by mouth 2 (two) times daily.   . metFORMIN (GLUCOPHAGE) 1000 MG tablet Take 1 tablet (1,000 mg total) by mouth 2 (two) times daily with a meal.   . metoprolol succinate (TOPROL-XL) 100 MG 24 hr tablet TAKE 1 TABLET EVERY DAY   . Multiple Vitamin (MULTIVITAMIN) tablet Take 1 tablet by mouth daily.   . pantoprazole (PROTONIX) 40 MG tablet Take 1 tablet (40 mg total) by mouth daily.   . potassium chloride (KLOR-CON) 10 MEQ tablet TAKE 1 TABLET BY MOUTH DAILY WITH LASIX   .  Zinc Sulfate (ZINC 15 PO) Take by mouth.   . [DISCONTINUED] Dulaglutide (TRULICITY) 1.5 TE/7.0RA SOPN Inject 1.5 mg into the skin once a week.    . [DISCONTINUED] Na Sulfate-K Sulfate-Mg Sulf (SUPREP BOWEL PREP KIT) 17.5-3.13-1.6 GM/177ML SOLN Take 1 kit by mouth as directed.   . [DISCONTINUED] polyethylene glycol-electrolytes (NULYTELY) 420 g solution Drink one 8 oz glass every 20 mins until entire container is finished starting at 5:00pm on 06/16/19    No facility-administered encounter medications on file as of 09/19/2019.     Objective:   Goals Addressed              This Visit's Progress     Patient Stated   .  "I want to keep  my diabetes under control" (pt-stated)         CARE PLAN ENTRY (see longtitudinal plan of care for additional care plan information)  Current Barriers:  . Social, financial, community barriers:  o None at this time. Waiting for delivery of patient assistance medications from [employer] (Tr[employer]icity today, [employer] next week) and Jardiance (shipping time unknown) . Diabetes: uncontrolled; most recent J5H 83.4%, complicated by HTN, CKD, depression  . Current antihyperglycemic regimen: metformin 1000 mg BID, glipizide 5 mg w/ big meal, Jardiance 25 mg QAM, Trulicity 1.5 mg x 2 weekly; Basaglar 24 units daily prescribed, but recently backed down to 20 units daily because he was afraid he was going to run out before his PAP supply arrived . Denies any improvement in symptoms of hyperglycemia, but notes that he didn't feel "bad" and denied vision changes, nocturia, polyuria when sugars were higher.  . Current glucose readings: o Patient notes he has not remembered to check before breakfast; reports he has seen readings ranging from 160-220s . Cardiovascular risk reduction: o Current hypertensive regimen: losartan 100 mg QAM, metoprolol XL 100 mg QAM, amlodipine 10 mg daily QAM, doxazosin 4 mg QPM, hydralazine 10 mg PRN SBP >130 o Current hyperlipidemia regimen: atorvastatin 40 mg, LDL not at goal on last check  o Current antiplatelet regimen: ASA 325 mg daily; reports no hx CVA . Depression: escitalopram 20 mg daily; mood seems to be better today. Patient more energetic . GERD: pantoprazole 40 mg daily  Pharmacist Clinical Goal(s):  Marland Kitchen Over the next 90 days, patient with work with PharmD and primary care provider to address optimized medication management  Interventions: . Comprehensive medication review performed, medication list updated in electronic medical record . Inter-disciplinary care team collaboration (see longitudinal plan of care) . Reviewed goal glucose readings. Increase Basaglar to 24 units  daily. Will prepare another sample pen of Basaglar to prevent patient from running out of supply before PAP supply arrives next week. Patient appreciative. Reviewed that Trulicity 3 mg pen will be arriving (has previously been doing 2 1.5 mg pens weekly). Patient verbalized understanding  Patient Self Care Activities:  . Patient will check blood glucose BID, document, and provide at future appointments . Patient will focus on medication adherence by continuing to use his pill box  . Patient will take medications as prescribed . Patient will report any questions or concerns to provider   Please see past updates related to this goal by clicking on the "Past Updates" button in the selected goal          Plan:  - Will outreach in ~ 3 weeks as previously scheduled  Catie Darnelle Maffucci, PharmD, [place], [place] Pharmacist [facility] [phone]

## 2019-09-25 ENCOUNTER — Ambulatory Visit (INDEPENDENT_AMBULATORY_CARE_PROVIDER_SITE_OTHER): Payer: Medicare Other

## 2019-09-25 ENCOUNTER — Other Ambulatory Visit: Payer: Self-pay

## 2019-09-25 ENCOUNTER — Telehealth: Payer: Self-pay | Admitting: Pharmacist

## 2019-09-25 DIAGNOSIS — Z23 Encounter for immunization: Secondary | ICD-10-CM | POA: Diagnosis not present

## 2019-09-25 NOTE — Telephone Encounter (Signed)
Medication Samples have been provided to the patient.  Drug name: Basaglar       Strength: U100        Qty: 1 pen (3 mL)  LOT: [id number]  Exp.Date: 06/2020

## 2019-09-30 ENCOUNTER — Other Ambulatory Visit: Payer: Self-pay | Admitting: Pharmacy Technician

## 2019-09-30 NOTE — Patient Outreach (Signed)
[facility]) Care Management  09/30/2019  Shane Jones 08-16-1945 [id number]  Successful call placed to patient regarding patient assistance medication delivery of Jardiance from [place], [place] and Trulicity from [employer] verified.   Patient informs he received 3-4 months supply of the [place], [employer]. Discussed refill procedure with patient. Advised patient to call [employer] around the first of December to re order the Jardiance and to call [employer] around the first of December to re order the [employer] and Trulicity. Informed patient where to find the phone numbers for the 2 companies on the pharmacy label on the medication bottle and/or box. Patient verbalized understanding. Confirmed patient had name and number if he had any other questions concerning medication assistance.  Follow up:  Will route note to embedded No Name for case closure as patient assistance has been completed.  Lynsey Ange P. Tasmine Hipwell, [place]  [phone]

## 2019-10-01 ENCOUNTER — Telehealth: Payer: Medicare Other

## 2019-10-08 ENCOUNTER — Ambulatory Visit (INDEPENDENT_AMBULATORY_CARE_PROVIDER_SITE_OTHER): Payer: Medicare Other | Admitting: Pharmacist

## 2019-10-08 DIAGNOSIS — F419 Anxiety disorder, unspecified: Secondary | ICD-10-CM | POA: Diagnosis not present

## 2019-10-08 DIAGNOSIS — IMO0002 Reserved for concepts with insufficient information to code with codable children: Secondary | ICD-10-CM

## 2019-10-08 DIAGNOSIS — E1165 Type 2 diabetes mellitus with hyperglycemia: Secondary | ICD-10-CM

## 2019-10-08 DIAGNOSIS — E1140 Type 2 diabetes mellitus with diabetic neuropathy, unspecified: Secondary | ICD-10-CM | POA: Diagnosis not present

## 2019-10-08 DIAGNOSIS — E785 Hyperlipidemia, unspecified: Secondary | ICD-10-CM

## 2019-10-08 DIAGNOSIS — F32A Depression, unspecified: Secondary | ICD-10-CM | POA: Diagnosis not present

## 2019-10-08 DIAGNOSIS — I10 Essential (primary) hypertension: Secondary | ICD-10-CM

## 2019-10-08 MED ORDER — BASAGLAR KWIKPEN 100 UNIT/ML ~~LOC~~ SOPN: 28.0000 [IU] | PEN_INJECTOR | Freq: Every day | SUBCUTANEOUS | 0 refills | Status: DC

## 2019-10-08 NOTE — Patient Instructions (Signed)
Shane Jones,   I am so glad you are doing well! Increase Basaglar to 28 units daily.  Here is how you should be filling your pill box:   Morning: Allopurinol (gout) Amlodipine (blood pressure) Metoprolol (blood pressure) Aspirin (stroke prevention) Jardiance (diabetes) Metformin (diabetes) Furosemide (fluid pill) Potassium Atorvastatin (cholesterol) Escitalopram (mood) Glipizide (diabetes)  Evening: Metformin (diabetes) Gabapentin (pain) Pantoprazole (acid reflux) Losartan (blood pressure) Doxazosin (blood pressure)  Please call me if there are any differences between what you are doing and the above list.   Catie Darnelle Jones, PharmD [phone]  Visit Information  Goals Addressed              This Visit's Progress     Patient Stated   .  "I want to keep my diabetes under control" (pt-stated)         CARE PLAN ENTRY (see longtitudinal plan of care for additional care plan information)  Current Barriers:  . Social, financial, community barriers:  o Reports that he wants to buy a motorcycle.  . Diabetes: uncontrolled though improved per SMBG results; most recent O1Y 07.3%, complicated by HTN, CKD, depression  . Current antihyperglycemic regimen: metformin 1000 mg BID, glipizide 5 mg w/ big meal, Jardiance 25 mg QAM, Trulicity 3 mg weekly, Basaglar 25 units daily  o APPROVED for Shane Jones, and Basaglar assistance through 01/02/20 . Denies any episodes of hypoglycemia . Current glucose readings: o Fasting: ranging 120-170s . Current meal patterns:  o ~3 pm- takes medications, checks sugar, eats a meal o Early supper - eating today at [employer] - goes and gets chicken w/ gravy, green beans, turnip greens; will drink diluted sweet tea . Cardiovascular risk reduction: o Current hypertensive regimen: furosemide 20 mg QAM, losartan 100 mg QAM, metoprolol XL 100 mg QAM, amlodipine 10 mg daily QAM, doxazosin 4 mg QPM; SBP 150s per his home report; not taking  the PRN hydralazine  o Current hyperlipidemia regimen: atorvastatin 40 mg, LDL not at goal on last check  o Current antiplatelet regimen: ASA 325 mg daily; reports no hx CVA . Depression: escitalopram 20 mg daily . GERD: pantoprazole 40 mg daily  Pharmacist Clinical Goal(s):  Marland Kitchen Over the next 90 days, patient with work with PharmD and primary care provider to address optimized medication management  Interventions: . Comprehensive medication review performed, medication list updated in electronic medical record . Inter-disciplinary care team collaboration (see longitudinal plan of care) . Reviewed refill hx. Patient up to date on refills.  . Reviewed glucose readings. Increase Basaglar to 28 units daily. Continue metformin 1000 mg BID, glipizide 5 mg w/ supper, Trulicity 3 mg weekly, and Jardiance 25 mg daily.  . Continue losartan, metoprolol, doxaxosin, furosemide, and amlodipine. Patient is to review his pill box and see if he is taking PRN hydralazine. Would prefer to switch metoprolol to carvedilol rather than add scheduled hydralazine if needed moving forward.  . Will mail patient a list of how he should be filling his pill box.  Patient Self Care Activities:  . Patient will check blood glucose BID, document, and provide at future appointments . Patient will focus on medication adherence by continuing to use his pill box  . Patient will take medications as prescribed . Patient will report any questions or concerns to provider   Please see past updates related to this goal by clicking on the "Past Updates" button in the selected goal         The patient verbalized understanding of instructions  provided today and agreed to receive a mailed copy of patient instruction and/or educational materials.  Plan:  - Scheduled f/u call in ~4 weeks  Catie Darnelle Jones, PharmD, [place], [place] Pharmacist [place] [phone]

## 2019-10-08 NOTE — Chronic Care Management (AMB) (Signed)
Chronic Care Management   Follow Up Note   10/08/2019 Name: Shane Jones MRN: [id number] DOB: 07/16/1945  Referred by: Shane Haven, MD Reason for referral : Chronic Care Management (Medication Management)   Shane Jones is a 74 y.o. year old male who is a primary care patient of Shane Jones, Shane Adam, MD. The CCM team was consulted for assistance with chronic disease management and care coordination needs.    Contacted patient for medication management review.   Review of patient status, including review of consultants reports, relevant laboratory and other test results, and collaboration with appropriate care team members and the patient's provider was performed as part of comprehensive patient evaluation and provision of chronic care management services.    SDOH (Social Determinants of Health) assessments performed: Yes See Care Plan activities for detailed interventions related to SDOH)  SDOH Interventions     Most Recent Value  SDOH Interventions  Financial Strain Interventions Other (Comment)  [manufacturer assistance]       Outpatient Encounter Medications as of 10/08/2019  Medication Sig Note  . Accu-Chek FastClix Lancets MISC USE UP TO FOUR TIMES DAILY AS DIRECTED   . ACCU-CHEK GUIDE test strip USE UP TO FOUR TIMES DAILY AS DIRECTED   . blood glucose meter kit and supplies Dispense based on patient and insurance preference. Use up to four times daily as directed. (FOR ICD-10 E10.9, E11.9).   . Dulaglutide (TRULICITY) 3 IP/3.8SN SOPN Inject 3 mg into the skin once a week.   . empagliflozin (JARDIANCE) 25 MG TABS tablet Take 25 mg by mouth daily.   Marland Kitchen glipiZIDE (GLUCOTROL) 5 MG tablet Take 1 tablet (5 mg total) by mouth 2 (two) times daily before a meal. 05/22/2019: Taking QAM   . Insulin Glargine (BASAGLAR KWIKPEN) 100 UNIT/ML Inject 24 Units into the skin daily.   . metFORMIN (GLUCOPHAGE) 1000 MG tablet Take 1 tablet (1,000 mg total) by mouth 2 (two) times daily with a  meal.   . allopurinol (ZYLOPRIM) 100 MG tablet TAKE 1 TABLET DAILY   . amLODipine (NORVASC) 10 MG tablet TAKE 1 TABLET EVERY DAY   . aspirin 325 MG tablet Take 325 mg by mouth daily.   Marland Kitchen atorvastatin (LIPITOR) 40 MG tablet TAKE 1 TABLET EVERY DAY   . B Complex Vitamins (VITAMIN-B COMPLEX PO) Take by mouth.   Marland Kitchen BETA CAROTENE PO Take by mouth.   . Calcium Carbonate-Vitamin D (CALCIUM + D PO) Take by mouth.   . doxazosin (CARDURA) 4 MG tablet    . escitalopram (LEXAPRO) 20 MG tablet TAKE 1 TABLET (20 MG) BY MOUTH DAILY   . furosemide (LASIX) 20 MG tablet Take one tablet (20 mg total) by mouth daily.   Marland Kitchen gabapentin (NEURONTIN) 400 MG capsule TAKE 1 CAPSULE THREE TIMES DAILY   . hydrALAZINE (APRESOLINE) 10 MG tablet TAKE 1 TABLET BY MOUTH UP TO TWICE DAILY AS NEEDED IF BLOOD PRESSURE> 130/80   . losartan (COZAAR) 100 MG tablet TAKE 1 TABLET EVERY DAY   . magnesium 30 MG tablet Take 30 mg by mouth 2 (two) times daily.   . metoprolol succinate (TOPROL-XL) 100 MG 24 hr tablet TAKE 1 TABLET EVERY DAY   . Multiple Vitamin (MULTIVITAMIN) tablet Take 1 tablet by mouth daily.   . pantoprazole (PROTONIX) 40 MG tablet Take 1 tablet (40 mg total) by mouth daily.   . potassium chloride (KLOR-CON) 10 MEQ tablet TAKE 1 TABLET BY MOUTH DAILY WITH LASIX   . Zinc Sulfate (  ZINC 15 PO) Take by mouth.    No facility-administered encounter medications on file as of 10/08/2019.     Objective:   Goals Addressed              This Visit's Progress     Patient Stated   .  "I want to keep my diabetes under control" (pt-stated)         CARE PLAN ENTRY (see longtitudinal plan of care for additional care plan information)  Current Barriers:  . Social, financial, community barriers:  o Reports that he wants to buy a motorcycle.  . Diabetes: uncontrolled though improved per SMBG results; most recent G9F 62.1%, complicated by HTN, CKD, depression  . Current antihyperglycemic regimen: metformin 1000 mg BID,  glipizide 5 mg w/ big meal, Jardiance 25 mg QAM, Trulicity 3 mg weekly, Basaglar 25 units daily  o APPROVED for [name], and Basaglar assistance through 01/02/20 . Denies any episodes of hypoglycemia . Current glucose readings: o Fasting: ranging 120-170s . Current meal patterns:  o ~3 pm- takes medications, checks sugar, eats a meal o Early supper - eating today at [employer] - goes and gets chicken w/ gravy, green beans, turnip greens; will drink diluted sweet tea . Cardiovascular risk reduction: o Current hypertensive regimen: furosemide 20 mg QAM, losartan 100 mg QAM, metoprolol XL 100 mg QAM, amlodipine 10 mg daily QAM, doxazosin 4 mg QPM; SBP 150s per his home report; not taking the PRN hydralazine  o Current hyperlipidemia regimen: atorvastatin 40 mg, LDL not at goal on last check  o Current antiplatelet regimen: ASA 325 mg daily; reports no hx CVA . Depression: escitalopram 20 mg daily . GERD: pantoprazole 40 mg daily  Pharmacist Clinical Goal(s):  Marland Kitchen Over the next 90 days, patient with work with PharmD and primary care provider to address optimized medication management  Interventions: . Comprehensive medication review performed, medication list updated in electronic medical record . Inter-disciplinary care team collaboration (see longitudinal plan of care) . Reviewed refill hx. Patient up to date on refills.  . Reviewed glucose readings. Increase Basaglar to 28 units daily. Continue metformin 1000 mg BID, glipizide 5 mg w/ supper, Trulicity 3 mg weekly, and Jardiance 25 mg daily.  . Continue losartan, metoprolol, doxaxosin, furosemide, and amlodipine. Patient is to review his pill box and see if he is taking PRN hydralazine. Would prefer to switch metoprolol to carvedilol rather than add scheduled hydralazine if needed moving forward.  . Will mail patient a list of how he should be filling his pill box.  Patient Self Care Activities:  . Patient will check blood  glucose BID, document, and provide at future appointments . Patient will focus on medication adherence by continuing to use his pill box  . Patient will take medications as prescribed . Patient will report any questions or concerns to provider   Please see past updates related to this goal by clicking on the "Past Updates" button in the selected goal          Plan:  - Scheduled f/u call in ~4 weeks  Catie Darnelle Maffucci, PharmD, [place], [place] Pharmacist [place] [facility] [phone]

## 2019-11-06 ENCOUNTER — Ambulatory Visit: Payer: Medicare Other | Admitting: Pharmacist

## 2019-11-06 DIAGNOSIS — IMO0002 Reserved for concepts with insufficient information to code with codable children: Secondary | ICD-10-CM

## 2019-11-06 DIAGNOSIS — E785 Hyperlipidemia, unspecified: Secondary | ICD-10-CM

## 2019-11-06 DIAGNOSIS — I10 Essential (primary) hypertension: Secondary | ICD-10-CM

## 2019-11-06 DIAGNOSIS — E1140 Type 2 diabetes mellitus with diabetic neuropathy, unspecified: Secondary | ICD-10-CM

## 2019-11-06 DIAGNOSIS — F32A Depression, unspecified: Secondary | ICD-10-CM

## 2019-11-06 NOTE — Chronic Care Management (AMB) (Addendum)
Chronic Care Management   Follow Up Note   11/06/2019 Name: Shane Jones MRN: [id number] DOB: 11-May-1945  Referred by: Leone Haven, MD Reason for referral : Chronic Care Management (Medication Management)   Shane Jones is a 74 y.o. year old male who is a primary care patient of Caryl Bis, Angela Adam, MD. The CCM team was consulted for assistance with chronic disease management and care coordination needs.    Contacted patient for medication management review.  Review of patient status, including review of consultants reports, relevant laboratory and other test results, and collaboration with appropriate care team members and the patient's provider was performed as part of comprehensive patient evaluation and provision of chronic care management services.    SDOH (Social Determinants of Health) assessments performed: Yes See Care Plan activities for detailed interventions related to SDOH)  SDOH Interventions     Most Recent Value  SDOH Interventions  Financial Strain Interventions Other (Comment)  [manufacturer assistance]       Outpatient Encounter Medications as of 11/06/2019  Medication Sig Note  . Dulaglutide (TRULICITY) 3 PP/2.9JJ SOPN Inject 3 mg into the skin once a week.   . empagliflozin (JARDIANCE) 25 MG TABS tablet Take 25 mg by mouth daily.   Marland Kitchen glipiZIDE (GLUCOTROL) 5 MG tablet Take 1 tablet (5 mg total) by mouth 2 (two) times daily before a meal. 05/22/2019: Taking QAM   . Insulin Glargine (BASAGLAR KWIKPEN) 100 UNIT/ML Inject 28 Units into the skin daily. 11/06/2019: 30 units  . metFORMIN (GLUCOPHAGE) 1000 MG tablet Take 1 tablet (1,000 mg total) by mouth 2 (two) times daily with a meal.   . Accu-Chek FastClix Lancets MISC USE UP TO FOUR TIMES DAILY AS DIRECTED   . ACCU-CHEK GUIDE test strip USE UP TO FOUR TIMES DAILY AS DIRECTED   . allopurinol (ZYLOPRIM) 100 MG tablet TAKE 1 TABLET DAILY   . amLODipine (NORVASC) 10 MG tablet TAKE 1 TABLET EVERY DAY   . aspirin  325 MG tablet Take 325 mg by mouth daily.   Marland Kitchen atorvastatin (LIPITOR) 40 MG tablet TAKE 1 TABLET EVERY DAY   . B Complex Vitamins (VITAMIN-B COMPLEX PO) Take by mouth.   Marland Kitchen BETA CAROTENE PO Take by mouth.   . blood glucose meter kit and supplies Dispense based on patient and insurance preference. Use up to four times daily as directed. (FOR ICD-10 E10.9, E11.9).   . Calcium Carbonate-Vitamin D (CALCIUM + D PO) Take by mouth.   . doxazosin (CARDURA) 4 MG tablet    . escitalopram (LEXAPRO) 20 MG tablet TAKE 1 TABLET (20 MG) BY MOUTH DAILY   . furosemide (LASIX) 20 MG tablet Take one tablet (20 mg total) by mouth daily.   Marland Kitchen gabapentin (NEURONTIN) 400 MG capsule TAKE 1 CAPSULE THREE TIMES DAILY   . hydrALAZINE (APRESOLINE) 10 MG tablet TAKE 1 TABLET BY MOUTH UP TO TWICE DAILY AS NEEDED IF BLOOD PRESSURE> 130/80   . losartan (COZAAR) 100 MG tablet TAKE 1 TABLET EVERY DAY   . magnesium 30 MG tablet Take 30 mg by mouth 2 (two) times daily.   . metoprolol succinate (TOPROL-XL) 100 MG 24 hr tablet TAKE 1 TABLET EVERY DAY   . Multiple Vitamin (MULTIVITAMIN) tablet Take 1 tablet by mouth daily.   . pantoprazole (PROTONIX) 40 MG tablet Take 1 tablet (40 mg total) by mouth daily.   . potassium chloride (KLOR-CON) 10 MEQ tablet TAKE 1 TABLET BY MOUTH DAILY WITH LASIX   . Zinc  Sulfate (ZINC 15 PO) Take by mouth.    No facility-administered encounter medications on file as of 11/06/2019.     Objective:   Goals Addressed              This Visit's Progress     Patient Stated   .  "I want to keep my diabetes under control" (pt-stated)         CARE PLAN ENTRY (see longtitudinal plan of care for additional care plan information)  Current Barriers:  . Social, financial, community barriers:  o None noted specifically. Though patient did not bring up today, it has been just after a year since he lost his wife and his dog in a few weeks time. . Diabetes: uncontrolled though improved per SMBG results;  most recent Q4K 10.3%, complicated by HTN, CKD, depression  . Current antihyperglycemic regimen: metformin 1000 mg BID, glipizide 5 mg w/ big meal, Jardiance 25 mg QAM, Trulicity 3 mg weekly, Basaglar 30 units daily  o APPROVED for Shane Jones, and Basaglar assistance through 01/02/20 . Denies any episodes of hypoglycemia . Current glucose readings: o 3 pm: reports occasionally up to 200s, but sometimes lower. Does not have his meter nearby. . Current meal patterns:  o Sleeps late.  o Eggs and sausage around 3 pm  o Sometimes supper. Smaller meal. Vegetable soup. Occasionally will cook beef, hamburger.  . Cardiovascular risk reduction: o Current hypertensive regimen: furosemide 20 mg QAM, losartan 100 mg QAM, metoprolol XL 100 mg QAM, amlodipine 10 mg daily QAM, doxazosin 4 mg QPM;  - Reports SBP "are good" at home, upon further questioning notes SBP ~130 o Current hyperlipidemia regimen: atorvastatin 40 mg, LDL not at goal on last check  o Current antiplatelet regimen: ASA 325 mg daily; reports no hx CVA . Depression: escitalopram 20 mg daily . GERD: pantoprazole 40 mg daily  Pharmacist Clinical Goal(s):  Marland Kitchen Over the next 90 days, patient with work with PharmD and primary care provider to address optimized medication management  Interventions: . Comprehensive medication review performed, medication list updated in electronic medical record . Inter-disciplinary care team collaboration (see longitudinal plan of care) . Reviewed refill hx. Suspect nonadherence. Most recent refill dates:  o 07/23/19 (should have been refilled around 10/23/19): escitalopram, furosemide, glipizide, metformin o 08/07/19: allopurinol, atorvastatin, losartan, metoprolol succinate, pantoprazole, potassium, amlodipine o 9/13: hydralazine . Discussed goal A1c, goal fasting, goal 2 hour post prandial. Suggested patient start writing down fasting and post prandial readings, patient declined but will bring  glucometer to upcoming PCP appt.  Marland Kitchen Appears patient is consistently taking hydralazine but not doxazosin. Will review with patient at upcoming face to face appointment.   Patient Self Care Activities:  . Patient will check blood glucose BID, document, and provide at future appointments . Patient will focus on medication adherence by continuing to use his pill box  . Patient will take medications as prescribed . Patient will report any questions or concerns to provider   Please see past updates related to this goal by clicking on the "Past Updates" button in the selected goal          Plan:  - Scheduled f/u face to face in ~ 6 weeks prior to PCP visit  Catie Darnelle Maffucci, PharmD, [place], [place] Pharmacist [place] [phone]

## 2019-11-06 NOTE — Patient Instructions (Addendum)
[place],   You really have made great improvement in your blood sugars in the last few months! Let's keep it up!  Please bring your glucometer AND all of your medications to the next appointment. There's a few that it looks like it has been a bit since you filled them, I want to make sure we have the most accurate list of what you are taking before we adjust any medications.   Remember:  Our goal A1c is less than 7%. This corresponds with fasting sugars less than 130 and 2 hour after meal sugars less than 180. Please check your blood sugar twice daily - fasting and 2 hours after your biggest meal  Our goal blood pressure is less than 130/80. Please check your blood pressure a few days a week  Our goal bad cholesterol, or LDL, is less than 70 . This is why it is important to continue taking your atorvastatin. We may need to increase this dose moving forward  Feel free to call me with any questions or concerns. I look forward to our next visit!  Catie Darnelle Maffucci, PharmD, [place], CPP Direct line: [phone] Clinic phone: [phone]  Visit Information  Goals Addressed              This Visit's Progress     Patient Stated   .  "I want to keep my diabetes under control" (pt-stated)         CARE PLAN ENTRY (see longtitudinal plan of care for additional care plan information)  Current Barriers:  . Social, financial, community barriers:  o None noted specifically. Though patient did not bring up today, it has been just after a year since he lost his wife and his dog in a few weeks time. . Diabetes: uncontrolled though improved per SMBG results; most recent X2J 19.4%, complicated by HTN, CKD, depression  . Current antihyperglycemic regimen: metformin 1000 mg BID, glipizide 5 mg w/ big meal, Jardiance 25 mg QAM, Trulicity 3 mg weekly, Basaglar 30 units daily  o APPROVED for [name], and Basaglar assistance through 01/02/20 . Denies any episodes of hypoglycemia . Current glucose  readings: o 3 pm: reports occasionally up to 200s, but sometimes lower. Does not have his meter nearby. . Current meal patterns:  o Sleeps late.  o Eggs and sausage around 3 pm  o Sometimes supper. Smaller meal. Vegetable soup. Occasionally will cook beef, hamburger.  . Cardiovascular risk reduction: o Current hypertensive regimen: furosemide 20 mg QAM, losartan 100 mg QAM, metoprolol XL 100 mg QAM, amlodipine 10 mg daily QAM, doxazosin 4 mg QPM;  - Reports SBP "are good" at home, upon further questioning notes SBP ~130 o Current hyperlipidemia regimen: atorvastatin 40 mg, LDL not at goal on last check  o Current antiplatelet regimen: ASA 325 mg daily; reports no hx CVA . Depression: escitalopram 20 mg daily . GERD: pantoprazole 40 mg daily  Pharmacist Clinical Goal(s):  Marland Kitchen Over the next 90 days, patient with work with PharmD and primary care provider to address optimized medication management  Interventions: . Comprehensive medication review performed, medication list updated in electronic medical record . Inter-disciplinary care team collaboration (see longitudinal plan of care) . Reviewed refill hx. Suspect nonadherence. Most recent refill dates:  o 07/23/19 (should have been refilled around 10/23/19): escitalopram, furosemide, glipizide, metformin o 08/07/19: allopurinol, atorvastatin, losartan, metoprolol succinate, pantoprazole, potassium, amlodipine o 9/13: hydralazine . Discussed goal A1c, goal fasting, goal 2 hour post prandial. Suggested patient start writing down fasting and  post prandial readings, patient declined but will bring glucometer to upcoming PCP appt.  Marland Kitchen Appears patient is consistently taking hydralazine but not doxazosin. Will review with patient at upcoming face to face appointment.   Patient Self Care Activities:  . Patient will check blood glucose BID, document, and provide at future appointments . Patient will focus on medication adherence by continuing to use his  pill box  . Patient will take medications as prescribed . Patient will report any questions or concerns to provider   Please see past updates related to this goal by clicking on the "Past Updates" button in the selected goal         The patient verbalized understanding of instructions provided today and declined a print copy of patient instruction materials.    Plan:  - Scheduled f/u face to face in ~ 6 weeks prior to PCP visit  Catie Darnelle Maffucci, PharmD, [place], [place] Pharmacist [place] [phone]

## 2019-11-14 ENCOUNTER — Telehealth: Payer: Self-pay | Admitting: Family Medicine

## 2019-11-14 MED ORDER — GABAPENTIN 400 MG PO CAPS: 400.0000 mg | ORAL_CAPSULE | Freq: Three times a day (TID) | ORAL | 0 refills | Status: DC

## 2019-11-14 NOTE — Telephone Encounter (Signed)
Sent refill to [facility] per request.

## 2019-11-14 NOTE — Telephone Encounter (Signed)
Medication was sent to mail in pharmacy

## 2019-11-14 NOTE — Telephone Encounter (Signed)
Medication refill gabapentin (NEURONTIN) 400 MG capsule. Patient needing 270 pills.

## 2019-12-03 ENCOUNTER — Ambulatory Visit: Payer: Medicare Other | Admitting: Family Medicine

## 2019-12-08 ENCOUNTER — Other Ambulatory Visit: Payer: Self-pay

## 2019-12-10 ENCOUNTER — Other Ambulatory Visit: Payer: Self-pay

## 2019-12-10 ENCOUNTER — Ambulatory Visit: Payer: Medicare Other | Admitting: Pharmacist

## 2019-12-10 ENCOUNTER — Ambulatory Visit (INDEPENDENT_AMBULATORY_CARE_PROVIDER_SITE_OTHER): Payer: Medicare Other | Admitting: Family Medicine

## 2019-12-10 ENCOUNTER — Encounter: Payer: Self-pay | Admitting: Family Medicine

## 2019-12-10 DIAGNOSIS — E1165 Type 2 diabetes mellitus with hyperglycemia: Secondary | ICD-10-CM

## 2019-12-10 DIAGNOSIS — I10 Essential (primary) hypertension: Secondary | ICD-10-CM

## 2019-12-10 DIAGNOSIS — E785 Hyperlipidemia, unspecified: Secondary | ICD-10-CM

## 2019-12-10 DIAGNOSIS — F32A Depression, unspecified: Secondary | ICD-10-CM

## 2019-12-10 DIAGNOSIS — E1140 Type 2 diabetes mellitus with diabetic neuropathy, unspecified: Secondary | ICD-10-CM

## 2019-12-10 DIAGNOSIS — IMO0002 Reserved for concepts with insufficient information to code with codable children: Secondary | ICD-10-CM

## 2019-12-10 DIAGNOSIS — F419 Anxiety disorder, unspecified: Secondary | ICD-10-CM

## 2019-12-10 LAB — POCT GLYCOSYLATED HEMOGLOBIN (HGB A1C): Hemoglobin A1C: 6.6 % — AB (ref 4.0–5.6)

## 2019-12-10 NOTE — Patient Instructions (Addendum)
Scan your sensor AT LEAST every 8 hours.   Bring ALL OF YOUR MEDICATIONS with you when you come back to see me to review the readings.   Come back and see me on Monday, November 27th at 2:45 pm. We will go over what your blood sugars look like and go through your medications.   Take care!  Catie Darnelle Maffucci, PharmD [phone]  Visit Information  Patient Care Plan: Medication Management    Problem Identified: Diabetes, Hypertension, CKD, Depression     Long-Range Goal: Disease Progression Prevention   This Visit's Progress: On track  Priority: High  Note:   Current Barriers:  . Unable to independently afford treatment regimen . Unable to independently monitor therapeutic efficacy . Unable to achieve control of diabetes   Pharmacist Clinical Goal(s):  Marland Kitchen Over the next 90 days, patient will verbalize ability to afford treatment regimen. . Over the next 90 days, patient will achieve control of diabetes as evidenced by improvement in A1c through collaboration with PharmD and provider.   Interventions: . Inter-disciplinary care team collaboration (see longitudinal plan of care) . Comprehensive medication review performed; medication list updated in electronic medical record  Diabetes: . Controlled per A1c today; current treatment: metformin 1000 mg BID, Jardiance 25 mg daily, Trulicity 3 mg weekly, Basaglar 30 units daily  . Current glucose readings: reports several random readings 250-406 over the past 3 weeks . Reports blurred vision . Notes that his metformin tablets are "huge and smelly" and cause stomach upset. Has been drinking sips of grape soda to attempt to mask the smell/taste of the metformin . Discussed significant possible benefit of CGM to aid in safety, ability to adjust medication regimen. Patient in agreement. Placed sample today, and will place order to DME supplier. . Notes that his mail order pharmacy will allow him to order refills with ~ 1 month remaining. Reports  he is purposely "stockpiling" his medication refills in case he needs moving forward. Have discussed bringing medication bottles to him with next face to face appointment . Switching to metformin ER to see if this formulation is better tolerated. Discussed how grape soda has significant impacts on glucose.   Hypertension: . Controlled; current treatment: furosemide 20 mg QAM, losartan 100 mg QAM, metoprolol XL 100 mg QAM, amlodipine 10 mg daily QAM, doxazosin 4 mg QPM . Recommend to continue current regimen at this time  Hyperlipidemia: . Controlled; current treatment: atorvastatin 40 mg daily . Antiplatelet regimen: aspirin 325 mg daily . Recommend to continue current regimen at this time. Will discuss reduction in ASA dose moving forward  Depression: . Controlled per patient report; current treatment: escitalopram 20 mg daily . Recommend to continue current regimen at this time   Patient Goals/Self-Care Activities . Over the next 90 days, patient will:  - take medications as prescribed check blood glucose TID, document, and provide at future appointments collaborate with provider on medication access solutions  Follow Up Plan: Face to Face appointment with care management team member scheduled for: ~ 2 weeks      Print copy of patient instructions, educational materials, and care plan provided in person.  Plan: Face to Face appointment with care management team member scheduled for:  ~ 3 weeks  Catie Darnelle Maffucci, PharmD, [place], [place] Pharmacist [place] [phone]

## 2019-12-10 NOTE — Patient Instructions (Signed)
Nice to see you. Please continue your current diabetes medications and blood pressure medications.  I will discuss your A1c with Catie and let you know what the results show.

## 2019-12-10 NOTE — Progress Notes (Signed)
  [name], MD Phone: [phone]  Shane Jones is a 74 y.o. male who presents today for f/u.  DIABETES Disease Monitoring: Blood Sugar ranges-notes they are typically less than 200 though he is not checking cosistently Polyuria/phagia/dipsia- no      Optho- reports UTD in March 2021 Medications: Compliance- taking metformin, basaglar 36 u daily, glipizide, jardiance, trulicity Hypoglycemic symptoms-  No  HYPERTENSION  Disease Monitoring  Home BP Monitoring 580-998 systolic though notes this is better than in the past and is mostly well controlled <338 systolic Chest pain- no    Dyspnea- no Medications  Compliance-  Taking hydralazine, losartan, metoprolol, amlodipine, cardura  Edema- no     Social History   Tobacco Use  Smoking Status Former Smoker  . Quit date: 11/20/1978  . Years since quitting: 41.0  Smokeless Tobacco Never Used     ROS see history of present illness  Objective  Physical Exam Vitals:   12/10/19 1540  BP: 118/70  Pulse: 69  Temp: 98.7 F (37.1 C)  SpO2: 97%    BP Readings from Last 3 Encounters:  12/10/19 118/70  09/01/19 120/80  05/22/19 106/72   Wt Readings from Last 3 Encounters:  12/10/19 182 lb 8 oz (82.8 kg)  09/01/19 160 lb (72.6 kg)  05/30/19 175 lb (79.4 kg)    Physical Exam Constitutional:      General: He is not in acute distress.    Appearance: He is not diaphoretic.  Cardiovascular:     Rate and Rhythm: Normal rate and regular rhythm.     Heart sounds: Normal heart sounds.  Pulmonary:     Effort: Pulmonary effort is normal.     Breath sounds: Normal breath sounds.  Musculoskeletal:     Right lower leg: No edema.     Left lower leg: No edema.  Skin:    General: Skin is warm and dry.  Neurological:     Mental Status: He is alert.      Assessment/Plan: Please see individual problem list.  Problem List Items Addressed This Visit    Hypertension (Chronic)    Well-controlled.  The patient will continue  hydralazine 10 mg twice daily, amlodipine 10 mg daily, Cardura 4 mg daily, Lasix 20 mg daily, losartan 100 mg daily, and metoprolol 100 mg daily.      Type 2 diabetes, uncontrolled, with neuropathy (HCC) (Chronic)    Clinical pharmacist saw the patient today as well.  They placed a CGM on him to get an accurate reading of what his glucose has been.  They will follow up with him in about 2 weeks.  A1c checked today.  The patient will continue Trulicity 3 mg daily, Jardiance 25 mg daily, glipizide 5 mg daily, Basaglar 36 units daily, and Metformin 1000 mg twice daily.      Relevant Orders   POCT HgB A1C (Completed)      This visit occurred during the SARS-CoV-2 public health emergency.  Safety protocols were in place, including screening questions prior to the visit, additional usage of staff PPE, and extensive cleaning of exam room while observing appropriate contact time as indicated for disinfecting solutions.    [name], MD [place]

## 2019-12-10 NOTE — Assessment & Plan Note (Signed)
Well-controlled.  The patient will continue hydralazine 10 mg twice daily, amlodipine 10 mg daily, Cardura 4 mg daily, Lasix 20 mg daily, losartan 100 mg daily, and metoprolol 100 mg daily.

## 2019-12-10 NOTE — Chronic Care Management (AMB) (Addendum)
Chronic Care Management   Pharmacy Note  12/11/2019 Name: Shane Jones MRN: [id number] DOB: [birth year]   Subjective:  Shane Jones is a 74 y.o. year old male who is a primary care patient of Shane Jones, Shane Adam, MD. The CCM team was consulted for assistance with chronic disease management and care coordination needs.    Engaged with patient face to face for follow up visit in response to provider referral for pharmacy case management and/or care coordination services.   Consent to Services:  Shane Jones was given information about Chronic Care Management services, agreed to services, and gave verbal consent prior to initiation of services on 07/11/2018. Please see initial visit note for detailed documentation.   SDOH (Social Determinants of Health) assessments and interventions performed:  SDOH Interventions   Flowsheet Row Most Recent Value  SDOH Interventions   Financial Strain Interventions Other (Comment)  [manufacturer assistance]       Objective:  Lab Results  Component Value Date   CREATININE 1.81 (H) 09/01/2019   CREATININE 1.85 (H) 01/14/2019   CREATININE 1.31 12/26/2018    Lab Results  Component Value Date   HGBA1C 6.6 (A) 12/10/2019       Component Value Date/Time   CHOL 194 09/01/2019 1545   TRIG 388.0 (H) 09/01/2019 1545   HDL 35.80 (L) 09/01/2019 1545   CHOLHDL 5 09/01/2019 1545   VLDL 77.6 (H) 09/01/2019 1545   LDLCALC 23 08/07/2016 1156   LDLDIRECT 103.0 09/01/2019 1545    BP Readings from Last 3 Encounters:  12/10/19 118/70  09/01/19 120/80  05/22/19 106/72    Assessment/Interventions: Review of patient past medical history, allergies, medications, health status, including review of consultants reports, laboratory and other test data, was performed as part of comprehensive evaluation and provision of chronic care management services.   Allergies  Allergen Reactions  . Uloric [Febuxostat] Hives    hives    Medications Reviewed Today     Reviewed by Shane Jones, CMA (Certified Medical Assistant) on 12/10/19 at 1542  Med List Status: <None>  Medication Order Taking? Sig Documenting Provider Last Dose Status Informant  Accu-Chek FastClix Lancets MISC [id number] Yes USE UP TO FOUR TIMES DAILY AS DIRECTED Shane Haven, MD Taking Active   ACCU-CHEK GUIDE test strip [id number] Yes USE UP TO FOUR TIMES DAILY AS DIRECTED Shane Haven, MD Taking Active   allopurinol (ZYLOPRIM) 100 MG tablet [id number] Yes TAKE 1 TABLET DAILY Shane Haven, MD Taking Active   amLODipine (NORVASC) 10 MG tablet [id number] Yes TAKE 1 TABLET EVERY DAY Shane Haven, MD Taking Active   aspirin 325 MG tablet [id number] Yes Take 325 mg by mouth daily. [provider] Taking Active   atorvastatin (LIPITOR) 40 MG tablet [id number] Yes TAKE 1 TABLET EVERY DAY Shane Haven, MD Taking Active   B Complex Vitamins (VITAMIN-B COMPLEX PO) [id number] Yes Take by mouth. [provider] Taking Active   BETA CAROTENE PO [id number] Yes Take by mouth. [provider] Taking Active   blood glucose meter kit and supplies [id number] Yes Dispense based on patient and insurance preference. Use up to four times daily as directed. (FOR ICD-10 E10.9, E11.9). Shane Haven, MD Taking Active   Calcium Carbonate-Vitamin D (CALCIUM + D PO) [id number] Yes Take by mouth. [provider] Taking Active   doxazosin (CARDURA) 4 MG tablet [id number] Yes  [provider] Taking Active   Dulaglutide (TRULICITY) 3 IR/4.8NI SOPN [id number] Yes Inject 3 mg  into the skin once a week. [provider] Taking Active   empagliflozin (JARDIANCE) 25 MG TABS tablet [id number] Yes Take 25 mg by mouth daily. Shane Haven, MD Taking Active   escitalopram (LEXAPRO) 20 MG tablet [id number] Yes TAKE 1 TABLET (20 MG) BY MOUTH DAILY Shane Haven, MD Taking Active   furosemide (LASIX) 20 MG tablet [id number] Yes Take one tablet (20 mg  total) by mouth daily. Shane Haven, MD Taking Active   gabapentin (NEURONTIN) 400 MG capsule [id number] Yes Take 1 capsule (400 mg total) by mouth 3 (three) times daily. Shane Haven, MD Taking Active   glipiZIDE (GLUCOTROL) 5 MG tablet [id number] Yes Take 1 tablet (5 mg total) by mouth 2 (two) times daily before a meal. Shane Haven, MD Taking Active            Med Note [name] May 22, 2019 10:39 AM) Taking QAM   hydrALAZINE (APRESOLINE) 10 MG tablet [id number] Yes TAKE 1 TABLET BY MOUTH UP TO TWICE DAILY AS NEEDED IF BLOOD PRESSURE> 130/80 Shane Haven, MD Taking Active   Insulin Glargine [facility] KWIKPEN) 100 UNIT/ML [id number] Yes Inject 28 Units into the skin daily. Shane Haven, MD Taking Active            Med Note [name] Nov 06, 2019  2:35 PM) 30 units  losartan (COZAAR) 100 MG tablet [id number] Yes TAKE 1 TABLET EVERY DAY Shane Haven, MD Taking Active   magnesium 30 MG tablet [id number] Yes Take 30 mg by mouth 2 (two) times daily. [provider] Taking Active   metFORMIN (GLUCOPHAGE) 1000 MG tablet [id number] Yes Take 1 tablet (1,000 mg total) by mouth 2 (two) times daily with a meal. Shane Haven, MD Taking Active   metoprolol succinate (TOPROL-XL) 100 MG 24 hr tablet [id number] Yes TAKE 1 TABLET EVERY DAY Shane Haven, MD Taking Active   Multiple Vitamin (MULTIVITAMIN) tablet [id number] Yes Take 1 tablet by mouth daily. [provider] Taking Active   pantoprazole (PROTONIX) 40 MG tablet [id number] Yes Take 1 tablet (40 mg total) by mouth daily. Shane Haven, MD Taking Active   potassium chloride (KLOR-CON) 10 MEQ tablet [id number] Yes TAKE 1 TABLET BY MOUTH DAILY WITH LASIX Shane Haven, MD Taking Active   Zinc Sulfate (ZINC 15 PO) [id number] Yes Take by mouth. [provider] Taking Active           Patient Active Problem List   Diagnosis Date Noted  . Overweight  05/30/2019  . Acute kidney failure ([place]) 01/22/2019  . Benign hypertensive kidney disease with chronic kidney disease 01/22/2019  . Secondary hyperparathyroidism of renal origin ([place]) 01/22/2019  . Stage 3a chronic kidney disease ([place]) 01/22/2019  . Lightheadedness 12/19/2018  . Diarrhea 01/21/2018  . Skin lesions 01/21/2018  . Esophagitis   . Proteinuria 10/10/2017  . Onychomycosis 03/19/2017  . Anxiety and depression 09/19/2016  . Hypokalemia 09/19/2016  . Loss of height 05/01/2016  . Left shoulder pain 01/31/2016  . Prostate cancer screening 09/27/2012  . Sleep apnea 11/22/2011  . Chronic back pain 08/17/2011  . Hyperlipidemia 08/17/2011  . Gout 08/17/2011  . Type 2 diabetes, uncontrolled, with neuropathy ([place]) 02/20/2011  . Neuropathy ([place]) 02/17/2011  . Hypertension 10/13/2010    Medication Assistance: Application for [employer], [place] medication assistance program in process. Anticipated assistance start date TBD. See plan of care below for additional detail.  Patient Care Plan: Medication Management    Problem Identified: Diabetes, Hypertension, CKD, Depression     Long-Range Goal: Disease Progression Prevention   This Visit's Progress: On track  Priority: High  Note:   Current Barriers:  . Unable to independently afford treatment regimen . Unable to independently monitor therapeutic efficacy . Unable to achieve control of diabetes   Pharmacist Clinical Goal(s):  Marland Kitchen Over the next 90 days, patient will verbalize ability to afford treatment regimen. . Over the next 90 days, patient will achieve control of diabetes as evidenced by improvement in A1c through collaboration with PharmD and provider.   Interventions: . Inter-disciplinary care team collaboration (see longitudinal plan of care) . Comprehensive medication review performed; medication list updated in electronic medical record  Diabetes: . Controlled per A1c today; current treatment: metformin 1000 mg BID,  Jardiance 25 mg daily, Trulicity 3 mg weekly, Basaglar 30 units daily  . Current glucose readings: reports several random readings 250-406 over the past 3 weeks . Reports blurred vision . Notes that his metformin tablets are "huge and smelly" and cause stomach upset. Has been drinking sips of grape soda to attempt to mask the smell/taste of the metformin . Discussed significant possible benefit of CGM to aid in safety, ability to adjust medication regimen. Patient in agreement. Placed sample today, and will place order to DME supplier. . Notes that his mail order pharmacy will allow him to order refills with ~ 1 month remaining. Reports he is purposely "stockpiling" his medication refills in case he needs moving forward. Have discussed bringing medication bottles to him with next face to face appointment . Switching to metformin ER to see if this formulation is better tolerated. Discussed how grape soda has significant impacts on glucose.   Hypertension: . Controlled; current treatment: furosemide 20 mg QAM, losartan 100 mg QAM, metoprolol XL 100 mg QAM, amlodipine 10 mg daily QAM, doxazosin 4 mg QPM . Recommend to continue current regimen at this time  Hyperlipidemia: . Controlled; current treatment: atorvastatin 40 mg daily . Antiplatelet regimen: aspirin 325 mg daily . Recommend to continue current regimen at this time. Will discuss reduction in ASA dose moving forward  Depression: . Controlled per patient report; current treatment: escitalopram 20 mg daily . Recommend to continue current regimen at this time   Patient Goals/Self-Care Activities . Over the next 90 days, patient will:  - take medications as prescribed check blood glucose TID, document, and provide at future appointments collaborate with provider on medication access solutions  Follow Up Plan: Face to Face appointment with care management team member scheduled for: ~ 2 weeks       Plan: Face to Face appointment  with care management team member scheduled for:  ~ 3 weeks  Catie Darnelle Maffucci, PharmD, [place], [place] Pharmacist [place] [phone]

## 2019-12-10 NOTE — Assessment & Plan Note (Signed)
Clinical pharmacist saw the patient today as well.  They placed a CGM on him to get an accurate reading of what his glucose has been.  They will follow up with him in about 2 weeks.  A1c checked today.  The patient will continue Trulicity 3 mg daily, Jardiance 25 mg daily, glipizide 5 mg daily, Basaglar 36 units daily, and Metformin 1000 mg twice daily.

## 2019-12-11 ENCOUNTER — Ambulatory Visit (INDEPENDENT_AMBULATORY_CARE_PROVIDER_SITE_OTHER): Payer: Medicare Other

## 2019-12-11 DIAGNOSIS — E1165 Type 2 diabetes mellitus with hyperglycemia: Secondary | ICD-10-CM

## 2019-12-11 DIAGNOSIS — E1140 Type 2 diabetes mellitus with diabetic neuropathy, unspecified: Secondary | ICD-10-CM

## 2019-12-11 DIAGNOSIS — IMO0002 Reserved for concepts with insufficient information to code with codable children: Secondary | ICD-10-CM

## 2019-12-11 MED ORDER — METFORMIN HCL ER 500 MG PO TB24: 1000.0000 mg | ORAL_TABLET | Freq: Two times a day (BID) | ORAL | 3 refills | Status: DC

## 2019-12-11 NOTE — Addendum Note (Signed)
Addended by: De Hollingshead on: 12/11/2019 08:26 AM   Modules accepted: Orders

## 2019-12-11 NOTE — Progress Notes (Signed)
Patient presented for [facility] placement and instruction. Per Providers order from 12-10-19. Patient voiced no concerns nor showed signs of distress during placement. Also instructed patient on how to record and give access to [place] [facility] so we can have accurate monitoring system. Instructed patient that if further questions, they can call clinic to be given further direction.

## 2019-12-23 ENCOUNTER — Ambulatory Visit: Payer: Medicare Other | Admitting: Pharmacist

## 2019-12-23 DIAGNOSIS — IMO0002 Reserved for concepts with insufficient information to code with codable children: Secondary | ICD-10-CM

## 2019-12-23 DIAGNOSIS — I129 Hypertensive chronic kidney disease with stage 1 through stage 4 chronic kidney disease, or unspecified chronic kidney disease: Secondary | ICD-10-CM

## 2019-12-23 DIAGNOSIS — I10 Essential (primary) hypertension: Secondary | ICD-10-CM

## 2019-12-23 NOTE — Patient Instructions (Signed)
Visit Information  Patient Care Plan: Medication Management    Problem Identified: Diabetes, Hypertension, CKD, Depression     Long-Range Goal: Disease Progression Prevention   This Visit's Progress: On track  Recent Progress: On track  Priority: High  Note:   Current Barriers:  . Unable to independently afford treatment regimen . Unable to independently monitor therapeutic efficacy . Unable to achieve control of diabetes   Pharmacist Clinical Goal(s):  Marland Kitchen Over the next 90 days, patient will verbalize ability to afford treatment regimen. . Over the next 90 days, patient will achieve control of diabetes as evidenced by improvement in A1c through collaboration with PharmD and provider.   Interventions: . Inter-disciplinary care team collaboration (see longitudinal plan of care) . Comprehensive medication review performed; medication list updated in electronic medical record  Diabetes: . Controlled per A1c today; current treatment: metformin 1000 mg BID, Jardiance 25 mg daily, Trulicity 3 mg weekly, Basaglar 30 units daily  . Current glucose readings: loving using CGM. Notes that [facility] called, and that as he has traditional Medicare, he must have orders to inject insulin at least 3 times daily for coverage of Libre. Notes that the $75 out of pocket would be difficult for him right now, as he and his son sat down and reviewed expenses, and he is spending more every month than he brings in.  . Placed Care Guide referral for financial assistance. Patient is over income for Extra Help due to SSI and state retirement.  . Will review full 2 weeks of [place] at appointment on Monday.   Hypertension: . Controlled; current treatment: furosemide 20 mg QAM, losartan 100 mg QAM, metoprolol XL 100 mg QAM, amlodipine 10 mg daily QAM, doxazosin 4 mg QPM . Recommend to continue current regimen at this time  Hyperlipidemia: . Controlled; current treatment: atorvastatin 40 mg  daily . Antiplatelet regimen: aspirin 325 mg daily . Recommend to continue current regimen at this time. Will discuss reduction in ASA dose moving forward  Depression: . Controlled per patient report; current treatment: escitalopram 20 mg daily . Recommend to continue current regimen at this time   Patient Goals/Self-Care Activities . Over the next 90 days, patient will:  - take medications as prescribed check blood glucose TID, document, and provide at future appointments collaborate with provider on medication access solutions  Follow Up Plan: Face to Face appointment with care management team member scheduled for: ~ 1 week       The patient verbalized understanding of instructions, educational materials, and care plan provided today and declined offer to receive copy of patient instructions, educational materials, and care plan.   Plan: Face to Face appointment with care management team member scheduled for:  ~ 1 week as previously scheduled  Catie Darnelle Maffucci, PharmD, [place], [place] Clinical Pharmacist [employer] at [facility] [phone]

## 2019-12-23 NOTE — Chronic Care Management (AMB) (Signed)
Chronic Care Management   Pharmacy Note  12/23/2019 Name: Shane Jones MRN: [id number] DOB: 1945/05/30  Subjective:  Shane Jones is a 74 y.o. year old male who is a primary care patient of Caryl Bis, Angela Adam, MD. The CCM team was consulted for assistance with chronic disease management and care coordination needs.    Engaged with patient by telephone for follow up on CGM order in response to provider referral for pharmacy case management and/or care coordination services.   Consent to Services:  [name] was given information about Chronic Care Management services, agreed to services, and gave verbal consent prior to initiation of services on 07/08/2018. Please see initial visit note for detailed documentation.   Objective:  Lab Results  Component Value Date   CREATININE 1.81 (H) 09/01/2019   CREATININE 1.85 (H) 01/14/2019   CREATININE 1.31 12/26/2018    Lab Results  Component Value Date   HGBA1C 6.6 (A) 12/10/2019       Component Value Date/Time   CHOL 194 09/01/2019 1545   TRIG 388.0 (H) 09/01/2019 1545   HDL 35.80 (L) 09/01/2019 1545   CHOLHDL 5 09/01/2019 1545   VLDL 77.6 (H) 09/01/2019 1545   LDLCALC 23 08/07/2016 1156   LDLDIRECT 103.0 09/01/2019 1545     BP Readings from Last 3 Encounters:  12/10/19 118/70  09/01/19 120/80  05/22/19 106/72    Assessment/Interventions: Review of patient past medical history, allergies, medications, health status, including review of consultants reports, laboratory and other test data, was performed as part of comprehensive evaluation and provision of chronic care management services.   SDOH (Social Determinants of Health) assessments and interventions performed:    CCM Care Plan  Allergies  Allergen Reactions  . Uloric [Febuxostat] Hives    hives    Medications Reviewed Today    Reviewed by Gordy Councilman, CMA (Certified Medical Assistant) on 12/10/19 at 1542  Med List Status: <None>  Medication Order Taking? Sig  Documenting Provider Last Dose Status Informant  Accu-Chek FastClix Lancets MISC [id number] Yes USE UP TO FOUR TIMES DAILY AS DIRECTED Leone Haven, MD Taking Active   ACCU-CHEK GUIDE test strip [id number] Yes USE UP TO FOUR TIMES DAILY AS DIRECTED Leone Haven, MD Taking Active   allopurinol (ZYLOPRIM) 100 MG tablet [id number] Yes TAKE 1 TABLET DAILY Leone Haven, MD Taking Active   amLODipine (NORVASC) 10 MG tablet [id number] Yes TAKE 1 TABLET EVERY DAY Leone Haven, MD Taking Active   aspirin 325 MG tablet [id number] Yes Take 325 mg by mouth daily. [provider] Taking Active   atorvastatin (LIPITOR) 40 MG tablet [id number] Yes TAKE 1 TABLET EVERY DAY Leone Haven, MD Taking Active   B Complex Vitamins (VITAMIN-B COMPLEX PO) [id number] Yes Take by mouth. [provider] Taking Active   BETA CAROTENE PO [id number] Yes Take by mouth. [provider] Taking Active   blood glucose meter kit and supplies [id number] Yes Dispense based on patient and insurance preference. Use up to four times daily as directed. (FOR ICD-10 E10.9, E11.9). Leone Haven, MD Taking Active   Calcium Carbonate-Vitamin D (CALCIUM + D PO) [id number] Yes Take by mouth. [provider] Taking Active   doxazosin (CARDURA) 4 MG tablet [id number] Yes  [provider] Taking Active   Dulaglutide (TRULICITY) 3 HN/8.8TJ SOPN [id number] Yes Inject 3 mg into the skin once a week. [provider] Taking Active   empagliflozin (JARDIANCE) 25 MG TABS tablet [id number] Yes  Take 25 mg by mouth daily. Leone Haven, MD Taking Active   escitalopram (LEXAPRO) 20 MG tablet [id number] Yes TAKE 1 TABLET (20 MG) BY MOUTH DAILY Leone Haven, MD Taking Active   furosemide (LASIX) 20 MG tablet [id number] Yes Take one tablet (20 mg total) by mouth daily. Leone Haven, MD Taking Active   gabapentin (NEURONTIN) 400 MG capsule [id number] Yes Take 1 capsule (400 mg  total) by mouth 3 (three) times daily. Leone Haven, MD Taking Active   glipiZIDE (GLUCOTROL) 5 MG tablet [id number] Yes Take 1 tablet (5 mg total) by mouth 2 (two) times daily before a meal. Leone Haven, MD Taking Active            Med Note [name] May 22, 2019 10:39 AM) Taking QAM   hydrALAZINE (APRESOLINE) 10 MG tablet [id number] Yes TAKE 1 TABLET BY MOUTH UP TO TWICE DAILY AS NEEDED IF BLOOD PRESSURE> 130/80 Leone Haven, MD Taking Active   Insulin Glargine [facility] KWIKPEN) 100 UNIT/ML [id number] Yes Inject 28 Units into the skin daily. Leone Haven, MD Taking Active            Med Note [name] Nov 06, 2019  2:35 PM) 30 units  losartan (COZAAR) 100 MG tablet [id number] Yes TAKE 1 TABLET EVERY DAY Leone Haven, MD Taking Active   magnesium 30 MG tablet [id number] Yes Take 30 mg by mouth 2 (two) times daily. [provider] Taking Active   metFORMIN (GLUCOPHAGE) 1000 MG tablet [id number] Yes Take 1 tablet (1,000 mg total) by mouth 2 (two) times daily with a meal. Leone Haven, MD Taking Active   metoprolol succinate (TOPROL-XL) 100 MG 24 hr tablet [id number] Yes TAKE 1 TABLET EVERY DAY Leone Haven, MD Taking Active   Multiple Vitamin (MULTIVITAMIN) tablet [id number] Yes Take 1 tablet by mouth daily. [provider] Taking Active   pantoprazole (PROTONIX) 40 MG tablet [id number] Yes Take 1 tablet (40 mg total) by mouth daily. Leone Haven, MD Taking Active   potassium chloride (KLOR-CON) 10 MEQ tablet [id number] Yes TAKE 1 TABLET BY MOUTH DAILY WITH LASIX Leone Haven, MD Taking Active   Zinc Sulfate (ZINC 15 PO) [id number] Yes Take by mouth. [provider] Taking Active           Patient Active Problem List   Diagnosis Date Noted  . Overweight 05/30/2019  . Acute kidney failure ([place]) 01/22/2019  . Benign hypertensive kidney disease with chronic kidney disease 01/22/2019  .  Secondary hyperparathyroidism of renal origin ([place]) 01/22/2019  . Stage 3a chronic kidney disease ([place]) 01/22/2019  . Lightheadedness 12/19/2018  . Diarrhea 01/21/2018  . Skin lesions 01/21/2018  . Esophagitis   . Proteinuria 10/10/2017  . Onychomycosis 03/19/2017  . Anxiety and depression 09/19/2016  . Hypokalemia 09/19/2016  . Loss of height 05/01/2016  . Left shoulder pain 01/31/2016  . Prostate cancer screening 09/27/2012  . Sleep apnea 11/22/2011  . Chronic back pain 08/17/2011  . Hyperlipidemia 08/17/2011  . Gout 08/17/2011  . Type 2 diabetes, uncontrolled, with neuropathy ([place]) 02/20/2011  . Neuropathy ([place]) 02/17/2011  . Hypertension 10/13/2010    Conditions to be addressed/monitored per PCP order: HTN, HLD and DMII  Patient Care Plan: Medication Management    Problem Identified: Diabetes, Hypertension, CKD, Depression     Long-Range Goal: Disease Progression Prevention   This Visit's Progress: On track  Recent Progress:  On track  Priority: High  Note:   Current Barriers:  . Unable to independently afford treatment regimen . Unable to independently monitor therapeutic efficacy . Unable to achieve control of diabetes   Pharmacist Clinical Goal(s):  Marland Kitchen Over the next 90 days, patient will verbalize ability to afford treatment regimen. . Over the next 90 days, patient will achieve control of diabetes as evidenced by improvement in A1c through collaboration with PharmD and provider.   Interventions: . Inter-disciplinary care team collaboration (see longitudinal plan of care) . Comprehensive medication review performed; medication list updated in electronic medical record  Diabetes: . Controlled per A1c today; current treatment: metformin 1000 mg BID, Jardiance 25 mg daily, Trulicity 3 mg weekly, Basaglar 30 units daily  . Current glucose readings: loving using CGM. Notes that [facility] called, and that as he has traditional Medicare, he must have orders to inject  insulin at least 3 times daily for coverage of Libre. Notes that the $75 out of pocket would be difficult for him right now, as he and his son sat down and reviewed expenses, and he is spending more every month than he brings in.  . Placed Care Guide referral for financial assistance. Patient is over income for Extra Help due to SSI and state retirement.  . Will review full 2 weeks of [place] at appointment on Monday.   Hypertension: . Controlled; current treatment: furosemide 20 mg QAM, losartan 100 mg QAM, metoprolol XL 100 mg QAM, amlodipine 10 mg daily QAM, doxazosin 4 mg QPM . Recommend to continue current regimen at this time  Hyperlipidemia: . Controlled; current treatment: atorvastatin 40 mg daily . Antiplatelet regimen: aspirin 325 mg daily . Recommend to continue current regimen at this time. Will discuss reduction in ASA dose moving forward  Depression: . Controlled per patient report; current treatment: escitalopram 20 mg daily . Recommend to continue current regimen at this time   Patient Goals/Self-Care Activities . Over the next 90 days, patient will:  - take medications as prescribed check blood glucose TID, document, and provide at future appointments collaborate with provider on medication access solutions  Follow Up Plan: Face to Face appointment with care management team member scheduled for: ~ 1 week      Medication Assistance: Application for [employer], Trulicity) and BI ([place]) medication assistance program in process. Anticipated assistance start date TBD. See plan of care above for additional detail.   Plan: Face to Face appointment with care management team member scheduled for:  ~ 1 week as previously scheduled  Catie Darnelle Maffucci, PharmD, [place], [place] Clinical Pharmacist [employer] at [facility] [phone]

## 2019-12-29 ENCOUNTER — Ambulatory Visit: Payer: Medicare Other | Admitting: Pharmacist

## 2019-12-29 ENCOUNTER — Other Ambulatory Visit: Payer: Self-pay

## 2019-12-29 DIAGNOSIS — IMO0002 Reserved for concepts with insufficient information to code with codable children: Secondary | ICD-10-CM

## 2019-12-29 DIAGNOSIS — I10 Essential (primary) hypertension: Secondary | ICD-10-CM

## 2019-12-29 DIAGNOSIS — E785 Hyperlipidemia, unspecified: Secondary | ICD-10-CM

## 2019-12-29 MED ORDER — BASAGLAR KWIKPEN 100 UNIT/ML ~~LOC~~ SOPN: 30.0000 [IU] | PEN_INJECTOR | Freq: Every day | SUBCUTANEOUS | 0 refills | Status: DC

## 2019-12-29 MED ORDER — TRULICITY 4.5 MG/0.5ML ~~LOC~~ SOAJ: 4.5000 mg | SUBCUTANEOUS | 1 refills | Status: DC

## 2019-12-29 MED ORDER — PEN NEEDLES 32G X 4 MM MISC: 3 refills | Status: DC

## 2019-12-29 MED ORDER — INSULIN LISPRO (1 UNIT DIAL) 100 UNIT/ML (KWIKPEN): PEN_INJECTOR | SUBCUTANEOUS | 0 refills | Status: DC

## 2019-12-29 NOTE — Patient Instructions (Addendum)
Continue metformin 1000 mg twice daily. Continue Jardiance 25 mg daily.   Inject Basaglar 30 units daily. Increase Trulicity to 4.5 mg weekly. Inject Humalog (mealtime insulin) if you pre-meal blood sugar is >200. Inject 5 units up to 3 times daily. DO NOT use Humalog if you are not about to eat a meal. Inject ~15-20 minutes before starting the meal.   Call [employer] customer service line to report that you had 2 meters stop working before the 14 days was up.   Please call me with questions or concerns!  Catie Darnelle Maffucci, PharmD [phone]  Visit Information  Patient Care Plan: Medication Management    Problem Identified: Diabetes, Hypertension, CKD, Depression     Long-Range Goal: Disease Progression Prevention   This Visit's Progress: On track  Recent Progress: On track  Priority: High  Note:   Current Barriers:  . Unable to independently afford treatment regimen . Unable to independently monitor therapeutic efficacy . Unable to achieve control of diabetes   Pharmacist Clinical Goal(s):  Marland Kitchen Over the next 90 days, patient will verbalize ability to afford treatment regimen. . Over the next 90 days, patient will achieve control of diabetes as evidenced by improvement in A1c through collaboration with PharmD and provider.   Interventions: . Inter-disciplinary care team collaboration (see longitudinal plan of care) . Comprehensive medication review performed; medication list updated in electronic medical record  Diabetes: . Controlled per A1c but SMBG elevated; current treatment: metformin 1000 mg BID, Jardiance 25 mg daily, Trulicity 3 mg weekly, Basaglar 30 units daily  . Current glucose readings:  Date of Download:  % Time CGM is active: 97% Average Glucose: 181 mg/dL Glucose Management Indicator: 7.6  Glucose Variability: 23.8 (goal <36%) Time in Goal:  - Time in range 70-180: 53% - Time above range: 47% - Time below range: 0% Observed patterns: elevated throughout the day  with post-prandial spikes.   . Patient needs improved post prandial coverage. Increase Trulicity to 4.5 mg weekly. Start Humalog 5 units up to TID with meals if pre-meal glucose >200. Called these prescriptions to [facility].  . Continue metformin 1000 mg BID, Jardiance 25 mg daily, Basaglar 30 units daily.  . Provided with new sample of Libre 2 CGM today, as previous 2 did not last 14 days. Provided with Tegaderm to cover and protect. Submitting updated order to [facility] . Received patient portions of [employer] and [employer] applications. Will collect provider portions and submit to respective companies. Will collaborate w/ CPhT for follow up.   Hypertension: . Controlled; current treatment: furosemide 20 mg QAM, losartan 100 mg QAM, metoprolol XL 100 mg QAM, amlodipine 10 mg daily QAM, doxazosin 4 mg QPM . Recommend to continue current regimen at this time  Hyperlipidemia: . Controlled; current treatment: atorvastatin 40 mg daily . Antiplatelet regimen: aspirin 325 mg daily . Recommend to continue current regimen at this time. Will discuss reduction in ASA dose moving forward  Depression: . Controlled per patient report; current treatment: escitalopram 20 mg daily . Recommend to continue current regimen at this time   Patient Goals/Self-Care Activities . Over the next 90 days, patient will:  - take medications as prescribed check blood glucose TID, document, and provide at future appointments collaborate with provider on medication access solutions  Follow Up Plan: Face to Face appointment with care management team member scheduled for: ~ 2 weeks        Print copy of patient instructions, educational materials, and care plan provided in person.  Plan: Telephone follow up appointment with care management team member scheduled for: ~ 2 weeks  Catie Feliz Beam, PharmD, [place], CPP Clinical Pharmacist [employer] at [employer] [phone]

## 2019-12-29 NOTE — Chronic Care Management (AMB) (Signed)
Chronic Care Management   Pharmacy Note  12/29/2019 Name: Shane Jones MRN: [id number] DOB: [birth year]  Subjective:  Shane Jones is a 74 y.o. year old male who is a primary care patient of Caryl Bis, Angela Adam, MD. The CCM team was consulted for assistance with chronic disease management and care coordination needs.    Engaged with patient face to face for follow up visit in response to provider referral for pharmacy case management and/or care coordination services.   Consent to Services:  Shane Jones was given information about Chronic Care Management services, agreed to services, and gave verbal consent prior to initiation of services on 07/11/2018. Please see initial visit note for detailed documentation.   Objective:  Lab Results  Component Value Date   CREATININE 1.81 (H) 09/01/2019   CREATININE 1.85 (H) 01/14/2019   CREATININE 1.31 12/26/2018    Lab Results  Component Value Date   HGBA1C 6.6 (A) 12/10/2019       Component Value Date/Time   CHOL 194 09/01/2019 1545   TRIG 388.0 (H) 09/01/2019 1545   HDL 35.80 (L) 09/01/2019 1545   CHOLHDL 5 09/01/2019 1545   VLDL 77.6 (H) 09/01/2019 1545   LDLCALC 23 08/07/2016 1156   LDLDIRECT 103.0 09/01/2019 1545     BP Readings from Last 3 Encounters:  12/10/19 118/70  09/01/19 120/80  05/22/19 106/72    Assessment/Interventions: Review of patient past medical history, allergies, medications, health status, including review of consultants reports, laboratory and other test data, was performed as part of comprehensive evaluation and provision of chronic care management services.   SDOH (Social Determinants of Health) assessments and interventions performed:    CCM Care Plan  Allergies  Allergen Reactions  . Uloric [Febuxostat] Hives    hives    Medications Reviewed Today    Reviewed by Gordy Councilman, CMA (Certified Medical Assistant) on 12/10/19 at 1542  Med List Status: <None>  Medication Order Taking? Sig  Documenting Provider Last Dose Status Informant  Accu-Chek FastClix Lancets MISC [id number] Yes USE UP TO FOUR TIMES DAILY AS DIRECTED Leone Haven, MD Taking Active   ACCU-CHEK GUIDE test strip [id number] Yes USE UP TO FOUR TIMES DAILY AS DIRECTED Leone Haven, MD Taking Active   allopurinol (ZYLOPRIM) 100 MG tablet [id number] Yes TAKE 1 TABLET DAILY Leone Haven, MD Taking Active   amLODipine (NORVASC) 10 MG tablet [id number] Yes TAKE 1 TABLET EVERY DAY Leone Haven, MD Taking Active   aspirin 325 MG tablet [id number] Yes Take 325 mg by mouth daily. [provider] Taking Active   atorvastatin (LIPITOR) 40 MG tablet [id number] Yes TAKE 1 TABLET EVERY DAY Leone Haven, MD Taking Active   B Complex Vitamins (VITAMIN-B COMPLEX PO) [id number] Yes Take by mouth. [provider] Taking Active   BETA CAROTENE PO [id number] Yes Take by mouth. [provider] Taking Active   blood glucose meter kit and supplies [id number] Yes Dispense based on patient and insurance preference. Use up to four times daily as directed. (FOR ICD-10 E10.9, E11.9). Leone Haven, MD Taking Active   Calcium Carbonate-Vitamin D (CALCIUM + D PO) [id number] Yes Take by mouth. [provider] Taking Active   doxazosin (CARDURA) 4 MG tablet [id number] Yes  [provider] Taking Active   Dulaglutide (TRULICITY) 3 IZ/1.2WP SOPN [id number] Yes Inject 3 mg into the skin once a week. [provider] Taking Active   empagliflozin (JARDIANCE) 25 MG TABS tablet [id number] Yes Take  25 mg by mouth daily. Leone Haven, MD Taking Active   escitalopram (LEXAPRO) 20 MG tablet [id number] Yes TAKE 1 TABLET (20 MG) BY MOUTH DAILY Leone Haven, MD Taking Active   furosemide (LASIX) 20 MG tablet [id number] Yes Take one tablet (20 mg total) by mouth daily. Leone Haven, MD Taking Active   gabapentin (NEURONTIN) 400 MG capsule [id number] Yes Take 1 capsule (400 mg  total) by mouth 3 (three) times daily. Leone Haven, MD Taking Active   glipiZIDE (GLUCOTROL) 5 MG tablet [id number] Yes Take 1 tablet (5 mg total) by mouth 2 (two) times daily before a meal. Leone Haven, MD Taking Active            Med Note Shane Jones May 22, 2019 10:39 AM) Taking QAM   hydrALAZINE (APRESOLINE) 10 MG tablet [id number] Yes TAKE 1 TABLET BY MOUTH UP TO TWICE DAILY AS NEEDED IF BLOOD PRESSURE> 130/80 Leone Haven, MD Taking Active   Insulin Glargine [facility] KWIKPEN) 100 UNIT/ML [id number] Yes Inject 28 Units into the skin daily. Leone Haven, MD Taking Active            Med Note Shane Jones Nov 06, 2019  2:35 PM) 30 units  losartan (COZAAR) 100 MG tablet [id number] Yes TAKE 1 TABLET EVERY DAY Leone Haven, MD Taking Active   magnesium 30 MG tablet [id number] Yes Take 30 mg by mouth 2 (two) times daily. [provider] Taking Active   metFORMIN (GLUCOPHAGE) 1000 MG tablet [id number] Yes Take 1 tablet (1,000 mg total) by mouth 2 (two) times daily with a meal. Leone Haven, MD Taking Active   metoprolol succinate (TOPROL-XL) 100 MG 24 hr tablet [id number] Yes TAKE 1 TABLET EVERY DAY Leone Haven, MD Taking Active   Multiple Vitamin (MULTIVITAMIN) tablet [id number] Yes Take 1 tablet by mouth daily. [provider] Taking Active   pantoprazole (PROTONIX) 40 MG tablet [id number] Yes Take 1 tablet (40 mg total) by mouth daily. Leone Haven, MD Taking Active   potassium chloride (KLOR-CON) 10 MEQ tablet [id number] Yes TAKE 1 TABLET BY MOUTH DAILY WITH LASIX Leone Haven, MD Taking Active   Zinc Sulfate (ZINC 15 PO) [id number] Yes Take by mouth. [provider] Taking Active           Patient Active Problem List   Diagnosis Date Noted  . Overweight 05/30/2019  . Acute kidney failure ([place]) 01/22/2019  . Benign hypertensive kidney disease with chronic kidney disease 01/22/2019  .  Secondary hyperparathyroidism of renal origin ([place]) 01/22/2019  . Stage 3a chronic kidney disease ([place]) 01/22/2019  . Lightheadedness 12/19/2018  . Diarrhea 01/21/2018  . Skin lesions 01/21/2018  . Esophagitis   . Proteinuria 10/10/2017  . Onychomycosis 03/19/2017  . Anxiety and depression 09/19/2016  . Hypokalemia 09/19/2016  . Loss of height 05/01/2016  . Left shoulder pain 01/31/2016  . Prostate cancer screening 09/27/2012  . Sleep apnea 11/22/2011  . Chronic back pain 08/17/2011  . Hyperlipidemia 08/17/2011  . Gout 08/17/2011  . Type 2 diabetes, uncontrolled, with neuropathy ([place]) 02/20/2011  . Neuropathy ([place]) 02/17/2011  . Hypertension 10/13/2010    Conditions to be addressed/monitored per PCP order: HTN, HLD and DMII  Patient Care Plan: Medication Management    Problem Identified: Diabetes, Hypertension, CKD, Depression     Long-Range Goal: Disease Progression Prevention   This Visit's Progress: On track  Recent Progress: On  track  Priority: High  Note:   Current Barriers:  . Unable to independently afford treatment regimen . Unable to independently monitor therapeutic efficacy . Unable to achieve control of diabetes   Pharmacist Clinical Goal(s):  Marland Kitchen Over the next 90 days, patient will verbalize ability to afford treatment regimen. . Over the next 90 days, patient will achieve control of diabetes as evidenced by improvement in A1c through collaboration with PharmD and provider.   Interventions: . Inter-disciplinary care team collaboration (see longitudinal plan of care) . Comprehensive medication review performed; medication list updated in electronic medical record  Diabetes: . Controlled per A1c but SMBG elevated; current treatment: metformin 1000 mg BID, Jardiance 25 mg daily, Trulicity 3 mg weekly, Basaglar 30 units daily  . Current glucose readings:  Date of Download:  % Time CGM is active: 97% Average Glucose: 181 mg/dL Glucose Management Indicator:  7.6  Glucose Variability: 23.8 (goal <36%) Time in Goal:  - Time in range 70-180: 53% - Time above range: 47% - Time below range: 0% Observed patterns: elevated throughout the day with post-prandial spikes.   . Patient needs improved post prandial coverage. Increase Trulicity to 4.5 mg weekly. Start Humalog 5 units up to TID with meals if pre-meal glucose >200. Called these prescriptions to [facility].  . Continue metformin 1000 mg BID, Jardiance 25 mg daily, Basaglar 30 units daily.  . Provided with new sample of Libre 2 CGM today, as previous 2 did not last 14 days. Provided with Tegaderm to cover and protect. Submitting updated order to [facility] . Received patient portions of [employer] and [employer] applications. Will collect provider portions and submit to respective companies. Will collaborate w/ CPhT for follow up.   Hypertension: . Controlled; current treatment: furosemide 20 mg QAM, losartan 100 mg QAM, metoprolol XL 100 mg QAM, amlodipine 10 mg daily QAM, doxazosin 4 mg QPM . Recommend to continue current regimen at this time  Hyperlipidemia: . Controlled; current treatment: atorvastatin 40 mg daily . Antiplatelet regimen: aspirin 325 mg daily . Recommend to continue current regimen at this time. Will discuss reduction in ASA dose moving forward  Depression: . Controlled per patient report; current treatment: escitalopram 20 mg daily . Recommend to continue current regimen at this time   Patient Goals/Self-Care Activities . Over the next 90 days, patient will:  - take medications as prescribed check blood glucose TID, document, and provide at future appointments collaborate with provider on medication access solutions  Follow Up Plan: Face to Face appointment with care management team member scheduled for: ~ 2 weeks       Medication Assistance: Application for [employer], [employer] medication assistance program in process. Anticipated assistance start date TBD. See plan of care  above for additional detail.   Plan: Telephone follow up appointment with care management team member scheduled for: ~ 2 weeks  Catie Darnelle Maffucci, PharmD, [place], [place] Clinical Pharmacist [employer] at [employer] [phone]

## 2020-01-01 ENCOUNTER — Telehealth: Payer: Self-pay | Admitting: Family Medicine

## 2020-01-01 NOTE — Telephone Encounter (Signed)
Shane Jones 01/01/2020 Called pt regarding community resource referral received. Left message for pt to call me back, my info is [phone] please see ref notes for more details.  Shane Jones Care Guide, Embedded Care Coordination [place], Care Management    

## 2020-01-12 ENCOUNTER — Ambulatory Visit: Payer: Medicare Other | Admitting: Pharmacist

## 2020-01-12 DIAGNOSIS — I129 Hypertensive chronic kidney disease with stage 1 through stage 4 chronic kidney disease, or unspecified chronic kidney disease: Secondary | ICD-10-CM

## 2020-01-12 DIAGNOSIS — E785 Hyperlipidemia, unspecified: Secondary | ICD-10-CM

## 2020-01-12 DIAGNOSIS — I10 Essential (primary) hypertension: Secondary | ICD-10-CM

## 2020-01-12 DIAGNOSIS — IMO0002 Reserved for concepts with insufficient information to code with codable children: Secondary | ICD-10-CM

## 2020-01-12 DIAGNOSIS — E1140 Type 2 diabetes mellitus with diabetic neuropathy, unspecified: Secondary | ICD-10-CM

## 2020-01-12 NOTE — Patient Instructions (Addendum)
Try the extended release metformin.   Take Basaglar (long acting insulin) 30 units once a day every day Take Humalog (meal time insulin) 5 units before each meal.   Continue taking Trulicity 4.5 mg weekly, metformin 1000 mg twice daily, Jardiance 25 mg daily.   Take care!  Catie Darnelle Maffucci, PharmD [phone]  Visit Information  Patient Care Plan: Medication Management    Problem Identified: Diabetes, Hypertension, CKD, Depression     Long-Range Goal: Disease Progression Prevention   This Visit's Progress: On track  Recent Progress: On track  Priority: High  Note:   Current Barriers:  . Unable to independently afford treatment regimen . Unable to independently monitor therapeutic efficacy . Unable to achieve control of diabetes   Pharmacist Clinical Goal(s):  Marland Kitchen Over the next 90 days, patient will verbalize ability to afford treatment regimen. . Over the next 90 days, patient will achieve control of diabetes as evidenced by improvement in A1c through collaboration with PharmD and provider.   Interventions: . 1:1 collaboration with Leone Haven, MD regarding development and update of comprehensive plan of care as evidenced by provider attestation and co-signature . Inter-disciplinary care team collaboration (see longitudinal plan of care) . Comprehensive medication review performed; medication list updated in electronic medical record  Diabetes: . Controlled per A1c but SMBG elevated; current treatment: metformin 1000 mg BID, Jardiance 25 mg daily, Trulicity 3 mg weekly, Basaglar 30 units daily, Novolog 5 units if glucose >200, glipizide 5 mg QPM. Per medication review, appears patient may not have been taking Basaglar.  . Current glucose readings:  Date of Download: 12/27/19-01/09/20 % Time CGM is active: 75% Average Glucose: 198 mg/dL Glucose Management Indicator: 8  Glucose Variability: 23 (goal <36%) Time in Goal:  - Time in range 70-180: 36% - Time above range:  64% - Time below range: 0% Observed patterns: elevated throughout the day with post-meal spikes  . Reviewed diabetes medications, specifically colors of pens of different injectables. Advised to take Basaglar 30 units daily, Trulicity 4.5 mg weekly, metformin 1000 mg BID (using extended release tablets), Jardiance 25 mg daily, and take Novolog 5 units with every meal.  Continue CGM and scanning at least three times daily. . Confirmed patient was re-enrolled for Novo and [employer] patient assistance.    Hypertension: . Controlled; current treatment: furosemide 20 mg QAM, losartan 100 mg QAM, metoprolol XL 100 mg QAM, amlodipine 10 mg daily QAM, doxazosin 4 mg QPM . Recommend to continue current regimen at this time  Hyperlipidemia: . Controlled; current treatment: atorvastatin 40 mg daily . Antiplatelet regimen: aspirin 325 mg daily (hx CVA per chart review) . Recommend to continue current regimen at this time.   Depression: . Controlled per patient report; current treatment: escitalopram 20 mg daily . Recommend to continue current regimen at this time   Patient Goals/Self-Care Activities . Over the next 90 days, patient will:  - take medications as prescribed check blood glucose TID, document, and provide at future appointments collaborate with provider on medication access solutions  Follow Up Plan: Face to Face appointment with care management team member scheduled for: ~ 3 weeks       Print copy of patient instructions, educational materials, and care plan provided in person.  Plan: Face to Face appointment with care management team member scheduled for: ~ 3 weeks  Catie Darnelle Maffucci, PharmD, [place], [place] Clinical Pharmacist [employer] at [employer] [phone]

## 2020-01-12 NOTE — Chronic Care Management (AMB) (Cosign Needed)
Chronic Care Management Pharmacy Note  01/12/2020 Name:  Shane Jones MRN:  [id number] DOB:  Dec 22, 1945  Subjective: Shane Jones is an 75 y.o. year old male who is a primary patient of Caryl Bis, Angela Adam, MD.  The CCM team was consulted for assistance with disease management and care coordination needs.    Engaged with patient face to face for follow up visit in response to provider referral for pharmacy case management and/or care coordination services.   Consent to Services:  The patient was given information about Chronic Care Management services, agreed to services, and gave verbal consent prior to initiation of services.  Please see initial visit note for detailed documentation.   Objective:  Lab Results  Component Value Date   CREATININE 1.81 (H) 09/01/2019   CREATININE 1.85 (H) 01/14/2019   CREATININE 1.31 12/26/2018    Lab Results  Component Value Date   HGBA1C 6.6 (A) 12/10/2019       Component Value Date/Time   CHOL 194 09/01/2019 1545   TRIG 388.0 (H) 09/01/2019 1545   HDL 35.80 (L) 09/01/2019 1545   CHOLHDL 5 09/01/2019 1545   VLDL 77.6 (H) 09/01/2019 1545   LDLCALC 23 08/07/2016 1156   LDLDIRECT 103.0 09/01/2019 1545     BP Readings from Last 3 Encounters:  12/10/19 118/70  09/01/19 120/80  05/22/19 106/72    Assessment/Interventions: Review of patient past medical history, allergies, medications, health status, including review of consultants reports, laboratory and other test data, was performed as part of comprehensive evaluation and provision of chronic care management services.   SDOH:  (Social Determinants of Health) assessments and interventions performed:    CCM Care Plan  Allergies  Allergen Reactions  . Uloric [Febuxostat] Hives    hives    Medications Reviewed Today    Reviewed by De Hollingshead, RPH-CPP (Pharmacist) on 01/12/20 at [place] List Status: <None>  Medication Order Taking? Sig Documenting Provider Last Dose  Status Informant  Accu-Chek FastClix Lancets MISC [id number]  USE UP TO FOUR TIMES DAILY AS DIRECTED Leone Haven, MD  Active   ACCU-CHEK GUIDE test strip [id number]  USE UP TO FOUR TIMES DAILY AS DIRECTED Leone Haven, MD  Active   allopurinol (ZYLOPRIM) 100 MG tablet [id number]  TAKE 1 TABLET DAILY Leone Haven, MD  Active   amLODipine (NORVASC) 10 MG tablet [id number]  TAKE 1 TABLET EVERY DAY Leone Haven, MD  Active   aspirin 325 MG tablet [id number]  Take 325 mg by mouth daily. [provider]  Active   atorvastatin (LIPITOR) 40 MG tablet [id number]  TAKE 1 TABLET EVERY DAY Leone Haven, MD  Active   B Complex Vitamins (VITAMIN-B COMPLEX PO) [id number]  Take by mouth. [provider]  Active   BETA CAROTENE PO [id number]  Take by mouth. [provider]  Active   blood glucose meter kit and supplies [id number]  Dispense based on patient and insurance preference. Use up to four times daily as directed. (FOR ICD-10 E10.9, E11.9). Leone Haven, MD  Active   Calcium Carbonate-Vitamin D (CALCIUM + D PO) [id number]  Take by mouth. [provider]  Active   doxazosin (CARDURA) 4 MG tablet [id number] Yes  [provider] Taking Active   Dulaglutide (TRULICITY) 4.5 KR/8.3KF SOPN [id number] Yes Inject 4.5 mg as directed once a week. Leone Haven, MD Taking Active   empagliflozin (JARDIANCE) 25 MG TABS tablet [id number] Yes Take 25 mg  by mouth daily. Leone Haven, MD Taking Active   escitalopram (LEXAPRO) 20 MG tablet [id number]  TAKE 1 TABLET (20 MG) BY MOUTH DAILY Leone Haven, MD  Active   furosemide (LASIX) 20 MG tablet [id number]  Take one tablet (20 mg total) by mouth daily. Leone Haven, MD  Active   gabapentin (NEURONTIN) 400 MG capsule [id number]  Take 1 capsule (400 mg total) by mouth 3 (three) times daily. Leone Haven, MD  Active   glipiZIDE (GLUCOTROL) 5 MG tablet [id number] Yes Take 1 tablet (5 mg  total) by mouth 2 (two) times daily before a meal. Leone Haven, MD Taking Active            Med Note [name] May 22, 2019 10:39 AM) Taking QAM   hydrALAZINE (APRESOLINE) 10 MG tablet [id number]  TAKE 1 TABLET BY MOUTH UP TO TWICE DAILY AS NEEDED IF BLOOD PRESSURE> 130/80 Leone Haven, MD  Active   Insulin Glargine (BASAGLAR KWIKPEN) 100 UNIT/ML [id number]  Inject 30 Units into the skin daily. Leone Haven, MD  Active   insulin lispro [facility]) 100 UNIT/ML KwikPen [id number] Yes Inject 5 units up to TID if pre meal sugar >200 Leone Haven, MD Taking Active   Insulin Pen Needle (PEN NEEDLES) 32G X 4 MM MISC [id number]  Use to inject insulin up to 4 times daily Leone Haven, MD  Active   losartan (COZAAR) 100 MG tablet [id number]  TAKE 1 TABLET EVERY DAY Leone Haven, MD  Active   magnesium 30 MG tablet [id number]  Take 30 mg by mouth 2 (two) times daily. [provider]  Active   metFORMIN (GLUCOPHAGE XR) 500 MG 24 hr tablet [id number]  Take 2 tablets (1,000 mg total) by mouth in the morning and at bedtime. Leone Haven, MD  Active   metoprolol succinate (TOPROL-XL) 100 MG 24 hr tablet [id number]  TAKE 1 TABLET EVERY DAY Leone Haven, MD  Active   Multiple Vitamin (MULTIVITAMIN) tablet [id number]  Take 1 tablet by mouth daily. [provider]  Active   pantoprazole (PROTONIX) 40 MG tablet [id number]  Take 1 tablet (40 mg total) by mouth daily. Leone Haven, MD  Active   potassium chloride (KLOR-CON) 10 MEQ tablet [id number]  TAKE 1 TABLET BY MOUTH DAILY WITH LASIX Leone Haven, MD  Active   Zinc Sulfate (ZINC 15 PO) [id number]  Take by mouth. [provider]  Active           Patient Active Problem List   Diagnosis Date Noted  . Overweight 05/30/2019  . Acute kidney failure ([place]) 01/22/2019  . Benign hypertensive kidney disease with chronic kidney disease 01/22/2019  . Secondary  hyperparathyroidism of renal origin ([place]) 01/22/2019  . Stage 3a chronic kidney disease ([place]) 01/22/2019  . Lightheadedness 12/19/2018  . Diarrhea 01/21/2018  . Skin lesions 01/21/2018  . Esophagitis   . Proteinuria 10/10/2017  . Onychomycosis 03/19/2017  . Anxiety and depression 09/19/2016  . Hypokalemia 09/19/2016  . Loss of height 05/01/2016  . Left shoulder pain 01/31/2016  . Prostate cancer screening 09/27/2012  . Sleep apnea 11/22/2011  . Chronic back pain 08/17/2011  . Hyperlipidemia 08/17/2011  . Gout 08/17/2011  . Type 2 diabetes, uncontrolled, with neuropathy ([place]) 02/20/2011  . Neuropathy ([place]) 02/17/2011  . Hypertension 10/13/2010    Conditions to be addressed/monitored:  HTN, HLD and DMII  Patient Care Plan: Medication Management  Problem Identified: Diabetes, Hypertension, CKD, Depression     Long-Range Goal: Disease Progression Prevention   This Visit's Progress: On track  Recent Progress: On track  Priority: High  Note:   Current Barriers:  . Unable to independently afford treatment regimen . Unable to independently monitor therapeutic efficacy . Unable to achieve control of diabetes   Pharmacist Clinical Goal(s):  Marland Kitchen Over the next 90 days, patient will verbalize ability to afford treatment regimen. . Over the next 90 days, patient will achieve control of diabetes as evidenced by improvement in A1c through collaboration with PharmD and provider.   Interventions: . 1:1 collaboration with Leone Haven, MD regarding development and update of comprehensive plan of care as evidenced by provider attestation and co-signature . Inter-disciplinary care team collaboration (see longitudinal plan of care) . Comprehensive medication review performed; medication list updated in electronic medical record  Diabetes: . Controlled per A1c but SMBG elevated; current treatment: metformin 1000 mg BID, Jardiance 25 mg daily, Trulicity 3 mg weekly, Basaglar 30 units  daily, Novolog 5 units if glucose >200, glipizide 5 mg QPM. Per medication review, appears patient may not have been taking Basaglar.  . Current glucose readings:  Date of Download: 12/27/19-01/09/20 % Time CGM is active: 75% Average Glucose: 198 mg/dL Glucose Management Indicator: 8  Glucose Variability: 23 (goal <36%) Time in Goal:  - Time in range 70-180: 36% - Time above range: 64% - Time below range: 0% Observed patterns: elevated throughout the day with post-meal spikes  . Reviewed diabetes medications, specifically colors of pens of different injectables. Advised to take Basaglar 30 units daily, Trulicity 4.5 mg weekly, metformin 1000 mg BID (using extended release tablets), Jardiance 25 mg daily, and take Novolog 5 units with every meal.  Continue CGM and scanning at least three times daily. . Confirmed patient was re-enrolled for Novo and BI patient assistance.    Hypertension: . Controlled; current treatment: furosemide 20 mg QAM, losartan 100 mg QAM, metoprolol XL 100 mg QAM, amlodipine 10 mg daily QAM, doxazosin 4 mg QPM . Recommend to continue current regimen at this time  Hyperlipidemia: . Controlled; current treatment: atorvastatin 40 mg daily . Antiplatelet regimen: aspirin 325 mg daily (hx CVA per chart review) . Recommend to continue current regimen at this time.   Depression: . Controlled per patient report; current treatment: escitalopram 20 mg daily . Recommend to continue current regimen at this time   Patient Goals/Self-Care Activities . Over the next 90 days, patient will:  - take medications as prescribed check blood glucose TID, document, and provide at future appointments collaborate with provider on medication access solutions  Follow Up Plan: Face to Face appointment with care management team member scheduled for: ~ 3 weeks       Medication Assistance: Jardiance obtained through [facility] medication assistance program. Enrollment ends 01/01/21. Trulicity,  Engineer, agricultural, Humalog obtained through [employer] medication assistance program. Enrollment ends 01/01/21.   Follow Up:  Patient agrees to Care Plan and Follow-up.  Plan: Face to Face appointment with care management team member scheduled for: ~ 3 weeks  Catie Darnelle Maffucci, PharmD, [place], [place] Clinical Pharmacist [employer] at [employer] [phone]

## 2020-01-13 ENCOUNTER — Telehealth: Payer: Self-pay | Admitting: Family Medicine

## 2020-01-13 NOTE — Telephone Encounter (Signed)
Shane Jones 01/13/2020 Called pt regarding community resource referral received. Left message for pt to call me back, my info is [phone] please see ref notes for more details.  [place], Care Management

## 2020-01-23 ENCOUNTER — Telehealth: Payer: Self-pay | Admitting: Family Medicine

## 2020-01-23 NOTE — Telephone Encounter (Signed)
   Telephone encounter was:  Successful.  01/23/2020 Name: [name] MRN: [id number] DOB: [birth year]  Shane Jones is a 75 y.o. year old male who is a primary care patient of Caryl Bis, Angela Adam, MD . The community resource team was consulted for assistance with Financial Difficulties related to his gas bill and needing to connect with his [place] benefits.   Care guide performed the following interventions: Investigation of community resources performed Discussed resources to assist with letting patient know that the [facility] has sent a check to his gas company for $500 to pay towards his gas bill. Patient was very happy with that news.  Placed referral to [name] and the [place] so that they will reach out to patient and help him get all and every benefit for him as a Vet. They will help him with all of his needs moving forward. They will even help him with programs like Medicaid if the [place] doesn't have the resources for him.  via online referral.  Follow up call placed to the patient to discuss status of referral.  Follow Up Plan:  Client will answer the phone in the next week for all calls to make sure that he connects with the coalition and No further follow up planned at this time. The patient has been provided with needed resources.  [place], Care Management Phone: [phone] Email: [email or web address]

## 2020-01-26 ENCOUNTER — Other Ambulatory Visit: Payer: Self-pay

## 2020-01-26 ENCOUNTER — Ambulatory Visit (INDEPENDENT_AMBULATORY_CARE_PROVIDER_SITE_OTHER): Payer: Medicare Other

## 2020-01-26 DIAGNOSIS — E1140 Type 2 diabetes mellitus with diabetic neuropathy, unspecified: Secondary | ICD-10-CM

## 2020-01-26 DIAGNOSIS — IMO0002 Reserved for concepts with insufficient information to code with codable children: Secondary | ICD-10-CM

## 2020-01-26 DIAGNOSIS — E1165 Type 2 diabetes mellitus with hyperglycemia: Secondary | ICD-10-CM

## 2020-01-26 NOTE — Progress Notes (Signed)
Patient presented for [facility] placement.Instructed patient that if further questions, they can call clinic to be given further direction.

## 2020-02-02 ENCOUNTER — Ambulatory Visit: Payer: Medicare Other | Admitting: Pharmacist

## 2020-02-02 DIAGNOSIS — E1140 Type 2 diabetes mellitus with diabetic neuropathy, unspecified: Secondary | ICD-10-CM

## 2020-02-02 DIAGNOSIS — IMO0002 Reserved for concepts with insufficient information to code with codable children: Secondary | ICD-10-CM

## 2020-02-02 DIAGNOSIS — I10 Essential (primary) hypertension: Secondary | ICD-10-CM

## 2020-02-02 DIAGNOSIS — E785 Hyperlipidemia, unspecified: Secondary | ICD-10-CM

## 2020-02-02 NOTE — Chronic Care Management (AMB) (Signed)
Chronic Care Management Pharmacy Note  02/02/2020 Name:  Shane Jones MRN:  [id number] DOB:  [birth year]  Subjective: Shane Jones is an 75 y.o. year old male who is a primary patient of Caryl Bis, Angela Adam, MD.  The CCM team was consulted for assistance with disease management and care coordination needs.    Engaged with patient by telephone for follow up visit in response to provider referral for pharmacy case management and/or care coordination services.   Consent to Services:  The patient was given information about Chronic Care Management services, agreed to services, and gave verbal consent prior to initiation of services.  Please see initial visit note for detailed documentation.   Objective:  Lab Results  Component Value Date   CREATININE 1.81 (H) 09/01/2019   CREATININE 1.85 (H) 01/14/2019   CREATININE 1.31 12/26/2018    Lab Results  Component Value Date   HGBA1C 6.6 (A) 12/10/2019       Component Value Date/Time   CHOL 194 09/01/2019 1545   TRIG 388.0 (H) 09/01/2019 1545   HDL 35.80 (L) 09/01/2019 1545   CHOLHDL 5 09/01/2019 1545   VLDL 77.6 (H) 09/01/2019 1545   LDLCALC 23 08/07/2016 1156   LDLDIRECT 103.0 09/01/2019 1545    Clinical ASCVD: Yes  The 10-year ASCVD risk score Mikey Bussing DC Jr., et al., 2013) is: 48.3%   Values used to calculate the score:     Age: 13 years     Sex: Male     Is Non-Hispanic African American: No     Diabetic: Yes     Tobacco smoker: No     Systolic Blood Pressure: 409 mmHg     Is BP treated: Yes     HDL Cholesterol: 35.8 mg/dL     Total Cholesterol: 194 mg/dL     BP Readings from Last 3 Encounters:  12/10/19 118/70  09/01/19 120/80  05/22/19 106/72    Assessment: Review of patient past medical history, allergies, medications, health status, including review of consultants reports, laboratory and other test data, was performed as part of comprehensive evaluation and provision of chronic care management services.   SDOH:   (Social Determinants of Health) assessments and interventions performed:    CCM Care Plan  Allergies  Allergen Reactions  . Uloric [Febuxostat] Hives    hives    Medications Reviewed Today    Reviewed by De Hollingshead, RPH-CPP (Pharmacist) on 02/02/20 at 1509  Med List Status: <None>  Medication Order Taking? Sig Documenting Provider Last Dose Status Informant  Accu-Chek FastClix Lancets MISC [id number]  USE UP TO FOUR TIMES DAILY AS DIRECTED Leone Haven, MD  Active   ACCU-CHEK GUIDE test strip [id number]  USE UP TO FOUR TIMES DAILY AS DIRECTED Leone Haven, MD  Active   allopurinol (ZYLOPRIM) 100 MG tablet [id number] Yes TAKE 1 TABLET DAILY Leone Haven, MD Taking Active   amLODipine (NORVASC) 10 MG tablet [id number] Yes TAKE 1 TABLET EVERY DAY Leone Haven, MD Taking Active   aspirin 325 MG tablet [id number] Yes Take 325 mg by mouth daily. [provider] Taking Active   atorvastatin (LIPITOR) 40 MG tablet [id number] Yes TAKE 1 TABLET EVERY DAY Leone Haven, MD Taking Active   B Complex Vitamins (VITAMIN-B COMPLEX PO) [id number] Yes Take by mouth. [provider] Taking Active   BETA CAROTENE PO [id number] Yes Take by mouth. [provider] Taking Active   blood glucose meter kit and supplies [id number]  Dispense based on patient and insurance preference. Use up to four times daily as directed. (FOR ICD-10 E10.9, E11.9). Leone Haven, MD  Active   Calcium Carbonate-Vitamin D (CALCIUM + D PO) [id number] Yes Take by mouth. [provider] Taking Active   doxazosin (CARDURA) 4 MG tablet [id number] Yes  [provider] Taking Active   Dulaglutide (TRULICITY) 4.5 BO/1.7PZ SOPN [id number] Yes Inject 4.5 mg as directed once a week. Leone Haven, MD Taking Active   empagliflozin (JARDIANCE) 25 MG TABS tablet [id number]  Take 25 mg by mouth daily. Leone Haven, MD  Active   escitalopram (LEXAPRO) 20 MG tablet  [id number] Yes TAKE 1 TABLET (20 MG) BY MOUTH DAILY Leone Haven, MD Taking Active   furosemide (LASIX) 20 MG tablet [id number] Yes Take one tablet (20 mg total) by mouth daily. Leone Haven, MD Taking Active   gabapentin (NEURONTIN) 400 MG capsule [id number] Yes Take 1 capsule (400 mg total) by mouth 3 (three) times daily. Leone Haven, MD Taking Active   glipiZIDE (GLUCOTROL) 5 MG tablet [id number]  Take 1 tablet (5 mg total) by mouth 2 (two) times daily before a meal. Leone Haven, MD  Active            Med Note [name] May 22, 2019 10:39 AM) Taking QAM   hydrALAZINE (APRESOLINE) 10 MG tablet [id number] Yes TAKE 1 TABLET BY MOUTH UP TO TWICE DAILY AS NEEDED IF BLOOD PRESSURE> 130/80 Leone Haven, MD Taking Active   Insulin Glargine [facility] KWIKPEN) 100 UNIT/ML [id number] Yes Inject 30 Units into the skin daily. Leone Haven, MD Taking Active   insulin lispro [facility]) 100 UNIT/ML KwikPen [id number] Yes Inject 5 units up to TID if pre meal sugar >200 Leone Haven, MD Taking Active            Med Note Mayo Ao Feb 02, 2020  2:58 PM) Normally used 2x/day  Insulin Pen Needle (PEN NEEDLES) 32G X 4 MM MISC [id number]  Use to inject insulin up to 4 times daily Leone Haven, MD  Active   losartan (COZAAR) 100 MG tablet [id number] Yes TAKE 1 TABLET EVERY DAY Leone Haven, MD Taking Active   magnesium 30 MG tablet [id number] Yes Take 30 mg by mouth 2 (two) times daily. [provider] Taking Active   metFORMIN (GLUCOPHAGE XR) 500 MG 24 hr tablet [id number]  Take 2 tablets (1,000 mg total) by mouth in the morning and at bedtime. Leone Haven, MD  Active   metoprolol succinate (TOPROL-XL) 100 MG 24 hr tablet [id number] Yes TAKE 1 TABLET EVERY DAY Leone Haven, MD Taking Active   Multiple Vitamin (MULTIVITAMIN) tablet [id number] Yes Take 1 tablet by mouth daily. [provider] Taking Active    pantoprazole (PROTONIX) 40 MG tablet [id number] Yes Take 1 tablet (40 mg total) by mouth daily. Leone Haven, MD Taking Active   potassium chloride (KLOR-CON) 10 MEQ tablet [id number] Yes TAKE 1 TABLET BY MOUTH DAILY WITH LASIX Leone Haven, MD Taking Active   Zinc Sulfate (ZINC 15 PO) [id number] Yes Take by mouth. [provider] Taking Active           Patient Active Problem List   Diagnosis Date Noted  . Overweight 05/30/2019  . Acute kidney failure ([place]) 01/22/2019  . Benign hypertensive kidney disease with chronic kidney disease 01/22/2019  . Secondary hyperparathyroidism of  renal origin ([place]) 01/22/2019  . Stage 3a chronic kidney disease ([place]) 01/22/2019  . Lightheadedness 12/19/2018  . Diarrhea 01/21/2018  . Skin lesions 01/21/2018  . Esophagitis   . Proteinuria 10/10/2017  . Onychomycosis 03/19/2017  . Anxiety and depression 09/19/2016  . Hypokalemia 09/19/2016  . Loss of height 05/01/2016  . Left shoulder pain 01/31/2016  . Prostate cancer screening 09/27/2012  . Sleep apnea 11/22/2011  . Chronic back pain 08/17/2011  . Hyperlipidemia 08/17/2011  . Gout 08/17/2011  . Type 2 diabetes, uncontrolled, with neuropathy ([place]) 02/20/2011  . Neuropathy ([place]) 02/17/2011  . Hypertension 10/13/2010    Conditions to be addressed/monitored: HTN, HLD and DMII  Care Plan : Medication Management  Updates made by De Hollingshead, RPH-CPP since 02/02/2020 12:00 AM    Problem: Diabetes, Hypertension, CKD, Depression     Long-Range Goal: Disease Progression Prevention   Recent Progress: On track  Priority: High  Note:   Current Barriers:  . Unable to independently afford treatment regimen . Unable to independently monitor therapeutic efficacy . Unable to achieve control of diabetes   Pharmacist Clinical Goal(s):  Marland Kitchen Over the next 90 days, patient will verbalize ability to afford treatment regimen. . Over the next 90 days, patient will achieve control of  diabetes as evidenced by improvement in A1c through collaboration with PharmD and provider.   Interventions: . 1:1 collaboration with Leone Haven, MD regarding development and update of comprehensive plan of care as evidenced by provider attestation and co-signature . Inter-disciplinary care team collaboration (see longitudinal plan of care) . Comprehensive medication review performed; medication list updated in electronic medical record  Diabetes: . Controlled per A1c but SMBG continues to be elevated; current treatment: metformin 1000 mg BID, Jardiance 25 mg daily, Trulicity 4.5 mg weekly, Basaglar 30 units daily, Humalog 5 units TID w/meals, glipizide 5 mg QPM.  . Per medication review, patient continues to miss 1-2 days of Basaglar per week. Additionally, patient reports increased stressed with [employer] bill and spectrum phone issues. . Current glucose readings:  Date of Download: 01/19/20 - 02/01/20 % Time CGM is active: 71% Average Glucose: 290 mg/dL Glucose Management Indicator: 10.2 Glucose Variability: 19.2 (goal <36%) Time in Goal:  - Time in range 70-180: 0% - Time above range: 100% - Time below range: 0% Observed patterns: elevated throughout the day secondary to Basaglar non-adherence and increased stressed.  . Discussed importance of medication adherence to reduce elevated sugars. Advised setting alarms for medication reminders. Continue Basaglar 30 units daily, Trulicity 4.5 mg weekly, metformin 1000 mg BID (using extended release tablets), Jardiance 25 mg daily, and Humalog 5 units TID with meals.  Continue CGM and scanning at least three times daily. . Additionally, patient is a good candidate for CGM device as he administers multiple daily insulin injections (4x/day). Will send PA for approval for Libre 2.0 CGM.  Marland Kitchen Confirmed patient was re-enrolled for Novo and BI patient assistance.   Hypertension: . Controlled; current treatment: furosemide 20 mg QAM, losartan 100 mg  QAM, metoprolol XL 100 mg QAM, amlodipine 10 mg daily QAM, doxazosin 4 mg QPM . Recommend to continue current regimen at this time  Hyperlipidemia: . Controlled; current treatment: atorvastatin 40 mg daily . Antiplatelet regimen: aspirin 325 mg daily (hx CVA per chart review) . Recommend to continue current regimen at this time.   Depression: . Controlled per patient report; current treatment: escitalopram 20 mg daily . Recommend to continue current regimen at this time   Patient  Goals/Self-Care Activities . Over the next 90 days, patient will:  - take medications as prescribed check blood glucose TID, document, and provide at future appointments collaborate with provider on medication access solutions  Follow Up Plan: Face to Face appointment with care management team member scheduled for: ~ 2 weeks         Medication Assistance: Trulicity, Humalog, Basaglarobtained through [employer] medication assistance program.  Enrollment ends 12/31/20  Follow Up:  Patient agrees to Care Plan and Follow-up.  Plan: Telephone follow up appointment with care management team member scheduled for:  ~2 weeks  Catie Darnelle Maffucci, PharmD, [place], [place] Clinical Pharmacist [employer] at [employer] [phone]

## 2020-02-02 NOTE — Patient Instructions (Signed)
Visit Information  PATIENT GOALS: Goals Addressed              This Visit's Progress     Patient Stated   .  Medication Monitoring (pt-stated)        Patient Goals/Self-Care Activities . Over the next 90 days, patient will:  - take medications as prescribed check blood glucose TID, document, and provide at future appointments collaborate with provider on medication access solutions        Patient verbalizes understanding of instructions provided today and agrees to view in [place].   Telephone follow up appointment with care management team member scheduled for: ~ 2 weeks  Catie Darnelle Maffucci, PharmD, [place], [place] Clinical Pharmacist [employer] at [employer] [phone]

## 2020-02-03 ENCOUNTER — Telehealth: Payer: Medicare Other

## 2020-02-10 ENCOUNTER — Telehealth: Payer: Self-pay

## 2020-02-10 ENCOUNTER — Ambulatory Visit (INDEPENDENT_AMBULATORY_CARE_PROVIDER_SITE_OTHER): Payer: Medicare Other

## 2020-02-10 VITALS — Ht 66.0 in | Wt 182.0 lb

## 2020-02-10 DIAGNOSIS — Z0000 Encounter for general adult medical examination without abnormal findings: Secondary | ICD-10-CM

## 2020-02-10 NOTE — Patient Instructions (Addendum)
Shane Jones , Thank you for taking time to come for your Medicare Wellness Visit. I appreciate your ongoing commitment to your health goals. Please review the following plan we discussed and let me know if I can assist you in the future.   These are the goals we discussed:  Goal: Follow up with pcp as needed. Stay active.    This is a list of the screening recommended for you and due dates:  Health Maintenance  Topic Date Due  . Eye exam for diabetics  02/10/2020*  . COVID-19 Vaccine (3 - Booster for Moderna series) 02/26/2020*  . Tetanus Vaccine  02/09/2021*  . Hemoglobin A1C  06/09/2020  . Complete foot exam   08/31/2020  . Flu Shot  Completed  .  Hepatitis C: One time screening is recommended by Center for Disease Control  (CDC) for  adults born from [birth year] through 1967.   Completed  . Pneumonia vaccines  Completed  *Topic was postponed. The date shown is not the original due date.    Immunizations Immunization History  Administered Date(s) Administered  . Fluad Quad(high Dose 65+) 09/25/2019  . Influenza Split 10/13/2010, 11/22/2011  . Influenza, High Dose Seasonal PF 11/01/2015, 10/30/2016, 10/10/2017  . Influenza,inj,Quad PF,6+ Mos 09/27/2012, 10/20/2013, 09/28/2014  . Moderna Sars-Covid-2 Vaccination 03/20/2019, 04/17/2019  . Pneumococcal Conjugate-13 04/14/2013  . Pneumococcal Polysaccharide-23 08/17/2011  . Tdap 08/16/2009   Keep all routine maintenance appointments.   CCM 02/16/20 @ 4:15  Follow up 03/10/20 @ 2:45  Advanced directives: not yet completed.   Conditions/risks identified: none new  Follow up in one year for your annual wellness visit.   Preventive Care 68 Years and Older, Male Preventive care refers to lifestyle choices and visits with your health care provider that can promote health and wellness. What does preventive care include?  A yearly physical exam. This is also called an annual well check.  Dental exams once or twice a year.  Routine  eye exams. Ask your health care provider how often you should have your eyes checked.  Personal lifestyle choices, including:  Daily care of your teeth and gums.  Regular physical activity.  Eating a healthy diet.  Avoiding tobacco and drug use.  Limiting alcohol use.  Practicing safe sex.  Taking low-dose aspirin every day.  Taking vitamin and mineral supplements as recommended by your health care provider. What happens during an annual well check? The services and screenings done by your health care provider during your annual well check will depend on your age, overall health, lifestyle risk factors, and family history of disease. Counseling  Your health care provider may ask you questions about your:  Alcohol use.  Tobacco use.  Drug use.  Emotional well-being.  Home and relationship well-being.  Sexual activity.  Eating habits.  History of falls.  Memory and ability to understand (cognition).  Work and work Statistician.  Reproductive health. Screening  You may have the following tests or measurements:  Height, weight, and BMI.  Blood pressure.  Lipid and cholesterol levels. These may be checked every 5 years, or more frequently if you are over 66 years old.  Skin check.  Lung cancer screening. You may have this screening every year starting at age 50 if you have a 30-pack-year history of smoking and currently smoke or have quit within the past 15 years.  Fecal occult blood test (FOBT) of the stool. You may have this test every year starting at age 58.  Flexible sigmoidoscopy or colonoscopy.  You may have a sigmoidoscopy every 5 years or a colonoscopy every 10 years starting at age 49.  Hepatitis C blood test.  Hepatitis B blood test.  Sexually transmitted disease (STD) testing.  Diabetes screening. This is done by checking your blood sugar (glucose) after you have not eaten for a while (fasting). You may have this done every 1-3 years.  Bone  density scan. This is done to screen for osteoporosis. You may have this done starting at age 70.  Mammogram. This may be done every 1-2 years. Talk to your health care provider about how often you should have regular mammograms. Talk with your health care provider about your test results, treatment options, and if necessary, the need for more tests. Vaccines  Your health care provider may recommend certain vaccines, such as:  Influenza vaccine. This is recommended every year.  Tetanus, diphtheria, and acellular pertussis (Tdap, Td) vaccine. You may need a Td booster every 10 years.  Zoster vaccine. You may need this after age 35.  Pneumococcal 13-valent conjugate (PCV13) vaccine. One dose is recommended after age 7.  Pneumococcal polysaccharide (PPSV23) vaccine. One dose is recommended after age 92. Talk to your health care provider about which screenings and vaccines you need and how often you need them. This information is not intended to replace advice given to you by your health care provider. Make sure you discuss any questions you have with your health care provider. Document Released: 01/15/2015 Document Revised: 09/08/2015 Document Reviewed: 10/20/2014 Elsevier Interactive Patient Education  2017 [place] Prevention in the Home Falls can cause injuries. They can happen to people of all ages. There are many things you can do to make your home safe and to help prevent falls. What can I do on the outside of my home?  Regularly fix the edges of walkways and driveways and fix any cracks.  Remove anything that might make you trip as you walk through a door, such as a raised step or threshold.  Trim any bushes or trees on the path to your home.  Use bright outdoor lighting.  Clear any walking paths of anything that might make someone trip, such as rocks or tools.  Regularly check to see if handrails are loose or broken. Make sure that both sides of any steps have  handrails.  Any raised decks and porches should have guardrails on the edges.  Have any leaves, snow, or ice cleared regularly.  Use sand or salt on walking paths during winter.  Clean up any spills in your garage right away. This includes oil or grease spills. What can I do in the bathroom?  Use night lights.  Install grab bars by the toilet and in the tub and shower. Do not use towel bars as grab bars.  Use non-skid mats or decals in the tub or shower.  If you need to sit down in the shower, use a plastic, non-slip stool.  Keep the floor dry. Clean up any water that spills on the floor as soon as it happens.  Remove soap buildup in the tub or shower regularly.  Attach bath mats securely with double-sided non-slip rug tape.  Do not have throw rugs and other things on the floor that can make you trip. What can I do in the bedroom?  Use night lights.  Make sure that you have a light by your bed that is easy to reach.  Do not use any sheets or blankets that are too big  for your bed. They should not hang down onto the floor.  Have a firm chair that has side arms. You can use this for support while you get dressed.  Do not have throw rugs and other things on the floor that can make you trip. What can I do in the kitchen?  Clean up any spills right away.  Avoid walking on wet floors.  Keep items that you use a lot in easy-to-reach places.  If you need to reach something above you, use a strong step stool that has a grab bar.  Keep electrical cords out of the way.  Do not use floor polish or wax that makes floors slippery. If you must use wax, use non-skid floor wax.  Do not have throw rugs and other things on the floor that can make you trip. What can I do with my stairs?  Do not leave any items on the stairs.  Make sure that there are handrails on both sides of the stairs and use them. Fix handrails that are broken or loose. Make sure that handrails are as long as  the stairways.  Check any carpeting to make sure that it is firmly attached to the stairs. Fix any carpet that is loose or worn.  Avoid having throw rugs at the top or bottom of the stairs. If you do have throw rugs, attach them to the floor with carpet tape.  Make sure that you have a light switch at the top of the stairs and the bottom of the stairs. If you do not have them, ask someone to add them for you. What else can I do to help prevent falls?  Wear shoes that:  Do not have high heels.  Have rubber bottoms.  Are comfortable and fit you well.  Are closed at the toe. Do not wear sandals.  If you use a stepladder:  Make sure that it is fully opened. Do not climb a closed stepladder.  Make sure that both sides of the stepladder are locked into place.  Ask someone to hold it for you, if possible.  Clearly mark and make sure that you can see:  Any grab bars or handrails.  First and last steps.  Where the edge of each step is.  Use tools that help you move around (mobility aids) if they are needed. These include:  Canes.  Walkers.  Scooters.  Crutches.  Turn on the lights when you go into a dark area. Replace any light bulbs as soon as they burn out.  Set up your furniture so you have a clear path. Avoid moving your furniture around.  If any of your floors are uneven, fix them.  If there are any pets around you, be aware of where they are.  Review your medicines with your doctor. Some medicines can make you feel dizzy. This can increase your chance of falling. Ask your doctor what other things that you can do to help prevent falls. This information is not intended to replace advice given to you by your health care provider. Make sure you discuss any questions you have with your health care provider. Document Released: 10/15/2008 Document Revised: 05/27/2015 Document Reviewed: 01/23/2014 Elsevier Interactive Patient Education  2017 [employer].

## 2020-02-10 NOTE — Progress Notes (Signed)
Subjective:   Shane Jones is a 75 y.o. male who presents for an Initial Medicare Annual Wellness Visit.  Review of Systems    No ROS.  Medicare Wellness Virtual Visit.     Cardiac Risk Factors include: advanced age (>26mn, >>96women);hypertension;diabetes mellitus     Objective:    Today's Vitals   02/10/20 1238  Weight: 182 lb (82.6 kg)  Height: 5' 6"  (1.676 m)   Body mass index is 29.38 kg/m.  Advanced Directives 02/10/2020 10/22/2017 10/16/2017  Does Patient Have a Medical Advance Directive? No No No  Would patient like information on creating a medical advance directive? No - Patient declined No - Patient declined No - Patient declined    Current Medications (verified) Outpatient Encounter Medications as of 02/10/2020  Medication Sig  . Accu-Chek FastClix Lancets MISC USE UP TO FOUR TIMES DAILY AS DIRECTED  . ACCU-CHEK GUIDE test strip USE UP TO FOUR TIMES DAILY AS DIRECTED  . allopurinol (ZYLOPRIM) 100 MG tablet TAKE 1 TABLET DAILY  . amLODipine (NORVASC) 10 MG tablet TAKE 1 TABLET EVERY DAY  . aspirin 325 MG tablet Take 325 mg by mouth daily.  .Marland Kitchenatorvastatin (LIPITOR) 40 MG tablet TAKE 1 TABLET EVERY DAY  . B Complex Vitamins (VITAMIN-B COMPLEX PO) Take by mouth.  .Marland KitchenBETA CAROTENE PO Take by mouth.  . blood glucose meter kit and supplies Dispense based on patient and insurance preference. Use up to four times daily as directed. (FOR ICD-10 E10.9, E11.9).  . Calcium Carbonate-Vitamin D (CALCIUM + D PO) Take by mouth.  . doxazosin (CARDURA) 4 MG tablet   . Dulaglutide (TRULICITY) 4.5 MKK/9.3GHSOPN Inject 4.5 mg as directed once a week.  . empagliflozin (JARDIANCE) 25 MG TABS tablet Take 25 mg by mouth daily.  .Marland Kitchenescitalopram (LEXAPRO) 20 MG tablet TAKE 1 TABLET (20 MG) BY MOUTH DAILY  . furosemide (LASIX) 20 MG tablet Take one tablet (20 mg total) by mouth daily.  .Marland Kitchengabapentin (NEURONTIN) 400 MG capsule Take 1 capsule (400 mg total) by mouth 3 (three) times daily.  .Marland Kitchen glipiZIDE (GLUCOTROL) 5 MG tablet Take 1 tablet (5 mg total) by mouth 2 (two) times daily before a meal.  . hydrALAZINE (APRESOLINE) 10 MG tablet TAKE 1 TABLET BY MOUTH UP TO TWICE DAILY AS NEEDED IF BLOOD PRESSURE> 130/80  . Insulin Glargine (BASAGLAR KWIKPEN) 100 UNIT/ML Inject 30 Units into the skin daily.  . insulin lispro (HUMALOG KWIKPEN) 100 UNIT/ML KwikPen Inject 5 units up to TID if pre meal sugar >200  . Insulin Pen Needle (PEN NEEDLES) 32G X 4 MM MISC Use to inject insulin up to 4 times daily  . losartan (COZAAR) 100 MG tablet TAKE 1 TABLET EVERY DAY  . magnesium 30 MG tablet Take 30 mg by mouth 2 (two) times daily.  . metFORMIN (GLUCOPHAGE XR) 500 MG 24 hr tablet Take 2 tablets (1,000 mg total) by mouth in the morning and at bedtime.  . metoprolol succinate (TOPROL-XL) 100 MG 24 hr tablet TAKE 1 TABLET EVERY DAY  . Multiple Vitamin (MULTIVITAMIN) tablet Take 1 tablet by mouth daily.  . pantoprazole (PROTONIX) 40 MG tablet Take 1 tablet (40 mg total) by mouth daily.  . potassium chloride (KLOR-CON) 10 MEQ tablet TAKE 1 TABLET BY MOUTH DAILY WITH LASIX  . Zinc Sulfate (ZINC 15 PO) Take by mouth.   No facility-administered encounter medications on file as of 02/10/2020.    Allergies (verified) Uloric [febuxostat]   History:  Past Medical History:  Diagnosis Date  . Diabetes mellitus without complication ([place])   . Gout   . Hypertension   . Sleep apnea    has CPAP, doesn't use  . Ulcer of esophagus without bleeding   . Wears dentures    full upper and lower   Past Surgical History:  Procedure Laterality Date  . BALLOON DILATION N/A 10/22/2017   Procedure: BALLOON DILATION;  Surgeon: Lucilla Lame, MD;  Location: [facility];  Service: Endoscopy;  Laterality: N/A;  . ESOPHAGOGASTRODUODENOSCOPY (EGD) WITH PROPOFOL N/A 10/22/2017   Procedure: ESOPHAGOGASTRODUODENOSCOPY (EGD) WITH Biopsy;  Surgeon: Lucilla Lame, MD;  Location: [facility];  Service: Endoscopy;   Laterality: N/A;  . TONSILLECTOMY     History reviewed. No pertinent family history. Social History   Socioeconomic History  . Marital status: Married    Spouse name: Not on file  . Number of children: Not on file  . Years of education: Not on file  . Highest education level: Not on file  Occupational History  . Not on file  Tobacco Use  . Smoking status: Former Smoker    Quit date: 11/20/1978    Years since quitting: 41.2  . Smokeless tobacco: Never Used  Vaping Use  . Vaping Use: Never used  Substance and Sexual Activity  . Alcohol use: Yes    Alcohol/week: 1.0 standard drink    Types: 1 Cans of beer per week    Comment: rare  . Drug use: No  . Sexual activity: Not on file  Other Topics Concern  . Not on file  Social History Narrative  . Not on file   Social Determinants of Health   Financial Resource Strain: Low Risk   . Difficulty of Paying Living Expenses: Not hard at all  Food Insecurity: No Food Insecurity  . Worried About Charity fundraiser in the Last Year: Never true  . Ran Out of Food in the Last Year: Never true  Transportation Needs: No Transportation Needs  . Lack of Transportation (Medical): No  . Lack of Transportation (Non-Medical): No  Physical Activity: Sufficiently Active  . Days of Exercise per Week: 3 days  . Minutes of Exercise per Session: 60 min  Stress: No Stress Concern Present  . Feeling of Stress : Not at all  Social Connections: Socially Isolated  . Frequency of Communication with Friends and Family: Once a week  . Frequency of Social Gatherings with Friends and Family: Once a week  . Attends Religious Services: Never  . Active Member of Clubs or Organizations: Yes  . Attends Archivist Meetings: Never  . Marital Status: Widowed    Tobacco Counseling Counseling given: Not Answered   Clinical Intake:  Pre-visit preparation completed: Yes        Diabetes: Yes (Followed pcp)  How often do you need to have  someone help you when you read instructions, pamphlets, or other written materials from your doctor or pharmacy?: 1 - Never  Nutrition Risk Assessment: Has the patient had any N/V/D within the last 2 weeks?  No  Does the patient have any non-healing wounds?  No  Has the patient had any unintentional weight loss or weight gain?  No   Diabetes Management: Is the patient seen by Chronic Care Management for management of their diabetes?  Yes    Interpreter Needed?: No     Activities of Daily Living In your present state of health, do you have any difficulty performing  the following activities: 02/10/2020  Hearing? N  Vision? N  Difficulty concentrating or making decisions? N  Walking or climbing stairs? N  Dressing or bathing? N  Doing errands, shopping? N  Preparing Food and eating ? N  Using the Toilet? N  In the past six months, have you accidently leaked urine? N  Do you have problems with loss of bowel control? N  Managing your Medications? N  Managing your Finances? N  Housekeeping or managing your Housekeeping? N  Some recent data might be hidden    Patient Care Team: Leone Haven, MD as PCP - General (Family Medicine) De Hollingshead, RPH-CPP as Pharmacist (Pharmacist)  Indicate any recent Medical Services you may have received from other than [facility] providers in the past year (date may be approximate).     Assessment:   This is a routine wellness examination for [place].  I connected with Amante today by telephone and verified that I am speaking with the correct person using two identifiers. Location patient: home Location provider: work Persons participating in the virtual visit: patient, Marine scientist.    I discussed the limitations, risks, security and privacy concerns of performing an evaluation and management service by telephone and the availability of in person appointments. The patient expressed understanding and verbally consented to this telephonic visit.     Interactive audio and video telecommunications were attempted between this provider and patient, however failed, due to patient having technical difficulties OR patient did not have access to video capability.  We continued and completed visit with audio only.  Some vital signs may be absent or patient reported.   Hearing/Vision screen  Hearing Screening   125Hz  250Hz  500Hz  1000Hz  2000Hz  3000Hz  4000Hz  6000Hz  8000Hz   Right ear:           Left ear:           Comments: Patient is able to hear conversational tones without difficulty.  No issues reported.   Vision Screening Comments: Wears corrective lenses Visual acuity not assessed, virtual visit.  They have seen their ophthalmologist in the last 12 months.     Dietary issues and exercise activities discussed: Current Exercise Habits: Home exercise routine, Time (Minutes): 60, Frequency (Times/Week): 3, Weekly Exercise (Minutes/Week): 180, Intensity: Mild  Diabetic diet Good water intake  Goal: Follow up with pcp as needed. Stay active.  Depression Screen PHQ 2/9 Scores 02/10/2020 12/10/2019 07/31/2019 05/30/2019 12/16/2018 09/16/2018 09/19/2016  PHQ - 2 Score 0 0 4 0 2 0 4  PHQ- 9 Score - - - - - - 15    Fall Risk Fall Risk  02/10/2020 12/10/2019 09/01/2019 05/30/2019 12/16/2018  Falls in the past year? 0 0 0 0 0  Number falls in past yr: 0 0 0 0 0  Injury with Fall? 0 0 - - -  Follow up Falls evaluation completed Falls evaluation completed Falls evaluation completed Falls evaluation completed Falls evaluation completed    [place]: Handrails in use when climbing stairs? Yes Home free of loose throw rugs in walkways, pet beds, electrical cords, etc? Yes  Adequate lighting in your home to reduce risk of falls? Yes   ASSISTIVE DEVICES UTILIZED TO PREVENT FALLS: Life alert? No  Use of a cane, walker or w/c? No   TIMED UP AND GO: Was the test performed? No . Virtual visit.   Cognitive Function:      6CIT Screen 02/10/2020  What Year? 0 points  What month?  0 points  What time? 0 points  Count back from 20 0 points  Months in reverse 0 points    Immunizations Immunization History  Administered Date(s) Administered  . Fluad Quad(high Dose 65+) 09/25/2019  . Influenza Split 10/13/2010, 11/22/2011  . Influenza, High Dose Seasonal PF 11/01/2015, 10/30/2016, 10/10/2017  . Influenza,inj,Quad PF,6+ Mos 09/27/2012, 10/20/2013, 09/28/2014  . Moderna Sars-Covid-2 Vaccination 03/20/2019, 04/17/2019  . Pneumococcal Conjugate-13 04/14/2013  . Pneumococcal Polysaccharide-23 08/17/2011  . Tdap 08/16/2009   Health Maintenance Health Maintenance  Topic Date Due  . OPHTHALMOLOGY EXAM  02/10/2020 (Originally 04/17/2018)  . COVID-19 Vaccine (3 - Booster for Moderna series) 02/26/2020 (Originally 10/17/2019)  . TETANUS/TDAP  02/09/2021 (Originally 08/17/2019)  . HEMOGLOBIN A1C  06/09/2020  . FOOT EXAM  08/31/2020  . INFLUENZA VACCINE  Completed  . Hepatitis C Screening  Completed  . PNA vac Low Risk Adult  Completed   Lung Cancer Screening: (Low Dose CT Chest recommended if Age 12-80 years, 30 pack-year currently smoking OR have quit w/in 15years.) does not qualify.   Hepatitis C Screening: Completed 01/21/18.   Vision Screening: Recommended annual ophthalmology exams for early detection of glaucoma and other disorders of the eye. Is the patient up to date with their annual eye exam?  Yes   Dental Screening: Recommended annual dental exams for proper oral hygiene.  Community Resource Referral / Chronic Care Management: CRR required this visit?  No   CCM required this visit?  No      Plan:   Keep all routine maintenance appointments.   CCM 02/16/20 @ 4:15  Follow up 03/10/20 @ 2:45  I have personally reviewed and noted the following in the patient's chart:   . Medical and social history . Use of alcohol, tobacco or illicit drugs  . Current medications and  supplements . Functional ability and status . Nutritional status . Physical activity . Advanced directives . List of other physicians . Hospitalizations, surgeries, and ER visits in previous 12 months . Vitals . Screenings to include cognitive, depression, and falls . Referrals and appointments  In addition, I have reviewed and discussed with patient certain preventive protocols, quality metrics, and best practice recommendations. A written personalized care plan for preventive services as well as general preventive health recommendations were provided to patient via mail.     Varney Biles, LPN   08/09/1834

## 2020-02-10 NOTE — Telephone Encounter (Signed)
error 

## 2020-02-13 ENCOUNTER — Other Ambulatory Visit: Payer: Self-pay

## 2020-02-16 ENCOUNTER — Ambulatory Visit: Payer: Medicare Other

## 2020-02-16 ENCOUNTER — Other Ambulatory Visit: Payer: Self-pay

## 2020-02-20 ENCOUNTER — Ambulatory Visit (INDEPENDENT_AMBULATORY_CARE_PROVIDER_SITE_OTHER): Payer: Medicare Other | Admitting: Pharmacist

## 2020-02-20 ENCOUNTER — Other Ambulatory Visit: Payer: Self-pay

## 2020-02-20 DIAGNOSIS — IMO0002 Reserved for concepts with insufficient information to code with codable children: Secondary | ICD-10-CM

## 2020-02-20 DIAGNOSIS — E1165 Type 2 diabetes mellitus with hyperglycemia: Secondary | ICD-10-CM

## 2020-02-20 DIAGNOSIS — I129 Hypertensive chronic kidney disease with stage 1 through stage 4 chronic kidney disease, or unspecified chronic kidney disease: Secondary | ICD-10-CM | POA: Diagnosis not present

## 2020-02-20 DIAGNOSIS — I10 Essential (primary) hypertension: Secondary | ICD-10-CM

## 2020-02-20 DIAGNOSIS — F32A Depression, unspecified: Secondary | ICD-10-CM | POA: Diagnosis not present

## 2020-02-20 DIAGNOSIS — E1140 Type 2 diabetes mellitus with diabetic neuropathy, unspecified: Secondary | ICD-10-CM

## 2020-02-20 DIAGNOSIS — F419 Anxiety disorder, unspecified: Secondary | ICD-10-CM

## 2020-02-20 DIAGNOSIS — E785 Hyperlipidemia, unspecified: Secondary | ICD-10-CM

## 2020-02-20 NOTE — Progress Notes (Signed)
Chronic Care Management Pharmacy Note  02/20/2020 Name:  Shane Jones MRN:  [id number] DOB:  [birth year]  Subjective: Shane Jones is an 75 y.o. year old male who is a primary patient of Caryl Bis, Angela Adam, MD.  The CCM team was consulted for assistance with disease management and care coordination needs.    Engaged with patient face to face for follow up visit in response to provider referral for pharmacy case management and/or care coordination services.   Consent to Services:  The patient was given the following information about Chronic Care Management services today, agreed to services, and gave verbal consent: 1. CCM service includes personalized support from designated clinical staff supervised by the primary care provider, including individualized plan of care and coordination with other care providers 2. 24/7 contact phone numbers for assistance for urgent and routine care needs. 3. Service will only be billed when office clinical staff spend 20 minutes or more in a month to coordinate care. 4. Only one practitioner may furnish and bill the service in a calendar month. 5.The patient may stop CCM services at any time (effective at the end of the month) by phone call to the office staff. 6. The patient will be responsible for cost sharing (co-pay) of up to 20% of the service fee (after annual deductible is met). Patient agreed to services and consent obtained.  Patient Care Team: Leone Haven, MD as PCP - General (Family Medicine) De Hollingshead, RPH-CPP as Pharmacist (Pharmacist)   Hospital visits: None in previous 6 months  Objective:  Lab Results  Component Value Date   CREATININE 1.81 (H) 09/01/2019   BUN 19 09/01/2019   GFR 36.59 (L) 09/01/2019   NA 132 (L) 09/01/2019   K 3.7 09/01/2019   CALCIUM 9.9 09/01/2019   CO2 24 09/01/2019    Lab Results  Component Value Date/Time   HGBA1C 6.6 (A) 12/10/2019 04:17 PM   HGBA1C 13.2 (H) 09/01/2019 03:45 PM   HGBA1C  8.8 (A) 05/22/2019 11:40 AM   HGBA1C 9.8 (H) 01/14/2019 10:52 AM   GFR 36.59 (L) 09/01/2019 03:45 PM   GFR 35.74 (L) 01/14/2019 10:52 AM   MICROALBUR 21.2 (H) 09/01/2019 03:45 PM   MICROALBUR 25.5 (H) 07/27/2015 08:11 AM    Last diabetic Eye exam:  Lab Results  Component Value Date/Time   HMDIABEYEEXA No Retinopathy 04/16/2017 12:00 AM    Last diabetic Foot exam:  Lab Results  Component Value Date/Time   HMDIABFOOTEX Normal 07/27/2015 12:00 AM     Lab Results  Component Value Date   CHOL 194 09/01/2019   HDL 35.80 (L) 09/01/2019   LDLCALC 23 08/07/2016   LDLDIRECT 103.0 09/01/2019   TRIG 388.0 (H) 09/01/2019   CHOLHDL 5 09/01/2019    Hepatic Function Latest Ref Rng & Units 09/01/2019 12/26/2018 09/25/2018  Total Protein 6.0 - 8.3 g/dL 6.9 6.5 7.1  Albumin 3.5 - 5.2 g/dL 4.3 4.2 4.5  AST 0 - 37 U/L 13 20 18   ALT 0 - 53 U/L 11 17 19   Alk Phosphatase 39 - 117 U/L 75 68 67  Total Bilirubin 0.2 - 1.2 mg/dL 1.0 0.9 0.7    Lab Results  Component Value Date/Time   TSH 1.11 12/26/2018 09:30 AM   TSH 0.84 02/17/2011 08:30 AM    CBC Latest Ref Rng & Units 12/26/2018  WBC 4.0 - 10.5 K/uL 6.2  Hemoglobin 13.0 - 17.0 g/dL 16.2  Hematocrit 39.0 - 52.0 % 46.3  Platelets 150.0 -  400.0 K/uL 188.0    Depression screen [facility] 2/9 02/10/2020 12/10/2019 07/31/2019  Decreased Interest 0 0 2  Down, Depressed, Hopeless 0 0 2  PHQ - 2 Score 0 0 4  Altered sleeping - - -  Tired, decreased energy - - -  Change in appetite - - -  Feeling bad or failure about yourself  - - -  Trouble concentrating - - -  Moving slowly or fidgety/restless - - -  Suicidal thoughts - - -  PHQ-9 Score - - -     Social History   Tobacco Use  Smoking Status Former Smoker  . Quit date: 11/20/1978  . Years since quitting: 41.2  Smokeless Tobacco Never Used   BP Readings from Last 3 Encounters:  12/10/19 118/70  09/01/19 120/80  05/22/19 106/72   Pulse Readings from Last 3 Encounters:  12/10/19 69   09/01/19 90  05/22/19 (!) 57   Wt Readings from Last 3 Encounters:  02/10/20 182 lb (82.6 kg)  12/10/19 182 lb 8 oz (82.8 kg)  09/01/19 160 lb (72.6 kg)    Assessment/Interventions: Review of patient past medical history, allergies, medications, health status, including review of consultants reports, laboratory and other test data, was performed as part of comprehensive evaluation and provision of chronic care management services.   SDOH:  (Social Determinants of Health) assessments and interventions performed: Yes SDOH Interventions   Flowsheet Row Most Recent Value  SDOH Interventions   Financial Strain Interventions Intervention Not Indicated  Stress Interventions Provide Counseling      CCM Care Plan  Allergies  Allergen Reactions  . Uloric [Febuxostat] Hives    hives    Medications Reviewed Today    Reviewed by De Hollingshead, RPH-CPP (Pharmacist) on 02/20/20 at 1431  Med List Status: <None>  Medication Order Taking? Sig Documenting Provider Last Dose Status Informant  Accu-Chek FastClix Lancets MISC [id number]  USE UP TO FOUR TIMES DAILY AS DIRECTED Leone Haven, MD  Active   ACCU-CHEK GUIDE test strip [id number]  USE UP TO FOUR TIMES DAILY AS DIRECTED Leone Haven, MD  Active   allopurinol (ZYLOPRIM) 100 MG tablet [id number] Yes TAKE 1 TABLET DAILY Leone Haven, MD Taking Active   amLODipine (NORVASC) 10 MG tablet [id number] Yes TAKE 1 TABLET EVERY DAY Leone Haven, MD Taking Active   aspirin 325 MG tablet [id number] Yes Take 325 mg by mouth daily. [provider] Taking Active   atorvastatin (LIPITOR) 40 MG tablet [id number] Yes TAKE 1 TABLET EVERY DAY Leone Haven, MD Taking Active   B Complex Vitamins (VITAMIN-B COMPLEX PO) [id number] Yes Take by mouth. [provider] Taking Active   BETA CAROTENE PO [id number] Yes Take by mouth. [provider] Taking Active   blood glucose meter kit and supplies [id number] Yes  Dispense based on patient and insurance preference. Use up to four times daily as directed. (FOR ICD-10 E10.9, E11.9). Leone Haven, MD Taking Active   Calcium Carbonate-Vitamin D (CALCIUM + D PO) [id number] Yes Take by mouth. [provider] Taking Active   doxazosin (CARDURA) 4 MG tablet [id number] Yes  [provider] Taking Active   Dulaglutide (TRULICITY) 4.5 RK/2.7CW SOPN [id number] Yes Inject 4.5 mg as directed once a week. Leone Haven, MD Taking Active   empagliflozin (JARDIANCE) 25 MG TABS tablet [id number] Yes Take 25 mg by mouth daily. Leone Haven, MD Taking Active   escitalopram (LEXAPRO) 20 MG tablet [id number] Yes TAKE 1  TABLET (20 MG) BY MOUTH DAILY Leone Haven, MD Taking Active   furosemide (LASIX) 20 MG tablet [id number] Yes Take one tablet (20 mg total) by mouth daily. Leone Haven, MD Taking Active   gabapentin (NEURONTIN) 400 MG capsule [id number] Yes Take 1 capsule (400 mg total) by mouth 3 (three) times daily. Leone Haven, MD Taking Active   glipiZIDE (GLUCOTROL) 5 MG tablet [id number] Yes Take 1 tablet (5 mg total) by mouth 2 (two) times daily before a meal. Leone Haven, MD Taking Active            Med Note [name] May 22, 2019 10:39 AM) Taking QAM   hydrALAZINE (APRESOLINE) 10 MG tablet [id number]  TAKE 1 TABLET BY MOUTH UP TO TWICE DAILY AS NEEDED IF BLOOD PRESSURE> 130/80 Leone Haven, MD  Active   Insulin Glargine [facility] KWIKPEN) 100 UNIT/ML [id number] Yes Inject 30 Units into the skin daily. Leone Haven, MD Taking Active   insulin lispro [facility]) 100 UNIT/ML KwikPen [id number] Yes Inject 5 units up to TID if pre meal sugar >200 Leone Haven, MD Taking Active            Med Note Darnelle Maffucci, Arville Lime   Fri Feb 20, 2020  2:14 PM)    Insulin Pen Needle (PEN NEEDLES) 32G X 4 MM MISC [id number]  Use to inject insulin up to 4 times daily Leone Haven, MD  Active    losartan (COZAAR) 100 MG tablet [id number] Yes TAKE 1 TABLET EVERY DAY Leone Haven, MD Taking Active   magnesium 30 MG tablet [id number] Yes Take 30 mg by mouth 2 (two) times daily. [provider] Taking Active   metFORMIN (GLUCOPHAGE XR) 500 MG 24 hr tablet [id number] Yes Take 2 tablets (1,000 mg total) by mouth in the morning and at bedtime. Leone Haven, MD Taking Active   metoprolol succinate (TOPROL-XL) 100 MG 24 hr tablet [id number] Yes TAKE 1 TABLET EVERY DAY Leone Haven, MD Taking Active   Multiple Vitamin (MULTIVITAMIN) tablet [id number] Yes Take 1 tablet by mouth daily. [provider] Taking Active   pantoprazole (PROTONIX) 40 MG tablet [id number] Yes Take 1 tablet (40 mg total) by mouth daily. Leone Haven, MD Taking Active   potassium chloride (KLOR-CON) 10 MEQ tablet [id number] Yes TAKE 1 TABLET BY MOUTH DAILY WITH LASIX Leone Haven, MD Taking Active   Zinc Sulfate (ZINC 15 PO) [id number] Yes Take by mouth. [provider] Taking Active           Patient Active Problem List   Diagnosis Date Noted  . Overweight 05/30/2019  . Acute kidney failure ([place]) 01/22/2019  . Benign hypertensive kidney disease with chronic kidney disease 01/22/2019  . Secondary hyperparathyroidism of renal origin ([place]) 01/22/2019  . Stage 3a chronic kidney disease ([place]) 01/22/2019  . Lightheadedness 12/19/2018  . Diarrhea 01/21/2018  . Skin lesions 01/21/2018  . Esophagitis   . Proteinuria 10/10/2017  . Onychomycosis 03/19/2017  . Anxiety and depression 09/19/2016  . Hypokalemia 09/19/2016  . Loss of height 05/01/2016  . Left shoulder pain 01/31/2016  . Prostate cancer screening 09/27/2012  . Sleep apnea 11/22/2011  . Chronic back pain 08/17/2011  . Hyperlipidemia 08/17/2011  . Gout 08/17/2011  . Type 2 diabetes, uncontrolled, with neuropathy ([place]) 02/20/2011  . Neuropathy ([place]) 02/17/2011  . Hypertension 10/13/2010    Immunization  History  Administered Date(s) Administered  . Fluad  Quad(high Dose 65+) 09/25/2019  . Influenza Split 10/13/2010, 11/22/2011  . Influenza, High Dose Seasonal PF 11/01/2015, 10/30/2016, 10/10/2017  . Influenza,inj,Quad PF,6+ Mos 09/27/2012, 10/20/2013, 09/28/2014  . Moderna Sars-Covid-2 Vaccination 03/20/2019, 04/17/2019  . Pneumococcal Conjugate-13 04/14/2013  . Pneumococcal Polysaccharide-23 08/17/2011  . Tdap 08/16/2009    Conditions to be addressed/monitored:  Hypertension, Hyperlipidemia, Diabetes and Depression  Care Plan : Medication Management  Updates made by De Hollingshead, RPH-CPP since 02/20/2020 12:00 AM    Problem: Diabetes, Hypertension, CKD, Depression     Long-Range Goal: Disease Progression Prevention   Recent Progress: On track  Priority: High  Note:   Current Barriers:  . Unable to independently afford treatment regimen . Unable to independently monitor therapeutic efficacy . Unable to achieve control of diabetes   Pharmacist Clinical Goal(s):  Marland Kitchen Over the next 90 days, patient will verbalize ability to afford treatment regimen. . Over the next 90 days, patient will achieve control of diabetes as evidenced by improvement in A1c through collaboration with PharmD and provider.   Interventions: . 1:1 collaboration with Leone Haven, MD regarding development and update of comprehensive plan of care as evidenced by provider attestation and co-signature . Inter-disciplinary care team collaboration (see longitudinal plan of care) . Comprehensive medication review performed; medication list updated in electronic medical record  Health Maintenance: . Discussed benefit of RN CM support to help with disease management education, lifestyle modification, and care coordination support. Patient amenable. Will collaborate w/ RN CM to outreach patient in the next several weeks.  . Notes stress lately with getting things "straight" with bills, the bank, utilities.  Reports appreciation for support with paying utilities in the past few months.  Diabetes: . Controlled per A1c but SMBG continues to be elevated; current treatment: metformin XR 1000 mg BID, Jardiance 25 mg daily, Trulicity 4.5 mg weekly, Basaglar 30 units daily (prefers to split and take 15 units BID), Humalog 5-10 units TID w/meals, glipizide 5 mg QPM.  . Approved for patient assistance for Basaglar, Humalog, and Jardiance through 2022. Marland Kitchen Patient endorses a better focus on medication adherence over the past 2 weeks. Notes that he has been checking glucoses 2-3 times daily since the last CGM sensor fell off, but forgot to bring his papers with him today.  . Current glucose readings: reports fastings and post prandials remain 110-160s . Continue current regimen at this time. Given that he is injecting insulin 5 times daily and check glucose frequently and using to adjust regimen, he would benefit greatly from continued CGM use. Will submit order today.  . Discussed potential for increased glycemic benefit w/ Ozempic vs Trulicity, however, patient has significant Trulicity supply at home right now. Continue Trulicity 4.5 mg weekly at this time, will follow for supply moving forward.  . Continue metformin XR 1000 mg BID, Jardiance 25 mg daily (can consider consolidating to Synjardy XR moving forward to reduce pill burden), Basaglar 15 units BID, Humalog 5-10 units up to three times daily with meals.  . Will collaborate w/ RN CM for disease management education and lifestyle modification.   Hypertension: . Controlled; current treatment: furosemide 20 mg QAM, losartan 100 mg QAM, metoprolol XL 100 mg QAM, amlodipine 10 mg daily QAM, doxazosin 4 mg QPM . Home readings: reports readings 110-120s/70-80s when he checks at home. Elevated with stress.  . Recommend to continue current regimen at this time. Will collaborate w/ RN CM for disease management education and lifestyle modification.    Hyperlipidemia: . Controlled;  current treatment: atorvastatin 40 mg daily . Antiplatelet regimen: aspirin 325 mg daily (hx CVA per chart review) . Recommend to continue current regimen at this time.   Depression: . Controlled per patient report, though he endorses significant stress with home management activities (bills, phone services, notes his wife used to manage all of this); current treatment: escitalopram 20 mg daily . Recommend to continue current regimen at this time. Will follow for opportunities to suggest therapy support.   Patient Goals/Self-Care Activities . Over the next 90 days, patient will:  - take medications as prescribed check glucose three times daily using CGM, document, and provide at future appointments check blood pressure periodically, document, and provide at future appointments collaborate with provider on medication access solutions  Follow Up Plan: Telephone follow up appointment with care management team member scheduled for:~ 2 weeks per patient request         Medication Assistance: Jardiance obtained through [place] medication assistance program.  Enrollment ends 01/02/20. Basaglar, Humalog, Trulicity obtained through [employer] medication assistance program.  Enrollment ends 01/02/20  Patient's preferred pharmacy is:  [id number] [address], [place] [address] Phone: [phone] Fax: [phone]  [facility] [place], [place] [address] Phone: [phone] Fax: [phone]  Uses pill box? Yes   Care Plan and Follow Up Patient Decision:  Patient agrees to Care Plan and Follow-up.  Plan: Telephone follow up appointment with care management team member scheduled for:  ~ 2 weeks  Catie Darnelle Maffucci, PharmD, [place], [place] Clinical Pharmacist [employer] at [employer] [phone]

## 2020-02-20 NOTE — Patient Instructions (Addendum)
Visit Information  PATIENT GOALS: Goals Addressed              This Visit's Progress     Patient Stated   .  Medication Monitoring (pt-stated)        Patient Goals/Self-Care Activities . Over the next 90 days, patient will:  - take medications as prescribed check glucose three times daily using CGM, document, and provide at future appointments check blood pressure periodically, document, and provide at future appointments collaborate with provider on medication access solutions        The patient verbalized understanding of instructions, educational materials, and care plan provided today and declined offer to receive copy of patient instructions, educational materials, and care plan.    Plan: Telephone follow up appointment with care management team member scheduled for:  ~ 2 weeks  Catie Darnelle Maffucci, PharmD, [place], [place] Clinical Pharmacist [employer] at [employer] [phone]

## 2020-02-23 ENCOUNTER — Telehealth: Payer: Self-pay | Admitting: *Deleted

## 2020-02-23 NOTE — Chronic Care Management (AMB) (Signed)
  Chronic Care Management   Note  02/23/2020 Name: NOX TALENT MRN: [id number] DOB: 19-Nov-1945  DONYELL DING is a 75 y.o. year old male who is a primary care patient of Caryl Bis, Angela Adam, MD. DAISON BRAXTON is currently enrolled in care management services. An additional referral for RNCM was placed.   Follow up plan: Telephone appointment with care management team member scheduled for: 03/08/2020 [place] Management

## 2020-03-04 ENCOUNTER — Ambulatory Visit (INDEPENDENT_AMBULATORY_CARE_PROVIDER_SITE_OTHER): Payer: Medicare Other | Admitting: Pharmacist

## 2020-03-04 DIAGNOSIS — E1165 Type 2 diabetes mellitus with hyperglycemia: Secondary | ICD-10-CM

## 2020-03-04 DIAGNOSIS — I129 Hypertensive chronic kidney disease with stage 1 through stage 4 chronic kidney disease, or unspecified chronic kidney disease: Secondary | ICD-10-CM

## 2020-03-04 DIAGNOSIS — F32A Depression, unspecified: Secondary | ICD-10-CM

## 2020-03-04 DIAGNOSIS — F419 Anxiety disorder, unspecified: Secondary | ICD-10-CM

## 2020-03-04 DIAGNOSIS — IMO0002 Reserved for concepts with insufficient information to code with codable children: Secondary | ICD-10-CM

## 2020-03-04 DIAGNOSIS — E785 Hyperlipidemia, unspecified: Secondary | ICD-10-CM

## 2020-03-04 DIAGNOSIS — I10 Essential (primary) hypertension: Secondary | ICD-10-CM

## 2020-03-04 MED ORDER — INSULIN LISPRO (1 UNIT DIAL) 100 UNIT/ML (KWIKPEN): PEN_INJECTOR | SUBCUTANEOUS | 0 refills | Status: DC

## 2020-03-04 NOTE — Patient Instructions (Signed)
Visit Information  PATIENT GOALS: Goals Addressed              This Visit's Progress     Patient Stated   .  Medication Monitoring (pt-stated)        Patient Goals/Self-Care Activities . Over the next 90 days, patient will:  - take medications as prescribed check glucose three times daily using CGM, document, and provide at future appointments check blood pressure periodically, document, and provide at future appointments collaborate with provider on medication access solutions         The patient verbalized understanding of instructions, educational materials, and care plan provided today and declined offer to receive copy of patient instructions, educational materials, and care plan.   Plan: Telephone follow up appointment with care management team member scheduled for:  ~ 6 weeks  Catie Darnelle Maffucci, PharmD, [place], [place] Clinical Pharmacist [employer] at [employer] [phone]

## 2020-03-04 NOTE — Progress Notes (Signed)
Chronic Care Management Pharmacy Note  03/04/2020 Name:  Shane Jones MRN:  [id number] DOB:  Aug 17, 1945  Subjective: Shane Jones is an 75 y.o. year old male who is a primary patient of Caryl Bis, Angela Adam, MD.  The CCM team was consulted for assistance with disease management and care coordination needs.    Engaged with patient by telephone for follow up visit in response to provider referral for pharmacy case management and/or care coordination services.   Consent to Services:  The patient was given information about Chronic Care Management services, agreed to services, and gave verbal consent prior to initiation of services.  Please see initial visit note for detailed documentation.   Patient Care Team: Leone Haven, MD as PCP - General (Family Medicine) De Hollingshead, RPH-CPP as Pharmacist (Pharmacist) Leona Singleton, RN as [place] Management  Recent office visits: None since our last call  Recent consult visits: None since our last call  Hospital visits: None in previous 6 months  Objective:  Lab Results  Component Value Date   CREATININE 1.81 (H) 09/01/2019   BUN 19 09/01/2019   GFR 36.59 (L) 09/01/2019   NA 132 (L) 09/01/2019   K 3.7 09/01/2019   CALCIUM 9.9 09/01/2019   CO2 24 09/01/2019    Lab Results  Component Value Date/Time   HGBA1C 6.6 (A) 12/10/2019 04:17 PM   HGBA1C 13.2 (H) 09/01/2019 03:45 PM   HGBA1C 8.8 (A) 05/22/2019 11:40 AM   HGBA1C 9.8 (H) 01/14/2019 10:52 AM   GFR 36.59 (L) 09/01/2019 03:45 PM   GFR 35.74 (L) 01/14/2019 10:52 AM   MICROALBUR 21.2 (H) 09/01/2019 03:45 PM   MICROALBUR 25.5 (H) 07/27/2015 08:11 AM    Last diabetic Eye exam:  Lab Results  Component Value Date/Time   HMDIABEYEEXA No Retinopathy 04/16/2017 12:00 AM    Last diabetic Foot exam:  Lab Results  Component Value Date/Time   HMDIABFOOTEX Normal 07/27/2015 12:00 AM     Lab Results  Component Value Date   CHOL 194 09/01/2019    HDL 35.80 (L) 09/01/2019   LDLCALC 23 08/07/2016   LDLDIRECT 103.0 09/01/2019   TRIG 388.0 (H) 09/01/2019   CHOLHDL 5 09/01/2019    Hepatic Function Latest Ref Rng & Units 09/01/2019 12/26/2018 09/25/2018  Total Protein 6.0 - 8.3 g/dL 6.9 6.5 7.1  Albumin 3.5 - 5.2 g/dL 4.3 4.2 4.5  AST 0 - 37 U/L _0 ALT 0 - 53 U/L _1 Alk Phosphatase 39 - 117 U/L 75 68 67  Total Bilirubin 0.2 - 1.2 mg/dL 1.0 0.9 0.7    Lab Results  Component Value Date/Time   TSH 1.11 12/26/2018 09:30 AM   TSH 0.84 02/17/2011 08:30 AM    CBC Latest Ref Rng & Units 12/26/2018  WBC 4.0 - 10.5 K/uL 6.2  Hemoglobin 13.0 - 17.0 g/dL 16.2  Hematocrit 39.0 - 52.0 % 46.3  Platelets 150.0 - 400.0 K/uL 188.0    Clinical ASCVD: Yes   Depression screen [facility] 2/9 02/10/2020 12/10/2019 07/31/2019  Decreased Interest 0 0 2  Down, Depressed, Hopeless 0 0 2  PHQ - 2 Score 0 0 4  Altered sleeping - - -  Tired, decreased energy - - -  Change in appetite - - -  Feeling bad or failure about yourself  - - -  Trouble concentrating - - -  Moving slowly or fidgety/restless - - -  Suicidal thoughts - - -  PHQ-9 Score - - -     Social History   Tobacco Use  Smoking Status Former Smoker  . Quit date: 11/20/1978  . Years since quitting: 41.3  Smokeless Tobacco Never Used   BP Readings from Last 3 Encounters:  12/10/19 118/70  09/01/19 120/80  05/22/19 106/72   Pulse Readings from Last 3 Encounters:  12/10/19 69  09/01/19 90  05/22/19 (!) 57   Wt Readings from Last 3 Encounters:  02/10/20 182 lb (82.6 kg)  12/10/19 182 lb 8 oz (82.8 kg)  09/01/19 160 lb (72.6 kg)    Assessment/Interventions: Review of patient past medical history, allergies, medications, health status, including review of consultants reports, laboratory and other test data, was performed as part of comprehensive evaluation and provision of chronic care management services.   SDOH:  (Social Determinants of Health) assessments and  interventions performed: Yes SDOH Interventions   Flowsheet Row Most Recent Value  SDOH Interventions   Financial Strain Interventions Other (Comment)  [manufacturer assistance]      CCM Care Plan  Allergies  Allergen Reactions  . Uloric [Febuxostat] Hives    hives    Medications Reviewed Today    Reviewed by De Hollingshead, RPH-CPP (Pharmacist) on 03/04/20 at [place] List Status: <None>  Medication Order Taking? Sig Documenting Provider Last Dose Status Informant  Accu-Chek FastClix Lancets MISC [id number] Yes USE UP TO FOUR TIMES DAILY AS DIRECTED Leone Haven, MD Taking Active   ACCU-CHEK GUIDE test strip [id number] Yes USE UP TO FOUR TIMES DAILY AS DIRECTED Leone Haven, MD Taking Active   allopurinol (ZYLOPRIM) 100 MG tablet [id number] Yes TAKE 1 TABLET DAILY Leone Haven, MD Taking Active   amLODipine (NORVASC) 10 MG tablet [id number] Yes TAKE 1 TABLET EVERY DAY Leone Haven, MD Taking Active   aspirin 325 MG tablet [id number] Yes Take 325 mg by mouth daily. [provider] Taking Active   atorvastatin (LIPITOR) 40 MG tablet [id number] Yes TAKE 1 TABLET EVERY DAY Leone Haven, MD Taking Active   B Complex Vitamins (VITAMIN-B COMPLEX PO) [id number] Yes Take by mouth. [provider] Taking Active   BETA CAROTENE PO [id number] Yes Take by mouth. [provider] Taking Active   blood glucose meter kit and supplies [id number] Yes Dispense based on patient and insurance preference. Use up to four times daily as directed. (FOR ICD-10 E10.9, E11.9). Leone Haven, MD Taking Active   Calcium Carbonate-Vitamin D (CALCIUM + D PO) [id number] Yes Take by mouth. [provider] Taking Active   doxazosin (CARDURA) 4 MG tablet [id number] Yes  [provider] Taking Active   Dulaglutide (TRULICITY) 4.5 KZ/9.9JT SOPN [id number] Yes Inject 4.5 mg as directed once a week. Leone Haven, MD Taking Active            Med  Note [name] Mar 04, 2020  1:18 PM) Using 3 mg   empagliflozin (JARDIANCE) 25 MG TABS tablet [id number] Yes Take 25 mg by mouth daily. Leone Haven, MD Taking Active   escitalopram (LEXAPRO) 20 MG tablet [id number] Yes TAKE 1 TABLET (20 MG) BY MOUTH DAILY Leone Haven, MD Taking Active   furosemide (LASIX) 20 MG tablet [id number] Yes Take one tablet (20 mg total) by mouth daily. Leone Haven, MD Taking Active   gabapentin (NEURONTIN) 400 MG capsule [id number] Yes Take 1 capsule (400 mg total) by mouth 3 (three) times daily. Leone Haven, MD  Taking Active   glipiZIDE (GLUCOTROL) 5 MG tablet [id number] Yes Take 1 tablet (5 mg total) by mouth 2 (two) times daily before a meal. Leone Haven, MD Taking Active            Med Note [name] Mar 04, 2020  1:18 PM)    hydrALAZINE (APRESOLINE) 10 MG tablet [id number]  TAKE 1 TABLET BY MOUTH UP TO TWICE DAILY AS NEEDED IF BLOOD PRESSURE> 130/80 Leone Haven, MD  Active   Insulin Glargine [facility] KWIKPEN) 100 UNIT/ML [id number] Yes Inject 30 Units into the skin daily. Leone Haven, MD Taking Active            Med Note [name] Mar 04, 2020  1:17 PM) Taking 2 shots of 15 units  insulin lispro (HUMALOG KWIKPEN) 100 UNIT/ML KwikPen [id number] Yes Inject 5 units up to TID if pre meal sugar >200 Leone Haven, MD Taking Active            Med Note Darnelle Maffucci, Arville Lime   Thu Mar 04, 2020  1:21 PM)    Insulin Pen Needle (PEN NEEDLES) 32G X 4 MM MISC [id number]  Use to inject insulin up to 4 times daily Leone Haven, MD  Active   losartan (COZAAR) 100 MG tablet [id number] Yes TAKE 1 TABLET EVERY DAY Leone Haven, MD Taking Active   magnesium 30 MG tablet [id number] Yes Take 30 mg by mouth 2 (two) times daily. [provider] Taking Active   metFORMIN (GLUCOPHAGE XR) 500 MG 24 hr tablet [id number] Yes Take 2 tablets (1,000 mg total) by mouth in the morning and at  bedtime. Leone Haven, MD Taking Active   metoprolol succinate (TOPROL-XL) 100 MG 24 hr tablet [id number] Yes TAKE 1 TABLET EVERY DAY Leone Haven, MD Taking Active   Multiple Vitamin (MULTIVITAMIN) tablet [id number] Yes Take 1 tablet by mouth daily. [provider] Taking Active   pantoprazole (PROTONIX) 40 MG tablet [id number] Yes Take 1 tablet (40 mg total) by mouth daily. Leone Haven, MD Taking Active   potassium chloride (KLOR-CON) 10 MEQ tablet [id number] Yes TAKE 1 TABLET BY MOUTH DAILY WITH LASIX Leone Haven, MD Taking Active   Zinc Sulfate (ZINC 15 PO) [id number]  Take by mouth. [provider]  Active           Patient Active Problem List   Diagnosis Date Noted  . Overweight 05/30/2019  . Acute kidney failure ([place]) 01/22/2019  . Benign hypertensive kidney disease with chronic kidney disease 01/22/2019  . Secondary hyperparathyroidism of renal origin ([place]) 01/22/2019  . Stage 3a chronic kidney disease ([place]) 01/22/2019  . Lightheadedness 12/19/2018  . Diarrhea 01/21/2018  . Skin lesions 01/21/2018  . Esophagitis   . Proteinuria 10/10/2017  . Onychomycosis 03/19/2017  . Anxiety and depression 09/19/2016  . Hypokalemia 09/19/2016  . Loss of height 05/01/2016  . Left shoulder pain 01/31/2016  . Prostate cancer screening 09/27/2012  . Sleep apnea 11/22/2011  . Chronic back pain 08/17/2011  . Hyperlipidemia 08/17/2011  . Gout 08/17/2011  . Type 2 diabetes, uncontrolled, with neuropathy ([place]) 02/20/2011  . Neuropathy ([place]) 02/17/2011  . Hypertension 10/13/2010    Immunization History  Administered Date(s) Administered  . Fluad Quad(high Dose 65+) 09/25/2019  . Influenza Split 10/13/2010, 11/22/2011  . Influenza, High Dose Seasonal PF 11/01/2015, 10/30/2016, 10/10/2017  . Influenza,inj,Quad PF,6+ Mos 09/27/2012, 10/20/2013, 09/28/2014  . Moderna Sars-Covid-2  Vaccination 03/20/2019, 04/17/2019  . Pneumococcal Conjugate-13 04/14/2013   . Pneumococcal Polysaccharide-23 08/17/2011  . Tdap 08/16/2009    Conditions to be addressed/monitored:  Hypertension, Hyperlipidemia, Diabetes and Chronic Kidney Disease  Care Plan : Medication Management  Updates made by De Hollingshead, RPH-CPP since 03/04/2020 12:00 AM    Problem: Diabetes, Hypertension, CKD, Depression     Long-Range Goal: Disease Progression Prevention   Recent Progress: On track  Priority: High  Note:   Current Barriers:  . Unable to independently afford treatment regimen . Unable to independently monitor therapeutic efficacy . Unable to achieve control of diabetes   Pharmacist Clinical Goal(s):  Marland Kitchen Over the next 90 days, patient will verbalize ability to afford treatment regimen. . Over the next 90 days, patient will achieve control of diabetes as evidenced by improvement in A1c through collaboration with PharmD and provider.   Interventions: . 1:1 collaboration with Leone Haven, MD regarding development and update of comprehensive plan of care as evidenced by provider attestation and co-signature . Inter-disciplinary care team collaboration (see longitudinal plan of care) . Comprehensive medication review performed; medication list updated in electronic medical record  Health Maintenance: . Reviewed upcoming appointments, including RN CM outreach next week . Attempted to perform medication review by having patient get his pill bottles and read them off to me. He refused to do so, noting that "nothing has changed". Notes that his mail order insurance lets him fill prescriptions ~ 2 months after they had last been filled, so he has been able to fill things early and notes he has a significant stock. Uses a weekly pill box. Notes that his fridge is "almost full" of insulin. Discussed calling [employer] to take himself off auto refill, but he declines to do this, because he is afraid he will forget to fill in time and will run out.  . Due for COVID  booster. Encouraged. . Due for eye exam.   Diabetes: . Controlled per A1c but SMBG continues to be elevated; current treatment: metformin XR 1000 mg BID, Jardiance 25 mg daily, Trulicity 3 mg weekly- reports he is using up a supply of 3 mg before increasing to 4.5 mg) Basaglar 30 units daily (prefers to split and take 15 units in two separate shots, Humalog 5-7 units TID w/meals, glipizide 5 mg QPM.  . Approved for patient assistance for Basaglar, Humalog, and Jardiance through 2022 . Current glucose readings (using Libre 2 samples) Date of Download: 02/20/20-03/04/20 % Time CGM is active: 61% Average Glucose: 213 mg/dL Glucose Management Indicator: 8.4  Glucose Variability: 28.9 (goal <36%) Time in Goal:  - Time in range 70-180: 35% - Time above range: 65% - Time below range: 0% Observed patterns: elevated later in the day after meals, and remains elevated overnight . Denies any concerns with hypoglycemia . Asked about medication adherence in multiple ways, but patient continues to endorse adherence to his oral medications and injections.  . Current meal patterns: reports that he has been eating more oatmeal recently, and has been eating a big serving size (reports he makes ~ 1 cup). Reviewed appropriate portion sizes. He notes that he needs to "get back under control" with food choices. Endorses more stress over the past several months with financial/home owning responsibilities that his wife used to manage.  . Discussed risk of hypoglycemia w/ bolus insulin and glipizide. Discontinue glipizide. Increase Humalog to 10 units up to three times daily with meals. At this time, continue Basaglar 15 units twice  daily (per patient preference), Trulicity 3 mg weekly (to increase to 4.5 mg in a few weeks when he finishes 3 mg supply), metformin XR 1000 mg BID. Likely increase basal coverage as next step . Working on [employer] coverage through Insurance underwriter. Reviewed that we received documentation that [facility]CS  Medical needs a provider's office visit discussing that patient is meeting Medicare requirements for CGM use. Patient has PCP f/u next week, will collaborate w/ Dr. Caryl Bis to ensure all necessary things are documented and will fax to [place].   Hypertension: . Controlled; current treatment: furosemide 20 mg QAM, losartan 100 mg QAM, metoprolol XL 100 mg QAM, amlodipine 10 mg daily QAM, doxazosin 4 mg QPM . Home readings: declines to discuss home readings today . Recommend to continue current regimen at this time. Will collaborate w/ RN CM for disease management education and lifestyle modification.   Hyperlipidemia: . Controlled per last lipid panel; current treatment: atorvastatin 40 mg daily . Antiplatelet regimen: aspirin 325 mg daily (hx CVA per chart review) . Recommend to continue current regimen at this time.   Depression: . Controlled per patient report, though he endorses significant stress with home management activities (bills, phone services, notes his wife used to manage all of this); current treatment: escitalopram 20 mg daily . Recommend to continue current regimen at this time. Will follow for opportunities to suggest therapy support.   Patient Goals/Self-Care Activities . Over the next 90 days, patient will:  - take medications as prescribed check glucose three times daily using CGM, document, and provide at future appointments check blood pressure periodically, document, and provide at future appointments collaborate with provider on medication access solutions  Follow Up Plan: Telephone follow up appointment with care management team member scheduled for:~ 6 weeks          Medication Assistance: Trulicity, Engineer, agricultural, Novolog obtained through [employer] medication assistance program.  Enrollment ends 12/01/20. Jardiance obtained through [place] medication assistance program.  Enrollment ends 01/01/21  Patient's preferred pharmacy is:  [facility]  [id number], [place] [address] Phone: [phone] Fax: [phone]  [facility] [address], [place] [address] Phone: [phone] Fax: [phone]   Care Plan and Follow Up Patient Decision:  Patient agrees to Care Plan and Follow-up.  Plan: Telephone follow up appointment with care management team member scheduled for:  ~ 6 weeks  Catie Darnelle Maffucci, PharmD, [place], [place] Clinical Pharmacist [employer] at [employer] [phone]

## 2020-03-08 ENCOUNTER — Ambulatory Visit: Payer: Medicare Other | Admitting: *Deleted

## 2020-03-08 DIAGNOSIS — I129 Hypertensive chronic kidney disease with stage 1 through stage 4 chronic kidney disease, or unspecified chronic kidney disease: Secondary | ICD-10-CM | POA: Diagnosis not present

## 2020-03-08 DIAGNOSIS — E1165 Type 2 diabetes mellitus with hyperglycemia: Secondary | ICD-10-CM

## 2020-03-08 DIAGNOSIS — F419 Anxiety disorder, unspecified: Secondary | ICD-10-CM

## 2020-03-08 DIAGNOSIS — I10 Essential (primary) hypertension: Secondary | ICD-10-CM

## 2020-03-08 DIAGNOSIS — E785 Hyperlipidemia, unspecified: Secondary | ICD-10-CM

## 2020-03-08 DIAGNOSIS — F32A Depression, unspecified: Secondary | ICD-10-CM

## 2020-03-08 DIAGNOSIS — IMO0002 Reserved for concepts with insufficient information to code with codable children: Secondary | ICD-10-CM

## 2020-03-08 DIAGNOSIS — E1140 Type 2 diabetes mellitus with diabetic neuropathy, unspecified: Secondary | ICD-10-CM

## 2020-03-09 NOTE — Chronic Care Management (AMB) (Signed)
Chronic Care Management   CCM RN Visit Note  03/09/2020 Name: Shane Jones MRN: [id number] DOB: July 26, 1945  Subjective: Shane Jones is a 75 y.o. year old male who is a primary care patient of Caryl Bis, Angela Adam, MD. The care management team was consulted for assistance with disease management and care coordination needs.    Engaged with patient by telephone for initial visit in response to provider referral for case management and/or care coordination services.   Consent to Services:  The patient was given the following information about Chronic Care Management services today, agreed to services, and gave verbal consent: 1. CCM service includes personalized support from designated clinical staff supervised by the primary care provider, including individualized plan of care and coordination with other care providers 2. 24/7 contact phone numbers for assistance for urgent and routine care needs. 3. Service will only be billed when office clinical staff spend 20 minutes or more in a month to coordinate care. 4. Only one practitioner may furnish and bill the service in a calendar month. 5.The patient may stop CCM services at any time (effective at the end of the month) by phone call to the office staff. 6. The patient will be responsible for cost sharing (co-pay) of up to 20% of the service fee (after annual deductible is met). Patient agreed to services and consent obtained.  Patient agreed to services and verbal consent obtained.   Assessment: Review of patient past medical history, allergies, medications, health status, including review of consultants reports, laboratory and other test data, was performed as part of comprehensive evaluation and provision of chronic care management services.   SDOH (Social Determinants of Health) assessments and interventions performed:  SDOH Interventions   Flowsheet Row Most Recent Value  SDOH Interventions   Food Insecurity Interventions Intervention Not  Indicated  Financial Strain Interventions Other (Comment)  [is getting medication assistance and got assistance paying for fuel Heat]  Intimate Partner Violence Interventions Intervention Not Indicated  Stress Interventions Intervention Not Indicated  Transportation Interventions Intervention Not Indicated       CCM Care Plan  Allergies  Allergen Reactions  . Uloric [Febuxostat] Hives    hives    Outpatient Encounter Medications as of 03/08/2020  Medication Sig Note  . allopurinol (ZYLOPRIM) 100 MG tablet TAKE 1 TABLET DAILY   . amLODipine (NORVASC) 10 MG tablet TAKE 1 TABLET EVERY DAY   . aspirin 325 MG tablet Take 325 mg by mouth daily.   Marland Kitchen atorvastatin (LIPITOR) 40 MG tablet TAKE 1 TABLET EVERY DAY   . B Complex Vitamins (VITAMIN-B COMPLEX PO) Take by mouth.   Marland Kitchen BETA CAROTENE PO Take by mouth.   . Calcium Carbonate-Vitamin D (CALCIUM + D PO) Take by mouth.   . doxazosin (CARDURA) 4 MG tablet    . Dulaglutide (TRULICITY) 4.5 QR/9.7JO SOPN Inject 4.5 mg as directed once a week. 03/04/2020: Using 3 mg   . empagliflozin (JARDIANCE) 25 MG TABS tablet Take 25 mg by mouth daily.   Marland Kitchen escitalopram (LEXAPRO) 20 MG tablet TAKE 1 TABLET (20 MG) BY MOUTH DAILY   . furosemide (LASIX) 20 MG tablet Take one tablet (20 mg total) by mouth daily.   Marland Kitchen gabapentin (NEURONTIN) 400 MG capsule Take 1 capsule (400 mg total) by mouth 3 (three) times daily.   Marland Kitchen glipiZIDE (GLUCOTROL) 5 MG tablet Take 1 tablet by mouth 2 (two) times daily.   . hydrALAZINE (APRESOLINE) 10 MG tablet TAKE 1 TABLET BY MOUTH UP TO  TWICE DAILY AS NEEDED IF BLOOD PRESSURE> 130/80   . Insulin Glargine (BASAGLAR KWIKPEN) 100 UNIT/ML Inject 30 Units into the skin daily. 03/04/2020: Taking 2 shots of 15 units  . insulin lispro (HUMALOG KWIKPEN) 100 UNIT/ML KwikPen Inject 10 units up to three times daily with meals 03/08/2020: Reports taking 10 units TID with meals if blood sugars >200  . losartan (COZAAR) 100 MG tablet TAKE 1 TABLET EVERY DAY    . magnesium 30 MG tablet Take 30 mg by mouth 2 (two) times daily.   . metFORMIN (GLUCOPHAGE XR) 500 MG 24 hr tablet Take 2 tablets (1,000 mg total) by mouth in the morning and at bedtime.   . metoprolol succinate (TOPROL-XL) 100 MG 24 hr tablet TAKE 1 TABLET EVERY DAY   . Multiple Vitamin (MULTIVITAMIN) tablet Take 1 tablet by mouth daily.   . pantoprazole (PROTONIX) 40 MG tablet Take 1 tablet (40 mg total) by mouth daily.   . potassium chloride (KLOR-CON) 10 MEQ tablet TAKE 1 TABLET BY MOUTH DAILY WITH LASIX   . Zinc Sulfate (ZINC 15 PO) Take by mouth.   . Accu-Chek FastClix Lancets MISC USE UP TO FOUR TIMES DAILY AS DIRECTED   . ACCU-CHEK GUIDE test strip USE UP TO FOUR TIMES DAILY AS DIRECTED   . blood glucose meter kit and supplies Dispense based on patient and insurance preference. Use up to four times daily as directed. (FOR ICD-10 E10.9, E11.9).   . Insulin Pen Needle (PEN NEEDLES) 32G X 4 MM MISC Use to inject insulin up to 4 times daily    No facility-administered encounter medications on file as of 03/08/2020.    Patient Active Problem List   Diagnosis Date Noted  . Overweight 05/30/2019  . Acute kidney failure ([place]) 01/22/2019  . Benign hypertensive kidney disease with chronic kidney disease 01/22/2019  . Secondary hyperparathyroidism of renal origin ([place]) 01/22/2019  . Stage 3a chronic kidney disease ([place]) 01/22/2019  . Lightheadedness 12/19/2018  . Diarrhea 01/21/2018  . Skin lesions 01/21/2018  . Esophagitis   . Proteinuria 10/10/2017  . Onychomycosis 03/19/2017  . Anxiety and depression 09/19/2016  . Hypokalemia 09/19/2016  . Loss of height 05/01/2016  . Left shoulder pain 01/31/2016  . Prostate cancer screening 09/27/2012  . Sleep apnea 11/22/2011  . Chronic back pain 08/17/2011  . Hyperlipidemia 08/17/2011  . Gout 08/17/2011  . Type 2 diabetes, uncontrolled, with neuropathy ([place]) 02/20/2011  . Neuropathy ([place]) 02/17/2011  . Hypertension 10/13/2010     Conditions to be addressed/monitored:HTN and DMII  Patient Care Plan: Diabetes Type 2 (Adult)  Problem Identified: Diabetes Management   Priority: Medium  Long-Range Goal: Patient will report maintaining Hgb A1C of below 7 within the next 90 days   Start Date: 03/08/2020  Expected End Date: 08/31/2020  Priority: Medium  Objective:  Lab Results  Component Value Date   HGBA1C 6.6 (A) 12/10/2019 .   Lab Results  Component Value Date   CREATININE 1.81 (H) 09/01/2019   CREATININE 1.85 (H) 01/14/2019   CREATININE 1.31 12/26/2018   Current Barriers:  Marland Kitchen Knowledge Deficits related to basic Diabetes pathophysiology and self care/management as evidenced by patient reporting blood sugars ranging 170-200's recently.  States he feels his Hgb A1C will be elevated at next check.  States he has been trying to manage his diabetes with medications and food choices, but has been trying to prevent any hypoglycemic episodes.  Denies any recent hypoglycemic episodes.  Is wearing the continuous blood glucose meter  samples and is hoping to get new meter approved through insurance soon.  Acknowledges the difficulties with affording medications but is now getting his diabetes medications from medication assistance programs.  Also agrees he can learn and make better food choices. . Difficulty obtaining or cannot afford medications Case Manager Clinical Goal(s):  . patient will demonstrate improved adherence to prescribed treatment plan for diabetes self care/management as evidenced by: at least 4 times a daiy monitoring and recording of CBG, adherence to ADA/ carb modified diet, and adherence to prescribed medication regimen Interventions:  . Collaboration with Leone Haven, MD regarding development and update of comprehensive plan of care as evidenced by provider attestation and co-signature . Inter-disciplinary care team collaboration (see longitudinal plan of care) . Provided education to patient about  basic DM disease process; sending Living Well with Diabetes Educational Packet . Reviewed medications with patient and discussed importance of medication adherence . Discussed plans with patient for ongoing care management follow up and provided patient with direct contact information for care management team . Provided patient with written educational materials related to hypo and hyperglycemia and importance of correct treatment . Reviewed scheduled/upcoming provider appointments including: Dr. Caryl Bis on 03/10/2020 . Advised patient, providing education and rationale, to check cbg at least 4 times a day and record, calling primary care provider of CCM Pharmacist for findings outside established parameters.   . Encouraged to continue to work with CCM Pharmacist . Quality of sleep assessed . Discussed neuropathy in feet and monitoring pain with increases in blood sugars . Reviewed current glucose ranges and discussed importance of better glycemic control and ways to help reduce blood sugars . Sending education on diabetic diet . Discussed importance of annual diabetic eye exam and encouraged patient to schedule as soon as possible Patient Goals/Self-Care Activities: . Check blood sugar at least 4 times a day . Check blood sugar if I feel it is too high or too low . Take the blood sugar log and meter to all doctor visits . Try to limit carbohydrates in diet (review diet education mailed) . Take medications as prescribed . Schedule Eye Exam in the next month Follow Up Plan: The care management team will reach out to the patient again over the next 30 business days.    Patient Care Plan: Hypertension (Adult)  Problem Identified: Hypertension (Hypertension)   Goal: Hypertension Monitored   Start Date: 03/08/2020  Expected End Date: 08/31/2020  Priority: Medium  Current Barriers:  Marland Kitchen Knowledge Deficits related to basic understanding of hypertension pathophysiology and self care management.   Patient reporting he monitors blood pressures daily with ranges of 100-140/70-80's.  Denies any significant hypo or hypertensive episodes.  Does state he is unaware of signs or symptoms of hypertension.  He does report trying to maintain low salt diet. Nurse Case Manager Clinical Goal(s):  Marland Kitchen Over the next 90 days, patient will verbalize understanding of plan for hypertension management . Over the next 90 days, patient will demonstrate improved adherence to prescribed treatment plan for hypertension as evidenced by taking all medications as prescribed, monitoring and recording blood pressure as directed, adhering to low sodium/DASH diet Interventions:  . Collaboration with Leone Haven, MD regarding development and update of comprehensive plan of care as evidenced by provider attestation and co-signature . Inter-disciplinary care team collaboration (see longitudinal plan of care) . Evaluation of current treatment plan related to hypertension self management and patient's adherence to plan as established by provider. . Provided education to patient  re: stroke prevention, s/s of heart attack and stroke, DASH diet, complications of uncontrolled blood pressure . Reviewed medications with patient and discussed importance of compliance . Discussed plans with patient for ongoing care management follow up and provided patient with direct contact information for care management team . Advised patient, providing education and rationale, to monitor blood pressure daily and record, calling PCP for findings outside established parameters.  . Reviewed scheduled/upcoming provider appointments including:  Dr. Caryl Bis on 03/10/2020 . Blood pressure trends reviewed . Home blood pressure monitoring encouraged to continue and sending 2022 Calendar Booklet to help with documentation . Encouraged patient to take log to medical appointments for provider review Patient Goals/Self-Care Activities:  Over the next 30  days, patient will: . Check blood pressure daily . Write blood pressure results in a log  . Take log to Medical appointments for provider review . Low salt diet . Notify provider for sustained elevations not decreasing with as needed medication Follow Up Plan: The care management team will reach out to the patient again over the next 30 business days.        Plan:The care management team will reach out to the patient again over the next 30 business days.  Hubert Azure RN, MSN RN Care Management Coordinator [place] [phone] [email or web address]

## 2020-03-09 NOTE — Patient Instructions (Addendum)
Visit Information  PATIENT GOALS: Goals Addressed            This Visit's Progress   . (RNCM) Monitor and Manage My Blood Sugar-Diabetes Type 2       Timeframe:  Long-Range Goal Priority:  Medium Start Date:  03/08/20                           Expected End Date:   08/31/20                    Follow Up Date 03/29/20    . Check blood sugar at least 4 times a day . Check blood sugar if I feel it is too high or too low . Take the blood sugar log and meter to all doctor visits . Try to limit carbohydrates in diet (review diet education mailed) . Take medications as prescribed . Schedule Eye Exam in the next month   Why is this important?    Checking your blood sugar at home helps to keep it from getting very high or very low.   Writing the results in a diary or log helps the doctor know how to care for you.   Your blood sugar log should have the time, date and the results.   Also, write down the amount of insulin or other medicine that you take.   Other information, like what you ate, exercise done and how you were feeling, will also be helpful.     Notes:    Marland Kitchen [facility]) Track and Manage My Blood Pressure-Hypertension       Timeframe:  Long-Range Goal Priority:  Medium Start Date:   03/08/2020                          Expected End Date:  08/31/2020                     Follow Up Date 03/29/20    . Check blood pressure daily . Write blood pressure results in a log  . Take log to Medical appointments for provider review . Low salt diet . Notify provider for sustained elevations not decreasing with as needed medication   Why is this important?    You won't feel high blood pressure, but it can still hurt your blood vessels.   High blood pressure can cause heart or kidney problems. It can also cause a stroke.   Making lifestyle changes like losing a little weight or eating less salt will help.   Checking your blood pressure at home and at different times of the day can help to  control blood pressure.   If the doctor prescribes medicine remember to take it the way the doctor ordered.   Call the office if you cannot afford the medicine or if there are questions about it.     Notes:     The patient verbalized understanding of instructions, educational materials, and care plan provided today and agreed to receive a mailed copy of patient instructions, educational materials, and care plan.   The care management team will reach out to the patient again over the next 30 business days.   Hubert Azure RN, MSN RN Care Management Coordinator [place] [phone] [email or web address]   Diabetes Mellitus and Nutrition, Adult When you have diabetes, or diabetes mellitus, it is very important to have healthy eating habits because your blood  sugar (glucose) levels are greatly affected by what you eat and drink. Eating healthy foods in the right amounts, at about the same times every day, can help you:  Control your blood glucose.  Lower your risk of heart disease.  Improve your blood pressure.  Reach or maintain a healthy weight. What can affect my meal plan? Every person with diabetes is different, and each person has different needs for a meal plan. Your health care provider may recommend that you work with a dietitian to make a meal plan that is best for you. Your meal plan may vary depending on factors such as:  The calories you need.  The medicines you take.  Your weight.  Your blood glucose, blood pressure, and cholesterol levels.  Your activity level.  Other health conditions you have, such as heart or kidney disease. How do carbohydrates affect me? Carbohydrates, also called carbs, affect your blood glucose level more than any other type of food. Eating carbs naturally raises the amount of glucose in your blood. Carb counting is a method for keeping track of how many carbs you eat. Counting carbs is important to  keep your blood glucose at a healthy level, especially if you use insulin or take certain oral diabetes medicines. It is important to know how many carbs you can safely have in each meal. This is different for every person. Your dietitian can help you calculate how many carbs you should have at each meal and for each snack. How does alcohol affect me? Alcohol can cause a sudden decrease in blood glucose (hypoglycemia), especially if you use insulin or take certain oral diabetes medicines. Hypoglycemia can be a life-threatening condition. Symptoms of hypoglycemia, such as sleepiness, dizziness, and confusion, are similar to symptoms of having too much alcohol.  Do not drink alcohol if: ? Your health care provider tells you not to drink. ? You are pregnant, may be pregnant, or are planning to become pregnant.  If you drink alcohol: ? Do not drink on an empty stomach. ? Limit how much you use to:  0-1 drink a day for women.  0-2 drinks a day for men. ? Be aware of how much alcohol is in your drink. In the [place] one drink equals one 12 oz bottle of beer (355 mL), one 5 oz glass of wine (148 mL), or one 1 oz glass of hard liquor (44 mL). ? Keep yourself hydrated with water, diet soda, or unsweetened iced tea.  Keep in mind that regular soda, juice, and other mixers may contain a lot of sugar and must be counted as carbs. What are tips for following this plan? Reading food labels  Start by checking the serving size on the "Nutrition Facts" label of packaged foods and drinks. The amount of calories, carbs, fats, and other nutrients listed on the label is based on one serving of the item. Many items contain more than one serving per package.  Check the total grams (g) of carbs in one serving. You can calculate the number of servings of carbs in one serving by dividing the total carbs by 15. For example, if a food has 30 g of total carbs per serving, it would be equal to 2 servings of  carbs.  Check the number of grams (g) of saturated fats and trans fats in one serving. Choose foods that have a low amount or none of these fats.  Check the number of milligrams (mg) of salt (sodium) in one serving. Most people  should limit total sodium intake to less than 2,300 mg per day.  Always check the nutrition information of foods labeled as "low-fat" or "nonfat." These foods may be higher in added sugar or refined carbs and should be avoided.  Talk to your dietitian to identify your daily goals for nutrients listed on the label. Shopping  Avoid buying canned, pre-made, or processed foods. These foods tend to be high in fat, sodium, and added sugar.  Shop around the outside edge of the grocery store. This is where you will most often find fresh fruits and vegetables, bulk grains, fresh meats, and fresh dairy. Cooking  Use low-heat cooking methods, such as baking, instead of high-heat cooking methods like deep frying.  Cook using healthy oils, such as olive, canola, or sunflower oil.  Avoid cooking with butter, cream, or high-fat meats. Meal planning  Eat meals and snacks regularly, preferably at the same times every day. Avoid going long periods of time without eating.  Eat foods that are high in fiber, such as fresh fruits, vegetables, beans, and whole grains. Talk with your dietitian about how many servings of carbs you can eat at each meal.  Eat 4-6 oz (112-168 g) of lean protein each day, such as lean meat, chicken, fish, eggs, or tofu. One ounce (oz) of lean protein is equal to: ? 1 oz (28 g) of meat, chicken, or fish. ? 1 egg. ?  cup (62 g) of tofu.  Eat some foods each day that contain healthy fats, such as avocado, nuts, seeds, and fish.   What foods should I eat? Fruits Berries. Apples. Oranges. Peaches. Apricots. Plums. Grapes. Mango. Papaya. Pomegranate. Kiwi. Cherries. Vegetables Lettuce. Spinach. Leafy greens, including kale, chard, collard greens, and  mustard greens. Beets. Cauliflower. Cabbage. Broccoli. Carrots. Green beans. Tomatoes. Peppers. Onions. Cucumbers. Brussels sprouts. Grains Whole grains, such as whole-wheat or whole-grain bread, crackers, tortillas, cereal, and pasta. Unsweetened oatmeal. Quinoa. Brown or wild rice. Meats and other proteins Seafood. Poultry without skin. Lean cuts of poultry and beef. Tofu. Nuts. Seeds. Dairy Low-fat or fat-free dairy products such as milk, yogurt, and cheese. The items listed above may not be a complete list of foods and beverages you can eat. Contact a dietitian for more information. What foods should I avoid? Fruits Fruits canned with syrup. Vegetables Canned vegetables. Frozen vegetables with butter or cream sauce. Grains Refined white flour and flour products such as bread, pasta, snack foods, and cereals. Avoid all processed foods. Meats and other proteins Fatty cuts of meat. Poultry with skin. Breaded or fried meats. Processed meat. Avoid saturated fats. Dairy Full-fat yogurt, cheese, or milk. Beverages Sweetened drinks, such as soda or iced tea. The items listed above may not be a complete list of foods and beverages you should avoid. Contact a dietitian for more information. Questions to ask a health care provider  Do I need to meet with a diabetes educator?  Do I need to meet with a dietitian?  What number can I call if I have questions?  When are the best times to check my blood glucose? Where to find more information:  American Diabetes Association: diabetes.org  Academy of Nutrition and Dietetics: www.eatright.[employer] of Diabetes and Digestive and Kidney Diseases: [employer]  Association of Diabetes Care and Education Specialists: www.diabeteseducator.org Summary  It is important to have healthy eating habits because your blood sugar (glucose) levels are greatly affected by what you eat and drink.  A healthy meal plan will help you  control your blood glucose and maintain a healthy lifestyle.  Your health care provider may recommend that you work with a dietitian to make a meal plan that is best for you.  Keep in mind that carbohydrates (carbs) and alcohol have immediate effects on your blood glucose levels. It is important to count carbs and to use alcohol carefully. This information is not intended to replace advice given to you by your health care provider. Make sure you discuss any questions you have with your health care provider. Document Revised: 11/26/2018 Document Reviewed: 11/26/2018 Elsevier Patient Education  2021 [employer].

## 2020-03-10 ENCOUNTER — Other Ambulatory Visit: Payer: Self-pay

## 2020-03-10 ENCOUNTER — Ambulatory Visit (INDEPENDENT_AMBULATORY_CARE_PROVIDER_SITE_OTHER): Payer: Medicare Other | Admitting: Family Medicine

## 2020-03-10 DIAGNOSIS — E785 Hyperlipidemia, unspecified: Secondary | ICD-10-CM | POA: Diagnosis not present

## 2020-03-10 DIAGNOSIS — E1165 Type 2 diabetes mellitus with hyperglycemia: Secondary | ICD-10-CM | POA: Diagnosis not present

## 2020-03-10 DIAGNOSIS — I10 Essential (primary) hypertension: Secondary | ICD-10-CM | POA: Diagnosis not present

## 2020-03-10 DIAGNOSIS — E1140 Type 2 diabetes mellitus with diabetic neuropathy, unspecified: Secondary | ICD-10-CM | POA: Diagnosis not present

## 2020-03-10 DIAGNOSIS — IMO0002 Reserved for concepts with insufficient information to code with codable children: Secondary | ICD-10-CM

## 2020-03-10 MED ORDER — HYDRALAZINE HCL 10 MG PO TABS: 10.0000 mg | ORAL_TABLET | Freq: Two times a day (BID) | ORAL | 2 refills | Status: DC

## 2020-03-10 NOTE — Patient Instructions (Addendum)
Nice to see you. We will check labs today. I will discuss with Catie the best option for your insulin.

## 2020-03-10 NOTE — Progress Notes (Signed)
Marikay Alar, MD Phone: [phone]  Shane Jones is a 75 y.o. male who presents today for f/u.  DIABETES Disease Monitoring: Blood Sugar ranges-checking glucose with freestyle libre 0-6 times daily Polyuria/phagia/dipsia- no      Optho- due this month Medications: Compliance- taking Basaglar 30 units twice daily about 5 days a week, the other 2 days he takes 30 units in the morning and does not take the evening dose if his sugar is less than 200, taking Humalog 10 units with meals 3 times daily, taking Trulicity 4.5 mg once weekly, also on Jardiance and Metformin. Hypoglycemic symptoms-very rare, typically eat something and this improves  HYPERTENSION  Disease Monitoring  Home BP Monitoring notes typical systolic BPs 120-140 chest pain-no     dyspnea-no Medications  Compliance-taking amlodipine, Cardura, Lasix, losartan, and metoprolol, he also takes hydralazine on an as-needed basis if his blood pressures greater than 130/80.  Edema-no     Social History   Tobacco Use  Smoking Status Former Smoker  . Quit date: 11/20/1978  . Years since quitting: 41.3  Smokeless Tobacco Never Used    Current Outpatient Medications on File Prior to Visit  Medication Sig Dispense Refill  . Accu-Chek FastClix Lancets MISC USE UP TO FOUR TIMES DAILY AS DIRECTED 102 each 2  . ACCU-CHEK GUIDE test strip USE UP TO FOUR TIMES DAILY AS DIRECTED 100 each 2  . allopurinol (ZYLOPRIM) 100 MG tablet TAKE 1 TABLET DAILY 90 tablet 03  . amLODipine (NORVASC) 10 MG tablet TAKE 1 TABLET EVERY DAY 90 tablet 2  . aspirin 325 MG tablet Take 325 mg by mouth daily.    Marland Kitchen atorvastatin (LIPITOR) 40 MG tablet TAKE 1 TABLET EVERY DAY 90 tablet 3  . B Complex Vitamins (VITAMIN-B COMPLEX PO) Take by mouth.    Marland Kitchen BETA CAROTENE PO Take by mouth.    . blood glucose meter kit and supplies Dispense based on patient and insurance preference. Use up to four times daily as directed. (FOR ICD-10 E10.9, E11.9). 1 each 0  .  Calcium Carbonate-Vitamin D (CALCIUM + D PO) Take by mouth.    . doxazosin (CARDURA) 4 MG tablet     . Dulaglutide (TRULICITY) 4.5 MG/0.5ML SOPN Inject 4.5 mg as directed once a week. 6 mL 1  . empagliflozin (JARDIANCE) 25 MG TABS tablet Take 25 mg by mouth daily. 90 tablet 3  . escitalopram (LEXAPRO) 20 MG tablet TAKE 1 TABLET (20 MG) BY MOUTH DAILY 90 tablet 1  . furosemide (LASIX) 20 MG tablet Take one tablet (20 mg total) by mouth daily. 90 tablet 1  . gabapentin (NEURONTIN) 400 MG capsule Take 1 capsule (400 mg total) by mouth 3 (three) times daily. 270 capsule 0  . glipiZIDE (GLUCOTROL) 5 MG tablet Take 1 tablet by mouth 2 (two) times daily.    . Insulin Glargine (BASAGLAR KWIKPEN) 100 UNIT/ML Inject 30 Units into the skin daily. 15 mL 0  . insulin lispro (HUMALOG KWIKPEN) 100 UNIT/ML KwikPen Inject 10 units up to three times daily with meals 3 mL 0  . Insulin Pen Needle (PEN NEEDLES) 32G X 4 MM MISC Use to inject insulin up to 4 times daily 400 each 3  . losartan (COZAAR) 100 MG tablet TAKE 1 TABLET EVERY DAY 90 tablet 2  . magnesium 30 MG tablet Take 30 mg by mouth 2 (two) times daily.    . metFORMIN (GLUCOPHAGE XR) 500 MG 24 hr tablet Take 2 tablets (1,000 mg total) by  mouth in the morning and at bedtime. 360 tablet 3  . metoprolol succinate (TOPROL-XL) 100 MG 24 hr tablet TAKE 1 TABLET EVERY DAY 90 tablet 1  . Multiple Vitamin (MULTIVITAMIN) tablet Take 1 tablet by mouth daily.    . pantoprazole (PROTONIX) 40 MG tablet Take 1 tablet (40 mg total) by mouth daily. 90 tablet 0  . potassium chloride (KLOR-CON) 10 MEQ tablet TAKE 1 TABLET BY MOUTH DAILY WITH LASIX 90 tablet 0  . Zinc Sulfate (ZINC 15 PO) Take by mouth.     No current facility-administered medications on file prior to visit.     ROS see history of present illness  Objective  Physical Exam Vitals:   03/10/20 1456  BP: 140/80  Pulse: 86  Temp: 98.5 F (36.9 C)  SpO2: 97%    BP Readings from Last 3 Encounters:   03/10/20 140/80  12/10/19 118/70  09/01/19 120/80   Wt Readings from Last 3 Encounters:  03/10/20 187 lb 12.8 oz (85.2 kg)  02/10/20 182 lb (82.6 kg)  12/10/19 182 lb 8 oz (82.8 kg)    Physical Exam Constitutional:      General: He is not in acute distress.    Appearance: He is not diaphoretic.  Cardiovascular:     Rate and Rhythm: Normal rate and regular rhythm.     Heart sounds: Normal heart sounds.  Pulmonary:     Effort: Pulmonary effort is normal.     Breath sounds: Normal breath sounds.  Musculoskeletal:        General: No edema.     Right lower leg: No edema.     Left lower leg: No edema.  Skin:    General: Skin is warm and dry.  Neurological:     Mental Status: He is alert.      Assessment/Plan: Please see individual problem list.  Problem List Items Addressed This Visit    Hyperlipidemia (Chronic)    Check lipid panel.      Relevant Medications   hydrALAZINE (APRESOLINE) 10 MG tablet   Other Relevant Orders   Lipid panel   Hypertension (Chronic)    Above goal.  I have advised him to start taking his hydralazine on a schedule 10 mg twice daily.  He will continue on metoprolol 100 mg daily, losartan 100 mg daily, Cardura 4 mg daily, furosemide 20 mg daily, and amlodipine 10 mg daily.  Follow-up for blood pressure check in about a month.      Relevant Medications   hydrALAZINE (APRESOLINE) 10 MG tablet   Other Relevant Orders   Basic Metabolic Panel (BMET)   Type 2 diabetes, uncontrolled, with neuropathy (HCC) (Chronic)    Uncontrolled.  The patient has been taking his Basaglar 30 units twice daily most days of the week.  I encouraged him to take this on a twice twice a day on a daily basis.  He will continue his Humalog 10 units 3 times daily, Trulicity 4.5 units once weekly, Jardiance 25 mg daily, and Metformin XR 1000 mg twice daily.  I will confer with our clinical pharmacist regarding his regimen as well.  Check labs today.      Relevant Orders    HgB A1c    Other Visit Diagnoses    Essential hypertension       Relevant Medications   hydrALAZINE (APRESOLINE) 10 MG tablet        This visit occurred during the SARS-CoV-2 public health emergency.  Safety protocols were in place, including  screening questions prior to the visit, additional usage of staff PPE, and extensive cleaning of exam room while observing appropriate contact time as indicated for disinfecting solutions.    [name], MD [place]

## 2020-03-10 NOTE — Assessment & Plan Note (Signed)
Above goal.  I have advised him to start taking his hydralazine on a schedule 10 mg twice daily.  He will continue on metoprolol 100 mg daily, losartan 100 mg daily, Cardura 4 mg daily, furosemide 20 mg daily, and amlodipine 10 mg daily.  Follow-up for blood pressure check in about a month.

## 2020-03-10 NOTE — Assessment & Plan Note (Signed)
Uncontrolled.  The patient has been taking his Basaglar 30 units twice daily most days of the week.  I encouraged him to take this on a twice twice a day on a daily basis.  He will continue his Humalog 10 units 3 times daily, Trulicity 4.5 units once weekly, Jardiance 25 mg daily, and Metformin XR 1000 mg twice daily.  I will confer with our clinical pharmacist regarding his regimen as well.  Check labs today.

## 2020-03-10 NOTE — Assessment & Plan Note (Signed)
Check lipid panel  

## 2020-03-11 ENCOUNTER — Ambulatory Visit: Payer: Medicare Other | Admitting: Pharmacist

## 2020-03-11 ENCOUNTER — Telehealth: Payer: Medicare Other

## 2020-03-11 ENCOUNTER — Other Ambulatory Visit: Payer: Medicare Other

## 2020-03-11 DIAGNOSIS — E785 Hyperlipidemia, unspecified: Secondary | ICD-10-CM

## 2020-03-11 DIAGNOSIS — F419 Anxiety disorder, unspecified: Secondary | ICD-10-CM | POA: Diagnosis not present

## 2020-03-11 DIAGNOSIS — F32A Depression, unspecified: Secondary | ICD-10-CM | POA: Diagnosis not present

## 2020-03-11 DIAGNOSIS — E1140 Type 2 diabetes mellitus with diabetic neuropathy, unspecified: Secondary | ICD-10-CM

## 2020-03-11 DIAGNOSIS — I129 Hypertensive chronic kidney disease with stage 1 through stage 4 chronic kidney disease, or unspecified chronic kidney disease: Secondary | ICD-10-CM | POA: Diagnosis not present

## 2020-03-11 DIAGNOSIS — IMO0002 Reserved for concepts with insufficient information to code with codable children: Secondary | ICD-10-CM

## 2020-03-11 DIAGNOSIS — I10 Essential (primary) hypertension: Secondary | ICD-10-CM

## 2020-03-11 DIAGNOSIS — E1165 Type 2 diabetes mellitus with hyperglycemia: Secondary | ICD-10-CM | POA: Diagnosis not present

## 2020-03-11 DIAGNOSIS — G629 Polyneuropathy, unspecified: Secondary | ICD-10-CM

## 2020-03-11 LAB — LIPID PANEL
Cholesterol: 146 mg/dL (ref 0–200)
HDL: 30.1 mg/dL — ABNORMAL LOW (ref >39.00)
Total CHOL/HDL Ratio: 5
Triglycerides: 402 mg/dL — ABNORMAL HIGH (ref 0.0–149.0)

## 2020-03-11 LAB — BASIC METABOLIC PANEL
BUN: 19 mg/dL (ref 6–23)
CO2: 27 mEq/L (ref 19–32)
Calcium: 9.1 mg/dL (ref 8.4–10.5)
Chloride: 102 mEq/L (ref 96–112)
Creatinine, Ser: 1.65 mg/dL — ABNORMAL HIGH (ref 0.40–1.50)
GFR: 39.96 mL/min — ABNORMAL LOW (ref >60.00)
Glucose, Bld: 198 mg/dL — ABNORMAL HIGH (ref 70–99)
Potassium: 3.4 mEq/L — ABNORMAL LOW (ref 3.5–5.1)
Sodium: 140 mEq/L (ref 135–145)

## 2020-03-11 LAB — LDL CHOLESTEROL, DIRECT: Direct LDL: 79 mg/dL

## 2020-03-11 LAB — HEMOGLOBIN A1C: Hgb A1c MFr Bld: 7.8 % — ABNORMAL HIGH (ref 4.6–6.5)

## 2020-03-11 NOTE — Patient Instructions (Signed)
Visit Information  PATIENT GOALS: Goals Addressed              This Visit's Progress     Patient Stated   .  Medication Monitoring (pt-stated)        Patient Goals/Self-Care Activities . Over the next 90 days, patient will:  - take medications as prescribed check glucose three times daily using CGM, document, and provide at future appointments check blood pressure periodically, document, and provide at future appointments collaborate with provider on medication access solutions          The patient verbalized understanding of instructions, educational materials, and care plan provided today and declined offer to receive copy of patient instructions, educational materials, and care plan.   Plan: Telephone follow up appointment with care management team member scheduled for:  ~ 4 weeks as previously scheduled  Catie Darnelle Maffucci, PharmD, [place], [place] Clinical Pharmacist [employer] at [employer] [phone]

## 2020-03-11 NOTE — Chronic Care Management (AMB) (Signed)
Chronic Care Management Pharmacy Note  03/11/2020 Name:  Shane Jones MRN:  [id number] DOB:  12-Jun-1945  Subjective: Shane Jones is an 75 y.o. year old male who is a primary patient of Caryl Bis, Angela Adam, MD.  The CCM team was consulted for assistance with disease management and care coordination needs.    Care coordination for medication management in response to provider referral for pharmacy case management and/or care coordination services.   Consent to Services:  The patient was given information about Chronic Care Management services, agreed to services, and gave verbal consent prior to initiation of services.  Please see initial visit note for detailed documentation.   Patient Care Team: Leone Haven, MD as PCP - General (Family Medicine) De Hollingshead, RPH-CPP as Pharmacist (Pharmacist) Leona Singleton, RN as Case Manager  Recent office visits:  PCP visit 3/9 - lab work checked, A1c elevated.   RN CM visit 3/7- Discussion on DM and HTN control  Recent consult visits: None since our last call  Hospital visits: None in previous 6 months  Objective:  Lab Results  Component Value Date   CREATININE 1.65 (H) 03/10/2020   BUN 19 03/10/2020   GFR 39.96 (L) 03/10/2020   NA 140 03/10/2020   K 3.4 (L) 03/10/2020   CALCIUM 9.1 03/10/2020   CO2 27 03/10/2020    Lab Results  Component Value Date/Time   HGBA1C 7.8 (H) 03/10/2020 03:26 PM   HGBA1C 6.6 (A) 12/10/2019 04:17 PM   HGBA1C 13.2 (H) 09/01/2019 03:45 PM   GFR 39.96 (L) 03/10/2020 03:26 PM   GFR 36.59 (L) 09/01/2019 03:45 PM   MICROALBUR 21.2 (H) 09/01/2019 03:45 PM   MICROALBUR 25.5 (H) 07/27/2015 08:11 AM    Last diabetic Eye exam:  Lab Results  Component Value Date/Time   HMDIABEYEEXA No Retinopathy 04/16/2017 12:00 AM    Last diabetic Foot exam:  Lab Results  Component Value Date/Time   HMDIABFOOTEX Normal 07/27/2015 12:00 AM     Lab Results  Component Value Date   CHOL 146  03/10/2020   HDL 30.10 (L) 03/10/2020   LDLCALC 23 08/07/2016   LDLDIRECT 79.0 03/10/2020   TRIG (H) 03/10/2020    402.0 Triglyceride is over 400; calculations on Lipids are invalid.   CHOLHDL 5 03/10/2020    Hepatic Function Latest Ref Rng & Units 09/01/2019 12/26/2018 09/25/2018  Total Protein 6.0 - 8.3 g/dL 6.9 6.5 7.1  Albumin 3.5 - 5.2 g/dL 4.3 4.2 4.5  AST 0 - 37 U/L 13 20 18   ALT 0 - 53 U/L 11 17 19   Alk Phosphatase 39 - 117 U/L 75 68 67  Total Bilirubin 0.2 - 1.2 mg/dL 1.0 0.9 0.7    Lab Results  Component Value Date/Time   TSH 1.11 12/26/2018 09:30 AM   TSH 0.84 02/17/2011 08:30 AM    CBC Latest Ref Rng & Units 12/26/2018  WBC 4.0 - 10.5 K/uL 6.2  Hemoglobin 13.0 - 17.0 g/dL 16.2  Hematocrit 39.0 - 52.0 % 46.3  Platelets 150.0 - 400.0 K/uL 188.0    Clinical ASCVD: Yes  The 10-year ASCVD risk score Mikey Bussing DC Jr., et al., 2013) is: 60.3%   Values used to calculate the score:     Age: 66 years     Sex: Male     Is Non-Hispanic African American: No     Diabetic: Yes     Tobacco smoker: Yes     Systolic Blood Pressure: 379 mmHg  Is BP treated: Yes     HDL Cholesterol: 30.1 mg/dL     Total Cholesterol: 146 mg/dL    Depression screen [facility] 2/9 03/08/2020 02/10/2020 12/10/2019  Decreased Interest 0 0 0  Down, Depressed, Hopeless 0 0 0  PHQ - 2 Score 0 0 0  Altered sleeping - - -  Tired, decreased energy - - -  Change in appetite - - -  Feeling bad or failure about yourself  - - -  Trouble concentrating - - -  Moving slowly or fidgety/restless - - -  Suicidal thoughts - - -  PHQ-9 Score - - -     Social History   Tobacco Use  Smoking Status Former Smoker  . Quit date: 11/20/1978  . Years since quitting: 41.3  Smokeless Tobacco Never Used   BP Readings from Last 3 Encounters:  03/10/20 140/80  12/10/19 118/70  09/01/19 120/80   Pulse Readings from Last 3 Encounters:  03/10/20 86  12/10/19 69  09/01/19 90   Wt Readings from Last 3 Encounters:   03/10/20 187 lb 12.8 oz (85.2 kg)  02/10/20 182 lb (82.6 kg)  12/10/19 182 lb 8 oz (82.8 kg)    Assessment/Interventions: Review of patient past medical history, allergies, medications, health status, including review of consultants reports, laboratory and other test data, was performed as part of comprehensive evaluation and provision of chronic care management services.   SDOH:  (Social Determinants of Health) assessments and interventions performed: Yes SDOH Interventions   Flowsheet Row Most Recent Value  SDOH Interventions   Financial Strain Interventions Other (Comment)  [manufacturer assistance]      CCM Care Plan  Allergies  Allergen Reactions  . Uloric [Febuxostat] Hives    hives    Medications Reviewed Today    Reviewed by Gordy Councilman, CMA (Certified Medical Assistant) on 03/10/20 at 1458  Med List Status: <None>  Medication Order Taking? Sig Documenting Provider Last Dose Status Informant  Accu-Chek FastClix Lancets MISC [id number] Yes USE UP TO FOUR TIMES DAILY AS DIRECTED Leone Haven, MD Taking Active   ACCU-CHEK GUIDE test strip [id number] Yes USE UP TO FOUR TIMES DAILY AS DIRECTED Leone Haven, MD Taking Active   allopurinol (ZYLOPRIM) 100 MG tablet [id number] Yes TAKE 1 TABLET DAILY Leone Haven, MD Taking Active   amLODipine (NORVASC) 10 MG tablet [id number] Yes TAKE 1 TABLET EVERY DAY Leone Haven, MD Taking Active   aspirin 325 MG tablet [id number] Yes Take 325 mg by mouth daily. [provider] Taking Active   atorvastatin (LIPITOR) 40 MG tablet [id number] Yes TAKE 1 TABLET EVERY DAY Leone Haven, MD Taking Active   B Complex Vitamins (VITAMIN-B COMPLEX PO) [id number] Yes Take by mouth. [provider] Taking Active   BETA CAROTENE PO [id number] Yes Take by mouth. [provider] Taking Active   blood glucose meter kit and supplies [id number] Yes Dispense based on patient and insurance preference. Use up to  four times daily as directed. (FOR ICD-10 E10.9, E11.9). Leone Haven, MD Taking Active   Calcium Carbonate-Vitamin D (CALCIUM + D PO) [id number] Yes Take by mouth. [provider] Taking Active   doxazosin (CARDURA) 4 MG tablet [id number] Yes  [provider] Taking Active   Dulaglutide (TRULICITY) 4.5 QB/1.6XI SOPN [id number] Yes Inject 4.5 mg as directed once a week. Leone Haven, MD Taking Active            Med Note (Brandii Lakey E  Thu Mar 04, 2020  1:18 PM) Using 3 mg   empagliflozin (JARDIANCE) 25 MG TABS tablet [id number] Yes Take 25 mg by mouth daily. Leone Haven, MD Taking Active   escitalopram (LEXAPRO) 20 MG tablet [id number] Yes TAKE 1 TABLET (20 MG) BY MOUTH DAILY Leone Haven, MD Taking Active   furosemide (LASIX) 20 MG tablet [id number] Yes Take one tablet (20 mg total) by mouth daily. Leone Haven, MD Taking Active   gabapentin (NEURONTIN) 400 MG capsule [id number] Yes Take 1 capsule (400 mg total) by mouth 3 (three) times daily. Leone Haven, MD Taking Active   glipiZIDE (GLUCOTROL) 5 MG tablet [id number] Yes Take 1 tablet by mouth 2 (two) times daily. [provider] Taking Active   hydrALAZINE (APRESOLINE) 10 MG tablet [id number] Yes TAKE 1 TABLET BY MOUTH UP TO TWICE DAILY AS NEEDED IF BLOOD PRESSURE> 130/80 Leone Haven, MD Taking Active   Insulin Glargine [facility] KWIKPEN) 100 UNIT/ML [id number] Yes Inject 30 Units into the skin daily. Leone Haven, MD Taking Active            Med Note Darnelle Maffucci, Maralyn Sago Mar 04, 2020  1:17 PM) Taking 2 shots of 15 units  insulin lispro (HUMALOG KWIKPEN) 100 UNIT/ML KwikPen [id number] Yes Inject 10 units up to three times daily with meals Leone Haven, MD Taking Active            Med Note Leona Singleton   Mon Mar 08, 2020  1:47 PM) Reports taking 10 units TID with meals if blood sugars >200  Insulin Pen Needle (PEN NEEDLES) 32G X 4 MM MISC [id number] Yes Use  to inject insulin up to 4 times daily Leone Haven, MD Taking Active   losartan (COZAAR) 100 MG tablet [id number] Yes TAKE 1 TABLET EVERY DAY Leone Haven, MD Taking Active   magnesium 30 MG tablet [id number] Yes Take 30 mg by mouth 2 (two) times daily. [provider] Taking Active   metFORMIN (GLUCOPHAGE XR) 500 MG 24 hr tablet [id number] Yes Take 2 tablets (1,000 mg total) by mouth in the morning and at bedtime. Leone Haven, MD Taking Active   metoprolol succinate (TOPROL-XL) 100 MG 24 hr tablet [id number] Yes TAKE 1 TABLET EVERY DAY Leone Haven, MD Taking Active   Multiple Vitamin (MULTIVITAMIN) tablet [id number] Yes Take 1 tablet by mouth daily. [provider] Taking Active   pantoprazole (PROTONIX) 40 MG tablet [id number] Yes Take 1 tablet (40 mg total) by mouth daily. Leone Haven, MD Taking Active   potassium chloride (KLOR-CON) 10 MEQ tablet [id number] Yes TAKE 1 TABLET BY MOUTH DAILY WITH LASIX Leone Haven, MD Taking Active   Zinc Sulfate (ZINC 15 PO) [id number] Yes Take by mouth. [provider] Taking Active           Patient Active Problem List   Diagnosis Date Noted  . Overweight 05/30/2019  . Acute kidney failure ([place]) 01/22/2019  . Benign hypertensive kidney disease with chronic kidney disease 01/22/2019  . Secondary hyperparathyroidism of renal origin ([place]) 01/22/2019  . Stage 3a chronic kidney disease ([place]) 01/22/2019  . Lightheadedness 12/19/2018  . Diarrhea 01/21/2018  . Skin lesions 01/21/2018  . Esophagitis   . Proteinuria 10/10/2017  . Onychomycosis 03/19/2017  . Anxiety and depression 09/19/2016  . Hypokalemia 09/19/2016  . Loss of height 05/01/2016  . Left shoulder pain 01/31/2016  . Prostate cancer screening 09/27/2012  . Sleep  apnea 11/22/2011  . Chronic back pain 08/17/2011  . Hyperlipidemia 08/17/2011  . Gout 08/17/2011  . Type 2 diabetes, uncontrolled, with neuropathy ([place]) 02/20/2011  .  Neuropathy ([place]) 02/17/2011  . Hypertension 10/13/2010    Immunization History  Administered Date(s) Administered  . Fluad Quad(high Dose 65+) 09/25/2019  . Influenza Split 10/13/2010, 11/22/2011  . Influenza, High Dose Seasonal PF 11/01/2015, 10/30/2016, 10/10/2017  . Influenza,inj,Quad PF,6+ Mos 09/27/2012, 10/20/2013, 09/28/2014  . Moderna Sars-Covid-2 Vaccination 03/20/2019, 04/17/2019  . Pneumococcal Conjugate-13 04/14/2013  . Pneumococcal Polysaccharide-23 08/17/2011  . Tdap 08/16/2009    Conditions to be addressed/monitored:  Hypertension, Hyperlipidemia, Diabetes and Depression  Care Plan : Medication Management  Updates made by De Hollingshead, RPH-CPP since 03/11/2020 12:00 AM    Problem: Diabetes, Hypertension, CKD, Depression     Long-Range Goal: Disease Progression Prevention   Recent Progress: On track  Priority: High  Note:   Current Barriers:  . Unable to independently afford treatment regimen . Unable to independently monitor therapeutic efficacy . Unable to achieve control of diabetes   Pharmacist Clinical Goal(s):  Marland Kitchen Over the next 90 days, patient will verbalize ability to afford treatment regimen. . Over the next 90 days, patient will achieve control of diabetes as evidenced by improvement in A1c through collaboration with PharmD and provider.   Interventions: . 1:1 collaboration with Leone Haven, MD regarding development and update of comprehensive plan of care as evidenced by provider attestation and co-signature . Inter-disciplinary care team collaboration (see longitudinal plan of care) . Comprehensive medication review performed; medication list updated in electronic medical record   Diabetes: . Uncontrolled; current treatment: metformin XR 1000 mg BID, Jardiance 25 mg daily, Trulicity 3 mg weekly- reports he is using up a supply of 3 mg before increasing to 4.5 mg) Basaglar 30 units daily (prefers to split and take 15 units in two  separate shots, Humalog 5-7 units TID w/meals . Approved for patient assistance for Basaglar, Humalog, and Jardiance through 2022 . Provider note from yesterday faxed to [place].  . Noted that different insulin doses reported to PCP yesterday than what he reported to me. Will continue to support appropriate adherence.  . A1c came back at 7.8%, though per CGM report from the last 90 days, estimated GMI closer to 8.6%. Concern for A1c accuracy, and CKD may be impacting hemoglobin production and A1c. Fructosamine ordered today as add on.  Hypertension: . Controlled; current treatment: furosemide 20 mg QAM, losartan 100 mg QAM, metoprolol XL 100 mg QAM, amlodipine 10 mg daily QAM, doxazosin 4 mg QPM . Previously recommend to continue current regimen at this time.  Hyperlipidemia: . Controlled per last lipid panel; current treatment: atorvastatin 40 mg daily . Antiplatelet regimen: aspirin 325 mg daily (hx CVA per chart review) . Previously recommended to continue current regimen at this time.   Depression: . Controlled per patient report, though he endorses significant stress with home management activities (bills, phone services, notes his wife used to manage all of this); current treatment: escitalopram 20 mg daily . Previously recommend to continue current regimen at this time. Will follow for opportunities to suggest therapy support.   Patient Goals/Self-Care Activities . Over the next 90 days, patient will:  - take medications as prescribed check glucose three times daily using CGM, document, and provide at future appointments check blood pressure periodically, document, and provide at future appointments collaborate with provider on medication access solutions  Follow Up Plan: Telephone follow up appointment with  care management team member scheduled for:~ 4 weeks as previously scheduled          Medication Assistance: Trulicity, Engineer, agricultural, Humalog, Jardiance obtained through  [place] and [place] medication assistance program.  Enrollment ends 01/01/21  Patient's preferred pharmacy is:  [id number] [address], [address] [place] [address] Phone: [phone] Fax: [phone]  [facility] [place], [address] [place] [address] Phone: [phone] Fax: [phone]    Care Plan and Follow Up Patient Decision:  Patient agrees to Care Plan and Follow-up.  Plan: Telephone follow up appointment with care management team member scheduled for:  ~ 4 weeks as previously scheduled  Catie Darnelle Maffucci, PharmD, [place], [place] Clinical Pharmacist [employer] at [employer] [phone]

## 2020-03-12 ENCOUNTER — Ambulatory Visit: Payer: Medicare Other | Admitting: Pharmacist

## 2020-03-12 DIAGNOSIS — I10 Essential (primary) hypertension: Secondary | ICD-10-CM

## 2020-03-12 DIAGNOSIS — E1140 Type 2 diabetes mellitus with diabetic neuropathy, unspecified: Secondary | ICD-10-CM

## 2020-03-12 DIAGNOSIS — E785 Hyperlipidemia, unspecified: Secondary | ICD-10-CM

## 2020-03-12 DIAGNOSIS — IMO0002 Reserved for concepts with insufficient information to code with codable children: Secondary | ICD-10-CM

## 2020-03-12 DIAGNOSIS — G629 Polyneuropathy, unspecified: Secondary | ICD-10-CM

## 2020-03-12 NOTE — Patient Instructions (Signed)
Visit Information  PATIENT GOALS: Goals Addressed              This Visit's Progress     Patient Stated   .  Medication Monitoring (pt-stated)        Patient Goals/Self-Care Activities . Over the next 90 days, patient will:  - take medications as prescribed check glucose three times daily using CGM, document, and provide at future appointments check blood pressure periodically, document, and provide at future appointments collaborate with provider on medication access solutions           Patient verbalizes understanding of instructions provided today and agrees to view in [place].   Plan: Telephone follow up appointment with care management team member scheduled for:  ~ 4 weeks  Catie Darnelle Maffucci, PharmD, [place], [place] Clinical Pharmacist [employer] at [employer] [phone]

## 2020-03-12 NOTE — Chronic Care Management (AMB) (Signed)
Chronic Care Management Pharmacy Note  03/12/2020 Name:  Shane Jones MRN:  [id number] DOB:  11/09/1945  Subjective: Shane Jones is an 75 y.o. year old male who is a primary patient of Caryl Bis, Angela Adam, MD.  The CCM team was consulted for assistance with disease management and care coordination needs.    Engaged with patient face to face for care coordination in response to provider referral for pharmacy case management and/or care coordination services.   Consent to Services:  The patient was given information about Chronic Care Management services, agreed to services, and gave verbal consent prior to initiation of services.  Please see initial visit note for detailed documentation.   Patient Care Team: Leone Haven, MD as PCP - General (Family Medicine) De Hollingshead, RPH-CPP as Pharmacist (Pharmacist) Leona Singleton, RN as Case Manager  Recent office visits: None since our last encounter  Recent consult visits: None since our last encounter  Hospital visits: None in previous 6 months  Objective:  Lab Results  Component Value Date   CREATININE 1.65 (H) 03/10/2020   BUN 19 03/10/2020   GFR 39.96 (L) 03/10/2020   NA 140 03/10/2020   K 3.4 (L) 03/10/2020   CALCIUM 9.1 03/10/2020   CO2 27 03/10/2020    Lab Results  Component Value Date/Time   HGBA1C 7.8 (H) 03/10/2020 03:26 PM   HGBA1C 6.6 (A) 12/10/2019 04:17 PM   HGBA1C 13.2 (H) 09/01/2019 03:45 PM   GFR 39.96 (L) 03/10/2020 03:26 PM   GFR 36.59 (L) 09/01/2019 03:45 PM   MICROALBUR 21.2 (H) 09/01/2019 03:45 PM   MICROALBUR 25.5 (H) 07/27/2015 08:11 AM    Last diabetic Eye exam:  Lab Results  Component Value Date/Time   HMDIABEYEEXA No Retinopathy 04/16/2017 12:00 AM    Last diabetic Foot exam:  Lab Results  Component Value Date/Time   HMDIABFOOTEX Normal 07/27/2015 12:00 AM     Lab Results  Component Value Date   CHOL 146 03/10/2020   HDL 30.10 (L) 03/10/2020   LDLCALC 23 08/07/2016    LDLDIRECT 79.0 03/10/2020   TRIG (H) 03/10/2020    402.0 Triglyceride is over 400; calculations on Lipids are invalid.   CHOLHDL 5 03/10/2020    Hepatic Function Latest Ref Rng & Units 09/01/2019 12/26/2018 09/25/2018  Total Protein 6.0 - 8.3 g/dL 6.9 6.5 7.1  Albumin 3.5 - 5.2 g/dL 4.3 4.2 4.5  AST 0 - 37 U/L _0 ALT 0 - 53 U/L _1 Alk Phosphatase 39 - 117 U/L 75 68 67  Total Bilirubin 0.2 - 1.2 mg/dL 1.0 0.9 0.7    Lab Results  Component Value Date/Time   TSH 1.11 12/26/2018 09:30 AM   TSH 0.84 02/17/2011 08:30 AM    CBC Latest Ref Rng & Units 12/26/2018  WBC 4.0 - 10.5 K/uL 6.2  Hemoglobin 13.0 - 17.0 g/dL 16.2  Hematocrit 39.0 - 52.0 % 46.3  Platelets 150.0 - 400.0 K/uL 188.0    Clinical ASCVD: Yes  The 10-year ASCVD risk score Mikey Bussing DC Jr., et al., 2013) is: 60.3%   Values used to calculate the score:     Age: 57 years     Sex: Male     Is Non-Hispanic African American: No     Diabetic: Yes     Tobacco smoker: Yes     Systolic Blood Pressure: 517 mmHg     Is BP treated: Yes     HDL Cholesterol:  30.1 mg/dL     Total Cholesterol: 146 mg/dL    Depression screen [facility] 2/9 03/08/2020 02/10/2020 12/10/2019  Decreased Interest 0 0 0  Down, Depressed, Hopeless 0 0 0  PHQ - 2 Score 0 0 0  Altered sleeping - - -  Tired, decreased energy - - -  Change in appetite - - -  Feeling bad or failure about yourself  - - -  Trouble concentrating - - -  Moving slowly or fidgety/restless - - -  Suicidal thoughts - - -  PHQ-9 Score - - -    Social History   Tobacco Use  Smoking Status Former Smoker  . Quit date: 11/20/1978  . Years since quitting: 41.3  Smokeless Tobacco Never Used   BP Readings from Last 3 Encounters:  03/10/20 140/80  12/10/19 118/70  09/01/19 120/80   Pulse Readings from Last 3 Encounters:  03/10/20 86  12/10/19 69  09/01/19 90   Wt Readings from Last 3 Encounters:  03/10/20 187 lb 12.8 oz (85.2 kg)  02/10/20 182 lb (82.6 kg)   12/10/19 182 lb 8 oz (82.8 kg)    Assessment/Interventions: Review of patient past medical history, allergies, medications, health status, including review of consultants reports, laboratory and other test data, was performed as part of comprehensive evaluation and provision of chronic care management services.   SDOH:  (Social Determinants of Health) assessments and interventions performed: Yes SDOH Interventions   Flowsheet Row Most Recent Value  SDOH Interventions   Financial Strain Interventions Other (Comment)  [manufacturer assisatnce]      CCM Care Plan  Allergies  Allergen Reactions  . Uloric [Febuxostat] Hives    hives    Medications Reviewed Today    Reviewed by Gordy Councilman, CMA (Certified Medical Assistant) on 03/10/20 at 1458  Med List Status: <None>  Medication Order Taking? Sig Documenting Provider Last Dose Status Informant  Accu-Chek FastClix Lancets MISC [id number] Yes USE UP TO FOUR TIMES DAILY AS DIRECTED Leone Haven, MD Taking Active   ACCU-CHEK GUIDE test strip [id number] Yes USE UP TO FOUR TIMES DAILY AS DIRECTED Leone Haven, MD Taking Active   allopurinol (ZYLOPRIM) 100 MG tablet [id number] Yes TAKE 1 TABLET DAILY Leone Haven, MD Taking Active   amLODipine (NORVASC) 10 MG tablet [id number] Yes TAKE 1 TABLET EVERY DAY Leone Haven, MD Taking Active   aspirin 325 MG tablet [id number] Yes Take 325 mg by mouth daily. [provider] Taking Active   atorvastatin (LIPITOR) 40 MG tablet [id number] Yes TAKE 1 TABLET EVERY DAY Leone Haven, MD Taking Active   B Complex Vitamins (VITAMIN-B COMPLEX PO) [id number] Yes Take by mouth. [provider] Taking Active   BETA CAROTENE PO [id number] Yes Take by mouth. [provider] Taking Active   blood glucose meter kit and supplies [id number] Yes Dispense based on patient and insurance preference. Use up to four times daily as directed. (FOR ICD-10 E10.9, E11.9).  Leone Haven, MD Taking Active   Calcium Carbonate-Vitamin D (CALCIUM + D PO) [id number] Yes Take by mouth. [provider] Taking Active   doxazosin (CARDURA) 4 MG tablet [id number] Yes  [provider] Taking Active   Dulaglutide (TRULICITY) 4.5 VV/7.4MO SOPN [id number] Yes Inject 4.5 mg as directed once a week. Leone Haven, MD Taking Active            Med Note [name] Mar 04, 2020  1:18 PM) Using 3  mg   empagliflozin (JARDIANCE) 25 MG TABS tablet [id number] Yes Take 25 mg by mouth daily. Leone Haven, MD Taking Active   escitalopram (LEXAPRO) 20 MG tablet [id number] Yes TAKE 1 TABLET (20 MG) BY MOUTH DAILY Leone Haven, MD Taking Active   furosemide (LASIX) 20 MG tablet [id number] Yes Take one tablet (20 mg total) by mouth daily. Leone Haven, MD Taking Active   gabapentin (NEURONTIN) 400 MG capsule [id number] Yes Take 1 capsule (400 mg total) by mouth 3 (three) times daily. Leone Haven, MD Taking Active   glipiZIDE (GLUCOTROL) 5 MG tablet [id number] Yes Take 1 tablet by mouth 2 (two) times daily. [provider] Taking Active   hydrALAZINE (APRESOLINE) 10 MG tablet [id number] Yes TAKE 1 TABLET BY MOUTH UP TO TWICE DAILY AS NEEDED IF BLOOD PRESSURE> 130/80 Leone Haven, MD Taking Active   Insulin [facility] KWIKPEN) 100 UNIT/ML [id number] Yes Inject 30 Units into the skin daily. Leone Haven, MD Taking Active            Med Note Darnelle Maffucci, Maralyn Sago Mar 04, 2020  1:17 PM) Taking 2 shots of 15 units  insulin lispro (HUMALOG KWIKPEN) 100 UNIT/ML KwikPen [id number] Yes Inject 10 units up to three times daily with meals Leone Haven, MD Taking Active            Med Note Leona Singleton   Mon Mar 08, 2020  1:47 PM) Reports taking 10 units TID with meals if blood sugars >200  Insulin Pen Needle (PEN NEEDLES) 32G X 4 MM MISC [id number] Yes Use to inject insulin up to 4 times daily Leone Haven,  MD Taking Active   losartan (COZAAR) 100 MG tablet [id number] Yes TAKE 1 TABLET EVERY DAY Leone Haven, MD Taking Active   magnesium 30 MG tablet [id number] Yes Take 30 mg by mouth 2 (two) times daily. [provider] Taking Active   metFORMIN (GLUCOPHAGE XR) 500 MG 24 hr tablet [id number] Yes Take 2 tablets (1,000 mg total) by mouth in the morning and at bedtime. Leone Haven, MD Taking Active   metoprolol succinate (TOPROL-XL) 100 MG 24 hr tablet [id number] Yes TAKE 1 TABLET EVERY DAY Leone Haven, MD Taking Active   Multiple Vitamin (MULTIVITAMIN) tablet [id number] Yes Take 1 tablet by mouth daily. [provider] Taking Active   pantoprazole (PROTONIX) 40 MG tablet [id number] Yes Take 1 tablet (40 mg total) by mouth daily. Leone Haven, MD Taking Active   potassium chloride (KLOR-CON) 10 MEQ tablet [id number] Yes TAKE 1 TABLET BY MOUTH DAILY WITH LASIX Leone Haven, MD Taking Active   Zinc Sulfate (ZINC 15 PO) [id number] Yes Take by mouth. [provider] Taking Active           Patient Active Problem List   Diagnosis Date Noted  . Overweight 05/30/2019  . Acute kidney failure ([place]) 01/22/2019  . Benign hypertensive kidney disease with chronic kidney disease 01/22/2019  . Secondary hyperparathyroidism of renal origin ([place]) 01/22/2019  . Stage 3a chronic kidney disease ([place]) 01/22/2019  . Lightheadedness 12/19/2018  . Diarrhea 01/21/2018  . Skin lesions 01/21/2018  . Esophagitis   . Proteinuria 10/10/2017  . Onychomycosis 03/19/2017  . Anxiety and depression 09/19/2016  . Hypokalemia 09/19/2016  . Loss of height 05/01/2016  . Left shoulder pain 01/31/2016  . Prostate cancer screening 09/27/2012  . Sleep apnea 11/22/2011  . Chronic back pain 08/17/2011  .  Hyperlipidemia 08/17/2011  . Gout 08/17/2011  . Type 2 diabetes, uncontrolled, with neuropathy ([place]) 02/20/2011  . Neuropathy ([place]) 02/17/2011  . Hypertension 10/13/2010     Immunization History  Administered Date(s) Administered  . Fluad Quad(high Dose 65+) 09/25/2019  . Influenza Split 10/13/2010, 11/22/2011  . Influenza, High Dose Seasonal PF 11/01/2015, 10/30/2016, 10/10/2017  . Influenza,inj,Quad PF,6+ Mos 09/27/2012, 10/20/2013, 09/28/2014  . Moderna Sars-Covid-2 Vaccination 03/20/2019, 04/17/2019  . Pneumococcal Conjugate-13 04/14/2013  . Pneumococcal Polysaccharide-23 08/17/2011  . Tdap 08/16/2009    Conditions to be addressed/monitored:  Hypertension, Hyperlipidemia, Diabetes and Chronic Kidney Disease  Care Plan : Medication Management  Updates made by De Hollingshead, RPH-CPP since 03/12/2020 12:00 AM    Problem: Diabetes, Hypertension, CKD, Depression     Long-Range Goal: Disease Progression Prevention   Recent Progress: On track  Priority: High  Note:   Current Barriers:  . Unable to independently afford treatment regimen . Unable to independently monitor therapeutic efficacy . Unable to achieve control of diabetes   Pharmacist Clinical Goal(s):  Marland Kitchen Over the next 90 days, patient will verbalize ability to afford treatment regimen. . Over the next 90 days, patient will achieve control of diabetes as evidenced by improvement in A1c through collaboration with PharmD and provider.   Interventions: . 1:1 collaboration with Leone Haven, MD regarding development and update of comprehensive plan of care as evidenced by provider attestation and co-signature . Inter-disciplinary care team collaboration (see longitudinal plan of care) . Comprehensive medication review performed; medication list updated in electronic medical record  Diabetes: . Uncontrolled; current treatment: metformin XR 1000 mg BID, Jardiance 25 mg daily, Trulicity 4.5 mg - just increased, was completing 3 mg supply first; Basaglar 30 units daily (prefers to split and take 15 units in two separate shots- though reported to Dr. Caryl Bis yesterday that he  sometimes will take an extra dose of 30 units in the evenings if BG higher) Humalog 5-7 units TID w/meals . Approved for patient assistance for Basaglar, Humalog, and Jardiance through 2022 . He arrived at the office today because his sensor stopped working last night. . Provided with new sensor sample and tegaderm strips to apply overtop to protect. [facility] is processing provider's visit now.  . Awaiting fructosamine result. Educated to stop taking a second dose of Basaglar later in the day PRN as this is inappropriate use of basal insulin. Continue 30 units daily, Humalog 7 units with meals, Trulicity 4.5 mg weekly, metformin XR 1000 mg BID, Jardiance 25 mg daily. Will use fructosamine result to determine next steps.   Hypertension: . Controlled; current treatment: furosemide 20 mg QAM, losartan 100 mg QAM, metoprolol XL 100 mg QAM, amlodipine 10 mg daily QAM, doxazosin 4 mg QPM; encouraged by PCP to restart hydralazine 10 mg BID at visit Wednesady . Recommend to continue current regimen at this time.  Hyperlipidemia: . Controlled per last lipid panel; current treatment: atorvastatin 40 mg daily . Antiplatelet regimen: aspirin 325 mg daily (hx CVA per chart review) . Previously recommended to continue current regimen at this time.   Depression: . Controlled per patient report, though he endorses significant stress with home management activities (bills, phone services, notes his wife used to manage all of this); current treatment: escitalopram 20 mg daily . Previously recommend to continue current regimen at this time.   Patient Goals/Self-Care Activities . Over the next 90 days, patient will:  - take medications as prescribed check glucose three times daily using CGM, document,  and provide at future appointments check blood pressure periodically, document, and provide at future appointments collaborate with provider on medication access solutions  Follow Up Plan: Telephone follow up  appointment with care management team member scheduled for:~ 4 weeks as previously scheduled          Medication Assistance: [place], Trulicity, Humalog, Jardiance obtained through [place] medication assistance program.  Enrollment ends 01/01/21  Patient's preferred pharmacy is:  [id number] [address], [place] [address] Phone: [phone] Fax: [facility] [address], [place] [address] Phone: [phone] Fax: [phone]  Care Plan and Follow Up Patient Decision:  Patient agrees to Care Plan and Follow-up.  Plan: Telephone follow up appointment with care management team member scheduled for:  ~ 4 weeks  Catie Darnelle Maffucci, PharmD, [place], [place] Clinical Pharmacist [employer] [facility] [employer] [phone]

## 2020-03-14 LAB — FRUCTOSAMINE: Fructosamine: 399 umol/L — ABNORMAL HIGH (ref 205–285)

## 2020-03-22 ENCOUNTER — Ambulatory Visit: Payer: Medicare Other | Admitting: Pharmacist

## 2020-03-22 DIAGNOSIS — F32A Depression, unspecified: Secondary | ICD-10-CM

## 2020-03-22 DIAGNOSIS — IMO0002 Reserved for concepts with insufficient information to code with codable children: Secondary | ICD-10-CM

## 2020-03-22 DIAGNOSIS — E785 Hyperlipidemia, unspecified: Secondary | ICD-10-CM

## 2020-03-22 DIAGNOSIS — F419 Anxiety disorder, unspecified: Secondary | ICD-10-CM

## 2020-03-22 DIAGNOSIS — E1140 Type 2 diabetes mellitus with diabetic neuropathy, unspecified: Secondary | ICD-10-CM

## 2020-03-22 DIAGNOSIS — I10 Essential (primary) hypertension: Secondary | ICD-10-CM

## 2020-03-22 NOTE — Patient Instructions (Signed)
Visit Information  PATIENT GOALS: Goals Addressed              This Visit's Progress     Patient Stated   .  Medication Monitoring (pt-stated)        Patient Goals/Self-Care Activities . Over the next 90 days, patient will:  - take medications as prescribed check glucose three times daily using CGM, document, and provide at future appointments check blood pressure periodically, document, and provide at future appointments collaborate with provider on medication access solutions            Patient verbalizes understanding of instructions provided today and agrees to view in [place].  Plan: Face to Face appointment with care management team member scheduled for: ~ 4 weeks  Catie Darnelle Maffucci, PharmD, [place], [place] Clinical Pharmacist [employer] [facility] [employer] [phone]

## 2020-03-22 NOTE — Chronic Care Management (AMB) (Signed)
Chronic Care Management Pharmacy Note  03/22/2020 Name:  Shane Jones MRN:  [id number] DOB:  1945-01-18  Subjective: Shane Jones is an 75 y.o. year old male who is a primary patient of Caryl Bis, Angela Adam, MD.  The CCM team was consulted for assistance with disease management and care coordination needs.    Engaged with patient by telephone for device access f/u in response to provider referral for pharmacy case management and/or care coordination services.   Consent to Services:  The patient was given information about Chronic Care Management services, agreed to services, and gave verbal consent prior to initiation of services.  Please see initial visit note for detailed documentation.   Patient Care Team: Leone Haven, MD as PCP - General (Family Medicine) De Hollingshead, [place] as Pharmacist (Pharmacist) Leona Singleton, RN as Case Manager  Recent office visits: None since our last call  Recent consult visits: None since our last call  Hospital visits: None in previous 6 months  Objective:  Lab Results  Component Value Date   CREATININE 1.65 (H) 03/10/2020   CREATININE 1.81 (H) 09/01/2019   CREATININE 1.85 (H) 01/14/2019    Lab Results  Component Value Date   HGBA1C 7.8 (H) 03/10/2020   Last diabetic Eye exam:  Lab Results  Component Value Date/Time   HMDIABEYEEXA No Retinopathy 04/16/2017 12:00 AM    Last diabetic Foot exam:  Lab Results  Component Value Date/Time   HMDIABFOOTEX Normal 07/27/2015 12:00 AM        Component Value Date/Time   CHOL 146 03/10/2020 1526   TRIG (H) 03/10/2020 1526    402.0 Triglyceride is over 400; calculations on Lipids are invalid.   HDL 30.10 (L) 03/10/2020 1526   CHOLHDL 5 03/10/2020 1526   VLDL 77.6 (H) 09/01/2019 1545   LDLCALC 23 08/07/2016 1156   LDLDIRECT 79.0 03/10/2020 1526    Hepatic Function Latest Ref Rng & Units 09/01/2019 12/26/2018 09/25/2018  Total Protein 6.0 - 8.3 g/dL 6.9 6.5 7.1   Albumin 3.5 - 5.2 g/dL 4.3 4.2 4.5  AST 0 - 37 U/L _0 ALT 0 - 53 U/L _1 Alk Phosphatase 39 - 117 U/L 75 68 67  Total Bilirubin 0.2 - 1.2 mg/dL 1.0 0.9 0.7    Lab Results  Component Value Date/Time   TSH 1.11 12/26/2018 09:30 AM   TSH 0.84 02/17/2011 08:30 AM    CBC Latest Ref Rng & Units 12/26/2018  WBC 4.0 - 10.5 K/uL 6.2  Hemoglobin 13.0 - 17.0 g/dL 16.2  Hematocrit 39.0 - 52.0 % 46.3  Platelets 150.0 - 400.0 K/uL 188.0   Clinical ASCVD: No  The 10-year ASCVD risk score Mikey Bussing DC Jr., et al., 2013) is: 61.9%   Values used to calculate the score:     Age: 81 years     Sex: Male     Is Non-Hispanic African American: No     Diabetic: Yes     Tobacco smoker: Yes     Systolic Blood Pressure: 573 mmHg     Is BP treated: Yes     HDL Cholesterol: 30.1 mg/dL     Total Cholesterol: 146 mg/dL    Other: (CHADS2VASc if Afib, PHQ9 if depression, MMRC or CAT for COPD, ACT, DEXA)  Social History   Tobacco Use  Smoking Status Former Smoker   Quit date: 11/20/1978   Years since quitting: 41.3  Smokeless Tobacco Never Used   BP  Readings from Last 3 Encounters:  03/10/20 140/80  12/10/19 118/70  09/01/19 120/80   Pulse Readings from Last 3 Encounters:  03/10/20 86  12/10/19 69  09/01/19 90   Wt Readings from Last 3 Encounters:  03/10/20 187 lb 12.8 oz (85.2 kg)  02/10/20 182 lb (82.6 kg)  12/10/19 182 lb 8 oz (82.8 kg)    Assessment: Review of patient past medical history, allergies, medications, health status, including review of consultants reports, laboratory and other test data, was performed as part of comprehensive evaluation and provision of chronic care management services.   SDOH:  (Social Determinants of Health) assessments and interventions performed: none today   CCM Care Plan  Allergies  Allergen Reactions   Uloric [Febuxostat] Hives    hives    Medications Reviewed Today    Reviewed by Gordy Councilman, CMA (Certified Medical  Assistant) on 03/10/20 at 1458  Med List Status: <None>  Medication Order Taking? Sig Documenting Provider Last Dose Status Informant  Accu-Chek FastClix Lancets MISC [id number] Yes USE UP TO FOUR TIMES DAILY AS DIRECTED Leone Haven, MD Taking Active   ACCU-CHEK GUIDE test strip [id number] Yes USE UP TO FOUR TIMES DAILY AS DIRECTED Leone Haven, MD Taking Active   allopurinol (ZYLOPRIM) 100 MG tablet [id number] Yes TAKE 1 TABLET DAILY Leone Haven, MD Taking Active   amLODipine (NORVASC) 10 MG tablet [id number] Yes TAKE 1 TABLET EVERY DAY Leone Haven, MD Taking Active   aspirin 325 MG tablet [id number] Yes Take 325 mg by mouth daily. [provider] Taking Active   atorvastatin (LIPITOR) 40 MG tablet [id number] Yes TAKE 1 TABLET EVERY DAY Leone Haven, MD Taking Active   B Complex Vitamins (VITAMIN-B COMPLEX PO) [id number] Yes Take by mouth. [provider] Taking Active   BETA CAROTENE PO [id number] Yes Take by mouth. [provider] Taking Active   blood glucose meter kit and supplies [id number] Yes Dispense based on patient and insurance preference. Use up to four times daily as directed. (FOR ICD-10 E10.9, E11.9). Leone Haven, MD Taking Active   Calcium Carbonate-Vitamin D (CALCIUM + D PO) [id number] Yes Take by mouth. [provider] Taking Active   doxazosin (CARDURA) 4 MG tablet [id number] Yes  [provider] Taking Active   Dulaglutide (TRULICITY) 4.5 YW/7.3XT SOPN [id number] Yes Inject 4.5 mg as directed once a week. Leone Haven, MD Taking Active            Med Note [name] Mar 04, 2020  1:18 PM) Using 3 mg   empagliflozin (JARDIANCE) 25 MG TABS tablet [id number] Yes Take 25 mg by mouth daily. Leone Haven, MD Taking Active   escitalopram (LEXAPRO) 20 MG tablet [id number] Yes TAKE 1 TABLET (20 MG) BY MOUTH DAILY Leone Haven, MD Taking Active   furosemide (LASIX) 20 MG tablet  [id number] Yes Take one tablet (20 mg total) by mouth daily. Leone Haven, MD Taking Active   gabapentin (NEURONTIN) 400 MG capsule [id number] Yes Take 1 capsule (400 mg total) by mouth 3 (three) times daily. Leone Haven, MD Taking Active   glipiZIDE (GLUCOTROL) 5 MG tablet [id number] Yes Take 1 tablet by mouth 2 (two) times daily. [provider] Taking Active   hydrALAZINE (APRESOLINE) 10 MG tablet [id number] Yes TAKE 1 TABLET BY MOUTH UP TO TWICE DAILY AS NEEDED IF BLOOD PRESSURE> 130/80 Leone Haven, MD Taking Active   Insulin Glargine [name]  KWIKPEN) 100 UNIT/ML [id number] Yes Inject 30 Units into the skin daily. Leone Haven, MD Taking Active            Med Note Darnelle Maffucci, Maralyn Sago Mar 04, 2020  1:17 PM) Taking 2 shots of 15 units  insulin lispro (HUMALOG KWIKPEN) 100 UNIT/ML KwikPen [id number] Yes Inject 10 units up to three times daily with meals Leone Haven, MD Taking Active            Med Note Leona Singleton   Mon Mar 08, 2020  1:47 PM) Reports taking 10 units TID with meals if blood sugars >200  Insulin Pen Needle (PEN NEEDLES) 32G X 4 MM MISC [id number] Yes Use to inject insulin up to 4 times daily Leone Haven, MD Taking Active   losartan (COZAAR) 100 MG tablet [id number] Yes TAKE 1 TABLET EVERY DAY Leone Haven, MD Taking Active   magnesium 30 MG tablet [id number] Yes Take 30 mg by mouth 2 (two) times daily. [provider] Taking Active   metFORMIN (GLUCOPHAGE XR) 500 MG 24 hr tablet [id number] Yes Take 2 tablets (1,000 mg total) by mouth in the morning and at bedtime. Leone Haven, MD Taking Active   metoprolol succinate (TOPROL-XL) 100 MG 24 hr tablet [id number] Yes TAKE 1 TABLET EVERY DAY Leone Haven, MD Taking Active   Multiple Vitamin (MULTIVITAMIN) tablet [id number] Yes Take 1 tablet by mouth daily. [provider] Taking Active   pantoprazole (PROTONIX) 40 MG tablet [id number] Yes Take 1 tablet  (40 mg total) by mouth daily. Leone Haven, MD Taking Active   potassium chloride (KLOR-CON) 10 MEQ tablet [id number] Yes TAKE 1 TABLET BY MOUTH DAILY WITH LASIX Leone Haven, MD Taking Active   Zinc Sulfate (ZINC 15 PO) [id number] Yes Take by mouth. [provider] Taking Active           Patient Active Problem List   Diagnosis Date Noted   Overweight 05/30/2019   Acute kidney failure ([facility]) 01/22/2019   Benign hypertensive kidney disease with chronic kidney disease 01/22/2019   Secondary hyperparathyroidism of renal origin ([facility]) 01/22/2019   Stage 3a chronic kidney disease ([facility]) 01/22/2019   Lightheadedness 12/19/2018   Diarrhea 01/21/2018   Skin lesions 01/21/2018   Esophagitis    Proteinuria 10/10/2017   Onychomycosis 03/19/2017   Anxiety and depression 09/19/2016   Hypokalemia 09/19/2016   Loss of height 05/01/2016   Left shoulder pain 01/31/2016   Prostate cancer screening 09/27/2012   Sleep apnea 11/22/2011   Chronic back pain 08/17/2011   Hyperlipidemia 08/17/2011   Gout 08/17/2011   Type 2 diabetes, uncontrolled, with neuropathy ([facility]) 02/20/2011   Neuropathy ([facility]) 02/17/2011   Hypertension 10/13/2010    Immunization History  Administered Date(s) Administered   Fluad Quad(high Dose 65+) 09/25/2019   Influenza Split 10/13/2010, 11/22/2011   Influenza, High Dose Seasonal PF 11/01/2015, 10/30/2016, 10/10/2017   Influenza,inj,Quad PF,6+ Mos 09/27/2012, 10/20/2013, 09/28/2014   Moderna Sars-Covid-2 Vaccination 03/20/2019, 04/17/2019   Pneumococcal Conjugate-13 04/14/2013   Pneumococcal Polysaccharide-23 08/17/2011   Tdap 08/16/2009    Conditions to be addressed/monitored: HTN, HLD, DMII and Depression  Care Plan : Medication Management  Updates made by De Hollingshead, RPH-CPP since 03/22/2020 12:00 AM    Problem: Diabetes, Hypertension, CKD, Depression     Long-Range Goal: Disease Progression Prevention    This Visit's Progress: On track  Recent Progress: On track  Priority: High  Note:   Current  Barriers:   Unable to independently afford treatment regimen  Unable to independently monitor therapeutic efficacy  Unable to achieve control of diabetes   Pharmacist Clinical Goal(s):   Over the next 90 days, patient will verbalize ability to afford treatment regimen.  Over the next 90 days, patient will achieve control of diabetes as evidenced by improvement in A1c through collaboration with PharmD and provider.   Interventions:  1:1 collaboration with Leone Haven, MD regarding development and update of comprehensive plan of care as evidenced by provider attestation and co-signature  Inter-disciplinary care team collaboration (see longitudinal plan of care)  Comprehensive medication review performed; medication list updated in electronic medical record  Diabetes:  Uncontrolled; current treatment: metformin XR 1000 mg BID, Jardiance 25 mg daily, Trulicity 4.5 mg - just increased, was completing 3 mg supply first; Basaglar 30 units daily (prefers to split and take 15 units in two separate shots, Humalog 5-7 units TID w/meals  Approved for patient assistance for Basaglar, Humalog, Trulicity, and Jardiance through 2022  Current glucose readings: Date of Download: 3/8-3/21/22 % Time CGM is active: 58% Average Glucose: 193 mg/dL Glucose Management Indicator: 7.9% Glucose Variability: 26.9 (goal <36%) Time in Goal:  - Time in range 70-180: 44% - Time above range: 54% - Time below range: 0%  Contacted [facility]. They approved and shipped the patient's order for [place] device. Should arrive in the next several days  Contacted patient to review the above. Improving glycemic control since last appointment. He requests to defer medication changes at this time. Continue current regimen, will see patient in office with his PCP in ~ 4 weeks  Reviewed upcoming RN CM visit    Hypertension:  Controlled; current treatment: furosemide 20 mg QAM, losartan 100 mg QAM, metoprolol XL 100 mg QAM, amlodipine 10 mg daily QAM, doxazosin 4 mg QPM; encouraged by PCP to restart hydralazine 10 mg BID at visit Wednesady  Previously recommended to continue current regimen at this time.  Hyperlipidemia:  Controlled per last lipid panel; current treatment: atorvastatin 40 mg daily  Antiplatelet regimen: aspirin 325 mg daily (hx CVA per chart review)  Previously recommended to continue current regimen at this time.   Depression:  Controlled per patient report, though he endorses significant stress with home management activities (bills, phone services, notes his wife used to manage all of this); current treatment: escitalopram 20 mg daily  Previously recommend to continue current regimen at this time.   Patient Goals/Self-Care Activities  Over the next 90 days, patient will:  - take medications as prescribed check glucose three times daily using CGM, document, and provide at future appointments check blood pressure periodically, document, and provide at future appointments collaborate with provider on medication access solutions  Follow Up Plan: ~ Face to Face appointment with care management team member scheduled for: ~ 4 weeks w/ PCP visit        Medication Assistance: Basaglar, Trulicity, Humalog, Jardiance obtained through [facility] medication assistance program.  Enrollment ends 01/01/21 Patient's preferred pharmacy is:  [id number] [address], [place] [address] Phone: [phone] Fax: [facility] [place], [place] [address] Phone: [phone] Fax: [phone]   Follow Up:  Patient agrees to Care Plan and Follow-up.  Plan: Face to Face appointment with care  management team  member scheduled for: ~ 4 weeks  Catie Darnelle Maffucci, PharmD, [place], [place] Clinical Pharmacist [employer] [facility]

## 2020-03-23 ENCOUNTER — Other Ambulatory Visit: Payer: Self-pay | Admitting: Family Medicine

## 2020-03-23 ENCOUNTER — Telehealth: Payer: Self-pay

## 2020-03-23 DIAGNOSIS — E785 Hyperlipidemia, unspecified: Secondary | ICD-10-CM

## 2020-03-23 MED ORDER — ATORVASTATIN CALCIUM 80 MG PO TABS
ORAL_TABLET | ORAL | 3 refills | Status: DC
Start: 2020-03-23 — End: 2020-09-13

## 2020-03-23 NOTE — Telephone Encounter (Signed)
I called and spoke with the patient and informed him of the lab results.  Nina,cma

## 2020-03-23 NOTE — Telephone Encounter (Signed)
Pt returned your call about lab results

## 2020-03-29 ENCOUNTER — Telehealth: Payer: Medicare Other

## 2020-03-29 ENCOUNTER — Telehealth: Payer: Self-pay | Admitting: *Deleted

## 2020-03-29 NOTE — Telephone Encounter (Signed)
  Chronic Care Management   Outreach Note  03/29/2020 Name: ANVAY TENNIS MRN: [id number] DOB: 12-11-1945  Referred by: Leone Haven, MD Reason for referral : Chronic Care Management (DM, HTN)   An unsuccessful telephone outreach was attempted today. The patient was referred to the case management team for assistance with care management and care coordination.   Follow Up Plan: RNCM will seek assistance from Care Guides to reschedule this appointment within the next 30 days.  Hubert Azure RN, MSN RN Care Management Coordinator [place] [phone] [email or web address]

## 2020-03-31 ENCOUNTER — Telehealth: Payer: Self-pay

## 2020-03-31 NOTE — Chronic Care Management (AMB) (Signed)
  Care Management   Note  03/31/2020 Name: Shane Jones MRN: [id number] DOB: 1945-04-30  Shane Jones is a 75 y.o. year old male who is a primary care patient of Leone Haven, MD and is actively engaged with the care management team. I reached out to Irean Hong by phone today to assist with re-scheduling an initial visit with the RN Case Manager  Follow up plan: Unsuccessful telephone outreach attempt made. A HIPAA compliant phone message was left for the patient providing contact information and requesting a return call.  The care management team will reach out to the patient again over the next 7 days.  If patient returns call to provider office, please advise to call [facility]  at [place], [place], [place],  [address] Direct Dial: [phone] [email or web address]

## 2020-04-07 NOTE — Chronic Care Management (AMB) (Signed)
  Care Management   Note  04/07/2020 Name: NAKOA GANUS MRN: [id number] DOB: 03-10-1945  OWAIS PRUETT is a 75 y.o. year old male who is a primary care patient of Leone Haven, MD and is actively engaged with the care management team. I reached out to Irean Hong by phone today to assist with re-scheduling a follow up visit with the RN Case Manager  Follow up plan: Telephone appointment with care management team member scheduled for:04/26/2020  Noreene Larsson, [place], [place], [place] [address] Direct Dial: [phone] [email or web address]: [email or web address]

## 2020-04-07 NOTE — Telephone Encounter (Signed)
Pt has been rescheduled. 

## 2020-04-16 ENCOUNTER — Telehealth: Payer: Medicare Other

## 2020-04-18 NOTE — Progress Notes (Deleted)
FYI - Fructosamine 399 (high) - corresponds with an A1c ~ 8.4%  Atorvastatin increased to 80 mg  LV w/Catie 3/21 -CGM   Plan 1. CGM sugars -- need for insulin increase 2. Taking hydralazine  3. Started atorv 80

## 2020-04-19 ENCOUNTER — Telehealth: Payer: Self-pay | Admitting: Pharmacist

## 2020-04-19 ENCOUNTER — Ambulatory Visit: Payer: Medicare Other

## 2020-04-19 ENCOUNTER — Ambulatory Visit: Payer: Medicare Other | Admitting: Family Medicine

## 2020-04-19 NOTE — Telephone Encounter (Signed)
Patient has been rescheduled.

## 2020-04-19 NOTE — Telephone Encounter (Signed)
  Chronic Care Management   Note  04/19/2020 Name: Shane Jones MRN: [id number] DOB: 1945/11/26   Received message from desk that patient called to cancel in person appointments today. His PCP appointment was rescheduled for tomorrow. My schedule is full tomorrow. I will collaborate w/ Care Guide to outreach to schedule follow up with me in late May.   Catie Darnelle Maffucci, PharmD, [place], [place] Clinical Pharmacist [employer] at [place]

## 2020-04-20 ENCOUNTER — Ambulatory Visit: Payer: Medicare Other | Admitting: Family Medicine

## 2020-04-26 ENCOUNTER — Ambulatory Visit (INDEPENDENT_AMBULATORY_CARE_PROVIDER_SITE_OTHER): Payer: Medicare Other | Admitting: *Deleted

## 2020-04-26 DIAGNOSIS — E1165 Type 2 diabetes mellitus with hyperglycemia: Secondary | ICD-10-CM

## 2020-04-26 DIAGNOSIS — IMO0002 Reserved for concepts with insufficient information to code with codable children: Secondary | ICD-10-CM

## 2020-04-26 DIAGNOSIS — I10 Essential (primary) hypertension: Secondary | ICD-10-CM

## 2020-04-26 DIAGNOSIS — E1140 Type 2 diabetes mellitus with diabetic neuropathy, unspecified: Secondary | ICD-10-CM | POA: Diagnosis not present

## 2020-04-26 NOTE — Chronic Care Management (AMB) (Signed)
Chronic Care Management   CCM RN Visit Note  04/26/2020 Name: Shane Jones MRN: [id number] DOB: Sep 09, 1945  Subjective: Shane Jones is a 75 y.o. year old male who is a primary care patient of Caryl Bis, Angela Adam, MD. The care management team was consulted for assistance with disease management and care coordination needs.    Engaged with patient by telephone for follow up visit in response to provider referral for case management and/or care coordination services.   Consent to Services:  The patient was given information about Chronic Care Management services, agreed to services, and gave verbal consent prior to initiation of services.  Please see initial visit note for detailed documentation.   Patient agreed to services and verbal consent obtained.   Assessment: Review of patient past medical history, allergies, medications, health status, including review of consultants reports, laboratory and other test data, was performed as part of comprehensive evaluation and provision of chronic care management services.   SDOH (Social Determinants of Health) assessments and interventions performed:    CCM Care Plan  Allergies  Allergen Reactions  . Uloric [Febuxostat] Hives    hives    Outpatient Encounter Medications as of 04/26/2020  Medication Sig Note  . allopurinol (ZYLOPRIM) 100 MG tablet TAKE 1 TABLET DAILY   . Dulaglutide (TRULICITY) 4.5 XH/3.7JI SOPN Inject 4.5 mg as directed once a week. 03/04/2020: Using 3 mg   . empagliflozin (JARDIANCE) 25 MG TABS tablet Take 25 mg by mouth daily.   Marland Kitchen glipiZIDE (GLUCOTROL) 5 MG tablet Take 1 tablet by mouth 2 (two) times daily.   . Insulin Glargine (BASAGLAR KWIKPEN) 100 UNIT/ML Inject 30 Units into the skin daily. 03/04/2020: Taking 2 shots of 15 units  . insulin lispro (HUMALOG KWIKPEN) 100 UNIT/ML KwikPen Inject 10 units up to three times daily with meals 03/08/2020: Reports taking 10 units TID with meals if blood sugars >200  . metFORMIN  (GLUCOPHAGE XR) 500 MG 24 hr tablet Take 2 tablets (1,000 mg total) by mouth in the morning and at bedtime.   . Accu-Chek FastClix Lancets MISC USE UP TO FOUR TIMES DAILY AS DIRECTED   . ACCU-CHEK GUIDE test strip USE UP TO FOUR TIMES DAILY AS DIRECTED   . amLODipine (NORVASC) 10 MG tablet TAKE 1 TABLET EVERY DAY   . aspirin 325 MG tablet Take 325 mg by mouth daily.   Marland Kitchen atorvastatin (LIPITOR) 80 MG tablet TAKE 1 TABLET EVERY DAY   . B Complex Vitamins (VITAMIN-B COMPLEX PO) Take by mouth.   Marland Kitchen BETA CAROTENE PO Take by mouth.   . blood glucose meter kit and supplies Dispense based on patient and insurance preference. Use up to four times daily as directed. (FOR ICD-10 E10.9, E11.9).   . Calcium Carbonate-Vitamin D (CALCIUM + D PO) Take by mouth.   . doxazosin (CARDURA) 4 MG tablet    . escitalopram (LEXAPRO) 20 MG tablet TAKE 1 TABLET (20 MG) BY MOUTH DAILY   . furosemide (LASIX) 20 MG tablet Take one tablet (20 mg total) by mouth daily.   Marland Kitchen gabapentin (NEURONTIN) 400 MG capsule Take 1 capsule (400 mg total) by mouth 3 (three) times daily.   . hydrALAZINE (APRESOLINE) 10 MG tablet Take 1 tablet (10 mg total) by mouth in the morning and at bedtime.   . Insulin Pen Needle (PEN NEEDLES) 32G X 4 MM MISC Use to inject insulin up to 4 times daily   . losartan (COZAAR) 100 MG tablet TAKE 1 TABLET EVERY  DAY   . magnesium 30 MG tablet Take 30 mg by mouth 2 (two) times daily.   . metoprolol succinate (TOPROL-XL) 100 MG 24 hr tablet TAKE 1 TABLET EVERY DAY   . Multiple Vitamin (MULTIVITAMIN) tablet Take 1 tablet by mouth daily.   . pantoprazole (PROTONIX) 40 MG tablet Take 1 tablet (40 mg total) by mouth daily.   . potassium chloride (KLOR-CON) 10 MEQ tablet TAKE 1 TABLET BY MOUTH DAILY WITH LASIX   . Zinc Sulfate (ZINC 15 PO) Take by mouth.    No facility-administered encounter medications on file as of 04/26/2020.    Patient Active Problem List   Diagnosis Date Noted  . Overweight 05/30/2019  .  Acute kidney failure ([place]) 01/22/2019  . Benign hypertensive kidney disease with chronic kidney disease 01/22/2019  . Secondary hyperparathyroidism of renal origin ([place]) 01/22/2019  . Stage 3a chronic kidney disease ([place]) 01/22/2019  . Lightheadedness 12/19/2018  . Diarrhea 01/21/2018  . Skin lesions 01/21/2018  . Esophagitis   . Proteinuria 10/10/2017  . Onychomycosis 03/19/2017  . Anxiety and depression 09/19/2016  . Hypokalemia 09/19/2016  . Loss of height 05/01/2016  . Left shoulder pain 01/31/2016  . Prostate cancer screening 09/27/2012  . Sleep apnea 11/22/2011  . Chronic back pain 08/17/2011  . Hyperlipidemia 08/17/2011  . Gout 08/17/2011  . Type 2 diabetes, uncontrolled, with neuropathy ([place]) 02/20/2011  . Neuropathy ([place]) 02/17/2011  . Hypertension 10/13/2010    Conditions to be addressed/monitored:HTN and DMII  Care Plan : Diabetes Type 2 (Adult)  Updates made by Leona Singleton, RN since 04/26/2020 12:00 AM  Problem: Diabetes Management   Priority: Medium  Long-Range Goal: Patient will report decrease in Hgb A1C by 0.2 points within the next 90 days   Start Date: 03/08/2020  Expected End Date: 08/31/2020  This Visit's Progress: Not on track  Priority: Medium  Objective:  Lab Results  Component Value Date   HGBA1C 7.8 (H) 03/10/2020 .   Lab Results  Component Value Date   CREATININE 1.65 (H) 03/10/2020   CREATININE 1.81 (H) 09/01/2019   CREATININE 1.85 (H) 01/14/2019   Current Barriers:  Marland Kitchen Knowledge Deficits related to basic Diabetes pathophysiology and self care/management as evidenced by patient reporting his Libre sensors and meter is not working and has not been for the past few weeks.  Reports he has not been able to check his blood sugars lately.  Latest Hgb A1C increased to 7.8.  Patient reporting he has gout in his right ankle that had been greatly limiting his ability to walk.  States he was treating his gout with "cherry juice".  Can now put pressure  on his ankle and can ambulate.  Case Manager Clinical Goal(s):  . patient will demonstrate improved adherence to prescribed treatment plan for diabetes self care/management as evidenced by: at least 4 times a day monitoring and recording of CBG, adherence to ADA/ carb modified diet, and adherence to prescribed medication regimen Interventions:  . Collaboration with Leone Haven, MD regarding development and update of comprehensive plan of care as evidenced by provider attestation and co-signature . Inter-disciplinary care team collaboration (see longitudinal plan of care) . Provided education to patient about basic DM disease process; sending Living Well with Diabetes Educational Packet . Reviewed medications with patient and discussed importance of medication adherence . Discussed plans with patient for ongoing care management follow up and provided patient with direct contact information for care management team . Provided patient with education related  to hypo and hyperglycemia and importance of correct treatment; discussed dangers of hypo and hyperglycemia . Reviewed scheduled/upcoming provider appointments including: Dr. Caryl Bis on 05/05/2020 . Advised patient, providing education and rationale, to check CBG at least 4 times a day and record, calling primary care provider of CCM Pharmacist for findings outside established parameters.   . Encouraged to continue to work with CCM Pharmacist . Quality of sleep assessed . Discussed neuropathy in feet and monitoring pain with increases in blood sugars . Instructed to contact manufacturer as soon as possible to obtain new meter/sensors to replace the ones that have not been working . Discussed how important it is to monitor blood sugars . Discussed gout flare and treatment and encouraged patient to discuss with provider . Encouraged patient to review education on diabetic diet previously sent . Discussed increasing Hgb A1C and ways to help  reduce, healthier meal and drink options . Discussed importance of annual diabetic eye exam and encouraged patient to schedule as soon as possible Patient Goals/Self-Care Activities: . Check blood sugar at least 4 times a day . Check blood sugar if I feel it is too high or too low . Take the blood sugar log and meter to all doctor visits . Try to limit carbohydrates in diet (review diet education mailed) . Take medications as prescribed . Contact manufacturer for new sensors to replace the ones not working . Change meter/sensor as soon as possible to check your blood sugars . Schedule Eye Exam in the next month Follow Up Plan: The care management team will reach out to the patient again over the next 30 business days.    Care Plan : Hypertension (Adult)  Updates made by Leona Singleton, RN since 04/26/2020 12:00 AM  Problem: Hypertension (Hypertension)   Priority: Medium  Long-Range Goal: Hypertension Monitored   Start Date: 03/08/2020  Expected End Date: 11/01/2020  This Visit's Progress: On track  Priority: Medium  Current Barriers:  Marland Kitchen Knowledge Deficits related to basic understanding of hypertension pathophysiology and self care management.  Patient reporting he monitors blood pressures daily 145/86 today, and with ranges of 110-140/70-80's.  Denies any significant hypo or hypertensive episodes.   Nurse Case Manager Clinical Goal(s):  Marland Kitchen Over the next 90 days, patient will verbalize understanding of plan for hypertension management . Over the next 90 days, patient will demonstrate improved adherence to prescribed treatment plan for hypertension as evidenced by taking all medications as prescribed, monitoring and recording blood pressure as directed, adhering to low sodium/DASH diet Interventions:  . Collaboration with Leone Haven, MD regarding development and update of comprehensive plan of care as evidenced by provider attestation and co-signature . Inter-disciplinary care team  collaboration (see longitudinal plan of care) . Evaluation of current treatment plan related to hypertension self management and patient's adherence to plan as established by provider. . Provided education to patient re: stroke prevention, s/s of heart attack and stroke, DASH diet, complications of uncontrolled blood pressure . Reviewed medications with patient and discussed importance of compliance . Discussed plans with patient for ongoing care management follow up and provided patient with direct contact information for care management team . Advised patient, providing education and rationale, to monitor blood pressure daily and record, calling PCP for findings outside established parameters.  . Reviewed scheduled/upcoming provider appointments including:  Dr. Caryl Bis on 05/05/2020 . Blood pressure trends reviewed . Home blood pressure monitoring encouraged to continue and sent 2022 Calendar Booklet to help with documentation . Encouraged patient  to take log to medical appointments for provider review Patient Goals/Self-Care Activities:  Over the next 30 days, patient will: . Check blood pressure daily . Write blood pressure results in a log  . Take log to Medical appointments for provider review . Low salt diet . Notify provider for sustained elevations not decreasing with as needed medication  Follow Up Plan: The care management team will reach out to the patient again over the next 30 business days.       Plan:The care management team will reach out to the patient again over the next 30 business days.  Hubert Azure RN, MSN RN Care Management Coordinator [place] [phone] [email or web address]

## 2020-04-26 NOTE — Patient Instructions (Signed)
Visit Information  PATIENT GOALS: Goals Addressed            This Visit's Progress   . (RNCM) Monitor and Manage My Blood Sugar-Diabetes Type 2   Not on track    Timeframe:  Long-Range Goal Priority:  Medium Start Date:  03/08/20                           Expected End Date:   11/01/20                    Follow Up Date 05/17/20    . Check blood sugar at least 4 times a day . Check blood sugar if I feel it is too high or too low . Take the blood sugar log and meter to all doctor visits . Try to limit carbohydrates in diet (review diet education mailed) . Take medications as prescribed . Contact manufacturer for new sensors to replace the ones not working . Change meter/sensor as soon as possible to check your blood sugars . Schedule Eye Exam in the next month   Why is this important?    Checking your blood sugar at home helps to keep it from getting very high or very low.   Writing the results in a diary or log helps the doctor know how to care for you.   Your blood sugar log should have the time, date and the results.   Also, write down the amount of insulin or other medicine that you take.   Other information, like what you ate, exercise done and how you were feeling, will also be helpful.     Notes:     Marland Kitchen [facility]) Track and Manage My Blood Pressure-Hypertension   On track    Timeframe:  Long-Range Goal Priority:  Medium Start Date:   03/08/2020                          Expected End Date:  11/01/2020                     Follow Up Date 05/17/20    . Check blood pressure daily . Write blood pressure results in a log  . Take log to Medical appointments for provider review . Low salt diet . Notify provider for sustained elevations not decreasing with as needed medication   Why is this important?    You won't feel high blood pressure, but it can still hurt your blood vessels.   High blood pressure can cause heart or kidney problems. It can also cause a stroke.   Making  lifestyle changes like losing a little weight or eating less salt will help.   Checking your blood pressure at home and at different times of the day can help to control blood pressure.   If the doctor prescribes medicine remember to take it the way the doctor ordered.   Call the office if you cannot afford the medicine or if there are questions about it.     Notes:        The patient verbalized understanding of instructions, educational materials, and care plan provided today and declined offer to receive copy of patient instructions, educational materials, and care plan.   The care management team will reach out to the patient again over the next 30 business days.   Hubert Azure RN, MSN RN Care Management Coordinator [facility]  Friend[place] [email or web address]

## 2020-05-05 ENCOUNTER — Ambulatory Visit: Payer: Medicare Other | Admitting: Family Medicine

## 2020-05-13 ENCOUNTER — Encounter: Payer: Self-pay | Admitting: Family Medicine

## 2020-05-13 ENCOUNTER — Other Ambulatory Visit: Payer: Self-pay

## 2020-05-13 ENCOUNTER — Ambulatory Visit (INDEPENDENT_AMBULATORY_CARE_PROVIDER_SITE_OTHER): Payer: Medicare Other | Admitting: Pharmacist

## 2020-05-13 ENCOUNTER — Ambulatory Visit (INDEPENDENT_AMBULATORY_CARE_PROVIDER_SITE_OTHER): Payer: Medicare Other | Admitting: Family Medicine

## 2020-05-13 DIAGNOSIS — IMO0002 Reserved for concepts with insufficient information to code with codable children: Secondary | ICD-10-CM

## 2020-05-13 DIAGNOSIS — F32A Depression, unspecified: Secondary | ICD-10-CM

## 2020-05-13 DIAGNOSIS — E785 Hyperlipidemia, unspecified: Secondary | ICD-10-CM | POA: Diagnosis not present

## 2020-05-13 DIAGNOSIS — E1165 Type 2 diabetes mellitus with hyperglycemia: Secondary | ICD-10-CM | POA: Diagnosis not present

## 2020-05-13 DIAGNOSIS — I10 Essential (primary) hypertension: Secondary | ICD-10-CM | POA: Diagnosis not present

## 2020-05-13 DIAGNOSIS — R519 Headache, unspecified: Secondary | ICD-10-CM | POA: Diagnosis not present

## 2020-05-13 DIAGNOSIS — F419 Anxiety disorder, unspecified: Secondary | ICD-10-CM | POA: Diagnosis not present

## 2020-05-13 DIAGNOSIS — G8929 Other chronic pain: Secondary | ICD-10-CM

## 2020-05-13 DIAGNOSIS — E1140 Type 2 diabetes mellitus with diabetic neuropathy, unspecified: Secondary | ICD-10-CM

## 2020-05-13 LAB — HEPATIC FUNCTION PANEL
ALT: 12 U/L (ref 0–53)
AST: 13 U/L (ref 0–37)
Albumin: 4.1 g/dL (ref 3.5–5.2)
Alkaline Phosphatase: 78 U/L (ref 39–117)
Bilirubin, Direct: 0.1 mg/dL (ref 0.0–0.3)
Total Bilirubin: 0.9 mg/dL (ref 0.2–1.2)
Total Protein: 6.9 g/dL (ref 6.0–8.3)

## 2020-05-13 LAB — LDL CHOLESTEROL, DIRECT: Direct LDL: 99 mg/dL

## 2020-05-13 NOTE — Assessment & Plan Note (Signed)
He will continue Lipitor 80 mg once daily.  Check labs today.

## 2020-05-13 NOTE — Assessment & Plan Note (Signed)
Uncontrolled.  We will increase his Humalog dose to 14 units twice daily with meals.  He will continue Basaglar 30 units once daily.  He will discontinue glipizide.  He will continue on Jardiance 25 mg once daily, metformin 1000 mg twice daily, and Trulicity 4.5 mg once weekly.

## 2020-05-13 NOTE — Assessment & Plan Note (Signed)
Patient with chronic sporadic headaches.  These do not occur very frequently.  This could represent a migraine variant.  Given lack of symptoms and normal neurological exam he will monitor at this time as they are chronic in nature.  Discussed that he should seek medical attention for severe headache, numbness, weakness, vision changes, or other new symptoms with his headaches.

## 2020-05-13 NOTE — Patient Instructions (Addendum)
Nice to see you. Please discontinue your glipizide. We will increase your Humalog to 14 units twice daily with your meals. Please call [employer] to get your freestyle libre sensors replaced. If you have recurrence of your headache or you develop numbness, weakness, vision changes, or other new symptoms with your headache please seek medical attention.

## 2020-05-13 NOTE — Assessment & Plan Note (Signed)
Adequate control for age.  He will continue amlodipine 10 mg once daily, Cardura 4 mg once daily, Lasix 20 mg daily, hydralazine 10 mg twice daily, losartan 100 mg daily, metoprolol 100 mg daily.

## 2020-05-13 NOTE — Chronic Care Management (AMB) (Signed)
Chronic Care Management Pharmacy Note  05/13/2020 Name:  Shane Jones MRN:  [id number] DOB:  05/13/1945  Subjective: Shane Jones is an 75 y.o. year old male who is a primary patient of Caryl Bis, Angela Adam, MD.  The CCM team was consulted for assistance with disease management and care coordination needs.    Engaged with patient face to face for follow up visit in response to provider referral for pharmacy case management and/or care coordination services.   Consent to Services:  The patient was given information about Chronic Care Management services, agreed to services, and gave verbal consent prior to initiation of services.  Please see initial visit note for detailed documentation.   Patient Care Team: Leone Haven, MD as PCP - General (Family Medicine) De Hollingshead, RPH-CPP as Pharmacist (Pharmacist) Leona Singleton, RN as Case Manager   Objective:  Lab Results  Component Value Date   CREATININE 1.65 (H) 03/10/2020   CREATININE 1.81 (H) 09/01/2019   CREATININE 1.85 (H) 01/14/2019    Lab Results  Component Value Date   HGBA1C 7.8 (H) 03/10/2020   Last diabetic Eye exam:  Lab Results  Component Value Date/Time   HMDIABEYEEXA No Retinopathy 04/16/2017 12:00 AM    Last diabetic Foot exam:  Lab Results  Component Value Date/Time   HMDIABFOOTEX Normal 07/27/2015 12:00 AM        Component Value Date/Time   CHOL 146 03/10/2020 1526   TRIG (H) 03/10/2020 1526    402.0 Triglyceride is over 400; calculations on Lipids are invalid.   HDL 30.10 (L) 03/10/2020 1526   CHOLHDL 5 03/10/2020 1526   VLDL 77.6 (H) 09/01/2019 1545   LDLCALC 23 08/07/2016 1156   LDLDIRECT 79.0 03/10/2020 1526    Hepatic Function Latest Ref Rng & Units 09/01/2019 12/26/2018 09/25/2018  Total Protein 6.0 - 8.3 g/dL 6.9 6.5 7.1  Albumin 3.5 - 5.2 g/dL 4.3 4.2 4.5  AST 0 - 37 U/L _0 ALT 0 - 53 U/L _1 Alk Phosphatase 39 - 117 U/L 75 68 67  Total Bilirubin 0.2 - 1.2  mg/dL 1.0 0.9 0.7    Lab Results  Component Value Date/Time   TSH 1.11 12/26/2018 09:30 AM   TSH 0.84 02/17/2011 08:30 AM    CBC Latest Ref Rng & Units 12/26/2018  WBC 4.0 - 10.5 K/uL 6.2  Hemoglobin 13.0 - 17.0 g/dL 16.2  Hematocrit 39.0 - 52.0 % 46.3  Platelets 150.0 - 400.0 K/uL 188.0    No results found for: VD25OH  Clinical ASCVD: Yes  The 10-year ASCVD risk score Mikey Bussing DC Jr., et al., 2013) is: 51.9%   Values used to calculate the score:     Age: 40 years     Sex: Male     Is Non-Hispanic African American: No     Diabetic: Yes     Tobacco smoker: Yes     Systolic Blood Pressure: 650 mmHg     Is BP treated: Yes     HDL Cholesterol: 30.1 mg/dL     Total Cholesterol: 146 mg/dL     Social History   Tobacco Use  Smoking Status Former Smoker  . Quit date: 11/20/1978  . Years since quitting: 41.5  Smokeless Tobacco Never Used   BP Readings from Last 3 Encounters:  05/13/20 120/80  03/10/20 140/80  12/10/19 118/70   Pulse Readings from Last 3 Encounters:  05/13/20 73  03/10/20 86  12/10/19 69  Wt Readings from Last 3 Encounters:  05/13/20 168 lb (76.2 kg)  03/10/20 187 lb 12.8 oz (85.2 kg)  02/10/20 182 lb (82.6 kg)    Assessment: Review of patient past medical history, allergies, medications, health status, including review of consultants reports, laboratory and other test data, was performed as part of comprehensive evaluation and provision of chronic care management services.   SDOH:  (Social Determinants of Health) assessments and interventions performed:  SDOH Interventions   Flowsheet Row Most Recent Value  SDOH Interventions   Financial Strain Interventions Other (Comment)  [patient assistance]      CCM Care Plan  Allergies  Allergen Reactions  . Uloric [Febuxostat] Hives    hives    Medications Reviewed Today    Reviewed by Gordy Councilman, CMA (Certified Medical Assistant) on 05/13/20 at 1122  Med List Status: <None>  Medication  Order Taking? Sig Documenting Provider Last Dose Status Informant  Accu-Chek FastClix Lancets MISC [id number] Yes USE UP TO FOUR TIMES DAILY AS DIRECTED Leone Haven, MD Taking Active   ACCU-CHEK GUIDE test strip [id number] Yes USE UP TO FOUR TIMES DAILY AS DIRECTED Leone Haven, MD Taking Active   allopurinol (ZYLOPRIM) 100 MG tablet [id number] Yes TAKE 1 TABLET DAILY Leone Haven, MD Taking Active   amLODipine (NORVASC) 10 MG tablet [id number] Yes TAKE 1 TABLET EVERY DAY Leone Haven, MD Taking Active   aspirin 325 MG tablet [id number] Yes Take 325 mg by mouth daily. [provider] Taking Active   atorvastatin (LIPITOR) 80 MG tablet [id number] Yes TAKE 1 TABLET EVERY DAY Leone Haven, MD Taking Active   B Complex Vitamins (VITAMIN-B COMPLEX PO) [id number] Yes Take by mouth. [provider] Taking Active   BETA CAROTENE PO [id number] Yes Take by mouth. [provider] Taking Active   blood glucose meter kit and supplies [id number] Yes Dispense based on patient and insurance preference. Use up to four times daily as directed. (FOR ICD-10 E10.9, E11.9). Leone Haven, MD Taking Active   Calcium Carbonate-Vitamin D (CALCIUM + D PO) [id number] Yes Take by mouth. [provider] Taking Active   doxazosin (CARDURA) 4 MG tablet [id number] Yes  [provider] Taking Active   Dulaglutide (TRULICITY) 4.5 NL/9.7QB SOPN [id number] Yes Inject 4.5 mg as directed once a week. Leone Haven, MD Taking Active            Med Note [name] Mar 04, 2020  1:18 PM) Using 3 mg   empagliflozin (JARDIANCE) 25 MG TABS tablet [id number] Yes Take 25 mg by mouth daily. Leone Haven, MD Taking Active   escitalopram (LEXAPRO) 20 MG tablet [id number] Yes TAKE 1 TABLET (20 MG) BY MOUTH DAILY Leone Haven, MD Taking Active   furosemide (LASIX) 20 MG tablet [id number] Yes Take one tablet (20 mg total) by mouth daily. Leone Haven, MD Taking Active   gabapentin (NEURONTIN) 400 MG capsule [id number] Yes Take 1 capsule (400 mg total) by mouth 3 (three) times daily. Leone Haven, MD Taking Active   glipiZIDE (GLUCOTROL) 5 MG tablet [id number] Yes Take 1 tablet by mouth 2 (two) times daily. [provider] Taking Active   hydrALAZINE (APRESOLINE) 10 MG tablet [id number] Yes Take 1 tablet (10 mg total) by mouth in the morning and at bedtime. Leone Haven, MD Taking Active   Insulin Glargine [facility]) 100 UNIT/ML [id number] Yes Inject 30 Units into the skin daily.  Leone Haven, MD Taking Active            Med Note Darnelle Maffucci, Maralyn Sago Mar 04, 2020  1:17 PM) Taking 2 shots of 15 units  insulin lispro (HUMALOG KWIKPEN) 100 UNIT/ML KwikPen [id number] Yes Inject 10 units up to three times daily with meals Leone Haven, MD Taking Active            Med Note Leona Singleton   Mon Mar 08, 2020  1:47 PM) Reports taking 10 units TID with meals if blood sugars >200  Insulin Pen Needle (PEN NEEDLES) 32G X 4 MM MISC [id number] Yes Use to inject insulin up to 4 times daily Leone Haven, MD Taking Active   losartan (COZAAR) 100 MG tablet [id number] Yes TAKE 1 TABLET EVERY DAY Leone Haven, MD Taking Active   magnesium 30 MG tablet [id number] Yes Take 30 mg by mouth 2 (two) times daily. [provider] Taking Active   metFORMIN (GLUCOPHAGE XR) 500 MG 24 hr tablet [id number] Yes Take 2 tablets (1,000 mg total) by mouth in the morning and at bedtime. Leone Haven, MD Taking Active   metoprolol succinate (TOPROL-XL) 100 MG 24 hr tablet [id number] Yes TAKE 1 TABLET EVERY DAY Leone Haven, MD Taking Active   Multiple Vitamin (MULTIVITAMIN) tablet [id number] Yes Take 1 tablet by mouth daily. [provider] Taking Active   pantoprazole (PROTONIX) 40 MG tablet [id number] Yes Take 1 tablet (40 mg total) by mouth daily. Leone Haven, MD Taking Active   potassium chloride  (KLOR-CON) 10 MEQ tablet [id number] Yes TAKE 1 TABLET BY MOUTH DAILY WITH LASIX Leone Haven, MD Taking Active   Zinc Sulfate (ZINC 15 PO) [id number] Yes Take by mouth. [provider] Taking Active           Patient Active Problem List   Diagnosis Date Noted  . Headache 05/13/2020  . Overweight 05/30/2019  . Acute kidney failure ([place]) 01/22/2019  . Benign hypertensive kidney disease with chronic kidney disease 01/22/2019  . Secondary hyperparathyroidism of renal origin ([place]) 01/22/2019  . Stage 3a chronic kidney disease ([place]) 01/22/2019  . Lightheadedness 12/19/2018  . Diarrhea 01/21/2018  . Skin lesions 01/21/2018  . Esophagitis   . Proteinuria 10/10/2017  . Onychomycosis 03/19/2017  . Anxiety and depression 09/19/2016  . Hypokalemia 09/19/2016  . Loss of height 05/01/2016  . Left shoulder pain 01/31/2016  . Prostate cancer screening 09/27/2012  . Sleep apnea 11/22/2011  . Chronic back pain 08/17/2011  . Hyperlipidemia 08/17/2011  . Gout 08/17/2011  . Type 2 diabetes, uncontrolled, with neuropathy ([place]) 02/20/2011  . Neuropathy ([place]) 02/17/2011  . Hypertension 10/13/2010    Immunization History  Administered Date(s) Administered  . Fluad Quad(high Dose 65+) 09/25/2019  . Influenza Split 10/13/2010, 11/22/2011  . Influenza, High Dose Seasonal PF 11/01/2015, 10/30/2016, 10/10/2017  . Influenza,inj,Quad PF,6+ Mos 09/27/2012, 10/20/2013, 09/28/2014  . Moderna Sars-Covid-2 Vaccination 03/20/2019, 04/17/2019  . Pneumococcal Conjugate-13 04/14/2013  . Pneumococcal Polysaccharide-23 08/17/2011  . Tdap 08/16/2009    Conditions to be addressed/monitored: HTN, HLD and DMII  Care Plan : Medication Management  Updates made by De Hollingshead, RPH-CPP since 05/13/2020 12:00 AM    Problem: Diabetes, Hypertension, CKD, Depression     Long-Range Goal: Disease Progression Prevention   This Visit's Progress: On track  Recent Progress: On track  Priority: High   Note:   Current Barriers:  . Unable to independently afford treatment regimen .  Unable to independently monitor therapeutic efficacy . Unable to achieve control of diabetes   Pharmacist Clinical Goal(s):  Marland Kitchen Over the next 90 days, patient will verbalize ability to afford treatment regimen. . Over the next 90 days, patient will achieve control of diabetes as evidenced by improvement in A1c through collaboration with PharmD and provider.   Interventions: . 1:1 collaboration with Leone Haven, MD regarding development and update of comprehensive plan of care as evidenced by provider attestation and co-signature . Inter-disciplinary care team collaboration (see longitudinal plan of care) . Comprehensive medication review performed; medication list updated in electronic medical record  Diabetes: . Uncontrolled; current treatment: metformin XR 1000 mg BID, Jardiance 25 mg daily, Trulicity 4.5 mg; Basaglar 30 units daily (prefers to split and take 15 units in two separate shots) Humalog 10 units up to TID w/meals . Approved for patient assistance for Basaglar, Humalog, Trulicity, and Jardiance through 2022 . Current glucose readings: Date of Download: 3/27-04/10/20 (was awaiting new shipment) % Time CGM is active: 67% Average Glucose: 164 mg/dL Glucose Management Indicator: 7.2% Glucose Variability: 26.3 (goal <36%) Time in Goal:  - Time in range 70-180: 71% - Time above range: 29% - Time below range: 0% . Advised to contact [employer] customer service for replacement sensors. Sample provided today . Collaborated with PCP. Given elevated post prandial readings, increase Humalog to 14 units with meals. Stop glipizide to prevent hypoglycemia. Continue Basaglar total of 30 units daily, Jardiance 25 mg daily, Trulicity 4.5 mg weekly . Congratulated on improvement in glucose readings  Hypertension: . Controlled per clinic reaidng; current treatment: furosemide 20 mg QAM, losartan 100 mg QAM,  metoprolol XL 100 mg QAM, amlodipine 10 mg daily QAM, doxazosin 4 mg QPM, hydralazine 10 mg BID . Recommended to continue current regimen at this time.  Hyperlipidemia: . Controlled per last lipid panel; current treatment: atorvastatin 40 mg daily . Antiplatelet regimen: aspirin 325 mg daily (hx CVA per chart review) . Recommended to continue current regimen at this time.   Depression: . Controlled per patient report; current treatment: escitalopram 20 mg daily . Recommend to continue current regimen at this time.   Patient Goals/Self-Care Activities . Over the next 90 days, patient will:  - take medications as prescribed check glucose three times daily using CGM, document, and provide at future appointments check blood pressure periodically, document, and provide at future appointments collaborate with provider on medication access solutions  Follow Up Plan: ~ Telephone follow up appointment with care management team member scheduled for:~ 8 weeks        Medication AssistanceBasaglar, Trulicity, Humalog, Jardianceobtained through[place] and [facility] Caresmedication assistance program. Enrollment ends 01/01/21  Patient's preferred pharmacy is:  [id number] [address], [address] [place] [address] Phone: [phone] Fax: [facility] [place], [address] [place] [address] Phone: [phone] Fax: [phone]  Follow Up:  Patient agrees to Care Plan and Follow-up.  Plan: Telephone follow up appointment with care management team member scheduled for:  ~ 8 weeks  Catie Darnelle Maffucci, PharmD, [place], [place] Clinical Pharmacist [employer] [facility] [employer] [phone]

## 2020-05-13 NOTE — Patient Instructions (Addendum)
Call [employer] to request the refill on the sensors. Call [employer] Customer Service to get replacements on the sensors that did not work. Their number is [phone].  Increase Humalog (purple) to 14 units 15-20 minute before your meals. STOP glipizide. Glipizide plus Humalog can cause    Keep taking a total of Basaglar 30 units daily, Jardiance 25 mg daily, Trulicity 4.5 mg weekly.   I rescheduled our follow up call to July. Call me if you need to reschedule.   Take care!  Catie Darnelle Maffucci, PharmD [phone] Visit Information  PATIENT GOALS: Goals Addressed              This Visit's Progress     Patient Stated   .  Medication Monitoring (pt-stated)        Patient Goals/Self-Care Activities . Over the next 90 days, patient will:  - take medications as prescribed check glucose three times daily using CGM, document, and provide at future appointments check blood pressure periodically, document, and provide at future appointments collaborate with provider on medication access solutions       Print copy of patient instructions, educational materials, and care plan provided in person.  Plan: Telephone follow up appointment with care management team member scheduled for:  ~ 8 weeks  Catie Darnelle Maffucci, PharmD, [place], [place] Clinical Pharmacist [employer] at [employer] [phone]

## 2020-05-13 NOTE — Progress Notes (Signed)
[name], MD Phone: [phone]  Shane Jones is a 75 y.o. male who presents today for f/u.  DIABETES Disease Monitoring: Blood Sugar ranges-130s as his highs Polyuria/phagia/dipsia- no      Optho- scheduled Medications: Compliance- taking trulicity, jardiance, glipizide, metformin, basaglar 30 u once daily, humalog 10 u once daily Hypoglycemic symptoms- no Notes he does not have a consistent meal schedule. He has not been using his freestyle libre over the past month related to an issue with the sensors.  HYPERTENSION  Disease Monitoring  Home BP Monitoring [phone] systolic Chest pain- no    Dyspnea- no Medications  Compliance-  Taking amlodipine, cardura, lasix, losartan, metoprolol, hydralazine.  Edema- no  HYPERLIPIDEMIA Symptoms Chest pain on exertion:  no   Medications: Compliance- taking lipitor Right upper quadrant pain- no  Muscle aches- no  Headache: Patient notes a history of headaches similar to this going back many years.  Notes they only occur sporadically.  He notes last night he had a headache behind his right eye that was fairly significant.  He took a couple of aspirin and laid down and it went away.  He has no headache at this time.  He has had headaches like this previously.  He has had no vision changes, numbness, or weakness.   Social History   Tobacco Use  Smoking Status Former Smoker  . Quit date: 11/20/1978  . Years since quitting: 41.5  Smokeless Tobacco Never Used    Current Outpatient Medications on File Prior to Visit  Medication Sig Dispense Refill  . Accu-Chek FastClix Lancets MISC USE UP TO FOUR TIMES DAILY AS DIRECTED 102 each 2  . ACCU-CHEK GUIDE test strip USE UP TO FOUR TIMES DAILY AS DIRECTED 100 each 2  . allopurinol (ZYLOPRIM) 100 MG tablet TAKE 1 TABLET DAILY 90 tablet 03  . amLODipine (NORVASC) 10 MG tablet TAKE 1 TABLET EVERY DAY 90 tablet 2  . aspirin 325 MG tablet Take 325 mg by mouth daily.    Marland Kitchen atorvastatin (LIPITOR)  80 MG tablet TAKE 1 TABLET EVERY DAY 90 tablet 3  . B Complex Vitamins (VITAMIN-B COMPLEX PO) Take by mouth.    Marland Kitchen BETA CAROTENE PO Take by mouth.    . blood glucose meter kit and supplies Dispense based on patient and insurance preference. Use up to four times daily as directed. (FOR ICD-10 E10.9, E11.9). 1 each 0  . Calcium Carbonate-Vitamin D (CALCIUM + D PO) Take by mouth.    . doxazosin (CARDURA) 4 MG tablet     . Dulaglutide (TRULICITY) 4.5 QI/3.4VQ SOPN Inject 4.5 mg as directed once a week. 6 mL 1  . empagliflozin (JARDIANCE) 25 MG TABS tablet Take 25 mg by mouth daily. 90 tablet 3  . escitalopram (LEXAPRO) 20 MG tablet TAKE 1 TABLET (20 MG) BY MOUTH DAILY 90 tablet 1  . furosemide (LASIX) 20 MG tablet Take one tablet (20 mg total) by mouth daily. 90 tablet 1  . gabapentin (NEURONTIN) 400 MG capsule Take 1 capsule (400 mg total) by mouth 3 (three) times daily. 270 capsule 0  . glipiZIDE (GLUCOTROL) 5 MG tablet Take 1 tablet by mouth 2 (two) times daily.    . hydrALAZINE (APRESOLINE) 10 MG tablet Take 1 tablet (10 mg total) by mouth in the morning and at bedtime. 60 tablet 2  . Insulin Glargine (BASAGLAR KWIKPEN) 100 UNIT/ML Inject 30 Units into the skin daily. 15 mL 0  . insulin lispro (HUMALOG KWIKPEN) 100 UNIT/ML KwikPen Inject  10 units up to three times daily with meals 3 mL 0  . Insulin Pen Needle (PEN NEEDLES) 32G X 4 MM MISC Use to inject insulin up to 4 times daily 400 each 3  . losartan (COZAAR) 100 MG tablet TAKE 1 TABLET EVERY DAY 90 tablet 2  . magnesium 30 MG tablet Take 30 mg by mouth 2 (two) times daily.    . metFORMIN (GLUCOPHAGE XR) 500 MG 24 hr tablet Take 2 tablets (1,000 mg total) by mouth in the morning and at bedtime. 360 tablet 3  . metoprolol succinate (TOPROL-XL) 100 MG 24 hr tablet TAKE 1 TABLET EVERY DAY 90 tablet 1  . Multiple Vitamin (MULTIVITAMIN) tablet Take 1 tablet by mouth daily.    . pantoprazole (PROTONIX) 40 MG tablet Take 1 tablet (40 mg total) by  mouth daily. 90 tablet 0  . potassium chloride (KLOR-CON) 10 MEQ tablet TAKE 1 TABLET BY MOUTH DAILY WITH LASIX 90 tablet 0  . Zinc Sulfate (ZINC 15 PO) Take by mouth.     No current facility-administered medications on file prior to visit.     ROS see history of present illness  Objective  Physical Exam Vitals:   05/13/20 1121  BP: 120/80  Pulse: 73  Temp: 97.9 F (36.6 C)  SpO2: 97%    BP Readings from Last 3 Encounters:  05/13/20 120/80  03/10/20 140/80  12/10/19 118/70   Wt Readings from Last 3 Encounters:  05/13/20 168 lb (76.2 kg)  03/10/20 187 lb 12.8 oz (85.2 kg)  02/10/20 182 lb (82.6 kg)    Physical Exam Constitutional:      General: He is not in acute distress.    Appearance: He is not diaphoretic.  Cardiovascular:     Rate and Rhythm: Normal rate and regular rhythm.     Heart sounds: Normal heart sounds.  Pulmonary:     Effort: Pulmonary effort is normal.     Breath sounds: Normal breath sounds.  Musculoskeletal:     Right lower leg: No edema.     Left lower leg: No edema.  Skin:    General: Skin is warm and dry.  Neurological:     Mental Status: He is alert.     Comments: EOMI, PERRL, opens and closes eyes adequately, V1 through V3 intact bilaterally to light touch, hearing intact to finger rub bilaterally, shoulder shrug intact bilaterally, 5/5 strength in bilateral biceps, triceps, grip, quads, hamstrings, plantar and dorsiflexion, sensation to light touch intact in bilateral UE and LE, normal gait      Assessment/Plan: Please see individual problem list.  Problem List Items Addressed This Visit    Hypertension (Chronic)    Adequate control for age.  He will continue amlodipine 10 mg once daily, Cardura 4 mg once daily, Lasix 20 mg daily, hydralazine 10 mg twice daily, losartan 100 mg daily, metoprolol 100 mg daily.      Type 2 diabetes, uncontrolled, with neuropathy (HCC) (Chronic)    Uncontrolled.  We will increase his Humalog dose to 14  units twice daily with meals.  He will continue Basaglar 30 units once daily.  He will discontinue glipizide.  He will continue on Jardiance 25 mg once daily, metformin 1000 mg twice daily, and Trulicity 4.5 mg once weekly.      Hyperlipidemia (Chronic)    He will continue Lipitor 80 mg once daily.  Check labs today.      Relevant Orders   Hepatic function panel   Direct  LDL   Headache    Patient with chronic sporadic headaches.  These do not occur very frequently.  This could represent a migraine variant.  Given lack of symptoms and normal neurological exam he will monitor at this time as they are chronic in nature.  Discussed that he should seek medical attention for severe headache, numbness, weakness, vision changes, or other new symptoms with his headaches.        Return in about 6 weeks (around 06/24/2020) for Diabetes.  This visit occurred during the SARS-CoV-2 public health emergency.  Safety protocols were in place, including screening questions prior to the visit, additional usage of staff PPE, and extensive cleaning of exam room while observing appropriate contact time as indicated for disinfecting solutions.    [name], MD [place]

## 2020-05-17 ENCOUNTER — Telehealth: Payer: Self-pay | Admitting: *Deleted

## 2020-05-17 ENCOUNTER — Telehealth: Payer: Medicare Other

## 2020-05-17 NOTE — Telephone Encounter (Signed)
  Care Management   Follow Up Note   05/17/2020 Name: Shane Jones MRN: [id number] DOB: [birth year]   Referred by: Leone Haven, MD Reason for referral : Chronic Care Management (DM, HTN)   An unsuccessful telephone outreach was attempted today. The patient was referred to the case management team for assistance with care management and care coordination.   Follow Up Plan: RNCM will seek assistance from [employer] in rescheduling patient within the next 30 days.  Hubert Azure RN, MSN RN Care Management Coordinator [place] [phone] [email or web address]

## 2020-05-19 ENCOUNTER — Telehealth: Payer: Medicare Other

## 2020-05-19 ENCOUNTER — Telehealth: Payer: Self-pay | Admitting: Pharmacist

## 2020-05-19 NOTE — Telephone Encounter (Addendum)
Received message from Gae Bon, [place] that when she called patient today to review lab results, he was concerned about glucose readings being severely elevated in 300-400s and reported confusion to her.   Called patient.   Notes he feels "crazy" but difficult to ascertain if it's him being concerned about blood sugars or another issue. Wondered if CGM sensor is accurate, as elevated sugar readings started when he placed a new sensor, and he often gets upset about his glucose readings.   He denies missing any medication doses. Reports headache since this morning that is resolving. Denies nausea, stomach upset, diarrhea. Denies chest pain and denies visual changes. Denies facial droop, speech is not slurred during our call. Has not checked blood pressure. Reports his headache is improved.   Advised that he seek urgent evaluation. He notes that his son will take him to Urgent Care or the Emergency Department after he gets off work. Advised that if headache worsens, chest pain occurs, visual changes, speech changes, that he call 911. He verbalized understanding.   Routing to PCP.

## 2020-05-19 NOTE — Telephone Encounter (Signed)
Agree with need to be seen at urgent care with headache and elevated glucose.

## 2020-05-20 ENCOUNTER — Telehealth: Payer: Self-pay

## 2020-05-20 NOTE — Chronic Care Management (AMB) (Signed)
  Care Management   Note  05/20/2020 Name: Shane Jones MRN: [id number] DOB: 15-Jan-1945  Shane Jones is a 75 y.o. year old male who is a primary care patient of Leone Haven, MD and is actively engaged with the care management team. I reached out to Irean Hong by phone today to assist with re-scheduling an initial visit with the RN Case Manager  Follow up plan: Unsuccessful telephone outreach attempt made.The care management team will reach out to the patient again over the next 7 days.  If patient returns call to provider office, please advise to call [facility]  at [place], [place], [place], [place] [address] Direct Dial: [phone] [email or web address]: [email or web address]

## 2020-05-21 NOTE — Chronic Care Management (AMB) (Signed)
  Care Management   Note  05/21/2020 Name: SCOTTI KOSTA MRN: [id number] DOB: 1945-03-19  ARMSTRONG CREASY is a 75 y.o. year old male who is a primary care patient of Leone Haven, MD and is actively engaged with the care management team. I reached out to Irean Hong by phone today to assist with re-scheduling a follow up visit with the RN Case Manager  Follow up plan: Unsuccessful telephone outreach attempt made. The care management team will reach out to the patient again over the next 7 days.  If patient returns call to provider office, please advise to call [facility]  at [place], [place], [place], [place] [address] Direct Dial: [phone] [email or web address]: [email or web address]

## 2020-05-28 ENCOUNTER — Ambulatory Visit: Payer: Medicare Other

## 2020-05-28 NOTE — Chronic Care Management (AMB) (Signed)
  Care Management   Note  05/28/2020 Name: BEAUFORD LANDO MRN: [id number] DOB: 02/10/1945  DIMARCO MINKIN is a 75 y.o. year old male who is a primary care patient of Leone Haven, MD and is actively engaged with the care management team. I reached out to Irean Hong by phone today to assist with re-scheduling an initial visit with the RN Case Manager  Follow up plan: Unable to make contact on outreach attempts x 3. PCP Leone Haven, MD notified via routed documentation in medical record.   Noreene Larsson, [place], [place], [place] [address] Direct Dial: [phone] [email or web address]

## 2020-05-28 NOTE — Telephone Encounter (Signed)
3rd unsuccessful outreach to reschedule initial

## 2020-06-07 ENCOUNTER — Ambulatory Visit: Payer: Medicare Other | Admitting: Pharmacist

## 2020-06-07 ENCOUNTER — Other Ambulatory Visit: Payer: Self-pay | Admitting: Family Medicine

## 2020-06-07 ENCOUNTER — Telehealth: Payer: Self-pay | Admitting: Family Medicine

## 2020-06-07 DIAGNOSIS — I10 Essential (primary) hypertension: Secondary | ICD-10-CM

## 2020-06-07 DIAGNOSIS — F32A Depression, unspecified: Secondary | ICD-10-CM

## 2020-06-07 DIAGNOSIS — G629 Polyneuropathy, unspecified: Secondary | ICD-10-CM

## 2020-06-07 DIAGNOSIS — F419 Anxiety disorder, unspecified: Secondary | ICD-10-CM

## 2020-06-07 DIAGNOSIS — IMO0002 Reserved for concepts with insufficient information to code with codable children: Secondary | ICD-10-CM

## 2020-06-07 DIAGNOSIS — E785 Hyperlipidemia, unspecified: Secondary | ICD-10-CM

## 2020-06-07 DIAGNOSIS — E1140 Type 2 diabetes mellitus with diabetic neuropathy, unspecified: Secondary | ICD-10-CM

## 2020-06-07 MED ORDER — INSULIN LISPRO (1 UNIT DIAL) 100 UNIT/ML (KWIKPEN): PEN_INJECTOR | SUBCUTANEOUS | 0 refills | Status: DC

## 2020-06-07 MED ORDER — BASAGLAR KWIKPEN 100 UNIT/ML ~~LOC~~ SOPN: 26.0000 [IU] | PEN_INJECTOR | Freq: Every day | SUBCUTANEOUS | 0 refills | Status: DC

## 2020-06-07 NOTE — Patient Instructions (Signed)
Visit Information  PATIENT GOALS: Goals Addressed              This Visit's Progress     Patient Stated   .  Medication Monitoring (pt-stated)        Patient Goals/Self-Care Activities . Over the next 90 days, patient will:  - take medications as prescribed check glucose three times daily using CGM, document, and provide at future appointments check blood pressure periodically, document, and provide at future appointments collaborate with provider on medication access solutions         Patient verbalizes understanding of instructions provided today and agrees to view in [place].   Plan: Telephone follow up appointment with care management team member scheduled for:  ~ 3 weeks  Catie Darnelle Maffucci, PharmD, [place], [place] Clinical Pharmacist [employer] at [employer] [phone]

## 2020-06-07 NOTE — Chronic Care Management (AMB) (Signed)
Chronic Care Management Pharmacy Note  06/07/2020 Name:  Shane Jones MRN:  [id number] DOB:  01/25/1945   Subjective: Shane Jones is an 75 y.o. year old male who is a primary patient of Caryl Bis, Angela Adam, MD.  The CCM team was consulted for assistance with disease management and care coordination needs.    Engaged with patient by telephone for reponse to call regarding medication management in response to provider referral for pharmacy case management and/or care coordination services.   Consent to Services:  The patient was given information about Chronic Care Management services, agreed to services, and gave verbal consent prior to initiation of services.  Please see initial visit note for detailed documentation.   Patient Care Team: Leone Haven, MD as PCP - General (Family Medicine) De Hollingshead, [place] as Pharmacist (Pharmacist) Leona Singleton, RN as Case Manager  Recent office visits: None since our last visit  Recent consult visits: None since our last visit  Hospital visits: None in previous 6 months  Objective:  Lab Results  Component Value Date   CREATININE 1.65 (H) 03/10/2020   CREATININE 1.81 (H) 09/01/2019   CREATININE 1.85 (H) 01/14/2019    Lab Results  Component Value Date   HGBA1C 7.8 (H) 03/10/2020   Last diabetic Eye exam:  Lab Results  Component Value Date/Time   HMDIABEYEEXA No Retinopathy 04/16/2017 12:00 AM    Last diabetic Foot exam:  Lab Results  Component Value Date/Time   HMDIABFOOTEX Normal 07/27/2015 12:00 AM        Component Value Date/Time   CHOL 146 03/10/2020 1526   TRIG (H) 03/10/2020 1526    402.0 Triglyceride is over 400; calculations on Lipids are invalid.   HDL 30.10 (L) 03/10/2020 1526   CHOLHDL 5 03/10/2020 1526   VLDL 77.6 (H) 09/01/2019 1545   LDLCALC 23 08/07/2016 1156   LDLDIRECT 99.0 05/13/2020 1213    Hepatic Function Latest Ref Rng & Units 05/13/2020 09/01/2019 12/26/2018  Total Protein  6.0 - 8.3 g/dL 6.9 6.9 6.5  Albumin 3.5 - 5.2 g/dL 4.1 4.3 4.2  AST 0 - 37 U/L 13 13 20   ALT 0 - 53 U/L 12 11 17   Alk Phosphatase 39 - 117 U/L 78 75 68  Total Bilirubin 0.2 - 1.2 mg/dL 0.9 1.0 0.9  Bilirubin, Direct 0.0 - 0.3 mg/dL 0.1 - -    Lab Results  Component Value Date/Time   TSH 1.11 12/26/2018 09:30 AM   TSH 0.84 02/17/2011 08:30 AM    CBC Latest Ref Rng & Units 12/26/2018  WBC 4.0 - 10.5 K/uL 6.2  Hemoglobin 13.0 - 17.0 g/dL 16.2  Hematocrit 39.0 - 52.0 % 46.3  Platelets 150.0 - 400.0 K/uL 188.0    Clinical ASCVD: Yes  The 10-year ASCVD risk score Mikey Bussing DC Jr., et al., 2013) is: 51.9%   Values used to calculate the score:     Age: 73 years     Sex: Male     Is Non-Hispanic African American: No     Diabetic: Yes     Tobacco smoker: Yes     Systolic Blood Pressure: 324 mmHg     Is BP treated: Yes     HDL Cholesterol: 30.1 mg/dL     Total Cholesterol: 146 mg/dL     Social History   Tobacco Use  Smoking Status Former Smoker  . Quit date: 11/20/1978  . Years since quitting: 41.5  Smokeless Tobacco Never Used  BP Readings from Last 3 Encounters:  05/13/20 120/80  03/10/20 140/80  12/10/19 118/70   Pulse Readings from Last 3 Encounters:  05/13/20 73  03/10/20 86  12/10/19 69   Wt Readings from Last 3 Encounters:  05/13/20 168 lb (76.2 kg)  03/10/20 187 lb 12.8 oz (85.2 kg)  02/10/20 182 lb (82.6 kg)    Assessment: Review of patient past medical history, allergies, medications, health status, including review of consultants reports, laboratory and other test data, was performed as part of comprehensive evaluation and provision of chronic care management services.   SDOH:  (Social Determinants of Health) assessments and interventions performed:  SDOH Interventions   Flowsheet Row Most Recent Value  SDOH Interventions   Financial Strain Interventions Other (Comment)  [manufacturer assistance]      CCM Care Plan  Allergies  Allergen Reactions   . Uloric [Febuxostat] Hives    hives    Medications Reviewed Today    Reviewed by Gordy Councilman, CMA (Certified Medical Assistant) on 05/13/20 at 1122  Med List Status: <None>  Medication Order Taking? Sig Documenting Provider Last Dose Status Informant  Accu-Chek FastClix Lancets MISC [id number] Yes USE UP TO FOUR TIMES DAILY AS DIRECTED Leone Haven, MD Taking Active   ACCU-CHEK GUIDE test strip [id number] Yes USE UP TO FOUR TIMES DAILY AS DIRECTED Leone Haven, MD Taking Active   allopurinol (ZYLOPRIM) 100 MG tablet [id number] Yes TAKE 1 TABLET DAILY Leone Haven, MD Taking Active   amLODipine (NORVASC) 10 MG tablet [id number] Yes TAKE 1 TABLET EVERY DAY Leone Haven, MD Taking Active   aspirin 325 MG tablet [id number] Yes Take 325 mg by mouth daily. [provider] Taking Active   atorvastatin (LIPITOR) 80 MG tablet [id number] Yes TAKE 1 TABLET EVERY DAY Leone Haven, MD Taking Active   B Complex Vitamins (VITAMIN-B COMPLEX PO) [id number] Yes Take by mouth. [provider] Taking Active   BETA CAROTENE PO [id number] Yes Take by mouth. [provider] Taking Active   blood glucose meter kit and supplies [id number] Yes Dispense based on patient and insurance preference. Use up to four times daily as directed. (FOR ICD-10 E10.9, E11.9). Leone Haven, MD Taking Active   Calcium Carbonate-Vitamin D (CALCIUM + D PO) [id number] Yes Take by mouth. [provider] Taking Active   doxazosin (CARDURA) 4 MG tablet [id number] Yes  [provider] Taking Active   Dulaglutide (TRULICITY) 4.5 SJ/6.2EZ SOPN [id number] Yes Inject 4.5 mg as directed once a week. Leone Haven, MD Taking Active            Med Note [name] Mar 04, 2020  1:18 PM) Using 3 mg   empagliflozin (JARDIANCE) 25 MG TABS tablet [id number] Yes Take 25 mg by mouth daily. Leone Haven, MD Taking Active   escitalopram (LEXAPRO) 20 MG tablet  [id number] Yes TAKE 1 TABLET (20 MG) BY MOUTH DAILY Leone Haven, MD Taking Active   furosemide (LASIX) 20 MG tablet [id number] Yes Take one tablet (20 mg total) by mouth daily. Leone Haven, MD Taking Active   gabapentin (NEURONTIN) 400 MG capsule [id number] Yes Take 1 capsule (400 mg total) by mouth 3 (three) times daily. Leone Haven, MD Taking Active   glipiZIDE (GLUCOTROL) 5 MG tablet [id number] Yes Take 1 tablet by mouth 2 (two) times daily. [provider] Taking Active   hydrALAZINE (APRESOLINE) 10 MG tablet [id number] Yes Take 1 tablet (  10 mg total) by mouth in the morning and at bedtime. Leone Haven, MD Taking Active   Insulin Glargine [facility]) 100 UNIT/ML [id number] Yes Inject 30 Units into the skin daily. Leone Haven, MD Taking Active            Med Note Darnelle Maffucci, Maralyn Sago Mar 04, 2020  1:17 PM) Taking 2 shots of 15 units  insulin lispro (HUMALOG KWIKPEN) 100 UNIT/ML KwikPen [id number] Yes Inject 10 units up to three times daily with meals Leone Haven, MD Taking Active            Med Note Leona Singleton   Mon Mar 08, 2020  1:47 PM) Reports taking 10 units TID with meals if blood sugars >200  Insulin Pen Needle (PEN NEEDLES) 32G X 4 MM MISC [id number] Yes Use to inject insulin up to 4 times daily Leone Haven, MD Taking Active   losartan (COZAAR) 100 MG tablet [id number] Yes TAKE 1 TABLET EVERY DAY Leone Haven, MD Taking Active   magnesium 30 MG tablet [id number] Yes Take 30 mg by mouth 2 (two) times daily. [provider] Taking Active   metFORMIN (GLUCOPHAGE XR) 500 MG 24 hr tablet [id number] Yes Take 2 tablets (1,000 mg total) by mouth in the morning and at bedtime. Leone Haven, MD Taking Active   metoprolol succinate (TOPROL-XL) 100 MG 24 hr tablet [id number] Yes TAKE 1 TABLET EVERY DAY Leone Haven, MD Taking Active   Multiple Vitamin (MULTIVITAMIN) tablet [id number] Yes Take 1 tablet by mouth  daily. [provider] Taking Active   pantoprazole (PROTONIX) 40 MG tablet [id number] Yes Take 1 tablet (40 mg total) by mouth daily. Leone Haven, MD Taking Active   potassium chloride (KLOR-CON) 10 MEQ tablet [id number] Yes TAKE 1 TABLET BY MOUTH DAILY WITH LASIX Leone Haven, MD Taking Active   Zinc Sulfate (ZINC 15 PO) [id number] Yes Take by mouth. [provider] Taking Active           Patient Active Problem List   Diagnosis Date Noted  . Headache 05/13/2020  . Overweight 05/30/2019  . Acute kidney failure ([facility]) 01/22/2019  . Benign hypertensive kidney disease with chronic kidney disease 01/22/2019  . Secondary hyperparathyroidism of renal origin ([place]) 01/22/2019  . Stage 3a chronic kidney disease ([place]) 01/22/2019  . Lightheadedness 12/19/2018  . Diarrhea 01/21/2018  . Skin lesions 01/21/2018  . Esophagitis   . Proteinuria 10/10/2017  . Onychomycosis 03/19/2017  . Anxiety and depression 09/19/2016  . Hypokalemia 09/19/2016  . Loss of height 05/01/2016  . Left shoulder pain 01/31/2016  . Prostate cancer screening 09/27/2012  . Sleep apnea 11/22/2011  . Chronic back pain 08/17/2011  . Hyperlipidemia 08/17/2011  . Gout 08/17/2011  . Type 2 diabetes, uncontrolled, with neuropathy ([place]) 02/20/2011  . Neuropathy ([facility]) 02/17/2011  . Hypertension 10/13/2010    Immunization History  Administered Date(s) Administered  . Fluad Quad(high Dose 65+) 09/25/2019  . Influenza Split 10/13/2010, 11/22/2011  . Influenza, High Dose Seasonal PF 11/01/2015, 10/30/2016, 10/10/2017  . Influenza,inj,Quad PF,6+ Mos 09/27/2012, 10/20/2013, 09/28/2014  . Moderna Sars-Covid-2 Vaccination 03/20/2019, 04/17/2019  . Pneumococcal Conjugate-13 04/14/2013  . Pneumococcal Polysaccharide-23 08/17/2011  . Tdap 08/16/2009    Conditions to be addressed/monitored: CAD, HTN, HLD, DMII and CKD  Care Plan : Medication Management  Updates made by De Hollingshead, RPH-CPP  since 06/07/2020 12:00 AM    Problem: Diabetes, Hypertension, CKD, Depression  Long-Range Goal: Disease Progression Prevention   This Visit's Progress: On track  Recent Progress: On track  Priority: High  Note:   Current Barriers:  . Unable to independently afford treatment regimen . Unable to independently monitor therapeutic efficacy . Unable to achieve control of diabetes   Pharmacist Clinical Goal(s):  Marland Kitchen Over the next 90 days, patient will verbalize ability to afford treatment regimen. . Over the next 90 days, patient will achieve control of diabetes as evidenced by improvement in A1c through collaboration with PharmD and provider.   Interventions: . 1:1 collaboration with Leone Haven, MD regarding development and update of comprehensive plan of care as evidenced by provider attestation and co-signature . Inter-disciplinary care team collaboration (see longitudinal plan of care) . Comprehensive medication review performed; medication list updated in electronic medical record  Diabetes: . Uncontrolled; current treatment: metformin XR 1000 mg BID, Jardiance 25 mg daily, Trulicity 4.5 mg; Basaglar 30-40 units daily (prefers to split and take 15-30 units in two separate shots) Humalog 10-12 units BID with meals (only eating 2 meals daily) . Approved for patient assistance for Basaglar, Humalog, Trulicity, and Jardiance through 2022 . Current glucose readings: Date of Download: 5/24-6/6  % Time CGM is active: 42% Average Glucose: 213 mg/dL Glucose Management Indicator: 8.4% Glucose Variability: 46.8 (goal <36%) Time in Goal:  - Time in range 70-180: 42% - Time above range: 53% - Time below range: 5% . Fasting hypoglycemia with significant excursions after his 11 am meal . Stop splitting Basaglar. Decrease Basaglar to a total of 26 units daily. Increase lunch Humalog to 14 units, keep supper Humalog at 12 units daily. Had patient teach back to me doing along with pen  colors.   Hypertension: . Controlled per clinic reaidng; current treatment: furosemide 20 mg QAM, losartan 100 mg QAM, metoprolol XL 100 mg QAM, amlodipine 10 mg daily QAM, doxazosin 4 mg QPM, hydralazine 10 mg BID . Previously recommended to continue current regimen at this time.  Hyperlipidemia: . Controlled per last lipid panel; current treatment: atorvastatin 40 mg daily . Antiplatelet regimen: aspirin 325 mg daily (hx CVA per chart review) . Previously recommended to continue current regimen at this time.   Depression: . Controlled per patient report; current treatment: escitalopram 20 mg daily . Previously recommended to continue current regimen at this time.  . Reports today that he would like a prescription for Viagra. Advised to discuss PCP at upcoming visit. Discussed impact of uncontrolled DM, CV disease, and antidepressants on libido and ability to maintain an erection.   Patient Goals/Self-Care Activities . Over the next 90 days, patient will:  - take medications as prescribed check glucose three times daily using CGM, document, and provide at future appointments check blood pressure periodically, document, and provide at future appointments collaborate with provider on medication access solutions  Follow Up Plan: ~ Telephone follow up appointment with care management team member scheduled for:~ 3 weeks        Medication Assistance: Basaglar, Trulicity, Humalog, Jardianceobtained through[place] and [employer] Caresmedication assistance program. Enrollment ends 01/01/21   Patient's preferred pharmacy is:  [id number] [address], [address] AT [address] [place] [address] Phone: [phone] Fax: [facility] [place], [place] [address] Phone: [phone] Fax: [phone]   Follow Up:  Patient agrees to Care  Plan and  Follow-up.  Plan: Telephone follow up appointment with care management team member scheduled for:  ~ 3 weeks  Catie Darnelle Maffucci, PharmD, [place], [place] Clinical Pharmacist [employer] at [employer] [phone]

## 2020-06-07 NOTE — Telephone Encounter (Signed)
Patient was calling to request a script for Viagra. Advised that Dr. Caryl Bis will likely want to discuss at an office visit with him prior to prescribing. Upcoming appointment on 06/29/20. Routing to PCP.

## 2020-06-07 NOTE — Telephone Encounter (Signed)
Returned call to patient. See CCM documentation

## 2020-06-07 NOTE — Telephone Encounter (Signed)
PT stated they did not want to provide any info about what is going on to me. PT would like a callback from [facility].

## 2020-06-07 NOTE — Telephone Encounter (Signed)
Noted. Needs to be discussed at upcoming office visit.

## 2020-06-14 ENCOUNTER — Telehealth: Payer: Self-pay | Admitting: *Deleted

## 2020-06-14 NOTE — Telephone Encounter (Signed)
  Care Management   Follow Up Note   06/14/2020 Name: Shane Jones MRN: [id number] DOB: 01/25/1945   Referred by: Leone Haven, MD Reason for referral : Case Closure (RNCM CCM Discipline Closure)   Third unsuccessful telephone outreach was attempted today. The patient was referred to the case management team for assistance with care management and care coordination. The patient's primary care provider has been notified of our unsuccessful attempts to make or maintain contact with the patient. The care management team is pleased to engage with this patient at any time in the future should he/she be interested in assistance from the care management team.   [facility] notified primary care provider of inability to reach patient on 05/28/20.  CCM Pharmacy team remains in contact with patient at this time.  Resolving RNCM goals and care plan at this time.  Follow Up Plan: We have been unable to make contact with the patient for follow up. The care management team is available to follow up with the patient after provider conversation with the patient regarding recommendation for care management engagement and subsequent re-referral to the care management team.     Hubert Azure RN, MSN RN Care Management Coordinator [place] [phone] [name]

## 2020-06-15 ENCOUNTER — Ambulatory Visit (INDEPENDENT_AMBULATORY_CARE_PROVIDER_SITE_OTHER): Payer: Medicare Other | Admitting: Pharmacist

## 2020-06-15 DIAGNOSIS — G629 Polyneuropathy, unspecified: Secondary | ICD-10-CM

## 2020-06-15 DIAGNOSIS — IMO0002 Reserved for concepts with insufficient information to code with codable children: Secondary | ICD-10-CM

## 2020-06-15 DIAGNOSIS — E785 Hyperlipidemia, unspecified: Secondary | ICD-10-CM

## 2020-06-15 DIAGNOSIS — E1140 Type 2 diabetes mellitus with diabetic neuropathy, unspecified: Secondary | ICD-10-CM | POA: Diagnosis not present

## 2020-06-15 DIAGNOSIS — F419 Anxiety disorder, unspecified: Secondary | ICD-10-CM | POA: Diagnosis not present

## 2020-06-15 DIAGNOSIS — I10 Essential (primary) hypertension: Secondary | ICD-10-CM

## 2020-06-15 DIAGNOSIS — F32A Depression, unspecified: Secondary | ICD-10-CM | POA: Diagnosis not present

## 2020-06-15 DIAGNOSIS — E1165 Type 2 diabetes mellitus with hyperglycemia: Secondary | ICD-10-CM | POA: Diagnosis not present

## 2020-06-15 NOTE — Chronic Care Management (AMB) (Signed)
Chronic Care Management Pharmacy Note  06/15/2020 Name:  Shane Jones MRN:  [id number] DOB:  [birth year]  Subjective: Shane Jones is an 75 y.o. year old male who is a primary patient of Caryl Bis, Angela Adam, MD.  The CCM team was consulted for assistance with disease management and care coordination needs.    Engaged with patient by telephone for  response to voicemail that he had "good news" for me  in response to provider referral for pharmacy case management and/or care coordination services.   Consent to Services:  The patient was given information about Chronic Care Management services, agreed to services, and gave verbal consent prior to initiation of services.  Please see initial visit note for detailed documentation.   Patient Care Team: Leone Haven, MD as PCP - General (Family Medicine) De Hollingshead, RPH-CPP as Pharmacist (Pharmacist)   Objective:  Lab Results  Component Value Date   CREATININE 1.65 (H) 03/10/2020   CREATININE 1.81 (H) 09/01/2019   CREATININE 1.85 (H) 01/14/2019    Lab Results  Component Value Date   HGBA1C 7.8 (H) 03/10/2020   Last diabetic Eye exam:  Lab Results  Component Value Date/Time   HMDIABEYEEXA No Retinopathy 04/16/2017 12:00 AM    Last diabetic Foot exam:  Lab Results  Component Value Date/Time   HMDIABFOOTEX Normal 07/27/2015 12:00 AM        Component Value Date/Time   CHOL 146 03/10/2020 1526   TRIG (H) 03/10/2020 1526    402.0 Triglyceride is over 400; calculations on Lipids are invalid.   HDL 30.10 (L) 03/10/2020 1526   CHOLHDL 5 03/10/2020 1526   VLDL 77.6 (H) 09/01/2019 1545   LDLCALC 23 08/07/2016 1156   LDLDIRECT 99.0 05/13/2020 1213    Hepatic Function Latest Ref Rng & Units 05/13/2020 09/01/2019 12/26/2018  Total Protein 6.0 - 8.3 g/dL 6.9 6.9 6.5  Albumin 3.5 - 5.2 g/dL 4.1 4.3 4.2  AST 0 - 37 U/L $Remo'13 13 20  'ZQeHo$ ALT 0 - 53 U/L $Remo'12 11 17  'VmaXM$ Alk Phosphatase 39 - 117 U/L 78 75 68  Total Bilirubin 0.2 - 1.2  mg/dL 0.9 1.0 0.9  Bilirubin, Direct 0.0 - 0.3 mg/dL 0.1 - -    Lab Results  Component Value Date/Time   TSH 1.11 12/26/2018 09:30 AM   TSH 0.84 02/17/2011 08:30 AM    CBC Latest Ref Rng & Units 12/26/2018  WBC 4.0 - 10.5 K/uL 6.2  Hemoglobin 13.0 - 17.0 g/dL 16.2  Hematocrit 39.0 - 52.0 % 46.3  Platelets 150.0 - 400.0 K/uL 188.0     Clinical ASCVD: No  The 10-year ASCVD risk score Mikey Bussing DC Jr., et al., 2013) is: 51.9%   Values used to calculate the score:     Age: 49 years     Sex: Male     Is Non-Hispanic African American: No     Diabetic: Yes     Tobacco smoker: Yes     Systolic Blood Pressure: 257 mmHg     Is BP treated: Yes     HDL Cholesterol: 30.1 mg/dL     Total Cholesterol: 146 mg/dL     Social History   Tobacco Use  Smoking Status Former   Pack years: 0.00   Types: Cigarettes   Quit date: 11/20/1978   Years since quitting: 41.5  Smokeless Tobacco Never   BP Readings from Last 3 Encounters:  05/13/20 120/80  03/10/20 140/80  12/10/19 118/70   Pulse Readings  from Last 3 Encounters:  05/13/20 73  03/10/20 86  12/10/19 69   Wt Readings from Last 3 Encounters:  05/13/20 168 lb (76.2 kg)  03/10/20 187 lb 12.8 oz (85.2 kg)  02/10/20 182 lb (82.6 kg)         Assessment: Review of patient past medical history, allergies, medications, health status, including review of consultants reports, laboratory and other test data, was performed as part of comprehensive evaluation and provision of chronic care management services.   SDOH:  (Social Determinants of Health) assessments and interventions performed:  SDOH Interventions    Flowsheet Row Most Recent Value  SDOH Interventions   Financial Strain Interventions Other (Comment)  [manufacturer assistance]       CCM Care Plan  Allergies  Allergen Reactions   Uloric [Febuxostat] Hives    hives    Medications Reviewed Today     Reviewed by Gordy Councilman, CMA (Certified Medical Assistant) on  05/13/20 at 1122  Med List Status: <None>   Medication Order Taking? Sig Documenting Provider Last Dose Status Informant  Accu-Chek FastClix Lancets MISC [id number] Yes USE UP TO FOUR TIMES DAILY AS DIRECTED Leone Haven, MD Taking Active   ACCU-CHEK GUIDE test strip [id number] Yes USE UP TO FOUR TIMES DAILY AS DIRECTED Leone Haven, MD Taking Active   allopurinol (ZYLOPRIM) 100 MG tablet [id number] Yes TAKE 1 TABLET DAILY Leone Haven, MD Taking Active   amLODipine (NORVASC) 10 MG tablet [id number] Yes TAKE 1 TABLET EVERY DAY Leone Haven, MD Taking Active   aspirin 325 MG tablet [id number] Yes Take 325 mg by mouth daily. [provider] Taking Active   atorvastatin (LIPITOR) 80 MG tablet [id number] Yes TAKE 1 TABLET EVERY DAY Leone Haven, MD Taking Active   B Complex Vitamins (VITAMIN-B COMPLEX PO) [id number] Yes Take by mouth. [provider] Taking Active   BETA CAROTENE PO [id number] Yes Take by mouth. [provider] Taking Active   blood glucose meter kit and supplies [id number] Yes Dispense based on patient and insurance preference. Use up to four times daily as directed. (FOR ICD-10 E10.9, E11.9). Leone Haven, MD Taking Active   Calcium Carbonate-Vitamin D (CALCIUM + D PO) [id number] Yes Take by mouth. [provider] Taking Active   doxazosin (CARDURA) 4 MG tablet [id number] Yes  [provider] Taking Active   Dulaglutide (TRULICITY) 4.5 FM/3.8GY SOPN [id number] Yes Inject 4.5 mg as directed once a week. Leone Haven, MD Taking Active            Med Note [name] Mar 04, 2020  1:18 PM) Using 3 mg   empagliflozin (JARDIANCE) 25 MG TABS tablet [id number] Yes Take 25 mg by mouth daily. Leone Haven, MD Taking Active   escitalopram (LEXAPRO) 20 MG tablet [id number] Yes TAKE 1 TABLET (20 MG) BY MOUTH DAILY Leone Haven, MD Taking Active   furosemide (LASIX) 20 MG tablet [id number] Yes Take  one tablet (20 mg total) by mouth daily. Leone Haven, MD Taking Active   gabapentin (NEURONTIN) 400 MG capsule [id number] Yes Take 1 capsule (400 mg total) by mouth 3 (three) times daily. Leone Haven, MD Taking Active   glipiZIDE (GLUCOTROL) 5 MG tablet [id number] Yes Take 1 tablet by mouth 2 (two) times daily. [provider] Taking Active   hydrALAZINE (APRESOLINE) 10 MG tablet [id number] Yes Take 1 tablet (10 mg total) by mouth in the morning and at  bedtime. Glori Luis, MD Taking Active   Insulin Glargine [facility]) 100 UNIT/ML [id number] Yes Inject 30 Units into the skin daily. Glori Luis, MD Taking Active            Med Note Feliz Beam, Andris Flurry Mar 04, 2020  1:17 PM) Taking 2 shots of 15 units  insulin lispro (HUMALOG KWIKPEN) 100 UNIT/ML KwikPen [id number] Yes Inject 10 units up to three times daily with meals Glori Luis, MD Taking Active            Med Note Maple Mirza   Mon Mar 08, 2020  1:47 PM) Reports taking 10 units TID with meals if blood sugars >200  Insulin Pen Needle (PEN NEEDLES) 32G X 4 MM MISC [id number] Yes Use to inject insulin up to 4 times daily Glori Luis, MD Taking Active   losartan (COZAAR) 100 MG tablet [id number] Yes TAKE 1 TABLET EVERY DAY Glori Luis, MD Taking Active   magnesium 30 MG tablet [id number] Yes Take 30 mg by mouth 2 (two) times daily. [provider] Taking Active   metFORMIN (GLUCOPHAGE XR) 500 MG 24 hr tablet [id number] Yes Take 2 tablets (1,000 mg total) by mouth in the morning and at bedtime. Glori Luis, MD Taking Active   metoprolol succinate (TOPROL-XL) 100 MG 24 hr tablet [id number] Yes TAKE 1 TABLET EVERY DAY Glori Luis, MD Taking Active   Multiple Vitamin (MULTIVITAMIN) tablet [id number] Yes Take 1 tablet by mouth daily. [provider] Taking Active   pantoprazole (PROTONIX) 40 MG tablet [id number] Yes Take 1 tablet (40 mg total) by mouth daily.  Glori Luis, MD Taking Active   potassium chloride (KLOR-CON) 10 MEQ tablet [id number] Yes TAKE 1 TABLET BY MOUTH DAILY WITH LASIX Glori Luis, MD Taking Active   Zinc Sulfate (ZINC 15 PO) [id number] Yes Take by mouth. [provider] Taking Active             Patient Active Problem List   Diagnosis Date Noted   Headache 05/13/2020   Overweight 05/30/2019   Acute kidney failure (HCC) 01/22/2019   Benign hypertensive kidney disease with chronic kidney disease 01/22/2019   Secondary hyperparathyroidism of renal origin (HCC) 01/22/2019   Stage 3a chronic kidney disease (HCC) 01/22/2019   Lightheadedness 12/19/2018   Diarrhea 01/21/2018   Skin lesions 01/21/2018   Esophagitis    Proteinuria 10/10/2017   Onychomycosis 03/19/2017   Anxiety and depression 09/19/2016   Hypokalemia 09/19/2016   Loss of height 05/01/2016   Left shoulder pain 01/31/2016   Prostate cancer screening 09/27/2012   Sleep apnea 11/22/2011   Chronic back pain 08/17/2011   Hyperlipidemia 08/17/2011   Gout 08/17/2011   Type 2 diabetes, uncontrolled, with neuropathy (HCC) 02/20/2011   Neuropathy (HCC) 02/17/2011   Hypertension 10/13/2010    Immunization History  Administered Date(s) Administered   Fluad Quad(high Dose 65+) 09/25/2019   Influenza Split 10/13/2010, 11/22/2011   Influenza, High Dose Seasonal PF 11/01/2015, 10/30/2016, 10/10/2017   Influenza,inj,Quad PF,6+ Mos 09/27/2012, 10/20/2013, 09/28/2014   Moderna Sars-Covid-2 Vaccination 03/20/2019, 04/17/2019   Pneumococcal Conjugate-13 04/14/2013   Pneumococcal Polysaccharide-23 08/17/2011   Tdap 08/16/2009    Conditions to be addressed/monitored: HTN, HLD, and DMII  Care Plan : Medication Management  Updates made by Lourena Simmonds, RPH-CPP since 06/15/2020 12:00 AM     Problem: Diabetes, Hypertension, CKD, Depression      Long-Range Goal: Disease Progression Prevention  This Visit's Progress: On track  Recent  Progress: On track  Priority: High  Note:   Current Barriers:  Unable to independently afford treatment regimen Unable to independently monitor therapeutic efficacy Unable to achieve control of diabetes   Pharmacist Clinical Goal(s):  Over the next 90 days, patient will verbalize ability to afford treatment regimen. Over the next 90 days, patient will achieve control of diabetes as evidenced by improvement in A1c through collaboration with PharmD and provider.   Interventions: 1:1 collaboration with Leone Haven, MD regarding development and update of comprehensive plan of care as evidenced by provider attestation and co-signature Inter-disciplinary care team collaboration (see longitudinal plan of care) Comprehensive medication review performed; medication list updated in electronic medical record  Diabetes: Uncontrolled; current treatment: metformin XR 1000 mg BID, Jardiance 25 mg daily, Trulicity 4.5 mg; Basaglar 26 units, Humalog 16 units once daily - advised to take 14 units with breakfast and 12 units with supper Approved for patient assistance for Basaglar, Humalog, Trulicity, and Jardiance through 2022 Reports that glucose readings are significantly better controlled now that he isn't splitting the [place]. However, does note that he is taking Humalog 16 units when he wakes up, not specifically with a meal.  Praised for improvement in glucose readings, but advised that he should not be taking Humalog without immediately eating a meal. Some lows that appear to be pre-prandial.   He also endorses improvement in neuropathy with recent glycemic control.  Discussed reducing Basaglar due to pre-prandial lows. He declines, would like to continue regimen of Basaglar 26 units daily with Humalog 12-14 units with a meal. F/u with myself and PCP in ~ 2 weeks as scheduled.   Hypertension: Controlled per clinic reaidng; current treatment: furosemide 20 mg QAM, losartan 100 mg QAM,  metoprolol XL 100 mg QAM, amlodipine 10 mg daily QAM, doxazosin 4 mg QPM, hydralazine 10 mg BID Previously recommended to continue current regimen at this time.  Hyperlipidemia: Controlled per last lipid panel; current treatment: atorvastatin 40 mg daily Antiplatelet regimen: aspirin 325 mg daily (hx CVA per chart review) Previously recommended to continue current regimen at this time.   Depression: Controlled per patient report; current treatment: escitalopram 20 mg daily Previously recommended to continue current regimen at this time.  Previously reported hat he would like a prescription for Viagra. Advised to discuss PCP at upcoming visit.  Patient Goals/Self-Care Activities Over the next 90 days, patient will:  - take medications as prescribed check glucose three times daily using CGM, document, and provide at future appointments check blood pressure periodically, document, and provide at future appointments collaborate with provider on medication access solutions  Follow Up Plan: ~ Face to Face appointment with care management team member scheduled for: ~ 2 weeks        Medication Assistance: [place], Trulicity, Humalog, Jardiance obtained through [place] medication assistance program.  Enrollment ends 01/01/21  Patient's preferred pharmacy is:  [id number] [address], [place] [address] Phone: [phone] Fax: [facility] [place], [place] [address] Phone: [phone] Fax: [phone]    Follow Up:  Patient agrees to Care Plan and Follow-up.  Plan: Face to Face appointment with care management team member scheduled for: 2 weeks  Catie Darnelle Maffucci, PharmD, [place], CPP Clinical Pharmacist [employer] at [place]  [facility] [phone]

## 2020-06-15 NOTE — Patient Instructions (Signed)
Visit Information  PATIENT GOALS:  Goals Addressed               This Visit's Progress     Patient Stated     Medication Monitoring (pt-stated)        Patient Goals/Self-Care Activities Over the next 90 days, patient will:  - take medications as prescribed check glucose three times daily using CGM, document, and provide at future appointments check blood pressure periodically, document, and provide at future appointments collaborate with provider on medication access solutions         Patient verbalizes understanding of instructions provided today and agrees to view in [place].    Plan: Face to Face appointment with care management team member scheduled for: 2 weeks  Catie Darnelle Maffucci, PharmD, [place], [place] Clinical Pharmacist [employer] at [employer] [phone]

## 2020-06-17 ENCOUNTER — Ambulatory Visit: Payer: Medicare Other | Admitting: Pharmacist

## 2020-06-17 DIAGNOSIS — E785 Hyperlipidemia, unspecified: Secondary | ICD-10-CM

## 2020-06-17 DIAGNOSIS — I10 Essential (primary) hypertension: Secondary | ICD-10-CM

## 2020-06-17 DIAGNOSIS — IMO0002 Reserved for concepts with insufficient information to code with codable children: Secondary | ICD-10-CM

## 2020-06-17 NOTE — Chronic Care Management (AMB) (Signed)
Chronic Care Management Pharmacy Note  06/17/2020 Name:  Shane Jones MRN:  [id number] DOB:  1945-03-08   Subjective: Shane Jones is an 75 y.o. year old male who is a primary patient of Caryl Bis, Angela Adam, MD.  The CCM team was consulted for assistance with disease management and care coordination needs.    Engaged with patient by telephone for  response to patient question  in response to provider referral for pharmacy case management and/or care coordination services.   Consent to Services:  The patient was given information about Chronic Care Management services, agreed to services, and gave verbal consent prior to initiation of services.  Please see initial visit note for detailed documentation.   Patient Care Team: Leone Haven, MD as PCP - General (Family Medicine) De Hollingshead, RPH-CPP as Pharmacist (Pharmacist)   Objective:  Lab Results  Component Value Date   CREATININE 1.65 (H) 03/10/2020   CREATININE 1.81 (H) 09/01/2019   CREATININE 1.85 (H) 01/14/2019    Lab Results  Component Value Date   HGBA1C 7.8 (H) 03/10/2020   Last diabetic Eye exam:  Lab Results  Component Value Date/Time   HMDIABEYEEXA No Retinopathy 04/16/2017 12:00 AM    Last diabetic Foot exam:  Lab Results  Component Value Date/Time   HMDIABFOOTEX Normal 07/27/2015 12:00 AM        Component Value Date/Time   CHOL 146 03/10/2020 1526   TRIG (H) 03/10/2020 1526    402.0 Triglyceride is over 400; calculations on Lipids are invalid.   HDL 30.10 (L) 03/10/2020 1526   CHOLHDL 5 03/10/2020 1526   VLDL 77.6 (H) 09/01/2019 1545   LDLCALC 23 08/07/2016 1156   LDLDIRECT 99.0 05/13/2020 1213    Hepatic Function Latest Ref Rng & Units 05/13/2020 09/01/2019 12/26/2018  Total Protein 6.0 - 8.3 g/dL 6.9 6.9 6.5  Albumin 3.5 - 5.2 g/dL 4.1 4.3 4.2  AST 0 - 37 U/L 13 13 20   ALT 0 - 53 U/L 12 11 17   Alk Phosphatase 39 - 117 U/L 78 75 68  Total Bilirubin 0.2 - 1.2 mg/dL 0.9 1.0 0.9   Bilirubin, Direct 0.0 - 0.3 mg/dL 0.1 - -    Lab Results  Component Value Date/Time   TSH 1.11 12/26/2018 09:30 AM   TSH 0.84 02/17/2011 08:30 AM    CBC Latest Ref Rng & Units 12/26/2018  WBC 4.0 - 10.5 K/uL 6.2  Hemoglobin 13.0 - 17.0 g/dL 16.2  Hematocrit 39.0 - 52.0 % 46.3  Platelets 150.0 - 400.0 K/uL 188.0    No results found for: VD25OH  Clinical ASCVD: Yes  The 10-year ASCVD risk score Mikey Bussing DC Jr., et al., 2013) is: 51.9%   Values used to calculate the score:     Age: 52 years     Sex: Male     Is Non-Hispanic African American: No     Diabetic: Yes     Tobacco smoker: Yes     Systolic Blood Pressure: 485 mmHg     Is BP treated: Yes     HDL Cholesterol: 30.1 mg/dL     Total Cholesterol: 146 mg/dL     Social History   Tobacco Use  Smoking Status Former   Pack years: 0.00   Types: Cigarettes   Quit date: 11/20/1978   Years since quitting: 41.6  Smokeless Tobacco Never   BP Readings from Last 3 Encounters:  05/13/20 120/80  03/10/20 140/80  12/10/19 118/70   Pulse Readings  from Last 3 Encounters:  05/13/20 73  03/10/20 86  12/10/19 69   Wt Readings from Last 3 Encounters:  05/13/20 168 lb (76.2 kg)  03/10/20 187 lb 12.8 oz (85.2 kg)  02/10/20 182 lb (82.6 kg)    Assessment: Review of patient past medical history, allergies, medications, health status, including review of consultants reports, laboratory and other test data, was performed as part of comprehensive evaluation and provision of chronic care management services.   SDOH:  (Social Determinants of Health) assessments and interventions performed:    CCM Care Plan  Allergies  Allergen Reactions   Uloric [Febuxostat] Hives    hives    Medications Reviewed Today     Reviewed by Gordy Councilman, CMA (Certified Medical Assistant) on 05/13/20 at 1122  Med List Status: <None>   Medication Order Taking? Sig Documenting Provider Last Dose Status Informant  Accu-Chek FastClix Lancets MISC  [id number] Yes USE UP TO FOUR TIMES DAILY AS DIRECTED Leone Haven, MD Taking Active   ACCU-CHEK GUIDE test strip [id number] Yes USE UP TO FOUR TIMES DAILY AS DIRECTED Leone Haven, MD Taking Active   allopurinol (ZYLOPRIM) 100 MG tablet [id number] Yes TAKE 1 TABLET DAILY Leone Haven, MD Taking Active   amLODipine (NORVASC) 10 MG tablet [id number] Yes TAKE 1 TABLET EVERY DAY Leone Haven, MD Taking Active   aspirin 325 MG tablet [id number] Yes Take 325 mg by mouth daily. [provider] Taking Active   atorvastatin (LIPITOR) 80 MG tablet [id number] Yes TAKE 1 TABLET EVERY DAY Leone Haven, MD Taking Active   B Complex Vitamins (VITAMIN-B COMPLEX PO) [id number] Yes Take by mouth. [provider] Taking Active   BETA CAROTENE PO [id number] Yes Take by mouth. [provider] Taking Active   blood glucose meter kit and supplies [id number] Yes Dispense based on patient and insurance preference. Use up to four times daily as directed. (FOR ICD-10 E10.9, E11.9). Leone Haven, MD Taking Active   Calcium Carbonate-Vitamin D (CALCIUM + D PO) [id number] Yes Take by mouth. [provider] Taking Active   doxazosin (CARDURA) 4 MG tablet [id number] Yes  [provider] Taking Active   Dulaglutide (TRULICITY) 4.5 MP/5.3IR SOPN [id number] Yes Inject 4.5 mg as directed once a week. Leone Haven, MD Taking Active            Med Note [name] Mar 04, 2020  1:18 PM) Using 3 mg   empagliflozin (JARDIANCE) 25 MG TABS tablet [id number] Yes Take 25 mg by mouth daily. Leone Haven, MD Taking Active   escitalopram (LEXAPRO) 20 MG tablet [id number] Yes TAKE 1 TABLET (20 MG) BY MOUTH DAILY Leone Haven, MD Taking Active   furosemide (LASIX) 20 MG tablet [id number] Yes Take one tablet (20 mg total) by mouth daily. Leone Haven, MD Taking Active   gabapentin (NEURONTIN) 400 MG capsule [id number] Yes Take 1 capsule (400 mg  total) by mouth 3 (three) times daily. Leone Haven, MD Taking Active   glipiZIDE (GLUCOTROL) 5 MG tablet [id number] Yes Take 1 tablet by mouth 2 (two) times daily. [provider] Taking Active   hydrALAZINE (APRESOLINE) 10 MG tablet [id number] Yes Take 1 tablet (10 mg total) by mouth in the morning and at bedtime. Leone Haven, MD Taking Active   Insulin Glargine [employer]) 100 UNIT/ML [id number] Yes Inject 30 Units into the skin daily. Leone Haven, MD Taking Active  Med Note Darnelle Maffucci, Magdiel Bartles E   Thu Mar 04, 2020  1:17 PM) Taking 2 shots of 15 units  insulin lispro (HUMALOG KWIKPEN) 100 UNIT/ML KwikPen [id number] Yes Inject 10 units up to three times daily with meals Leone Haven, MD Taking Active            Med Note Leona Singleton   Mon Mar 08, 2020  1:47 PM) Reports taking 10 units TID with meals if blood sugars >200  Insulin Pen Needle (PEN NEEDLES) 32G X 4 MM MISC [id number] Yes Use to inject insulin up to 4 times daily Leone Haven, MD Taking Active   losartan (COZAAR) 100 MG tablet [id number] Yes TAKE 1 TABLET EVERY DAY Leone Haven, MD Taking Active   magnesium 30 MG tablet [id number] Yes Take 30 mg by mouth 2 (two) times daily. [provider] Taking Active   metFORMIN (GLUCOPHAGE XR) 500 MG 24 hr tablet [id number] Yes Take 2 tablets (1,000 mg total) by mouth in the morning and at bedtime. Leone Haven, MD Taking Active   metoprolol succinate (TOPROL-XL) 100 MG 24 hr tablet [id number] Yes TAKE 1 TABLET EVERY DAY Leone Haven, MD Taking Active   Multiple Vitamin (MULTIVITAMIN) tablet [id number] Yes Take 1 tablet by mouth daily. [provider] Taking Active   pantoprazole (PROTONIX) 40 MG tablet [id number] Yes Take 1 tablet (40 mg total) by mouth daily. Leone Haven, MD Taking Active   potassium chloride (KLOR-CON) 10 MEQ tablet [id number] Yes TAKE 1 TABLET BY MOUTH DAILY WITH LASIX Leone Haven, MD Taking Active   Zinc Sulfate (ZINC 15 PO) [id number] Yes Take by mouth. [provider] Taking Active             Patient Active Problem List   Diagnosis Date Noted   Headache 05/13/2020   Overweight 05/30/2019   Acute kidney failure ([place]) 01/22/2019   Benign hypertensive kidney disease with chronic kidney disease 01/22/2019   Secondary hyperparathyroidism of renal origin ([place]) 01/22/2019   Stage 3a chronic kidney disease ([place]) 01/22/2019   Lightheadedness 12/19/2018   Diarrhea 01/21/2018   Skin lesions 01/21/2018   Esophagitis    Proteinuria 10/10/2017   Onychomycosis 03/19/2017   Anxiety and depression 09/19/2016   Hypokalemia 09/19/2016   Loss of height 05/01/2016   Left shoulder pain 01/31/2016   Prostate cancer screening 09/27/2012   Sleep apnea 11/22/2011   Chronic back pain 08/17/2011   Hyperlipidemia 08/17/2011   Gout 08/17/2011   Type 2 diabetes, uncontrolled, with neuropathy ([place]) 02/20/2011   Neuropathy ([place]) 02/17/2011   Hypertension 10/13/2010    Immunization History  Administered Date(s) Administered   Fluad Quad(high Dose 65+) 09/25/2019   Influenza Split 10/13/2010, 11/22/2011   Influenza, High Dose Seasonal PF 11/01/2015, 10/30/2016, 10/10/2017   Influenza,inj,Quad PF,6+ Mos 09/27/2012, 10/20/2013, 09/28/2014   Moderna Sars-Covid-2 Vaccination 03/20/2019, 04/17/2019   Pneumococcal Conjugate-13 04/14/2013   Pneumococcal Polysaccharide-23 08/17/2011   Tdap 08/16/2009    Conditions to be addressed/monitored: HTN, HLD, DMII, and CKD  Care Plan : Medication Management  Updates made by De Hollingshead, RPH-CPP since 06/17/2020 12:00 AM     Problem: Diabetes, Hypertension, CKD, Depression      Long-Range Goal: Disease Progression Prevention   Recent Progress: On track  Priority: High  Note:   Current Barriers:  Unable to independently afford treatment regimen Unable to independently monitor therapeutic efficacy Unable to  achieve control of diabetes   Pharmacist Clinical Goal(s):  Over  the next 90 days, patient will verbalize ability to afford treatment regimen. Over the next 90 days, patient will achieve control of diabetes as evidenced by improvement in A1c through collaboration with PharmD and provider.   Interventions: 1:1 collaboration with Leone Haven, MD regarding development and update of comprehensive plan of care as evidenced by provider attestation and co-signature Inter-disciplinary care team collaboration (see longitudinal plan of care) Comprehensive medication review performed; medication list updated in electronic medical record  Diabetes: Uncontrolled; current treatment: metformin XR 1000 mg BID, Jardiance 25 mg daily, Trulicity 4.5 mg; Basaglar 26 units, Humalog 16 units once daily - advised to take 14 units with breakfast and 12 units with supper Approved for patient assistance for Basaglar, Humalog, Trulicity, and Jardiance through 2022 Calls today to report that he had a sensor not work. Requests a refill to [facility] if he can get a sample while waiting on new supply to arrive.  Reminded to contact manufacturer to report a sensor that did not work and to request replacement. Will provide a sensor sample while he is waiting Refill request for [place] 2 sensors placed to [facility] today via [employer].  Hypertension: Controlled per clinic reaidng; current treatment: furosemide 20 mg QAM, losartan 100 mg QAM, metoprolol XL 100 mg QAM, amlodipine 10 mg daily QAM, doxazosin 4 mg QPM, hydralazine 10 mg BID Previously recommended to continue current regimen at this time.  Hyperlipidemia: Controlled per last lipid panel; current treatment: atorvastatin 40 mg daily Antiplatelet regimen: aspirin 325 mg daily (hx CVA per chart review) Previously recommended to continue current regimen at this time.   Depression: Controlled per patient report; current treatment: escitalopram 20 mg  daily Previously recommended to continue current regimen at this time.  Previously reported hat he would like a prescription for Viagra. Advised to discuss PCP at upcoming visit.  Patient Goals/Self-Care Activities Over the next 90 days, patient will:  - take medications as prescribed check glucose three times daily using CGM, document, and provide at future appointments check blood pressure periodically, document, and provide at future appointments collaborate with provider on medication access solutions  Follow Up Plan:  Telephone follow up appointment with care management team member scheduled for: ~ 6 weeks        Medication Assistance: [place], Trulicity, Humalog, Jardiance obtained through [place] medication assistance program.  Enrollment ends 01/01/21  Patient's preferred pharmacy is:  [id number] [address], [place] [address] Phone: [phone] Fax: [phone]  [facility] - [place], [address] [place] [address] Phone: [phone] Fax: [phone]   Follow Up:  Patient agrees to Care Plan and Follow-up.  Plan: Telephone follow up appointment with care management team member scheduled for:  ~ 6 weeks  Catie Darnelle Maffucci, PharmD, [place], [place] Clinical Pharmacist [employer] [facility] [employer] [phone]

## 2020-06-17 NOTE — Patient Instructions (Signed)
Visit Information  PATIENT GOALS:  Goals Addressed               This Visit's Progress     Patient Stated     Medication Monitoring (pt-stated)        Patient Goals/Self-Care Activities Over the next 90 days, patient will:  - take medications as prescribed check glucose three times daily using CGM, document, and provide at future appointments check blood pressure periodically, document, and provide at future appointments collaborate with provider on medication access solutions          Patient verbalizes understanding of instructions provided today and agrees to view in [place].  Plan: Telephone follow up appointment with care management team member scheduled for:  ~ 6 weeks  Catie Darnelle Maffucci, PharmD, [place], [place] Clinical Pharmacist [employer] at [employer] [phone]

## 2020-06-22 ENCOUNTER — Telehealth: Payer: Self-pay | Admitting: Family Medicine

## 2020-06-22 NOTE — Telephone Encounter (Signed)
Contacted patient. Odd that he received a bill for a visit in 2021 when he has not received bills for CCM services before or after that time. Advised that he contact [facility] at [phone].

## 2020-06-22 NOTE — Telephone Encounter (Signed)
Patient called and needs help on a charge of $39.59, service date 01/20/2019. Telephone call with Catie. He has never been charged for seeing Catie before and is wondering why he owes this amount.

## 2020-06-23 ENCOUNTER — Telehealth: Payer: Medicare Other

## 2020-06-29 ENCOUNTER — Other Ambulatory Visit: Payer: Self-pay

## 2020-06-29 ENCOUNTER — Encounter: Payer: Self-pay | Admitting: Family Medicine

## 2020-06-29 ENCOUNTER — Ambulatory Visit (INDEPENDENT_AMBULATORY_CARE_PROVIDER_SITE_OTHER): Payer: Medicare Other | Admitting: Family Medicine

## 2020-06-29 DIAGNOSIS — IMO0002 Reserved for concepts with insufficient information to code with codable children: Secondary | ICD-10-CM

## 2020-06-29 DIAGNOSIS — N529 Male erectile dysfunction, unspecified: Secondary | ICD-10-CM | POA: Insufficient documentation

## 2020-06-29 DIAGNOSIS — S90861A Insect bite (nonvenomous), right foot, initial encounter: Secondary | ICD-10-CM | POA: Diagnosis not present

## 2020-06-29 DIAGNOSIS — E785 Hyperlipidemia, unspecified: Secondary | ICD-10-CM

## 2020-06-29 DIAGNOSIS — E1165 Type 2 diabetes mellitus with hyperglycemia: Secondary | ICD-10-CM

## 2020-06-29 DIAGNOSIS — W57XXXA Bitten or stung by nonvenomous insect and other nonvenomous arthropods, initial encounter: Secondary | ICD-10-CM | POA: Diagnosis not present

## 2020-06-29 DIAGNOSIS — E1140 Type 2 diabetes mellitus with diabetic neuropathy, unspecified: Secondary | ICD-10-CM | POA: Diagnosis not present

## 2020-06-29 LAB — POCT GLYCOSYLATED HEMOGLOBIN (HGB A1C): Hemoglobin A1C: 8.6 % — AB (ref 4.0–5.6)

## 2020-06-29 MED ORDER — ESCITALOPRAM OXALATE 20 MG PO TABS: ORAL_TABLET | ORAL | 1 refills | Status: DC

## 2020-06-29 MED ORDER — SILDENAFIL CITRATE 50 MG PO TABS: 50.0000 mg | ORAL_TABLET | Freq: Every day | ORAL | 0 refills | Status: DC | PRN

## 2020-06-29 MED ORDER — EZETIMIBE 10 MG PO TABS: 10.0000 mg | ORAL_TABLET | Freq: Every day | ORAL | 3 refills | Status: DC

## 2020-06-29 NOTE — Progress Notes (Signed)
[name], MD Phone: [phone]  Shane Jones is a 75 y.o. male who presents today for f/u.  DIABETES Disease Monitoring: Blood Sugar ranges-48% in range, 7% low, 45% above range Polyuria/phagia/dipsia- no change to chronic polydipsia      Optho- due, missed his appointment Medications: Compliance- taking trulicity 45 mg weekly, jardiance, basaglar 26 u daily, humalog 16 u with his first meal of the day, and occasionally take 16 units with his evening meal though not always Hypoglycemic symptoms- no  HYPERLIPIDEMIA Symptoms Chest pain on exertion:  no   Leg claudication:   no Medications: Compliance- taking lipitor Right upper quadrant pain- no  Muscle aches- no  Erectile dysfunction: Patient notes he is unable to get an erection that is as strong as it was previously.  He is eventually able to ejaculate.  No pain with erections.  He is wondering about Viagra.  Tick bite: Patient reports removing a tick several days ago from his right fifth toe.  Notes it was slightly engorged.  He has had no rash.  He notes no fevers.  He does not feel ill.  He is unsure how long the tick was on him.  Social History   Tobacco Use  Smoking Status Former  . Pack years: 0.00  . Types: Cigarettes  . Quit date: 11/20/1978  . Years since quitting: 41.6  Smokeless Tobacco Never    Current Outpatient Medications on File Prior to Visit  Medication Sig Dispense Refill  . Accu-Chek FastClix Lancets MISC USE UP TO FOUR TIMES DAILY AS DIRECTED 102 each 2  . ACCU-CHEK GUIDE test strip USE UP TO FOUR TIMES DAILY AS DIRECTED 100 each 2  . allopurinol (ZYLOPRIM) 100 MG tablet TAKE 1 TABLET DAILY 90 tablet 03  . amLODipine (NORVASC) 10 MG tablet TAKE 1 TABLET EVERY DAY 90 tablet 2  . aspirin 325 MG tablet Take 325 mg by mouth daily.    Marland Kitchen atorvastatin (LIPITOR) 80 MG tablet TAKE 1 TABLET EVERY DAY 90 tablet 3  . B Complex Vitamins (VITAMIN-B COMPLEX PO) Take by mouth.    Marland Kitchen BETA CAROTENE PO Take by  mouth.    . blood glucose meter kit and supplies Dispense based on patient and insurance preference. Use up to four times daily as directed. (FOR ICD-10 E10.9, E11.9). 1 each 0  . Calcium Carbonate-Vitamin D (CALCIUM + D PO) Take by mouth.    . doxazosin (CARDURA) 4 MG tablet     . Dulaglutide (TRULICITY) 4.5 UJ/8.1XB SOPN Inject 4.5 mg as directed once a week. 6 mL 1  . empagliflozin (JARDIANCE) 25 MG TABS tablet Take 25 mg by mouth daily. 90 tablet 3  . escitalopram (LEXAPRO) 20 MG tablet TAKE 1 TABLET (20 MG) BY MOUTH DAILY 90 tablet 1  . furosemide (LASIX) 20 MG tablet Take one tablet (20 mg total) by mouth daily. 90 tablet 1  . gabapentin (NEURONTIN) 400 MG capsule Take 1 capsule (400 mg total) by mouth 3 (three) times daily. 270 capsule 0  . hydrALAZINE (APRESOLINE) 10 MG tablet TAKE 1 TABLET(10 MG) BY MOUTH IN THE MORNING AND AT BEDTIME 60 tablet 2  . hydrALAZINE (APRESOLINE) 10 MG tablet TAKE 1 TABLET(10 MG) BY MOUTH IN THE MORNING AND AT BEDTIME 60 tablet 2  . Insulin Glargine (BASAGLAR KWIKPEN) 100 UNIT/ML Inject 26 Units into the skin daily. 15 mL 0  . insulin lispro (HUMALOG KWIKPEN) 100 UNIT/ML KwikPen Inject 14 units with breakfast, 12 units with supper 3 mL  0  . Insulin Pen Needle (PEN NEEDLES) 32G X 4 MM MISC Use to inject insulin up to 4 times daily 400 each 3  . losartan (COZAAR) 100 MG tablet TAKE 1 TABLET EVERY DAY 90 tablet 2  . magnesium 30 MG tablet Take 30 mg by mouth 2 (two) times daily.    . metFORMIN (GLUCOPHAGE XR) 500 MG 24 hr tablet Take 2 tablets (1,000 mg total) by mouth in the morning and at bedtime. 360 tablet 3  . metoprolol succinate (TOPROL-XL) 100 MG 24 hr tablet TAKE 1 TABLET EVERY DAY 90 tablet 1  . Multiple Vitamin (MULTIVITAMIN) tablet Take 1 tablet by mouth daily.    . pantoprazole (PROTONIX) 40 MG tablet Take 1 tablet (40 mg total) by mouth daily. 90 tablet 0  . potassium chloride (KLOR-CON) 10 MEQ tablet TAKE 1 TABLET BY MOUTH DAILY WITH LASIX 90  tablet 0  . Zinc Sulfate (ZINC 15 PO) Take by mouth.     No current facility-administered medications on file prior to visit.     ROS see history of present illness  Objective  Physical Exam Vitals:   06/29/20 1211  BP: 140/80  Pulse: 87  Temp: 98.1 F (36.7 C)  SpO2: 97%    BP Readings from Last 3 Encounters:  06/29/20 140/80  05/13/20 120/80  03/10/20 140/80   Wt Readings from Last 3 Encounters:  06/29/20 172 lb 9.6 oz (78.3 kg)  05/13/20 168 lb (76.2 kg)  03/10/20 187 lb 12.8 oz (85.2 kg)    Physical Exam Constitutional:      General: He is not in acute distress.    Appearance: He is not diaphoretic.  Cardiovascular:     Rate and Rhythm: Normal rate and regular rhythm.     Heart sounds: Normal heart sounds.  Pulmonary:     Effort: Pulmonary effort is normal.     Breath sounds: Normal breath sounds.  Genitourinary:    Comments: Normal circumcised penis, normal testicles, normal scrotum, normal vas deferens, normal epididymis Skin:    General: Skin is warm and dry.     Comments: Right fifth toe with no evidence of retained tick products, no erythema, no rash  Neurological:     Mental Status: He is alert.     Assessment/Plan: Please see individual problem list.  Problem List Items Addressed This Visit     Hyperlipidemia (Chronic)    Above goal.  He will continue Lipitor 80 mg once daily.  We will add Zetia to see if this gets his cholesterol under better control.  If no improvement with that we can consider PCSK9 inhibitor.       Relevant Medications   ezetimibe (ZETIA) 10 MG tablet   sildenafil (VIAGRA) 50 MG tablet   Other Relevant Orders   Lipid panel   Type 2 diabetes, uncontrolled, with neuropathy (HCC) (Chronic)    Uncontrolled.  Check A1c.  He will continue Trulicity 4.5 mg once weekly, Jardiance 25 mg daily, metformin 1000 mg twice daily, Basaglar 26 units once daily.  He will start taking his Humalog consistently with his first and second  meals of the day.  He will continue his freestyle libre and he will check his sugars at least 3 times daily.       Relevant Orders   POCT HgB A1C (Completed)   Erectile dysfunction    The patient certainly has risk factors for this.  He is not interested in being sexually active in the future.  He could benefit from a trial of Viagra.  I advised that if he ever develop chest pain or shortness of breath during sexual activity he needs to be evaluated.  Discussed that if he ever had to call EMS or go to the emergency room within 48 hours after taking the Viagra he would need to let them know that he took this medication.  He did show me the pictures of 2 women that have contacted him from a dating site.  I encouraged the patient to be quite careful when using dating sites to make sure he is not getting scammed.       Relevant Medications   sildenafil (VIAGRA) 50 MG tablet   Tick bite of foot    No signs of retention of any tick products.  Discussed monitoring for rash or systemic illness symptoms.  Advised if he develops knows he needs to contact us immediately.       Return in about 2 months (around 08/29/2020) for labs, 3 months PCP.  This visit occurred during the SARS-CoV-2 public health emergency.  Safety protocols were in place, including screening questions prior to the visit, additional usage of staff PPE, and extensive cleaning of exam room while observing appropriate contact time as indicated for disinfecting solutions.    [name], MD [place]

## 2020-06-29 NOTE — Assessment & Plan Note (Addendum)
Uncontrolled.  Check A1c.  He will continue Trulicity 4.5 mg once weekly, Jardiance 25 mg daily, metformin 1000 mg twice daily, Basaglar 26 units once daily.  He will start taking his Humalog consistently with his first and second meals of the day.  He will continue his freestyle libre and he will check his sugars at least 3 times daily. He was counseled on hypoglycemia protocol.  If he starts to run low with consistently taking his second dose of Humalog he will let us know right away.

## 2020-06-29 NOTE — Assessment & Plan Note (Signed)
Above goal.  He will continue Lipitor 80 mg once daily.  We will add Zetia to see if this gets his cholesterol under better control.  If no improvement with that we can consider PCSK9 inhibitor.

## 2020-06-29 NOTE — Addendum Note (Signed)
Addended by: Leone Haven on: 06/29/2020 07:53 PM   Modules accepted: Orders

## 2020-06-29 NOTE — Patient Instructions (Signed)
Nice to see you. We are going to start you on Zetia for your cholesterol.  We will have labs repeated in about 2 months. You need to consistently use your Humalog 16 units with both meals to help control your sugar.  If you start to go low with this regimen please let us know right away.  You should always eat something if you are having blood sugars.

## 2020-06-29 NOTE — Assessment & Plan Note (Signed)
No signs of retention of any tick products.  Discussed monitoring for rash or systemic illness symptoms.  Advised if he develops knows he needs to contact us immediately.

## 2020-06-29 NOTE — Assessment & Plan Note (Signed)
The patient certainly has risk factors for this.  He is not interested in being sexually active in the future.  He could benefit from a trial of Viagra.  I advised that if he ever develop chest pain or shortness of breath during sexual activity he needs to be evaluated.  Discussed that if he ever had to call EMS or go to the emergency room within 48 hours after taking the Viagra he would need to let them know that he took this medication.  He did show me the pictures of 2 women that have contacted him from a dating site.  I encouraged the patient to be quite careful when using dating sites to make sure he is not getting scammed.

## 2020-07-07 ENCOUNTER — Telehealth: Payer: Medicare Other

## 2020-07-09 ENCOUNTER — Ambulatory Visit: Payer: Medicare Other | Admitting: Pharmacist

## 2020-07-09 DIAGNOSIS — E1140 Type 2 diabetes mellitus with diabetic neuropathy, unspecified: Secondary | ICD-10-CM

## 2020-07-09 DIAGNOSIS — I10 Essential (primary) hypertension: Secondary | ICD-10-CM

## 2020-07-09 DIAGNOSIS — E785 Hyperlipidemia, unspecified: Secondary | ICD-10-CM

## 2020-07-09 DIAGNOSIS — IMO0002 Reserved for concepts with insufficient information to code with codable children: Secondary | ICD-10-CM

## 2020-07-09 NOTE — Chronic Care Management (AMB) (Signed)
Care Management   Pharmacy Note  07/09/2020 Name: BERYLE ZEITZ MRN: [id number] DOB: Jun 09, 1945  Subjective: Shane Jones is a 75 y.o. year old male who is a primary care patient of Caryl Bis, Angela Adam, MD. The Care Management team was consulted for assistance with care management and care coordination needs.    Engaged with patient by telephone for  response to call about device access  in response to provider referral for pharmacy case management and/or care coordination services.   The patient was given information about Care Management services today including:  Care Management services includes personalized support from designated clinical staff supervised by the patient's primary care provider, including individualized plan of care and coordination with other care providers. 24/7 contact phone numbers for assistance for urgent and routine care needs. The patient may stop case management services at any time by phone call to the office staff.  Patient agreed to services and consent obtained.  Assessment:  Review of patient status, including review of consultants reports, laboratory and other test data, was performed as part of comprehensive evaluation and provision of chronic care management services.   SDOH (Social Determinants of Health) assessments and interventions performed:  none today  Objective:  Lab Results  Component Value Date   CREATININE 1.65 (H) 03/10/2020   CREATININE 1.81 (H) 09/01/2019   CREATININE 1.85 (H) 01/14/2019    Lab Results  Component Value Date   HGBA1C 8.6 (A) 06/29/2020       Component Value Date/Time   CHOL 146 03/10/2020 1526   TRIG (H) 03/10/2020 1526    402.0 Triglyceride is over 400; calculations on Lipids are invalid.   HDL 30.10 (L) 03/10/2020 1526   CHOLHDL 5 03/10/2020 1526   VLDL 77.6 (H) 09/01/2019 1545   LDLCALC 23 08/07/2016 1156   LDLDIRECT 99.0 05/13/2020 1213     Clinical ASCVD: No  The 10-year ASCVD risk score Mikey Bussing DC  Jr., et al., 2013) is: 61.9%   Values used to calculate the score:     Age: 33 years     Sex: Male     Is Non-Hispanic African American: No     Diabetic: Yes     Tobacco smoker: Yes     Systolic Blood Pressure: 264 mmHg     Is BP treated: Yes     HDL Cholesterol: 30.1 mg/dL     Total Cholesterol: 146 mg/dL      BP Readings from Last 3 Encounters:  06/29/20 140/80  05/13/20 120/80  03/10/20 140/80    Care Plan  Allergies  Allergen Reactions   Uloric [Febuxostat] Hives    hives    Medications Reviewed Today     Reviewed by Gordy Councilman, CMA (Certified Medical Assistant) on 06/29/20 at 1224  Med List Status: <None>   Medication Order Taking? Sig Documenting Provider Last Dose Status Informant  Accu-Chek FastClix Lancets MISC [id number] Yes USE UP TO FOUR TIMES DAILY AS DIRECTED Leone Haven, MD Taking Active   ACCU-CHEK GUIDE test strip [id number] Yes USE UP TO FOUR TIMES DAILY AS DIRECTED Leone Haven, MD Taking Active   allopurinol (ZYLOPRIM) 100 MG tablet [id number] Yes TAKE 1 TABLET DAILY Leone Haven, MD Taking Active   amLODipine (NORVASC) 10 MG tablet [id number] Yes TAKE 1 TABLET EVERY DAY Leone Haven, MD Taking Active   aspirin 325 MG tablet [id number] Yes Take 325 mg by mouth daily. [provider] Taking Active   atorvastatin (LIPITOR) 80 MG  tablet [id number] Yes TAKE 1 TABLET EVERY DAY Leone Haven, MD Taking Active   B Complex Vitamins (VITAMIN-B COMPLEX PO) [id number] Yes Take by mouth. [provider] Taking Active   BETA CAROTENE PO [id number] Yes Take by mouth. [provider] Taking Active   blood glucose meter kit and supplies [id number] Yes Dispense based on patient and insurance preference. Use up to four times daily as directed. (FOR ICD-10 E10.9, E11.9). Leone Haven, MD Taking Active   Calcium Carbonate-Vitamin D (CALCIUM + D PO) [id number] Yes Take by mouth. [provider] Taking Active    doxazosin (CARDURA) 4 MG tablet [id number] Yes  [provider] Taking Active   Dulaglutide (TRULICITY) 4.5 BC/4.8GQ SOPN [id number] Yes Inject 4.5 mg as directed once a week. Leone Haven, MD Taking Active            Med Note De Hollingshead   Tue Jun 15, 2020  4:31 PM)    empagliflozin (JARDIANCE) 25 MG TABS tablet [id number] Yes Take 25 mg by mouth daily. Leone Haven, MD Taking Active   escitalopram (LEXAPRO) 20 MG tablet [id number] Yes TAKE 1 TABLET (20 MG) BY MOUTH DAILY Leone Haven, MD Taking Active   furosemide (LASIX) 20 MG tablet [id number] Yes Take one tablet (20 mg total) by mouth daily. Leone Haven, MD Taking Active   gabapentin (NEURONTIN) 400 MG capsule [id number] Yes Take 1 capsule (400 mg total) by mouth 3 (three) times daily. Leone Haven, MD Taking Active   hydrALAZINE (APRESOLINE) 10 MG tablet [id number] Yes TAKE 1 TABLET(10 MG) BY MOUTH IN THE MORNING AND AT BEDTIME Leone Haven, MD Taking Active   hydrALAZINE (APRESOLINE) 10 MG tablet [id number] Yes TAKE 1 TABLET(10 MG) BY MOUTH IN THE MORNING AND AT BEDTIME Leone Haven, MD Taking Active   Insulin Glargine [facility] KWIKPEN) 100 UNIT/ML [id number] Yes Inject 26 Units into the skin daily. Leone Haven, MD Taking Active   insulin lispro (HUMALOG KWIKPEN) 100 UNIT/ML KwikPen [id number] Yes Inject 14 units with breakfast, 12 units with supper Leone Haven, MD Taking Active   Insulin Pen Needle (PEN NEEDLES) 32G X 4 MM MISC [id number] Yes Use to inject insulin up to 4 times daily Leone Haven, MD Taking Active   losartan (COZAAR) 100 MG tablet [id number] Yes TAKE 1 TABLET EVERY DAY Leone Haven, MD Taking Active   magnesium 30 MG tablet [id number] Yes Take 30 mg by mouth 2 (two) times daily. [provider] Taking Active   metFORMIN (GLUCOPHAGE XR) 500 MG 24 hr tablet [id number] Yes Take 2 tablets (1,000 mg total) by mouth in the morning and at bedtime.  Leone Haven, MD Taking Active   metoprolol succinate (TOPROL-XL) 100 MG 24 hr tablet [id number] Yes TAKE 1 TABLET EVERY DAY Leone Haven, MD Taking Active   Multiple Vitamin (MULTIVITAMIN) tablet [id number] Yes Take 1 tablet by mouth daily. [provider] Taking Active   pantoprazole (PROTONIX) 40 MG tablet [id number] Yes Take 1 tablet (40 mg total) by mouth daily. Leone Haven, MD Taking Active   potassium chloride (KLOR-CON) 10 MEQ tablet [id number] Yes TAKE 1 TABLET BY MOUTH DAILY WITH LASIX Leone Haven, MD Taking Active   Zinc Sulfate (ZINC 15 PO) [id number] Yes Take by mouth. [provider] Taking Active             Patient Active Problem List   Diagnosis Date Noted   Erectile  dysfunction 06/29/2020   Tick bite of foot 06/29/2020   Headache 05/13/2020   Overweight 05/30/2019   Benign hypertensive kidney disease with chronic kidney disease 01/22/2019   Secondary hyperparathyroidism of renal origin ([place]) 01/22/2019   Stage 3a chronic kidney disease ([place]) 01/22/2019   Lightheadedness 12/19/2018   Skin lesions 01/21/2018   Esophagitis    Proteinuria 10/10/2017   Onychomycosis 03/19/2017   Anxiety and depression 09/19/2016   Hypokalemia 09/19/2016   Loss of height 05/01/2016   Left shoulder pain 01/31/2016   Sleep apnea 11/22/2011   Chronic back pain 08/17/2011   Hyperlipidemia 08/17/2011   Gout 08/17/2011   Type 2 diabetes, uncontrolled, with neuropathy ([place]) 02/20/2011   Neuropathy ([place]) 02/17/2011   Hypertension 10/13/2010    Conditions to be addressed/monitored: HTN, HLD, and DMII  Care Plan : Medication Management  Updates made by De Hollingshead, RPH-CPP since 07/09/2020 12:00 AM     Problem: Diabetes, Hypertension, CKD, Depression      Long-Range Goal: Disease Progression Prevention   This Visit's Progress: On track  Recent Progress: On track  Priority: High  Note:   Current Barriers:  Unable to independently  afford treatment regimen Unable to independently monitor therapeutic efficacy Unable to achieve control of diabetes   Pharmacist Clinical Goal(s):  Over the next 90 days, patient will verbalize ability to afford treatment regimen. Over the next 90 days, patient will achieve control of diabetes as evidenced by improvement in A1c through collaboration with PharmD and provider.   Interventions: 1:1 collaboration with Leone Haven, MD regarding development and update of comprehensive plan of care as evidenced by provider attestation and co-signature Inter-disciplinary care team collaboration (see longitudinal plan of care) Comprehensive medication review performed; medication list updated in electronic medical record  Diabetes: Uncontrolled; current treatment: metformin XR 1000 mg BID, Jardiance 25 mg daily, Trulicity 4.5 mg; Basaglar 26 units, Humalog 16 units once daily - advised to take 14 units with breakfast and 12 units with supper Calls today to report that he had a sensor not work. Reviewed that he needs to call the manufacturer for a replacement when this happens. He verbalizes that he will do so today.   Hypertension: Controlled per clinic reaidng; current treatment: furosemide 20 mg QAM, losartan 100 mg QAM, metoprolol XL 100 mg QAM, amlodipine 10 mg daily QAM, doxazosin 4 mg QPM, hydralazine 10 mg BID Previously recommended to continue current regimen at this time.  Hyperlipidemia: Controlled per last lipid panel; current treatment: atorvastatin 40 mg daily Antiplatelet regimen: aspirin 325 mg daily (hx CVA per chart review) Previously recommended to continue current regimen at this time.   Depression: Controlled per patient report; current treatment: escitalopram 20 mg daily Previously recommended to continue current regimen at this time.   Patient Goals/Self-Care Activities Over the next 90 days, patient will:  - take medications as prescribed check glucose three  times daily using CGM, document, and provide at future appointments check blood pressure periodically, document, and provide at future appointments collaborate with provider on medication access solutions  Follow Up Plan:  Telephone follow up appointment with care management team member scheduled for: ~ 4 weeks as previously scheduled      Medication Assistance:  Basaglar, Trulicity, Humalog, Jardiance obtained through [place] medication assistance program.  Enrollment ends 01/01/21  Follow Up:  Patient agrees to Care Plan and Follow-up.  Plan: Telephone follow up appointment with care management team member scheduled for:  ~ 4 weeks as previously  scheduled  Catie Darnelle Maffucci, PharmD, [place], [place] Clinical Pharmacist [employer] at [place]

## 2020-07-09 NOTE — Patient Instructions (Signed)
Visit Information  PATIENT GOALS:  Goals Addressed               This Visit's Progress     Patient Stated     Medication Monitoring (pt-stated)        Patient Goals/Self-Care Activities Over the next 90 days, patient will:  - take medications as prescribed check glucose three times daily using CGM, document, and provide at future appointments check blood pressure periodically, document, and provide at future appointments collaborate with provider on medication access solutions           Patient verbalizes understanding of instructions provided today and agrees to view in [place].   Plan: Telephone follow up appointment with care management team member scheduled for:  ~ 4 weeks as previously scheduled  Catie Darnelle Maffucci, PharmD, [place], [place] Clinical Pharmacist [employer] at [employer] [phone]

## 2020-07-11 DIAGNOSIS — Z20822 Contact with and (suspected) exposure to covid-19: Secondary | ICD-10-CM | POA: Diagnosis not present

## 2020-07-12 ENCOUNTER — Other Ambulatory Visit: Payer: Self-pay

## 2020-07-12 DIAGNOSIS — N529 Male erectile dysfunction, unspecified: Secondary | ICD-10-CM

## 2020-07-12 MED ORDER — SILDENAFIL CITRATE 50 MG PO TABS: 50.0000 mg | ORAL_TABLET | Freq: Every day | ORAL | 0 refills | Status: DC | PRN

## 2020-07-29 ENCOUNTER — Telehealth: Payer: Medicare Other

## 2020-07-29 ENCOUNTER — Telehealth: Payer: Self-pay | Admitting: Pharmacist

## 2020-07-29 NOTE — Telephone Encounter (Signed)
  Chronic Care Management   Note  07/29/2020 Name: Shane Jones MRN: [id number] DOB: 07-14-1945   Attempted to contact patient for scheduled appointment for medication management support. Left HIPAA compliant message for patient to return my call at their convenience.    Plan: - If I do not hear back from the patient by end of business today, will collaborate with Care Guide to outreach to schedule follow up with me  Catie Darnelle Maffucci, PharmD, [place], [place] Pharmacist [employer] at [employer] [phone]

## 2020-08-06 ENCOUNTER — Telehealth: Payer: Self-pay | Admitting: *Deleted

## 2020-08-06 NOTE — Chronic Care Management (AMB) (Signed)
  Care Management   Note  08/06/2020 Name: SHAFFER CARRY MRN: [id number] DOB: 1945-06-06  DAVARIOUS BRAZ is a 75 y.o. year old male who is a primary care patient of Leone Haven, MD and is actively engaged with the care management team. I reached out to Irean Hong by phone today to assist with re-scheduling a follow up visit with the Pharmacist  Follow up plan: Telephone appointment with care management team member scheduled for:8/15 '@1600'$   [facility]  Direct Dial: [phone]

## 2020-08-06 NOTE — Chronic Care Management (AMB) (Signed)
  Care Management   Note  08/06/2020 Name: Shane Jones MRN: [id number] DOB: [birth year]  DOCTOR COBAS is a 75 y.o. year old male who is a primary care patient of Leone Haven, MD and is actively engaged with the care management team. I reached out to Irean Hong by phone today to assist with re-scheduling a follow up visit with the Pharmacist  Follow up plan: Unsuccessful telephone outreach attempt made. A HIPAA compliant phone message was left for the patient providing contact information and requesting a return call.  The care management team will reach out to the patient again over the next 7 days.  If patient returns call to provider office, please advise to call [place] at [facility]  Direct Dial: [phone]

## 2020-08-16 ENCOUNTER — Ambulatory Visit (INDEPENDENT_AMBULATORY_CARE_PROVIDER_SITE_OTHER): Payer: Medicare Other | Admitting: Pharmacist

## 2020-08-16 DIAGNOSIS — E785 Hyperlipidemia, unspecified: Secondary | ICD-10-CM

## 2020-08-16 DIAGNOSIS — I10 Essential (primary) hypertension: Secondary | ICD-10-CM

## 2020-08-16 DIAGNOSIS — E1140 Type 2 diabetes mellitus with diabetic neuropathy, unspecified: Secondary | ICD-10-CM

## 2020-08-16 DIAGNOSIS — IMO0002 Reserved for concepts with insufficient information to code with codable children: Secondary | ICD-10-CM

## 2020-08-16 MED ORDER — BASAGLAR KWIKPEN 100 UNIT/ML ~~LOC~~ SOPN: 24.0000 [IU] | PEN_INJECTOR | Freq: Every day | SUBCUTANEOUS | 0 refills | Status: DC

## 2020-08-16 MED ORDER — INSULIN LISPRO (1 UNIT DIAL) 100 UNIT/ML (KWIKPEN): 16.0000 [IU] | PEN_INJECTOR | Freq: Three times a day (TID) | SUBCUTANEOUS | 0 refills | Status: DC

## 2020-08-16 NOTE — Chronic Care Management (AMB) (Signed)
Chronic Care Management Pharmacy Note  08/16/2020 Name:  Shane Jones MRN:  [id number] DOB:  09-21-1945  Subjective: Shane Jones is an 75 y.o. year old male who is a primary patient of Caryl Bis, Angela Adam, MD.  The CCM team was consulted for assistance with disease management and care coordination needs.    Engaged with patient by telephone for follow up visit in response to provider referral for pharmacy case management and/or care coordination services.   Consent to Services:  The patient was given information about Chronic Care Management services, agreed to services, and gave verbal consent prior to initiation of services.  Please see initial visit note for detailed documentation.   Patient Care Team: Leone Haven, MD as PCP - General (Family Medicine) De Hollingshead, RPH-CPP as Pharmacist (Pharmacist)   Objective:  Lab Results  Component Value Date   CREATININE 1.65 (H) 03/10/2020   CREATININE 1.81 (H) 09/01/2019   CREATININE 1.85 (H) 01/14/2019    Lab Results  Component Value Date   HGBA1C 8.6 (A) 06/29/2020   Last diabetic Eye exam:  Lab Results  Component Value Date/Time   HMDIABEYEEXA No Retinopathy 04/16/2017 12:00 AM    Last diabetic Foot exam:  Lab Results  Component Value Date/Time   HMDIABFOOTEX Normal 07/27/2015 12:00 AM        Component Value Date/Time   CHOL 146 03/10/2020 1526   TRIG (H) 03/10/2020 1526    402.0 Triglyceride is over 400; calculations on Lipids are invalid.   HDL 30.10 (L) 03/10/2020 1526   CHOLHDL 5 03/10/2020 1526   VLDL 77.6 (H) 09/01/2019 1545   LDLCALC 23 08/07/2016 1156   LDLDIRECT 99.0 05/13/2020 1213    Hepatic Function Latest Ref Rng & Units 05/13/2020 09/01/2019 12/26/2018  Total Protein 6.0 - 8.3 g/dL 6.9 6.9 6.5  Albumin 3.5 - 5.2 g/dL 4.1 4.3 4.2  AST 0 - 37 U/L _0 ALT 0 - 53 U/L _1 Alk Phosphatase 39 - 117 U/L 78 75 68  Total Bilirubin 0.2 - 1.2 mg/dL 0.9 1.0 0.9  Bilirubin, Direct  0.0 - 0.3 mg/dL 0.1 - -    Lab Results  Component Value Date/Time   TSH 1.11 12/26/2018 09:30 AM   TSH 0.84 02/17/2011 08:30 AM    CBC Latest Ref Rng & Units 12/26/2018  WBC 4.0 - 10.5 K/uL 6.2  Hemoglobin 13.0 - 17.0 g/dL 16.2  Hematocrit 39.0 - 52.0 % 46.3  Platelets 150.0 - 400.0 K/uL 188.0    No results found for: VD25OH  Clinical ASCVD: No  The 10-year ASCVD risk score Mikey Bussing DC Jr., et al., 2013) is: 61.9%   Values used to calculate the score:     Age: 89 years     Sex: Male     Is Non-Hispanic African American: No     Diabetic: Yes     Tobacco smoker: Yes     Systolic Blood Pressure: 716 mmHg     Is BP treated: Yes     HDL Cholesterol: 30.1 mg/dL     Total Cholesterol: 146 mg/dL      Social History   Tobacco Use  Smoking Status Former   Types: Cigarettes   Quit date: 11/20/1978   Years since quitting: 41.7  Smokeless Tobacco Never   BP Readings from Last 3 Encounters:  06/29/20 140/80  05/13/20 120/80  03/10/20 140/80   Pulse Readings from Last 3 Encounters:  06/29/20 87  05/13/20 73  03/10/20 86   Wt Readings from Last 3 Encounters:  06/29/20 172 lb 9.6 oz (78.3 kg)  05/13/20 168 lb (76.2 kg)  03/10/20 187 lb 12.8 oz (85.2 kg)    Assessment: Review of patient past medical history, allergies, medications, health status, including review of consultants reports, laboratory and other test data, was performed as part of comprehensive evaluation and provision of chronic care management services.   SDOH:  (Social Determinants of Health) assessments and interventions performed:  SDOH Interventions    Flowsheet Row Most Recent Value  SDOH Interventions   Financial Strain Interventions Other (Comment)  [manufacturer assistance]       CCM Care Plan  Allergies  Allergen Reactions   Uloric [Febuxostat] Hives    hives    Medications Reviewed Today     Reviewed by De Hollingshead, RPH-CPP (Pharmacist) on 08/16/20 at [place] List Status:  <None>   Medication Order Taking? Sig Documenting Provider Last Dose Status Informant  Accu-Chek FastClix Lancets MISC [id number]  USE UP TO FOUR TIMES DAILY AS DIRECTED Leone Haven, MD  Active   ACCU-CHEK GUIDE test strip [id number]  USE UP TO FOUR TIMES DAILY AS DIRECTED Leone Haven, MD  Active   allopurinol (ZYLOPRIM) 100 MG tablet [id number]  TAKE 1 TABLET DAILY Leone Haven, MD  Active   amLODipine (NORVASC) 10 MG tablet [id number]  TAKE 1 TABLET EVERY DAY Leone Haven, MD  Active   aspirin 325 MG tablet [id number]  Take 325 mg by mouth daily. [provider]  Active   atorvastatin (LIPITOR) 80 MG tablet [id number]  TAKE 1 TABLET EVERY DAY Leone Haven, MD  Active   B Complex Vitamins (VITAMIN-B COMPLEX PO) [id number]  Take by mouth. [provider]  Active   BETA CAROTENE PO [id number]  Take by mouth. [provider]  Active   blood glucose meter kit and supplies [id number]  Dispense based on patient and insurance preference. Use up to four times daily as directed. (FOR ICD-10 E10.9, E11.9). Leone Haven, MD  Active   Calcium Carbonate-Vitamin D (CALCIUM + D PO) [id number]  Take by mouth. [provider]  Active   doxazosin (CARDURA) 4 MG tablet [id number]   [provider]  Active   Dulaglutide (TRULICITY) 4.5 JI/9.6VE SOPN [id number] Yes Inject 4.5 mg as directed once a week. Leone Haven, MD Taking Active            Med Note De Hollingshead   Tue Jun 15, 2020  4:31 PM)    empagliflozin (JARDIANCE) 25 MG TABS tablet [id number] Yes Take 25 mg by mouth daily. Leone Haven, MD Taking Active   escitalopram (LEXAPRO) 20 MG tablet [id number]  TAKE 1 TABLET (20 MG) BY MOUTH DAILY Leone Haven, MD  Active   ezetimibe (ZETIA) 10 MG tablet [id number]  Take 1 tablet (10 mg total) by mouth daily. Leone Haven, MD  Active   furosemide (LASIX) 20 MG tablet [id number]  Take one tablet (20 mg total) by mouth  daily. Leone Haven, MD  Active   gabapentin (NEURONTIN) 400 MG capsule [id number]  Take 1 capsule (400 mg total) by mouth 3 (three) times daily. Leone Haven, MD  Active   hydrALAZINE (APRESOLINE) 10 MG tablet [id number]  TAKE 1 TABLET(10 MG) BY MOUTH IN THE MORNING AND AT BEDTIME Leone Haven, MD  Active   hydrALAZINE (APRESOLINE) 10 MG tablet [id number]  TAKE 1 TABLET(10 MG) BY MOUTH IN THE MORNING AND AT BEDTIME Leone Haven, MD  Active   Insulin Glargine [facility]) 100 UNIT/ML [id number] Yes Inject 26 Units into the skin daily. Leone Haven, MD Taking Active   insulin lispro [facility]) 100 UNIT/ML KwikPen [id number] Yes Inject 14 units with breakfast, 12 units with supper Leone Haven, MD Taking Active            Med Note Mayo Ao Aug 16, 2020  4:05 PM) 16 units  Insulin Pen Needle (PEN NEEDLES) 32G X 4 MM MISC [id number]  Use to inject insulin up to 4 times daily Leone Haven, MD  Active   losartan (COZAAR) 100 MG tablet [id number]  TAKE 1 TABLET EVERY DAY Leone Haven, MD  Active   magnesium 30 MG tablet [id number]  Take 30 mg by mouth 2 (two) times daily. [provider]  Active   metFORMIN (GLUCOPHAGE XR) 500 MG 24 hr tablet [id number] Yes Take 2 tablets (1,000 mg total) by mouth in the morning and at bedtime. Leone Haven, MD Taking Active   metoprolol succinate (TOPROL-XL) 100 MG 24 hr tablet [id number]  TAKE 1 TABLET EVERY DAY Leone Haven, MD  Active   Multiple Vitamin (MULTIVITAMIN) tablet [id number]  Take 1 tablet by mouth daily. [provider]  Active   pantoprazole (PROTONIX) 40 MG tablet [id number]  Take 1 tablet (40 mg total) by mouth daily. Leone Haven, MD  Active   potassium chloride (KLOR-CON) 10 MEQ tablet [id number]  TAKE 1 TABLET BY MOUTH DAILY WITH LASIX Leone Haven, MD  Active   sildenafil (VIAGRA) 50 MG tablet [id number]  Take 1 tablet (50 mg total) by mouth  daily as needed for erectile dysfunction. Leone Haven, MD  Active   Zinc Sulfate (ZINC 15 PO) [id number]  Take by mouth. [provider]  Active             Patient Active Problem List   Diagnosis Date Noted   Erectile dysfunction 06/29/2020   Tick bite of foot 06/29/2020   Headache 05/13/2020   Overweight 05/30/2019   Benign hypertensive kidney disease with chronic kidney disease 01/22/2019   Secondary hyperparathyroidism of renal origin ([place]) 01/22/2019   Stage 3a chronic kidney disease ([place]) 01/22/2019   Lightheadedness 12/19/2018   Skin lesions 01/21/2018   Esophagitis    Proteinuria 10/10/2017   Onychomycosis 03/19/2017   Anxiety and depression 09/19/2016   Hypokalemia 09/19/2016   Loss of height 05/01/2016   Left shoulder pain 01/31/2016   Sleep apnea 11/22/2011   Chronic back pain 08/17/2011   Hyperlipidemia 08/17/2011   Gout 08/17/2011   Type 2 diabetes, uncontrolled, with neuropathy ([place]) 02/20/2011   Neuropathy ([place]) 02/17/2011   Hypertension 10/13/2010    Immunization History  Administered Date(s) Administered   Fluad Quad(high Dose 65+) 09/25/2019   Influenza Split 10/13/2010, 11/22/2011   Influenza, High Dose Seasonal PF 11/01/2015, 10/30/2016, 10/10/2017   Influenza,inj,Quad PF,6+ Mos 09/27/2012, 10/20/2013, 09/28/2014   Moderna Sars-Covid-2 Vaccination 03/20/2019, 04/17/2019   Pneumococcal Conjugate-13 04/14/2013   Pneumococcal Polysaccharide-23 08/17/2011   Tdap 08/16/2009    Conditions to be addressed/monitored: HTN, HLD, and DMII  Care Plan : Medication Management  Updates made by De Hollingshead, RPH-CPP since 08/16/2020 12:00 AM     Problem: Diabetes, Hypertension, CKD, Depression      Long-Range Goal: Disease Progression Prevention   This Visit's Progress: On track  Recent Progress: On track  Priority: High  Note:   Current Barriers:  Unable to independently afford treatment regimen Unable to independently monitor  therapeutic efficacy Unable to achieve control of diabetes   Pharmacist Clinical Goal(s):  Over the next 90 days, patient will verbalize ability to afford treatment regimen. Over the next 90 days, patient will achieve control of diabetes as evidenced by improvement in A1c through collaboration with PharmD and provider.   Interventions: 1:1 collaboration with Leone Haven, MD regarding development and update of comprehensive plan of care as evidenced by provider attestation and co-signature Inter-disciplinary care team collaboration (see longitudinal plan of care) Comprehensive medication review performed; medication list updated in electronic medical record  Diabetes: Uncontrolled; current treatment: metformin XR 1000 mg BID, Jardiance 25 mg daily, Trulicity 4.5 mg; Basaglar 26 units, Humalog 16 units once daily - advised to take 14 units with breakfast and 12 units with supper Current meal patterns: reports that he has been eating mostly fruit lately, as he hasn't wanted large or hot meals in the summer Current glucose readings:  Date of Download: 7/19-8/15/22 % Time CGM is active: 37% Average Glucose: 217 mg/dL Glucose Management Indicator: 8.5  Glucose Variability: 36.4 (goal <36%) Time in Goal:  - Time in range 70-180: 26% - Time above range: 68% - Time below range: 7% Observed patterns: significant post prandial elevations. Some fasting lows.  Reduce Basaglar to 24 units daily. Extensive dietary education. Discussed appropriate portion sizes of carbohydrates and incorporation of proteins with meals. He verbalized understanding.  Consider switch from Trulicity to [facility] as soon as available on [employer] patient assistance   Hypertension: Controlled per clinic reaidng; current treatment: furosemide 20 mg QAM, losartan 100 mg QAM, metoprolol XL 100 mg QAM, amlodipine 10 mg daily QAM, doxazosin 4 mg QPM, hydralazine 10 mg BID Previously recommended to continue current regimen at  this time.  Hyperlipidemia: Controlled per last lipid panel; current treatment: atorvastatin 40 mg daily Antiplatelet regimen: aspirin 325 mg daily (hx CVA per chart review) Previously recommended to continue current regimen at this time.   Depression: Controlled per patient report; current treatment: escitalopram 20 mg daily Previously recommended to continue current regimen at this time.   Patient Goals/Self-Care Activities Over the next 90 days, patient will:  - take medications as prescribed check glucose three times daily using CGM, document, and provide at future appointments check blood pressure periodically, document, and provide at future appointments collaborate with provider on medication access solutions  Follow Up Plan:  Telephone follow up appointment with care management team member scheduled for: ~ 4 weeks      Medication Assistance:  Basaglar, Trulicity, Humalog, Jardiance obtained through [place] medication assistance program.  Enrollment ends 01/01/21  Patient's preferred pharmacy is:  [id number], [place] [address] Phone: [phone] Fax: [phone]  [facility] - [place], [address] [place] [address] Phone: [phone] Fax: [phone]   Follow Up:  Patient agrees to Care Plan and Follow-up.  Plan: Telephone follow up appointment with care management team member scheduled for:  4 weeks  Catie Darnelle Maffucci, PharmD, [place], [place] Clinical Pharmacist [employer] at [employer] [phone]

## 2020-08-16 NOTE — Patient Instructions (Signed)
Shane Jones,   It was great talking with you today!  Reduce Basaglar to 24 units daily to reduce your risk of low blood sugars.   You may be eating too big of portion sizes of fruit. See this enclosed handout (flip to the last page) about serving sizes of fruits. Also incorporate some protein with each meal - low carb protein shakes (I really like the Premier Protein type), sliced deli meat, cheese snacks, yogurt - these are all light, not hot sources of protein that can help your sugars not spike as much after meals.   Take care,   Catie Darnelle Maffucci, PharmD  Visit Information  PATIENT GOALS:  Goals Addressed               This Visit's Progress     Patient Stated     Medication Monitoring (pt-stated)        Patient Goals/Self-Care Activities Over the next 90 days, patient will:  - take medications as prescribed check glucose three times daily using CGM, document, and provide at future appointments check blood pressure periodically, document, and provide at future appointments collaborate with provider on medication access solutions        The patient verbalized understanding of instructions, educational materials, and care plan provided today and agreed to receive a mailed copy of patient instructions, educational materials, and care plan.   Plan: Telephone follow up appointment with care management team member scheduled for:  4 weeks  Catie Darnelle Maffucci, PharmD, [place], [place] Clinical Pharmacist [employer] at [employer] [phone]

## 2020-08-17 DIAGNOSIS — E119 Type 2 diabetes mellitus without complications: Secondary | ICD-10-CM | POA: Diagnosis not present

## 2020-08-17 LAB — HM DIABETES EYE EXAM

## 2020-08-26 ENCOUNTER — Ambulatory Visit: Payer: Medicare Other | Admitting: Pharmacist

## 2020-08-26 DIAGNOSIS — E785 Hyperlipidemia, unspecified: Secondary | ICD-10-CM

## 2020-08-26 DIAGNOSIS — E1140 Type 2 diabetes mellitus with diabetic neuropathy, unspecified: Secondary | ICD-10-CM

## 2020-08-26 DIAGNOSIS — I10 Essential (primary) hypertension: Secondary | ICD-10-CM

## 2020-08-26 DIAGNOSIS — IMO0002 Reserved for concepts with insufficient information to code with codable children: Secondary | ICD-10-CM

## 2020-08-26 NOTE — Patient Instructions (Signed)
Visit Information  PATIENT GOALS:  Goals Addressed               This Visit's Progress     Patient Stated     Medication Monitoring (pt-stated)        Patient Goals/Self-Care Activities Over the next 90 days, patient will:  - take medications as prescribed check glucose three times daily using CGM, document, and provide at future appointments check blood pressure periodically, document, and provide at future appointments collaborate with provider on medication access solutions         Patient verbalizes understanding of instructions provided today and agrees to view in [place].   Plan: Telephone follow up appointment with care management team member scheduled for:  ~ 4 weeks as previously scheduled  Catie Darnelle Maffucci, PharmD, [place], [place] Clinical Pharmacist [employer] at [employer] [phone]
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
## 2020-08-27 ENCOUNTER — Ambulatory Visit: Payer: Medicare Other | Admitting: Pharmacist

## 2020-08-27 DIAGNOSIS — I10 Essential (primary) hypertension: Secondary | ICD-10-CM

## 2020-08-27 DIAGNOSIS — IMO0002 Reserved for concepts with insufficient information to code with codable children: Secondary | ICD-10-CM

## 2020-08-27 DIAGNOSIS — E785 Hyperlipidemia, unspecified: Secondary | ICD-10-CM

## 2020-08-27 NOTE — Patient Instructions (Signed)
Visit Information  PATIENT GOALS:  Goals Addressed               This Visit's Progress     Patient Stated     Medication Monitoring (pt-stated)        Patient Goals/Self-Care Activities Over the next 90 days, patient will:  - take medications as prescribed check glucose three times daily using CGM, document, and provide at future appointments check blood pressure periodically, document, and provide at future appointments collaborate with provider on medication access solutions        Patient verbalizes understanding of instructions provided today and agrees to view in [place].  Plan: Telephone follow up appointment with care management team member scheduled for:  4 weeks as previously scheduled  Catie Darnelle Maffucci, PharmD, [place], [place] Clinical Pharmacist [employer] at [employer] [phone]
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
## 2020-08-30 ENCOUNTER — Other Ambulatory Visit: Payer: Self-pay

## 2020-08-30 ENCOUNTER — Other Ambulatory Visit (INDEPENDENT_AMBULATORY_CARE_PROVIDER_SITE_OTHER): Payer: Medicare Other

## 2020-08-30 DIAGNOSIS — E785 Hyperlipidemia, unspecified: Secondary | ICD-10-CM | POA: Diagnosis not present

## 2020-08-30 LAB — HEPATIC FUNCTION PANEL
ALT: 10 U/L (ref 0–53)
AST: 16 U/L (ref 0–37)
Albumin: 4.2 g/dL (ref 3.5–5.2)
Alkaline Phosphatase: 83 U/L (ref 39–117)
Bilirubin, Direct: 0.1 mg/dL (ref 0.0–0.3)
Total Bilirubin: 0.7 mg/dL (ref 0.2–1.2)
Total Protein: 6.8 g/dL (ref 6.0–8.3)

## 2020-08-30 LAB — LIPID PANEL
Cholesterol: 170 mg/dL (ref 0–200)
HDL: 30 mg/dL — ABNORMAL LOW (ref >39.00)
Total CHOL/HDL Ratio: 6
Triglycerides: 401 mg/dL — ABNORMAL HIGH (ref 0.0–149.0)

## 2020-08-30 LAB — LDL CHOLESTEROL, DIRECT: Direct LDL: 107 mg/dL

## 2020-09-01 DIAGNOSIS — E1140 Type 2 diabetes mellitus with diabetic neuropathy, unspecified: Secondary | ICD-10-CM | POA: Diagnosis not present

## 2020-09-01 DIAGNOSIS — E785 Hyperlipidemia, unspecified: Secondary | ICD-10-CM | POA: Diagnosis not present

## 2020-09-01 DIAGNOSIS — I10 Essential (primary) hypertension: Secondary | ICD-10-CM

## 2020-09-01 DIAGNOSIS — E1165 Type 2 diabetes mellitus with hyperglycemia: Secondary | ICD-10-CM | POA: Diagnosis not present

## 2020-09-13 ENCOUNTER — Ambulatory Visit (INDEPENDENT_AMBULATORY_CARE_PROVIDER_SITE_OTHER): Payer: Medicare Other | Admitting: Pharmacist

## 2020-09-13 DIAGNOSIS — IMO0002 Reserved for concepts with insufficient information to code with codable children: Secondary | ICD-10-CM

## 2020-09-13 DIAGNOSIS — E785 Hyperlipidemia, unspecified: Secondary | ICD-10-CM

## 2020-09-13 DIAGNOSIS — E876 Hypokalemia: Secondary | ICD-10-CM

## 2020-09-13 DIAGNOSIS — I10 Essential (primary) hypertension: Secondary | ICD-10-CM

## 2020-09-13 DIAGNOSIS — M1A9XX1 Chronic gout, unspecified, with tophus (tophi): Secondary | ICD-10-CM

## 2020-09-13 DIAGNOSIS — E1140 Type 2 diabetes mellitus with diabetic neuropathy, unspecified: Secondary | ICD-10-CM

## 2020-09-13 MED ORDER — ALLOPURINOL 100 MG PO TABS: ORAL_TABLET | ORAL | 1 refills | Status: DC

## 2020-09-13 MED ORDER — FUROSEMIDE 20 MG PO TABS: ORAL_TABLET | ORAL | 1 refills | Status: DC

## 2020-09-13 MED ORDER — ATORVASTATIN CALCIUM 80 MG PO TABS: ORAL_TABLET | ORAL | 1 refills | Status: DC

## 2020-09-13 MED ORDER — POTASSIUM CHLORIDE CRYS ER 10 MEQ PO TBCR: EXTENDED_RELEASE_TABLET | ORAL | 1 refills | Status: DC

## 2020-09-13 MED ORDER — LOSARTAN POTASSIUM 100 MG PO TABS: ORAL_TABLET | ORAL | 1 refills | Status: DC

## 2020-09-13 MED ORDER — AMLODIPINE BESYLATE 10 MG PO TABS: ORAL_TABLET | ORAL | 1 refills | Status: DC

## 2020-09-13 MED ORDER — EZETIMIBE 10 MG PO TABS: 10.0000 mg | ORAL_TABLET | Freq: Every day | ORAL | 1 refills | Status: DC

## 2020-09-13 MED ORDER — METOPROLOL SUCCINATE ER 100 MG PO TB24: ORAL_TABLET | ORAL | 1 refills | Status: DC

## 2020-09-13 NOTE — Chronic Care Management (AMB) (Signed)
Chronic Care Management Pharmacy Note  09/13/2020 Name:  Shane Jones MRN:  [id number] DOB:  1945/12/24   Subjective: Shane Jones is an 75 y.o. year old male who is a primary patient of Caryl Bis, Angela Adam, MD.  The CCM team was consulted for assistance with disease management and care coordination needs.    Engaged with patient by telephone for follow up visit in response to provider referral for pharmacy case management and/or care coordination services.   Consent to Services:  The patient was given information about Chronic Care Management services, agreed to services, and gave verbal consent prior to initiation of services.  Please see initial visit note for detailed documentation.   Patient Care Team: Leone Haven, MD as PCP - General (Family Medicine) De Hollingshead, RPH-CPP as Pharmacist (Pharmacist)   Objective:  Lab Results  Component Value Date   CREATININE 1.65 (H) 03/10/2020   CREATININE 1.81 (H) 09/01/2019   CREATININE 1.85 (H) 01/14/2019    Lab Results  Component Value Date   HGBA1C 8.6 (A) 06/29/2020   Last diabetic Eye exam:  Lab Results  Component Value Date/Time   HMDIABEYEEXA No Retinopathy 04/16/2017 12:00 AM    Last diabetic Foot exam:  Lab Results  Component Value Date/Time   HMDIABFOOTEX Normal 07/27/2015 12:00 AM        Component Value Date/Time   CHOL 170 08/30/2020 0836   TRIG (H) 08/30/2020 0836    401.0 Triglyceride is over 400; calculations on Lipids are invalid.   HDL 30.00 (L) 08/30/2020 0836   CHOLHDL 6 08/30/2020 0836   VLDL 77.6 (H) 09/01/2019 1545   LDLCALC 23 08/07/2016 1156   LDLDIRECT 107.0 08/30/2020 0836    Hepatic Function Latest Ref Rng & Units 08/30/2020 05/13/2020 09/01/2019  Total Protein 6.0 - 8.3 g/dL 6.8 6.9 6.9  Albumin 3.5 - 5.2 g/dL 4.2 4.1 4.3  AST 0 - 37 U/L 16 13 13   ALT 0 - 53 U/L 10 12 11   Alk Phosphatase 39 - 117 U/L 83 78 75  Total Bilirubin 0.2 - 1.2 mg/dL 0.7 0.9 1.0  Bilirubin,  Direct 0.0 - 0.3 mg/dL 0.1 0.1 -    Lab Results  Component Value Date/Time   TSH 1.11 12/26/2018 09:30 AM   TSH 0.84 02/17/2011 08:30 AM    CBC Latest Ref Rng & Units 12/26/2018  WBC 4.0 - 10.5 K/uL 6.2  Hemoglobin 13.0 - 17.0 g/dL 16.2  Hematocrit 39.0 - 52.0 % 46.3  Platelets 150.0 - 400.0 K/uL 188.0    No results found for: VD25OH  Clinical ASCVD: Yes  The 10-year ASCVD risk score (Arnett DK, et al., 2019) is: 63.5%   Values used to calculate the score:     Age: 45 years     Sex: Male     Is Non-Hispanic African American: No     Diabetic: Yes     Tobacco smoker: Yes     Systolic Blood Pressure: 375 mmHg     Is BP treated: Yes     HDL Cholesterol: 30 mg/dL     Total Cholesterol: 170 mg/dL     Social History   Tobacco Use  Smoking Status Former   Types: Cigarettes   Quit date: 11/20/1978   Years since quitting: 41.8  Smokeless Tobacco Never   BP Readings from Last 3 Encounters:  06/29/20 140/80  05/13/20 120/80  03/10/20 140/80   Pulse Readings from Last 3 Encounters:  06/29/20 87  05/13/20  73  03/10/20 86   Wt Readings from Last 3 Encounters:  06/29/20 172 lb 9.6 oz (78.3 kg)  05/13/20 168 lb (76.2 kg)  03/10/20 187 lb 12.8 oz (85.2 kg)    Assessment: Review of patient past medical history, allergies, medications, health status, including review of consultants reports, laboratory and other test data, was performed as part of comprehensive evaluation and provision of chronic care management services.   SDOH:  (Social Determinants of Health) assessments and interventions performed:  SDOH Interventions    Flowsheet Row Most Recent Value  SDOH Interventions   Financial Strain Interventions Other (Comment)  [manufacturer assistance]       CCM Care Plan  Allergies  Allergen Reactions   Uloric [Febuxostat] Hives    hives    Medications Reviewed Today     Reviewed by De Hollingshead, RPH-CPP (Pharmacist) on 08/16/20 at [place]  Status: <None>   Medication Order Taking? Sig Documenting Provider Last Dose Status Informant  Accu-Chek FastClix Lancets MISC [id number]  USE UP TO FOUR TIMES DAILY AS DIRECTED Leone Haven, MD  Active   ACCU-CHEK GUIDE test strip [id number]  USE UP TO FOUR TIMES DAILY AS DIRECTED Leone Haven, MD  Active   allopurinol (ZYLOPRIM) 100 MG tablet [id number]  TAKE 1 TABLET DAILY Leone Haven, MD  Active   amLODipine (NORVASC) 10 MG tablet [id number]  TAKE 1 TABLET EVERY DAY Leone Haven, MD  Active   aspirin 325 MG tablet [id number]  Take 325 mg by mouth daily. [provider]  Active   atorvastatin (LIPITOR) 80 MG tablet [id number]  TAKE 1 TABLET EVERY DAY Leone Haven, MD  Active   B Complex Vitamins (VITAMIN-B COMPLEX PO) [id number]  Take by mouth. [provider]  Active   BETA CAROTENE PO [id number]  Take by mouth. [provider]  Active   blood glucose meter kit and supplies [id number]  Dispense based on patient and insurance preference. Use up to four times daily as directed. (FOR ICD-10 E10.9, E11.9). Leone Haven, MD  Active   Calcium Carbonate-Vitamin D (CALCIUM + D PO) [id number]  Take by mouth. [provider]  Active   doxazosin (CARDURA) 4 MG tablet [id number]   [provider]  Active   Dulaglutide (TRULICITY) 4.5 HG/9.9ME SOPN [id number] Yes Inject 4.5 mg as directed once a week. Leone Haven, MD Taking Active            Med Note De Hollingshead   Tue Jun 15, 2020  4:31 PM)    empagliflozin (JARDIANCE) 25 MG TABS tablet [id number] Yes Take 25 mg by mouth daily. Leone Haven, MD Taking Active   escitalopram (LEXAPRO) 20 MG tablet [id number]  TAKE 1 TABLET (20 MG) BY MOUTH DAILY Leone Haven, MD  Active   ezetimibe (ZETIA) 10 MG tablet [id number]  Take 1 tablet (10 mg total) by mouth daily. Leone Haven, MD  Active   furosemide (LASIX) 20 MG tablet [id number]  Take one tablet (20 mg total) by  mouth daily. Leone Haven, MD  Active   gabapentin (NEURONTIN) 400 MG capsule [id number]  Take 1 capsule (400 mg total) by mouth 3 (three) times daily. Leone Haven, MD  Active   hydrALAZINE (APRESOLINE) 10 MG tablet [id number]  TAKE 1 TABLET(10 MG) BY MOUTH IN THE MORNING AND AT BEDTIME Leone Haven, MD  Active   hydrALAZINE (APRESOLINE) 10 MG tablet [id number]  TAKE  1 TABLET(10 MG) BY MOUTH IN THE MORNING AND AT BEDTIME Leone Haven, MD  Active   Insulin Glargine [facility]) 100 UNIT/ML [id number] Yes Inject 26 Units into the skin daily. Leone Haven, MD Taking Active   insulin lispro [facility]) 100 UNIT/ML KwikPen [id number] Yes Inject 14 units with breakfast, 12 units with supper Leone Haven, MD Taking Active            Med Note Mayo Ao Aug 16, 2020  4:05 PM) 16 units  Insulin Pen Needle (PEN NEEDLES) 32G X 4 MM MISC [id number]  Use to inject insulin up to 4 times daily Leone Haven, MD  Active   losartan (COZAAR) 100 MG tablet [id number]  TAKE 1 TABLET EVERY DAY Leone Haven, MD  Active   magnesium 30 MG tablet [id number]  Take 30 mg by mouth 2 (two) times daily. [provider]  Active   metFORMIN (GLUCOPHAGE XR) 500 MG 24 hr tablet [id number] Yes Take 2 tablets (1,000 mg total) by mouth in the morning and at bedtime. Leone Haven, MD Taking Active   metoprolol succinate (TOPROL-XL) 100 MG 24 hr tablet [id number]  TAKE 1 TABLET EVERY DAY Leone Haven, MD  Active   Multiple Vitamin (MULTIVITAMIN) tablet [id number]  Take 1 tablet by mouth daily. [provider]  Active   pantoprazole (PROTONIX) 40 MG tablet [id number]  Take 1 tablet (40 mg total) by mouth daily. Leone Haven, MD  Active   potassium chloride (KLOR-CON) 10 MEQ tablet [id number]  TAKE 1 TABLET BY MOUTH DAILY WITH LASIX Leone Haven, MD  Active   sildenafil (VIAGRA) 50 MG tablet [id number]  Take 1 tablet (50 mg total) by  mouth daily as needed for erectile dysfunction. Leone Haven, MD  Active   Zinc Sulfate (ZINC 15 PO) [id number]  Take by mouth. [provider]  Active             Patient Active Problem List   Diagnosis Date Noted   Erectile dysfunction 06/29/2020   Tick bite of foot 06/29/2020   Headache 05/13/2020   Overweight 05/30/2019   Benign hypertensive kidney disease with chronic kidney disease 01/22/2019   Secondary hyperparathyroidism of renal origin ([place]) 01/22/2019   Stage 3a chronic kidney disease ([place]) 01/22/2019   Lightheadedness 12/19/2018   Skin lesions 01/21/2018   Esophagitis    Proteinuria 10/10/2017   Onychomycosis 03/19/2017   Anxiety and depression 09/19/2016   Hypokalemia 09/19/2016   Loss of height 05/01/2016   Left shoulder pain 01/31/2016   Sleep apnea 11/22/2011   Chronic back pain 08/17/2011   Hyperlipidemia 08/17/2011   Gout 08/17/2011   Type 2 diabetes, uncontrolled, with neuropathy ([place]) 02/20/2011   Neuropathy ([place]) 02/17/2011   Hypertension 10/13/2010    Immunization History  Administered Date(s) Administered   Fluad Quad(high Dose 65+) 09/25/2019   Influenza Split 10/13/2010, 11/22/2011   Influenza, High Dose Seasonal PF 11/01/2015, 10/30/2016, 10/10/2017   Influenza,inj,Quad PF,6+ Mos 09/27/2012, 10/20/2013, 09/28/2014   Moderna Sars-Covid-2 Vaccination 03/20/2019, 04/17/2019   Pneumococcal Conjugate-13 04/14/2013   Pneumococcal Polysaccharide-23 08/17/2011   Tdap 08/16/2009    Conditions to be addressed/monitored: HTN, HLD, and DMII  Care Plan : Medication Management  Updates made by De Hollingshead, RPH-CPP since 09/13/2020 12:00 AM     Problem: Diabetes, Hypertension, CKD, Depression      Long-Range Goal: Disease Progression Prevention   This Visit's Progress: On track  Recent Progress: On track  Priority: High  Note:   Current Barriers:  Unable to independently afford treatment regimen Unable to independently  monitor therapeutic efficacy Unable to achieve control of diabetes   Pharmacist Clinical Goal(s):  Over the next 90 days, patient will verbalize ability to afford treatment regimen. Over the next 90 days, patient will achieve control of diabetes as evidenced by improvement in A1c through collaboration with PharmD and provider.   Interventions: 1:1 collaboration with Leone Haven, MD regarding development and update of comprehensive plan of care as evidenced by provider attestation and co-signature Inter-disciplinary care team collaboration (see longitudinal plan of care) Comprehensive medication review performed; medication list updated in electronic medical record  Diabetes: Uncontrolled; current treatment: metformin XR 1000 mg BID, Jardiance 25 mg daily, Trulicity 4.5 mg; Basaglar 24 units, Humalog 14 units with breakfast and 12 units with supper Reports that his phone is not running the [place] app correctly. He has tried to troubleshoot on his own and has been unable to do so. Advised to delete Libre 2 app and re-download the most updated version. Scheduled a follow up face to face after upcoming PCP visit for more troubleshooting support  Consider switch from Trulicity to [facility] as soon as available on [employer] patient assistance  Hypertension: Controlled per clinic reading; current treatment: furosemide 20 mg QAM, losartan 100 mg QAM, metoprolol XL 100 mg QAM, amlodipine 10 mg daily QAM, doxazosin 4 mg QPM- taking QAM; hydralazine 10 mg BID - only taking QAM Reviewed pill bottles today. He has a "regular" box of bottles, but has "other" boxes. He did not have losartan or metoprolol in his "regular" box, but he plans to look in his "other" boxes tonight. Reviewed importance of throwing away expired medications. He requests that refills be sent to [facility].  Reviewed that hydralazine is BID and it would be better to take doxazosin QPM. Will provide list of how to take medications.   Recommended to continue current regimen at this time.  Hyperlipidemia: Uncontrolled per last lipid panel; current treatment: atorvastatin 80 mg, ezetimibe 10 mg - could not find ezetimibe today during our call Antiplatelet regimen: aspirin 325 mg daily (hx CVA per chart review) He will look for ezetimibe in his "other" boxes of pill bottles. If he cannot find it, he will request refill from [facility].  Recommended to continue current regimen at this time.   Depression: Controlled per patient report; current treatment: escitalopram 20 mg daily Requests refill on this be sent to [facility]. Will collaborate w/ PCP on this.  Previously recommended to continue current regimen at this time.   Peripheral Neuropathy: Improved per patient report; current regimen: gabapentin 400 mg TID - though he is taking PRN Patient reports improvement in neuropathy symptoms. Reports that he only feels that he needs to take gabapentin PRN.  If symptoms controlled, appropriate to continue PRN at this time. Monitor for sedation.   Gout: Controlled per patient report; current regimen: allopurinol 100 mg daily  Last uric acid WNL Recommended to continue current regimen at this time  GERD: Moderately well controlled per patient report; current regimen: pantoprazole 40 mg daily Reports GERD symptoms, but he is not necessarily taking before meals. Advised to take about 30 minutes prior to meals Recommended to continue current regimen at this time  Patient Goals/Self-Care Activities Over the next 90 days, patient will:  - take medications as prescribed check glucose three times daily using CGM, document, and provide at future appointments check blood pressure  periodically, document, and provide at future appointments collaborate with provider on medication access solutions  Follow Up Plan:  Face to Face appointment with care management team member scheduled for:  ~ 3 weeks      Medication Assistance:  Trulicity,  Basaglar, Humalog obtained through [employer] medication assistance program.  Enrollment ends 01/01/21 . Jardiance obtained through [facility] through 01/01/21.   Patient's preferred pharmacy is:  [id number] [address], [place] [address] Phone: [phone] Fax: [phone]  [facility] ([facility] - [place], [place] [address] Phone: [phone] Fax: [phone]  Follow Up:  Patient agrees to Care Plan and Follow-up.  Plan: Face to Face appointment with care management team member scheduled for: 3 weeks  Catie Darnelle Maffucci, PharmD, [place], [place] Clinical Pharmacist [employer] at [employer] [phone]

## 2020-09-13 NOTE — Patient Instructions (Addendum)
Shane Jones,   It was great talking to you!  Here is how I suggest you take your regular medications:   30 minutes before breakfast: - Pantoprazole 40 mg (acid reflux)  Morning: - Allopurinol 100 mg daily (gout prevention) - Amlodipine 10 mg daily (blood pressure) - Hydralazine 10 mg (blood pressure) - Losartan 100 mg (blood pressure) - Furosemide 20 mg (fluid) - Potassium 10 mEq (goes along with furosemide) - Aspirin 325 mg daily (stroke prevention) - Atorvastatin 80 mg daily (cholesterol) - Ezetimibe 10 mg daily (cholesterol) - Escitalopram 10 mg (depression) - Jardiance 25 mg (diabetes) - Metformin XR 1000 mg -two tablets (diabetes)  Evening: - Hydralazine 10 mg (blood pressure) - Doxazosin 4 mg (blood pressure) - Metformin XR 1000 mg -two tablets (diabetes)  Please call me if you need refills on anything. Take care!  Catie Darnelle Maffucci, PharmD [phone]  Visit Information  PATIENT GOALS:  Goals Addressed               This Visit's Progress     Patient Stated     Medication Monitoring (pt-stated)        Patient Goals/Self-Care Activities Over the next 90 days, patient will:  - take medications as prescribed check glucose three times daily using CGM, document, and provide at future appointments check blood pressure periodically, document, and provide at future appointments collaborate with provider on medication access solutions         Patient verbalizes understanding of instructions provided today and agrees to view in [place].   Plan: Face to Face appointment with care management team member scheduled for: 3 weeks   Catie Darnelle Maffucci, PharmD, [place], [place] Clinical Pharmacist [employer] at [employer] [phone]

## 2020-09-16 ENCOUNTER — Other Ambulatory Visit: Payer: Self-pay | Admitting: Family Medicine

## 2020-09-16 MED ORDER — ESCITALOPRAM OXALATE 20 MG PO TABS: ORAL_TABLET | ORAL | 1 refills | Status: DC

## 2020-09-24 ENCOUNTER — Telehealth: Payer: Medicare Other

## 2020-09-29 ENCOUNTER — Ambulatory Visit: Payer: Medicare Other | Admitting: Pharmacist

## 2020-09-29 ENCOUNTER — Other Ambulatory Visit: Payer: Self-pay

## 2020-09-29 ENCOUNTER — Ambulatory Visit (INDEPENDENT_AMBULATORY_CARE_PROVIDER_SITE_OTHER): Payer: Medicare Other | Admitting: Family Medicine

## 2020-09-29 VITALS — BP 118/80 | HR 82 | Temp 97.4°F | Ht 66.0 in | Wt 176.0 lb

## 2020-09-29 DIAGNOSIS — E785 Hyperlipidemia, unspecified: Secondary | ICD-10-CM

## 2020-09-29 DIAGNOSIS — B351 Tinea unguium: Secondary | ICD-10-CM | POA: Diagnosis not present

## 2020-09-29 DIAGNOSIS — I10 Essential (primary) hypertension: Secondary | ICD-10-CM

## 2020-09-29 DIAGNOSIS — E1165 Type 2 diabetes mellitus with hyperglycemia: Secondary | ICD-10-CM | POA: Diagnosis not present

## 2020-09-29 DIAGNOSIS — F32A Depression, unspecified: Secondary | ICD-10-CM

## 2020-09-29 DIAGNOSIS — E1140 Type 2 diabetes mellitus with diabetic neuropathy, unspecified: Secondary | ICD-10-CM

## 2020-09-29 DIAGNOSIS — IMO0002 Reserved for concepts with insufficient information to code with codable children: Secondary | ICD-10-CM

## 2020-09-29 MED ORDER — BASAGLAR KWIKPEN 100 UNIT/ML ~~LOC~~ SOPN: 26.0000 [IU] | PEN_INJECTOR | Freq: Every day | SUBCUTANEOUS | 0 refills | Status: DC

## 2020-09-29 MED ORDER — INSULIN LISPRO (1 UNIT DIAL) 100 UNIT/ML (KWIKPEN): 20.0000 [IU] | PEN_INJECTOR | Freq: Two times a day (BID) | SUBCUTANEOUS | 0 refills | Status: DC

## 2020-09-29 NOTE — Patient Instructions (Signed)
[place] - This is how you should take your medications:   30 minutes before breakfast: - Pantoprazole 40 mg (acid reflux)   Morning: - Allopurinol 100 mg daily (gout prevention) - Amlodipine 10 mg daily (blood pressure) - Hydralazine 10 mg (blood pressure) - Losartan 100 mg (blood pressure) - Furosemide 20 mg (fluid) - Potassium 10 mEq (goes along with furosemide) - Aspirin 325 mg daily (stroke prevention) - Atorvastatin 80 mg daily (cholesterol) - Ezetimibe 10 mg daily (cholesterol) - Escitalopram 10 mg (depression) - Jardiance 25 mg (diabetes) - Metformin XR 1000 mg -two tablets (diabetes)   Evening: - Hydralazine 10 mg (blood pressure) - Doxazosin 4 mg (blood pressure) - Metformin XR 1000 mg -two tablets (diabetes)  Inject BASAGLAR (white/green) 26 units once daily Inject Humalog (gray/red) 18-24 units 15-20 minutes before each meal Inject Trulicity 4.5 mg once weekly   Call me if you need any refills on anything. Call me if you have significant blood sugar elevations (in the 300s) or you have lows (<70)  Catie Darnelle Maffucci, PharmD Visit Information  PATIENT GOALS:  Goals Addressed               This Visit's Progress     Patient Stated     Medication Monitoring (pt-stated)        Patient Goals/Self-Care Activities Over the next 90 days, patient will:  - take medications as prescribed check glucose three times daily using CGM, document, and provide at future appointments check blood pressure periodically, document, and provide at future appointments collaborate with provider on medication access solutions          Patient verbalizes understanding of instructions provided today and agrees to view in [place].   Plan: Telephone follow up appointment with care management team member scheduled for:  6 weeks  Catie Darnelle Maffucci, PharmD, [place], [place] Clinical Pharmacist [employer] at [employer] [phone]

## 2020-09-29 NOTE — Progress Notes (Signed)
Shane Rumps, MD Phone: [phone]  Shane Jones is a 75 y.o. male who presents today for f/u.  DIABETES Disease Monitoring: Blood Sugar ranges-88-130s Polyuria/phagia/dipsia- notes polydipsia      Optho- UTD, requesting records Medications: Compliance- taking basaglar 26 u BID with meals, humalog 20-22 u BID with meals, metformin, jardiance, trulicity Hypoglycemic symptoms- no  HYPERLIPIDEMIA Symptoms Chest pain on exertion:  no   Medications: Compliance- taking lipitor, zetia Right upper quadrant pain- no  Muscle aches- no Lipid Panel     Component Value Date/Time   CHOL 170 08/30/2020 0836   TRIG (H) 08/30/2020 0836    401.0 Triglyceride is over 400; calculations on Lipids are invalid.   HDL 30.00 (L) 08/30/2020 0836   CHOLHDL 6 08/30/2020 0836   VLDL 77.6 (H) 09/01/2019 1545   LDLCALC 23 08/07/2016 1156   LDLDIRECT 107.0 08/30/2020 0836   Foot injury: Patient reports his dog stepped on his foot and he developed a crack between his left third and fourth toes.  He has been using a topical treatment that is used for cow utters that has helped the area heal up well.   Social History   Tobacco Use  Smoking Status Former   Types: Cigarettes   Quit date: 11/20/1978   Years since quitting: 41.8  Smokeless Tobacco Never    Current Outpatient Medications on File Prior to Visit  Medication Sig Dispense Refill   Accu-Chek FastClix Lancets MISC USE UP TO FOUR TIMES DAILY AS DIRECTED 102 each 2   ACCU-CHEK GUIDE test strip USE UP TO FOUR TIMES DAILY AS DIRECTED 100 each 2   allopurinol (ZYLOPRIM) 100 MG tablet TAKE 1 TABLET DAILY 90 tablet 1   amLODipine (NORVASC) 10 MG tablet TAKE 1 TABLET EVERY DAY 90 tablet 1   aspirin 325 MG tablet Take 325 mg by mouth daily.     atorvastatin (LIPITOR) 80 MG tablet TAKE 1 TABLET EVERY DAY 90 tablet 1   B Complex Vitamins (VITAMIN-B COMPLEX PO) Take by mouth.     BETA CAROTENE PO Take by mouth.     blood glucose meter kit and  supplies Dispense based on patient and insurance preference. Use up to four times daily as directed. (FOR ICD-10 E10.9, E11.9). 1 each 0   Calcium Carbonate-Vitamin D (CALCIUM + D PO) Take by mouth.     doxazosin (CARDURA) 4 MG tablet Take 4 mg by mouth daily.     Dulaglutide (TRULICITY) 4.5 GS/8.1JS SOPN Inject 4.5 mg as directed once a week. 6 mL 1   empagliflozin (JARDIANCE) 25 MG TABS tablet Take 25 mg by mouth daily. 90 tablet 3   escitalopram (LEXAPRO) 20 MG tablet TAKE 1 TABLET (20 MG) BY MOUTH DAILY 90 tablet 1   ezetimibe (ZETIA) 10 MG tablet Take 1 tablet (10 mg total) by mouth daily. 90 tablet 1   furosemide (LASIX) 20 MG tablet Take one tablet (20 mg total) by mouth daily. 90 tablet 1   gabapentin (NEURONTIN) 400 MG capsule Take 1 capsule (400 mg total) by mouth 3 (three) times daily. 270 capsule 0   hydrALAZINE (APRESOLINE) 10 MG tablet TAKE 1 TABLET(10 MG) BY MOUTH IN THE MORNING AND AT BEDTIME 60 tablet 2   Insulin Pen Needle (PEN NEEDLES) 32G X 4 MM MISC Use to inject insulin up to 4 times daily 400 each 3   losartan (COZAAR) 100 MG tablet TAKE 1 TABLET EVERY DAY 90 tablet 1   magnesium 30 MG tablet Take 30  mg by mouth 2 (two) times daily.     metFORMIN (GLUCOPHAGE XR) 500 MG 24 hr tablet Take 2 tablets (1,000 mg total) by mouth in the morning and at bedtime. 360 tablet 3   metoprolol succinate (TOPROL-XL) 100 MG 24 hr tablet TAKE 1 TABLET EVERY DAY 90 tablet 1   Multiple Vitamin (MULTIVITAMIN) tablet Take 1 tablet by mouth daily.     pantoprazole (PROTONIX) 40 MG tablet Take 1 tablet (40 mg total) by mouth daily. 90 tablet 0   potassium chloride (KLOR-CON) 10 MEQ tablet TAKE 1 TABLET BY MOUTH DAILY WITH LASIX 90 tablet 1   sildenafil (VIAGRA) 50 MG tablet Take 1 tablet (50 mg total) by mouth daily as needed for erectile dysfunction. 10 tablet 0   Zinc Sulfate (ZINC 15 PO) Take by mouth.     No current facility-administered medications on file prior to visit.     ROS see  history of present illness  Objective  Physical Exam Vitals:   09/29/20 1408  BP: 118/80  Pulse: 82  Temp: (!) 97.4 F (36.3 C)  SpO2: 97%    BP Readings from Last 3 Encounters:  09/29/20 118/80  06/29/20 140/80  05/13/20 120/80   Wt Readings from Last 3 Encounters:  09/29/20 176 lb (79.8 kg)  06/29/20 172 lb 9.6 oz (78.3 kg)  05/13/20 168 lb (76.2 kg)    Physical Exam Constitutional:      General: He is not in acute distress.    Appearance: He is not diaphoretic.  Cardiovascular:     Rate and Rhythm: Normal rate and regular rhythm.     Heart sounds: Normal heart sounds.  Pulmonary:     Effort: Pulmonary effort is normal.     Breath sounds: Normal breath sounds.  Musculoskeletal:     Right lower leg: No edema.     Left lower leg: No edema.  Skin:    General: Skin is warm and dry.  Neurological:     Mental Status: He is alert.   Diabetic Foot Exam - Simple   Simple Foot Form Diabetic Foot exam was performed with the following findings: Yes 09/29/2020  2:37 PM  Visual Inspection See comments: Yes Sensation Testing Intact to touch and monofilament testing bilaterally: Yes Pulse Check Posterior Tibialis and Dorsalis pulse intact bilaterally: Yes Comments Scab at the dorsal webspace of the left third and fourth toe, appears to be well-healing with no signs of infection, he has onychomycosis throughout bilateral toes      Assessment/Plan: Please see individual problem list.  Problem List Items Addressed This Visit     Hyperlipidemia (Chronic)    Continue Lipitor 80 mg once daily and Zetia 10 mg once daily.      Relevant Orders   Basic Metabolic Panel (BMET)   HgB A1c   Lipid panel   Type 2 diabetes, uncontrolled, with neuropathy (HCC) - Primary (Chronic)    A1c to be done today.  He will continue his Basaglar 26 units once daily, Humalog 20-22 units twice daily with meals, metformin 1000 mg twice daily, Jardiance 25 mg daily, and Trulicity 4.5 mg  weekly.      Relevant Medications   Insulin Glargine (BASAGLAR KWIKPEN) 100 UNIT/ML   insulin lispro (HUMALOG KWIKPEN) 100 UNIT/ML KwikPen   Other Relevant Orders   Basic Metabolic Panel (BMET)   HgB A1c   Urine Microalbumin w/creat. ratio   Onychomycosis    Refer to podiatry to discuss treatment options.  Relevant Orders   Ambulatory referral to Podiatry     Health Maintenance: Requesting ophthalmology records.  Return in about 3 months (around 12/29/2020) for Diabetes/hypertension.  This visit occurred during the SARS-CoV-2 public health emergency.  Safety protocols were in place, including screening questions prior to the visit, additional usage of staff PPE, and extensive cleaning of exam room while observing appropriate contact time as indicated for disinfecting solutions.    Shane Rumps, MD [place]

## 2020-09-29 NOTE — Assessment & Plan Note (Signed)
Refer to podiatry to discuss treatment options.

## 2020-09-29 NOTE — Patient Instructions (Signed)
Nice to see you. We will check lab work today and contact you with the results. 

## 2020-09-29 NOTE — Addendum Note (Signed)
Addended by: Leeanne Rio on: 09/29/2020 04:30 PM   Modules accepted: Orders

## 2020-09-29 NOTE — Addendum Note (Signed)
Addended by: Leeanne Rio on: 09/29/2020 05:07 PM   Modules accepted: Orders

## 2020-09-29 NOTE — Chronic Care Management (AMB) (Signed)
Chronic Care Management Pharmacy Note  09/29/2020 Name:  Shane Jones MRN:  [id number] DOB:  07-29-1945  Subjective: Shane Jones is an 75 y.o. year old male who is a primary patient of Caryl Bis, Angela Adam, MD.  The CCM team was consulted for assistance with disease management and care coordination needs.    Engaged with patient face to face for follow up visit in response to provider referral for pharmacy case management and/or care coordination services.   Consent to Services:  The patient was given information about Chronic Care Management services, agreed to services, and gave verbal consent prior to initiation of services.  Please see initial visit note for detailed documentation.   Patient Care Team: Leone Haven, MD as PCP - General (Family Medicine) De Hollingshead, RPH-CPP as Pharmacist (Pharmacist)   Objective:  Lab Results  Component Value Date   CREATININE 1.65 (H) 03/10/2020   CREATININE 1.81 (H) 09/01/2019   CREATININE 1.85 (H) 01/14/2019    Lab Results  Component Value Date   HGBA1C 8.6 (A) 06/29/2020   Last diabetic Eye exam:  Lab Results  Component Value Date/Time   HMDIABEYEEXA No Retinopathy 04/16/2017 12:00 AM    Last diabetic Foot exam:  Lab Results  Component Value Date/Time   HMDIABFOOTEX Normal 07/27/2015 12:00 AM        Component Value Date/Time   CHOL 170 08/30/2020 0836   TRIG (H) 08/30/2020 0836    401.0 Triglyceride is over 400; calculations on Lipids are invalid.   HDL 30.00 (L) 08/30/2020 0836   CHOLHDL 6 08/30/2020 0836   VLDL 77.6 (H) 09/01/2019 1545   LDLCALC 23 08/07/2016 1156   LDLDIRECT 107.0 08/30/2020 0836    Hepatic Function Latest Ref Rng & Units 08/30/2020 05/13/2020 09/01/2019  Total Protein 6.0 - 8.3 g/dL 6.8 6.9 6.9  Albumin 3.5 - 5.2 g/dL 4.2 4.1 4.3  AST 0 - 37 U/L 16 13 13   ALT 0 - 53 U/L 10 12 11   Alk Phosphatase 39 - 117 U/L 83 78 75  Total Bilirubin 0.2 - 1.2 mg/dL 0.7 0.9 1.0  Bilirubin, Direct  0.0 - 0.3 mg/dL 0.1 0.1 -    Lab Results  Component Value Date/Time   TSH 1.11 12/26/2018 09:30 AM   TSH 0.84 02/17/2011 08:30 AM    CBC Latest Ref Rng & Units 12/26/2018  WBC 4.0 - 10.5 K/uL 6.2  Hemoglobin 13.0 - 17.0 g/dL 16.2  Hematocrit 39.0 - 52.0 % 46.3  Platelets 150.0 - 400.0 K/uL 188.0    Social History   Tobacco Use  Smoking Status Former   Types: Cigarettes   Quit date: 11/20/1978   Years since quitting: 41.8  Smokeless Tobacco Never   BP Readings from Last 3 Encounters:  09/29/20 118/80  06/29/20 140/80  05/13/20 120/80   Pulse Readings from Last 3 Encounters:  09/29/20 82  06/29/20 87  05/13/20 73   Wt Readings from Last 3 Encounters:  09/29/20 176 lb (79.8 kg)  06/29/20 172 lb 9.6 oz (78.3 kg)  05/13/20 168 lb (76.2 kg)    Assessment: Review of patient past medical history, allergies, medications, health status, including review of consultants reports, laboratory and other test data, was performed as part of comprehensive evaluation and provision of chronic care management services.   SDOH:  (Social Determinants of Health) assessments and interventions performed:    CCM Care Plan  Allergies  Allergen Reactions   Uloric [Febuxostat] Hives    hives  Medications Reviewed Today     Reviewed by Gordy Councilman, CMA (Certified Medical Assistant) on 09/29/20 at 1414  Med List Status: <None>   Medication Order Taking? Sig Documenting Provider Last Dose Status Informant  Accu-Chek FastClix Lancets MISC [id number] Yes USE UP TO FOUR TIMES DAILY AS DIRECTED Leone Haven, MD Taking Active   ACCU-CHEK GUIDE test strip [id number] Yes USE UP TO FOUR TIMES DAILY AS DIRECTED Leone Haven, MD Taking Active   allopurinol (ZYLOPRIM) 100 MG tablet [id number] Yes TAKE 1 TABLET DAILY Leone Haven, MD Taking Active   amLODipine (NORVASC) 10 MG tablet [id number] Yes TAKE 1 TABLET EVERY DAY Leone Haven, MD Taking Active   aspirin 325 MG  tablet [id number] Yes Take 325 mg by mouth daily. [provider] Taking Active   atorvastatin (LIPITOR) 80 MG tablet [id number] Yes TAKE 1 TABLET EVERY DAY Leone Haven, MD Taking Active   B Complex Vitamins (VITAMIN-B COMPLEX PO) [id number] Yes Take by mouth. [provider] Taking Active   BETA CAROTENE PO [id number] Yes Take by mouth. [provider] Taking Active   blood glucose meter kit and supplies [id number] Yes Dispense based on patient and insurance preference. Use up to four times daily as directed. (FOR ICD-10 E10.9, E11.9). Leone Haven, MD Taking Active   Calcium Carbonate-Vitamin D (CALCIUM + D PO) [id number] Yes Take by mouth. [provider] Taking Active   doxazosin (CARDURA) 4 MG tablet [id number] Yes Take 4 mg by mouth daily. [provider] Taking Active   Dulaglutide (TRULICITY) 4.5 QA/8.3MH SOPN [id number] Yes Inject 4.5 mg as directed once a week. Leone Haven, MD Taking Active            Med Note De Hollingshead   Tue Jun 15, 2020  4:31 PM)    empagliflozin (JARDIANCE) 25 MG TABS tablet [id number] Yes Take 25 mg by mouth daily. Leone Haven, MD Taking Active   escitalopram (LEXAPRO) 20 MG tablet [id number] Yes TAKE 1 TABLET (20 MG) BY MOUTH DAILY Leone Haven, MD Taking Active   ezetimibe (ZETIA) 10 MG tablet [id number] Yes Take 1 tablet (10 mg total) by mouth daily. Leone Haven, MD Taking Active   furosemide (LASIX) 20 MG tablet [id number] Yes Take one tablet (20 mg total) by mouth daily. Leone Haven, MD Taking Active   gabapentin (NEURONTIN) 400 MG capsule [id number] Yes Take 1 capsule (400 mg total) by mouth 3 (three) times daily. Leone Haven, MD Taking Active            Med Note Mayo Ao Sep 13, 2020  4:26 PM) Taking PRN  hydrALAZINE (APRESOLINE) 10 MG tablet [id number] Yes TAKE 1 TABLET(10 MG) BY MOUTH IN THE MORNING AND AT BEDTIME Leone Haven, MD Taking  Active   Insulin Glargine [facility] KWIKPEN) 100 UNIT/ML [id number] Yes Inject 24 Units into the skin daily. Leone Haven, MD Taking Active   insulin lispro [facility]) 100 UNIT/ML KwikPen [id number] Yes Inject 16 Units into the skin 3 (three) times daily. Leone Haven, MD Taking Active   Insulin Pen Needle (PEN NEEDLES) 32G X 4 MM MISC [id number] Yes Use to inject insulin up to 4 times daily Leone Haven, MD Taking Active   losartan (COZAAR) 100 MG tablet [id number] Yes TAKE 1 TABLET EVERY DAY Leone Haven, MD Taking Active   magnesium 30 MG tablet [id number] Yes Take 30 mg by  mouth 2 (two) times daily. [provider] Taking Active   metFORMIN (GLUCOPHAGE XR) 500 MG 24 hr tablet [id number] Yes Take 2 tablets (1,000 mg total) by mouth in the morning and at bedtime. Leone Haven, MD Taking Active   metoprolol succinate (TOPROL-XL) 100 MG 24 hr tablet [id number] Yes TAKE 1 TABLET EVERY DAY Leone Haven, MD Taking Active   Multiple Vitamin (MULTIVITAMIN) tablet [id number] Yes Take 1 tablet by mouth daily. [provider] Taking Active   pantoprazole (PROTONIX) 40 MG tablet [id number] Yes Take 1 tablet (40 mg total) by mouth daily. Leone Haven, MD Taking Active   potassium chloride (KLOR-CON) 10 MEQ tablet [id number] Yes TAKE 1 TABLET BY MOUTH DAILY WITH LASIX Leone Haven, MD Taking Active   sildenafil (VIAGRA) 50 MG tablet [id number] Yes Take 1 tablet (50 mg total) by mouth daily as needed for erectile dysfunction. Leone Haven, MD Taking Active   Zinc Sulfate (ZINC 15 PO) [id number] Yes Take by mouth. [provider] Taking Active             Patient Active Problem List   Diagnosis Date Noted   Erectile dysfunction 06/29/2020   Tick bite of foot 06/29/2020   Headache 05/13/2020   Overweight 05/30/2019   Benign hypertensive kidney disease with chronic kidney disease 01/22/2019   Secondary hyperparathyroidism of  renal origin ([place]) 01/22/2019   Stage 3a chronic kidney disease ([place]) 01/22/2019   Lightheadedness 12/19/2018   Skin lesions 01/21/2018   Esophagitis    Proteinuria 10/10/2017   Onychomycosis 03/19/2017   Anxiety and depression 09/19/2016   Hypokalemia 09/19/2016   Loss of height 05/01/2016   Left shoulder pain 01/31/2016   Sleep apnea 11/22/2011   Chronic back pain 08/17/2011   Hyperlipidemia 08/17/2011   Gout 08/17/2011   Type 2 diabetes, uncontrolled, with neuropathy ([place]) 02/20/2011   Neuropathy ([place]) 02/17/2011   Hypertension 10/13/2010    Immunization History  Administered Date(s) Administered   Fluad Quad(high Dose 65+) 09/25/2019   Influenza Split 10/13/2010, 11/22/2011   Influenza, High Dose Seasonal PF 11/01/2015, 10/30/2016, 10/10/2017   Influenza,inj,Quad PF,6+ Mos 09/27/2012, 10/20/2013, 09/28/2014   Moderna Sars-Covid-2 Vaccination 03/20/2019, 04/17/2019   Pneumococcal Conjugate-13 04/14/2013   Pneumococcal Polysaccharide-23 08/17/2011   Tdap 08/16/2009    Conditions to be addressed/monitored: HTN and DMII  Care Plan : Medication Management  Updates made by De Hollingshead, RPH-CPP since 09/29/2020 12:00 AM     Problem: Diabetes, Hypertension, CKD, Depression      Long-Range Goal: Disease Progression Prevention   This Visit's Progress: On track  Recent Progress: On track  Priority: High  Note:   Current Barriers:  Unable to independently afford treatment regimen Unable to independently monitor therapeutic efficacy Unable to achieve control of diabetes   Pharmacist Clinical Goal(s):  Over the next 90 days, patient will verbalize ability to afford treatment regimen. Over the next 90 days, patient will achieve control of diabetes as evidenced by improvement in A1c through collaboration with PharmD and provider.   Interventions: 1:1 collaboration with Leone Haven, MD regarding development and update of comprehensive plan of care as  evidenced by provider attestation and co-signature Inter-disciplinary care team collaboration (see longitudinal plan of care) Comprehensive medication review performed; medication list updated in electronic medical record  Diabetes: Uncontrolled; current treatment: metformin XR 1000 mg BID, Jardiance 25 mg daily, Trulicity 4.5 mg; Basaglar 24-26 units once to twice daily per his report, Humalog 14 units with breakfast and  12 units with supper, though reports he is using 18-24 units with meals. He cannot verbalize a specific sliding scale that he uses and we have 1 day of glycemic data available at this time.  Using [place] 2 CGM. Appears he was able to restart CGM monitoring.  Extensive review of color of pens and appropriate dosing. Reviewed impact of glycemic volatility of adjusting basal insulin to being once or twice daily. Recommend that patient stick with Basaglar 26 units once daily and Humalog 18-24 units with meals. Continue metformin XR 1000 mg BID and Jardiance 25 mg daily. Will plan to create a more specific pre meal sliding scale moving forward once more CGM data is available.  Consider switch from Trulicity to [facility] as soon as available on [employer] patient assistance.  A1c today.   Hypertension: Controlled per clinic reading today; current treatment: furosemide 20 mg QAM, losartan 100 mg QAM, metoprolol XL 100 mg QAM, amlodipine 10 mg daily QAM, doxazosin 4 mg QPM- taking QAM; hydralazine 10 mg BID Recommended to continue current regimen at this time. Provided list of how to take medications.  Hyperlipidemia: Uncontrolled per last lipid panel; current treatment: atorvastatin 80 mg, ezetimibe 10 mg  Antiplatelet regimen: aspirin 325 mg daily (hx CVA per chart review) Recommended to continue current regimen at this time. Lipids pending.   Depression: Controlled per patient report; current treatment: escitalopram 20 mg daily Previously recommended to continue current regimen at this  time.   Peripheral Neuropathy: Improved per patient report; current regimen: gabapentin 400 mg TID - though he is taking PRN Previously recommended to continue current regimen at this time.   Gout: Controlled per patient report; current regimen: allopurinol 100 mg daily  Last uric acid WNL Previously recommended to continue current regimen at this time  GERD: Moderately well controlled per patient report; current regimen: pantoprazole 40 mg daily Reports GERD symptoms, but he is not necessarily taking before meals. Advised to take about 30 minutes prior to meals Previously recommended to continue current regimen at this time  Patient Goals/Self-Care Activities Over the next 90 days, patient will:  - take medications as prescribed check glucose three times daily using CGM, document, and provide at future appointments check blood pressure periodically, document, and provide at future appointments collaborate with provider on medication access solutions  Follow Up Plan:  Face to Face appointment with care management team member scheduled for:  ~ 6 weeks      Medication Assistance: Trulicity, Basaglar, Humalog obtained through [employer] medication assistance program.  Enrollment ends 01/01/21 . Jardiance obtained through [facility] through 01/01/21.   Patient's preferred pharmacy is:  [id number] [address], [address] [place] [address] Phone: [phone] Fax: [phone]  [facility] [address], [address] [place] [address] Phone: [phone] Fax: [phone]   Follow Up:  Patient agrees to Care Plan and Follow-up.  Plan: Telephone follow up appointment with care management team member scheduled for:  6 weeks  Catie Darnelle Maffucci, PharmD, [place], [place] Clinical Pharmacist [employer] at [employer] [phone]

## 2020-09-29 NOTE — Assessment & Plan Note (Signed)
A1c to be done today.  He will continue his Basaglar 26 units once daily, Humalog 20-22 units twice daily with meals, metformin 1000 mg twice daily, Jardiance 25 mg daily, and Trulicity 4.5 mg weekly.

## 2020-09-29 NOTE — Assessment & Plan Note (Signed)
Continue Lipitor 80 mg once daily and Zetia 10 mg once daily.

## 2020-09-30 ENCOUNTER — Telehealth: Payer: Self-pay | Admitting: Pharmacist

## 2020-09-30 ENCOUNTER — Ambulatory Visit: Payer: Medicare Other | Admitting: Pharmacist

## 2020-09-30 ENCOUNTER — Telehealth: Payer: Medicare Other

## 2020-09-30 ENCOUNTER — Other Ambulatory Visit: Payer: Self-pay | Admitting: Family Medicine

## 2020-09-30 DIAGNOSIS — I10 Essential (primary) hypertension: Secondary | ICD-10-CM

## 2020-09-30 DIAGNOSIS — E1140 Type 2 diabetes mellitus with diabetic neuropathy, unspecified: Secondary | ICD-10-CM

## 2020-09-30 DIAGNOSIS — E785 Hyperlipidemia, unspecified: Secondary | ICD-10-CM

## 2020-09-30 DIAGNOSIS — IMO0002 Reserved for concepts with insufficient information to code with codable children: Secondary | ICD-10-CM

## 2020-09-30 DIAGNOSIS — E876 Hypokalemia: Secondary | ICD-10-CM

## 2020-09-30 LAB — LIPID PANEL
Cholesterol: 107 mg/dL (ref 0–200)
HDL: 31.9 mg/dL — ABNORMAL LOW (ref >39.00)
NonHDL: 74.86
Total CHOL/HDL Ratio: 3
Triglycerides: 208 mg/dL — ABNORMAL HIGH (ref 0.0–149.0)
VLDL: 41.6 mg/dL — ABNORMAL HIGH (ref 0.0–40.0)

## 2020-09-30 LAB — BASIC METABOLIC PANEL
BUN: 20 mg/dL (ref 6–23)
CO2: 29 mEq/L (ref 19–32)
Calcium: 9.2 mg/dL (ref 8.4–10.5)
Chloride: 107 mEq/L (ref 96–112)
Creatinine, Ser: 2.01 mg/dL — ABNORMAL HIGH (ref 0.40–1.50)
GFR: 31.41 mL/min — ABNORMAL LOW (ref >60.00)
Glucose, Bld: 94 mg/dL (ref 70–99)
Potassium: 2.9 mEq/L — ABNORMAL LOW (ref 3.5–5.1)
Sodium: 145 mEq/L (ref 135–145)

## 2020-09-30 LAB — MICROALBUMIN / CREATININE URINE RATIO
Creatinine,U: 123.4 mg/dL
Microalb Creat Ratio: 16.2 mg/g (ref 0.0–30.0)
Microalb, Ur: 19.9 mg/dL — ABNORMAL HIGH (ref 0.0–1.9)

## 2020-09-30 LAB — HEMOGLOBIN A1C: Hgb A1c MFr Bld: 7.3 % — ABNORMAL HIGH (ref 4.6–6.5)

## 2020-09-30 LAB — LDL CHOLESTEROL, DIRECT: Direct LDL: 53 mg/dL

## 2020-09-30 MED ORDER — METFORMIN HCL ER 500 MG PO TB24: 500.0000 mg | ORAL_TABLET | Freq: Two times a day (BID) | ORAL | 3 refills | Status: DC

## 2020-09-30 MED ORDER — POTASSIUM CHLORIDE CRYS ER 20 MEQ PO TBCR: 40.0000 meq | EXTENDED_RELEASE_TABLET | Freq: Every day | ORAL | 0 refills | Status: DC

## 2020-09-30 NOTE — Telephone Encounter (Signed)
Attempted to call patient for lab results and medication management, went to voicemail twice but voicemail was full.   Contacted patient's emergency contact, his son [name], and asked that he ask patient to call our office to review lab results and make necessary medication adjustments

## 2020-09-30 NOTE — Patient Instructions (Signed)
Visit Information  PATIENT GOALS:  Goals Addressed               This Visit's Progress     Patient Stated     Medication Monitoring (pt-stated)        Patient Goals/Self-Care Activities Over the next 90 days, patient will:  - take medications as prescribed check glucose three times daily using CGM, document, and provide at future appointments check blood pressure periodically, document, and provide at future appointments collaborate with provider on medication access solutions        Patient verbalizes understanding of instructions provided today and agrees to view in [place].   Plan: Face to Face appointment with care management team member scheduled for: 6 weeks  Catie Darnelle Maffucci, PharmD, [place], [place] Clinical Pharmacist [employer] at [employer] [phone]

## 2020-09-30 NOTE — Chronic Care Management (AMB) (Signed)
Chronic Care Management Pharmacy Note  09/30/2020 Name:  Shane Jones MRN:  [id number] DOB:  Nov 29, 1945  Subjective: Shane Jones is an 75 y.o. year old male who is a primary patient of Caryl Bis, Angela Adam, MD.  The CCM team was consulted for assistance with disease management and care coordination needs.    Engaged with patient by telephone for  lab follow up  in response to provider referral for pharmacy case management and/or care coordination services.   Consent to Services:  The patient was given information about Chronic Care Management services, agreed to services, and gave verbal consent prior to initiation of services.  Please see initial visit note for detailed documentation.   Patient Care Team: Leone Haven, MD as PCP - General (Family Medicine) De Hollingshead, RPH-CPP as Pharmacist (Pharmacist)   Objective:  Lab Results  Component Value Date   CREATININE 2.01 (H) 09/29/2020   CREATININE 1.65 (H) 03/10/2020   CREATININE 1.81 (H) 09/01/2019    Lab Results  Component Value Date   HGBA1C 7.3 (H) 09/29/2020   Last diabetic Eye exam:  Lab Results  Component Value Date/Time   HMDIABEYEEXA No Retinopathy 04/16/2017 12:00 AM    Last diabetic Foot exam:  Lab Results  Component Value Date/Time   HMDIABFOOTEX Normal 07/27/2015 12:00 AM        Component Value Date/Time   CHOL 107 09/29/2020 1507   TRIG 208.0 (H) 09/29/2020 1507   HDL 31.90 (L) 09/29/2020 1507   CHOLHDL 3 09/29/2020 1507   VLDL 41.6 (H) 09/29/2020 1507   LDLCALC 23 08/07/2016 1156   LDLDIRECT 53.0 09/29/2020 1507    Hepatic Function Latest Ref Rng & Units 08/30/2020 05/13/2020 09/01/2019  Total Protein 6.0 - 8.3 g/dL 6.8 6.9 6.9  Albumin 3.5 - 5.2 g/dL 4.2 4.1 4.3  AST 0 - 37 U/L _0 ALT 0 - 53 U/L _1 Alk Phosphatase 39 - 117 U/L 83 78 75  Total Bilirubin 0.2 - 1.2 mg/dL 0.7 0.9 1.0  Bilirubin, Direct 0.0 - 0.3 mg/dL 0.1 0.1 -    Lab Results  Component Value  Date/Time   TSH 1.11 12/26/2018 09:30 AM   TSH 0.84 02/17/2011 08:30 AM    CBC Latest Ref Rng & Units 12/26/2018  WBC 4.0 - 10.5 K/uL 6.2  Hemoglobin 13.0 - 17.0 g/dL 16.2  Hematocrit 39.0 - 52.0 % 46.3  Platelets 150.0 - 400.0 K/uL 188.0    Social History   Tobacco Use  Smoking Status Former   Types: Cigarettes   Quit date: 11/20/1978   Years since quitting: 41.8  Smokeless Tobacco Never   BP Readings from Last 3 Encounters:  09/29/20 118/80  06/29/20 140/80  05/13/20 120/80   Pulse Readings from Last 3 Encounters:  09/29/20 82  06/29/20 87  05/13/20 73   Wt Readings from Last 3 Encounters:  09/29/20 176 lb (79.8 kg)  06/29/20 172 lb 9.6 oz (78.3 kg)  05/13/20 168 lb (76.2 kg)    Assessment: Review of patient past medical history, allergies, medications, health status, including review of consultants reports, laboratory and other test data, was performed as part of comprehensive evaluation and provision of chronic care management services.   SDOH:  (Social Determinants of Health) assessments and interventions performed:    CCM Care Plan  Allergies  Allergen Reactions   Uloric [Febuxostat] Hives    hives    Medications Reviewed Today     Reviewed  by Gordy Councilman, [place] (Certified Medical Assistant) on 09/29/20 at 1414  Med List Status: <None>   Medication Order Taking? Sig Documenting Provider Last Dose Status Informant  Accu-Chek FastClix Lancets MISC [id number] Yes USE UP TO FOUR TIMES DAILY AS DIRECTED Leone Haven, MD Taking Active   ACCU-CHEK GUIDE test strip [id number] Yes USE UP TO FOUR TIMES DAILY AS DIRECTED Leone Haven, MD Taking Active   allopurinol (ZYLOPRIM) 100 MG tablet [id number] Yes TAKE 1 TABLET DAILY Leone Haven, MD Taking Active   amLODipine (NORVASC) 10 MG tablet [id number] Yes TAKE 1 TABLET EVERY DAY Leone Haven, MD Taking Active   aspirin 325 MG tablet [id number] Yes Take 325 mg by mouth daily. [provider] Taking Active   atorvastatin (LIPITOR) 80 MG tablet [id number] Yes TAKE 1 TABLET EVERY DAY Leone Haven, MD Taking Active   B Complex Vitamins (VITAMIN-B COMPLEX PO) [id number] Yes Take by mouth. [provider] Taking Active   BETA CAROTENE PO [id number] Yes Take by mouth. [provider] Taking Active   blood glucose meter kit and supplies [id number] Yes Dispense based on patient and insurance preference. Use up to four times daily as directed. (FOR ICD-10 E10.9, E11.9). Leone Haven, MD Taking Active   Calcium Carbonate-Vitamin D (CALCIUM + D PO) [id number] Yes Take by mouth. [provider] Taking Active   doxazosin (CARDURA) 4 MG tablet [id number] Yes Take 4 mg by mouth daily. [provider] Taking Active   Dulaglutide (TRULICITY) 4.5 EQ/6.8TM SOPN [id number] Yes Inject 4.5 mg as directed once a week. Leone Haven, MD Taking Active            Med Note De Hollingshead   Tue Jun 15, 2020  4:31 PM)    empagliflozin (JARDIANCE) 25 MG TABS tablet [id number] Yes Take 25 mg by mouth daily. Leone Haven, MD Taking Active   escitalopram (LEXAPRO) 20 MG tablet [id number] Yes TAKE 1 TABLET (20 MG) BY MOUTH DAILY Leone Haven, MD Taking Active   ezetimibe (ZETIA) 10 MG tablet [id number] Yes Take 1 tablet (10 mg total) by mouth daily. Leone Haven, MD Taking Active   furosemide (LASIX) 20 MG tablet [id number] Yes Take one tablet (20 mg total) by mouth daily. Leone Haven, MD Taking Active   gabapentin (NEURONTIN) 400 MG capsule [id number] Yes Take 1 capsule (400 mg total) by mouth 3 (three) times daily. Leone Haven, MD Taking Active            Med Note Mayo Ao Sep 13, 2020  4:26 PM) Taking PRN  hydrALAZINE (APRESOLINE) 10 MG tablet [id number] Yes TAKE 1 TABLET(10 MG) BY MOUTH IN THE MORNING AND AT BEDTIME Leone Haven, MD Taking Active   Insulin Glargine [facility] KWIKPEN) 100 UNIT/ML  [id number] Yes Inject 24 Units into the skin daily. Leone Haven, MD Taking Active   insulin lispro [facility]) 100 UNIT/ML KwikPen [id number] Yes Inject 16 Units into the skin 3 (three) times daily. Leone Haven, MD Taking Active   Insulin Pen Needle (PEN NEEDLES) 32G X 4 MM MISC [id number] Yes Use to inject insulin up to 4 times daily Leone Haven, MD Taking Active   losartan (COZAAR) 100 MG tablet [id number] Yes TAKE 1 TABLET EVERY DAY Leone Haven, MD Taking Active   magnesium 30 MG tablet [id number] Yes Take 30 mg by mouth 2 (two) times daily. [provider]  Taking Active   metFORMIN (GLUCOPHAGE XR) 500 MG 24 hr tablet [id number] Yes Take 2 tablets (1,000 mg total) by mouth in the morning and at bedtime. Leone Haven, MD Taking Active   metoprolol succinate (TOPROL-XL) 100 MG 24 hr tablet [id number] Yes TAKE 1 TABLET EVERY DAY Leone Haven, MD Taking Active   Multiple Vitamin (MULTIVITAMIN) tablet [id number] Yes Take 1 tablet by mouth daily. [provider] Taking Active   pantoprazole (PROTONIX) 40 MG tablet [id number] Yes Take 1 tablet (40 mg total) by mouth daily. Leone Haven, MD Taking Active   potassium chloride (KLOR-CON) 10 MEQ tablet [id number] Yes TAKE 1 TABLET BY MOUTH DAILY WITH LASIX Leone Haven, MD Taking Active   sildenafil (VIAGRA) 50 MG tablet [id number] Yes Take 1 tablet (50 mg total) by mouth daily as needed for erectile dysfunction. Leone Haven, MD Taking Active   Zinc Sulfate (ZINC 15 PO) [id number] Yes Take by mouth. [provider] Taking Active             Patient Active Problem List   Diagnosis Date Noted   Erectile dysfunction 06/29/2020   Tick bite of foot 06/29/2020   Headache 05/13/2020   Overweight 05/30/2019   Benign hypertensive kidney disease with chronic kidney disease 01/22/2019   Secondary hyperparathyroidism of renal origin ([place]) 01/22/2019   Stage 3a chronic kidney  disease ([place]) 01/22/2019   Lightheadedness 12/19/2018   Skin lesions 01/21/2018   Esophagitis    Proteinuria 10/10/2017   Onychomycosis 03/19/2017   Anxiety and depression 09/19/2016   Hypokalemia 09/19/2016   Loss of height 05/01/2016   Left shoulder pain 01/31/2016   Sleep apnea 11/22/2011   Chronic back pain 08/17/2011   Hyperlipidemia 08/17/2011   Gout 08/17/2011   Type 2 diabetes, uncontrolled, with neuropathy ([place]) 02/20/2011   Neuropathy ([place]) 02/17/2011   Hypertension 10/13/2010    Immunization History  Administered Date(s) Administered   Fluad Quad(high Dose 65+) 09/25/2019   Influenza Split 10/13/2010, 11/22/2011   Influenza, High Dose Seasonal PF 11/01/2015, 10/30/2016, 10/10/2017   Influenza,inj,Quad PF,6+ Mos 09/27/2012, 10/20/2013, 09/28/2014   Moderna Sars-Covid-2 Vaccination 03/20/2019, 04/17/2019   Pneumococcal Conjugate-13 04/14/2013   Pneumococcal Polysaccharide-23 08/17/2011   Tdap 08/16/2009    Conditions to be addressed/monitored: HTN, HLD, and DMII  Care Plan : Medication Management  Updates made by De Hollingshead, RPH-CPP since 09/30/2020 12:00 AM     Problem: Diabetes, Hypertension, CKD, Depression      Long-Range Goal: Disease Progression Prevention   This Visit's Progress: On track  Recent Progress: On track  Priority: High  Note:   Current Barriers:  Unable to independently afford treatment regimen Unable to independently monitor therapeutic efficacy Unable to achieve control of diabetes   Pharmacist Clinical Goal(s):  Over the next 90 days, patient will verbalize ability to afford treatment regimen. Over the next 90 days, patient will achieve control of diabetes as evidenced by improvement in A1c through collaboration with PharmD and provider.   Interventions: 1:1 collaboration with Leone Haven, MD regarding development and update of comprehensive plan of care as evidenced by provider attestation and  co-signature Inter-disciplinary care team collaboration (see longitudinal plan of care) Comprehensive medication review performed; medication list updated in electronic medical record  Diabetes: Uncontrolled; current treatment: metformin XR 1000 mg BID, Jardiance 25 mg daily, Trulicity 4.5 mg; Basaglar 24-26 units once to twice daily per his report, Humalog 14 units with breakfast and 12 units with supper, though reports he  is using 18-24 units with meals. He cannot verbalize a specific sliding scale that he uses and we have 1 day of glycemic data available at this time.  Using [place] 2 CGM. Appears he was able to restart CGM monitoring.  A1c improved. Reviewed w/ PCP, metformin dose reduced. Patient will follow up with nephrology.  Hypertension: Controlled per clinic reading today; current treatment: furosemide 20 mg QAM, losartan 100 mg QAM, metoprolol XL 100 mg QAM, amlodipine 10 mg daily QAM, doxazosin 4 mg QPM; hydralazine 10 mg BID Recommended to continue current regimen at this time. Potassium supplement as per lab results. Scheduled repeat BMP next week.   Hyperlipidemia: Controlled; current treatment: atorvastatin 80 mg, ezetimibe 10 mg  Antiplatelet regimen: aspirin 325 mg daily (hx CVA per chart review) Recommended to continue current regimen at this time. Lipids pending.   Depression: Controlled per patient report; current treatment: escitalopram 20 mg daily Previously recommended to continue current regimen at this time.   Peripheral Neuropathy: Improved per patient report; current regimen: gabapentin 400 mg TID - though he is taking PRN Previously recommended to continue current regimen at this time. Follow repeat BMP and adjust gabapentin as needed.   Gout: Controlled per patient report; current regimen: allopurinol 100 mg daily  Last uric acid WNL Previously recommended to continue current regimen at this time  GERD: Moderately well controlled per patient report;  current regimen: pantoprazole 40 mg daily Reports GERD symptoms, but he is not necessarily taking before meals. Advised to take about 30 minutes prior to meals Previously recommended to continue current regimen at this time  Patient Goals/Self-Care Activities Over the next 90 days, patient will:  - take medications as prescribed check glucose three times daily using CGM, document, and provide at future appointments check blood pressure periodically, document, and provide at future appointments collaborate with provider on medication access solutions  Follow Up Plan:  Face to Face appointment with care management team member scheduled for:  ~ 6 weeks     Medication Assistance: Trulicity, Basaglar, Humalog obtained through [employer] medication assistance program.  Enrollment ends 01/01/21 . Jardiance obtained through [facility] through 01/01/21.   Patient's preferred pharmacy is:  [id number] [address], [address] [place] [address] Phone: [phone] Fax: [phone]  [facility] [place], [address] [place] [address] Phone: [phone] Fax: [phone]    Follow Up:  Patient agrees to Care Plan and Follow-up.  Plan: Face to Face appointment with care management team member scheduled for: 6 weeks  Catie Darnelle Maffucci, PharmD, [place], [place] Clinical Pharmacist [employer] [facility] [employer] [phone]

## 2020-10-01 DIAGNOSIS — E785 Hyperlipidemia, unspecified: Secondary | ICD-10-CM | POA: Diagnosis not present

## 2020-10-01 DIAGNOSIS — F419 Anxiety disorder, unspecified: Secondary | ICD-10-CM | POA: Diagnosis not present

## 2020-10-01 DIAGNOSIS — I10 Essential (primary) hypertension: Secondary | ICD-10-CM

## 2020-10-01 DIAGNOSIS — E1140 Type 2 diabetes mellitus with diabetic neuropathy, unspecified: Secondary | ICD-10-CM | POA: Diagnosis not present

## 2020-10-01 DIAGNOSIS — E1165 Type 2 diabetes mellitus with hyperglycemia: Secondary | ICD-10-CM | POA: Diagnosis not present

## 2020-10-01 DIAGNOSIS — F32A Depression, unspecified: Secondary | ICD-10-CM

## 2020-10-01 NOTE — Telephone Encounter (Signed)
Spoke with patient yesterday, see CCM documentation

## 2020-10-06 ENCOUNTER — Other Ambulatory Visit (INDEPENDENT_AMBULATORY_CARE_PROVIDER_SITE_OTHER): Payer: Medicare Other

## 2020-10-06 ENCOUNTER — Other Ambulatory Visit: Payer: Self-pay

## 2020-10-06 DIAGNOSIS — E876 Hypokalemia: Secondary | ICD-10-CM | POA: Diagnosis not present

## 2020-10-06 LAB — BASIC METABOLIC PANEL
BUN: 14 mg/dL (ref 6–23)
CO2: 31 mEq/L (ref 19–32)
Calcium: 9.1 mg/dL (ref 8.4–10.5)
Chloride: 103 mEq/L (ref 96–112)
Creatinine, Ser: 1.28 mg/dL (ref 0.40–1.50)
GFR: 53.97 mL/min — ABNORMAL LOW (ref >60.00)
Glucose, Bld: 137 mg/dL — ABNORMAL HIGH (ref 70–99)
Potassium: 3.1 mEq/L — ABNORMAL LOW (ref 3.5–5.1)
Sodium: 142 mEq/L (ref 135–145)

## 2020-11-15 ENCOUNTER — Encounter: Payer: Self-pay | Admitting: Adult Health

## 2020-11-15 ENCOUNTER — Other Ambulatory Visit: Payer: Self-pay

## 2020-11-15 ENCOUNTER — Ambulatory Visit (INDEPENDENT_AMBULATORY_CARE_PROVIDER_SITE_OTHER): Payer: Medicare Other | Admitting: Adult Health

## 2020-11-15 ENCOUNTER — Telehealth: Payer: Medicare Other

## 2020-11-15 VITALS — BP 142/86 | HR 83 | Ht 65.98 in | Wt 176.6 lb

## 2020-11-15 DIAGNOSIS — Z8639 Personal history of other endocrine, nutritional and metabolic disease: Secondary | ICD-10-CM | POA: Diagnosis not present

## 2020-11-15 DIAGNOSIS — R300 Dysuria: Secondary | ICD-10-CM

## 2020-11-15 DIAGNOSIS — R3915 Urgency of urination: Secondary | ICD-10-CM | POA: Diagnosis not present

## 2020-11-15 LAB — CBC WITH DIFFERENTIAL/PLATELET
Basophils Absolute: 0 10*3/uL (ref 0.0–0.1)
Basophils Relative: 0.6 % (ref 0.0–3.0)
Eosinophils Absolute: 0.2 10*3/uL (ref 0.0–0.7)
Eosinophils Relative: 2.6 % (ref 0.0–5.0)
HCT: 42.8 % (ref 39.0–52.0)
Hemoglobin: 15.2 g/dL (ref 13.0–17.0)
Lymphocytes Relative: 30.6 % (ref 12.0–46.0)
Lymphs Abs: 2.3 10*3/uL (ref 0.7–4.0)
MCHC: 35.5 g/dL (ref 30.0–36.0)
MCV: 93.7 fl (ref 78.0–100.0)
Monocytes Absolute: 0.6 10*3/uL (ref 0.1–1.0)
Monocytes Relative: 8.1 % (ref 3.0–12.0)
Neutro Abs: 4.4 10*3/uL (ref 1.4–7.7)
Neutrophils Relative %: 58.1 % (ref 43.0–77.0)
Platelets: 178 10*3/uL (ref 150.0–400.0)
RBC: 4.57 Mil/uL (ref 4.22–5.81)
RDW: 13.2 % (ref 11.5–15.5)
WBC: 7.5 10*3/uL (ref 4.0–10.5)

## 2020-11-15 LAB — COMPREHENSIVE METABOLIC PANEL
ALT: 14 U/L (ref 0–53)
AST: 16 U/L (ref 0–37)
Albumin: 4.2 g/dL (ref 3.5–5.2)
Alkaline Phosphatase: 77 U/L (ref 39–117)
BUN: 29 mg/dL — ABNORMAL HIGH (ref 6–23)
CO2: 29 mEq/L (ref 19–32)
Calcium: 9 mg/dL (ref 8.4–10.5)
Chloride: 100 mEq/L (ref 96–112)
Creatinine, Ser: 1.82 mg/dL — ABNORMAL HIGH (ref 0.40–1.50)
GFR: 35.35 mL/min — ABNORMAL LOW (ref >60.00)
Glucose, Bld: 307 mg/dL — ABNORMAL HIGH (ref 70–99)
Potassium: 3.4 mEq/L — ABNORMAL LOW (ref 3.5–5.1)
Sodium: 139 mEq/L (ref 135–145)
Total Bilirubin: 0.8 mg/dL (ref 0.2–1.2)
Total Protein: 6.7 g/dL (ref 6.0–8.3)

## 2020-11-15 LAB — URINALYSIS, MICROSCOPIC ONLY: RBC / HPF: NONE SEEN (ref 0–?)

## 2020-11-15 LAB — PSA: PSA: 1.13 ng/mL (ref 0.10–4.00)

## 2020-11-15 MED ORDER — CIPROFLOXACIN HCL 500 MG PO TABS: 500.0000 mg | ORAL_TABLET | Freq: Two times a day (BID) | ORAL | 0 refills | Status: DC

## 2020-11-15 NOTE — Progress Notes (Signed)
Patient potassium has improved, glucose not controlled. Kidney function decreased. PSA normal limits/  Continue the potassium 72meq you are taking once daily by mouth. Recommend recheck in 2 weeks CBC.  Better glucose control advised with medication adherence,diet and exercise. CBC within normal limits.  Complete Cipro and if not improved completely return to office as discussed and keep regular health maintenace appointment.  Pending urine culture.  Has pharm D appointment 11/15/2020 today.

## 2020-11-15 NOTE — Progress Notes (Signed)
Acute Office Visit  Subjective:    Patient ID: Shane Jones, male    DOB: [birth year], 75 y.o.   MRN: [id number]  Chief Complaint  Patient presents with   urinary symptoms    HPI Patient is in today for complaints of  urinary urgency / frequency intermittent since Saturday 11/13/20. He reports urinary dribbling and frequency, with urinary burning. Denies any history of prostate problems in the past. He has no rectal pain or pressure.  Denies any gross hematuria. He has not been checking glucose. Denies any excessive thirst, or hunger. Denies any prostate history in the past.  Denies any back injury. Appetite is good.  Lost brother 11/13/20 Last labs glucose was 137, potassium was 3.1 on 10/06/20 Leone Haven, MD- PCP called to verify if patient was taking but was unable to reach him. GFR was 53.97on 10/06/20  improved from previous 09/29/20. He reports he is taking Klor- Con 36mq once daily.     Patient  denies any fever, body aches,chills, rash, chest pain, shortness of breath, nausea, vomiting, or diarrhea.     Past Medical History:  Diagnosis Date   Diabetes mellitus without complication ([facility]    Gout    Hypertension    Sleep apnea    has CPAP, doesn't use   Ulcer of esophagus without bleeding    Wears dentures    full upper and lower    Past Surgical History:  Procedure Laterality Date   BALLOON DILATION N/A 10/22/2017   Procedure: BALLOON DILATION;  Surgeon: WLucilla Lame MD;  [facility]  Service: Endoscopy;  Laterality: N/A;   ESOPHAGOGASTRODUODENOSCOPY (EGD) WITH PROPOFOL N/A 10/22/2017   Procedure: ESOPHAGOGASTRODUODENOSCOPY (EGD) WITH Biopsy;  Surgeon: WLucilla Lame MD;  [facility]  Service: Endoscopy;  Laterality: N/A;   TONSILLECTOMY      History reviewed. No pertinent family history.  Social History   Socioeconomic History   Marital status: Widowed    Spouse name: Not on file   Number of children: Not on file    Years of education: Not on file   Highest education level: Not on file  Occupational History   Not on file  Tobacco Use   Smoking status: Former    Types: Cigarettes    Quit date: 11/20/1978    Years since quitting: 42.0   Smokeless tobacco: Never  Vaping Use   Vaping Use: Never used  Substance and Sexual Activity   Alcohol use: Yes    Alcohol/week: 1.0 standard drink    Types: 1 Cans of beer per week    Comment: rare   Drug use: No   Sexual activity: Not on file  Other Topics Concern   Not on file  Social History Narrative   Not on file   Social Determinants of Health   Financial Resource Strain: Medium Risk   Difficulty of Paying Living Expenses: Somewhat hard  Food Insecurity: No Food Insecurity   Worried About Running Out of Food in the Last Year: Never true   Ran Out of Food in the Last Year: Never true  Transportation Needs: No Transportation Needs   Lack of Transportation (Medical): No   Lack of Transportation (Non-Medical): No  Physical Activity: Sufficiently Active   Days of Exercise per Week: 3 days   Minutes of Exercise per Session: 60 min  Stress: No Stress Concern Present   Feeling of Stress : Not at all  Social Connections: Socially Isolated   Frequency of  Communication with Friends and Family: Once a week   Frequency of Social Gatherings with Friends and Family: Once a week   Attends Religious Services: Never   Marine scientist or Organizations: Yes   Attends Archivist Meetings: Never   Marital Status: Widowed  Human resources officer Violence: Not At Risk   Fear of Current or Ex-Partner: No   Emotionally Abused: No   Physically Abused: No   Sexually Abused: No    Outpatient Medications Prior to Visit  Medication Sig Dispense Refill   Accu-Chek FastClix Lancets MISC USE UP TO FOUR TIMES DAILY AS DIRECTED 102 each 2   ACCU-CHEK GUIDE test strip USE UP TO FOUR TIMES DAILY AS DIRECTED 100 each 2   allopurinol (ZYLOPRIM) 100 MG tablet  TAKE 1 TABLET DAILY 90 tablet 1   amLODipine (NORVASC) 10 MG tablet TAKE 1 TABLET EVERY DAY 90 tablet 1   aspirin 325 MG tablet Take 325 mg by mouth daily.     atorvastatin (LIPITOR) 80 MG tablet TAKE 1 TABLET EVERY DAY 90 tablet 1   B Complex Vitamins (VITAMIN-B COMPLEX PO) Take by mouth.     BETA CAROTENE PO Take by mouth.     blood glucose meter kit and supplies Dispense based on patient and insurance preference. Use up to four times daily as directed. (FOR ICD-10 E10.9, E11.9). 1 each 0   Calcium Carbonate-Vitamin D (CALCIUM + D PO) Take by mouth.     Continuous Blood Gluc Sensor (FREESTYLE LIBRE 2 SENSOR) MISC by Does not apply route.     doxazosin (CARDURA) 4 MG tablet Take 4 mg by mouth daily.     Dulaglutide (TRULICITY) 4.5 TM/2.2QJ SOPN Inject 4.5 mg as directed once a week. 6 mL 1   empagliflozin (JARDIANCE) 25 MG TABS tablet Take 25 mg by mouth daily. 90 tablet 3   escitalopram (LEXAPRO) 20 MG tablet TAKE 1 TABLET (20 MG) BY MOUTH DAILY 90 tablet 1   ezetimibe (ZETIA) 10 MG tablet Take 1 tablet (10 mg total) by mouth daily. 90 tablet 1   furosemide (LASIX) 20 MG tablet Take one tablet (20 mg total) by mouth daily. 90 tablet 1   gabapentin (NEURONTIN) 400 MG capsule Take 1 capsule (400 mg total) by mouth 3 (three) times daily. 270 capsule 0   hydrALAZINE (APRESOLINE) 10 MG tablet TAKE 1 TABLET(10 MG) BY MOUTH IN THE MORNING AND AT BEDTIME 60 tablet 2   Insulin Glargine (BASAGLAR KWIKPEN) 100 UNIT/ML Inject 26 Units into the skin daily. 15 mL 0   insulin lispro (HUMALOG KWIKPEN) 100 UNIT/ML KwikPen Inject 20-22 Units into the skin 2 (two) times daily with a meal. 3 mL 0   Insulin Pen Needle (PEN NEEDLES) 32G X 4 MM MISC Use to inject insulin up to 4 times daily 400 each 3   losartan (COZAAR) 100 MG tablet TAKE 1 TABLET EVERY DAY 90 tablet 1   magnesium 30 MG tablet Take 30 mg by mouth 2 (two) times daily.     metFORMIN (GLUCOPHAGE XR) 500 MG 24 hr tablet Take 1 tablet (500 mg total) by  mouth in the morning and at bedtime. 180 tablet 3   metoprolol succinate (TOPROL-XL) 100 MG 24 hr tablet TAKE 1 TABLET EVERY DAY 90 tablet 1   Multiple Vitamin (MULTIVITAMIN) tablet Take 1 tablet by mouth daily.     pantoprazole (PROTONIX) 40 MG tablet Take 1 tablet (40 mg total) by mouth daily. 90 tablet 0  sildenafil (VIAGRA) 50 MG tablet Take 1 tablet (50 mg total) by mouth daily as needed for erectile dysfunction. 10 tablet 0   Zinc Sulfate (ZINC 15 PO) Take by mouth.     potassium chloride (KLOR-CON) 10 MEQ tablet TAKE 1 TABLET BY MOUTH DAILY WITH LASIX 90 tablet 1   potassium chloride SA (KLOR-CON) 20 MEQ tablet Take 2 tablets (40 mEq total) by mouth daily for 3 days. 6 tablet 0   No facility-administered medications prior to visit.    Allergies  Allergen Reactions   Uloric [Febuxostat] Hives    hives    Review of Systems  Constitutional: Negative.   HENT: Negative.    Respiratory: Negative.    Cardiovascular: Negative.   Genitourinary:  Positive for dysuria, frequency and urgency. Negative for decreased urine volume, difficulty urinating, enuresis, flank pain, genital sores, hematuria, penile discharge, penile pain, penile swelling, scrotal swelling and testicular pain.  Musculoskeletal: Negative.       Objective:    Physical Exam Constitutional:      General: He is not in acute distress.    Appearance: He is not ill-appearing, toxic-appearing or diaphoretic.     Comments: Slightly Disheveled appearance   HENT:     Head: Normocephalic and atraumatic.     Right Ear: External ear normal.     Left Ear: External ear normal.     Nose: Nose normal.     Mouth/Throat:     Pharynx: Oropharynx is clear.  Eyes:     Conjunctiva/sclera: Conjunctivae normal.  Cardiovascular:     Rate and Rhythm: Normal rate and regular rhythm.     Pulses: Normal pulses.     Heart sounds: Normal heart sounds.  Pulmonary:     Effort: Pulmonary effort is normal.     Breath sounds: Normal  breath sounds.  Abdominal:     General: There is no distension.     Palpations: Abdomen is soft. There is no mass.     Tenderness: There is no abdominal tenderness.     Hernia: No hernia is present.  Genitourinary:    Comments: Deferred  Musculoskeletal:        General: Normal range of motion.     Cervical back: Normal range of motion and neck supple.  Lymphadenopathy:     Cervical: No cervical adenopathy.  Skin:    General: Skin is warm.  Neurological:     Mental Status: He is alert.  Psychiatric:        Mood and Affect: Mood normal.        Behavior: Behavior normal.        Thought Content: Thought content normal.        Judgment: Judgment normal.    BP (!) 142/86   Pulse 83   Ht 5' 5.98" (1.676 m)   Wt 176 lb 9.6 oz (80.1 kg)   SpO2 95%   BMI 28.52 kg/m  Wt Readings from Last 3 Encounters:  11/15/20 176 lb 9.6 oz (80.1 kg)  09/29/20 176 lb (79.8 kg)  06/29/20 172 lb 9.6 oz (78.3 kg)    Health Maintenance Due  Topic Date Due   Zoster Vaccines- Shingrix (1 of 2) Never done   COVID-19 Vaccine (3 - Booster for Moderna series) 06/12/2019   INFLUENZA VACCINE  08/02/2020    There are no preventive care reminders to display for this patient.   Lab Results  Component Value Date   TSH 1.11 12/26/2018   Lab Results  Component  Value Date   WBC 6.2 12/26/2018   HGB 16.2 12/26/2018   HCT 46.3 12/26/2018   MCV 95.6 12/26/2018   PLT 188.0 12/26/2018   Lab Results  Component Value Date   NA 142 10/06/2020   K 3.1 (L) 10/06/2020   CO2 31 10/06/2020   GLUCOSE 137 (H) 10/06/2020   BUN 14 10/06/2020   CREATININE 1.28 10/06/2020   BILITOT 0.7 08/30/2020   ALKPHOS 83 08/30/2020   AST 16 08/30/2020   ALT 10 08/30/2020   PROT 6.8 08/30/2020   ALBUMIN 4.2 08/30/2020   CALCIUM 9.1 10/06/2020   GFR 53.97 (L) 10/06/2020   Lab Results  Component Value Date   CHOL 107 09/29/2020   Lab Results  Component Value Date   HDL 31.90 (L) 09/29/2020   Lab Results   Component Value Date   LDLCALC 23 08/07/2016   Lab Results  Component Value Date   TRIG 208.0 (H) 09/29/2020   Lab Results  Component Value Date   CHOLHDL 3 09/29/2020   Lab Results  Component Value Date   HGBA1C 7.3 (H) 09/29/2020       Assessment & Plan:   Problem List Items Addressed This Visit       Other   Urinary urgency   Relevant Medications   ciprofloxacin (CIPRO) 500 MG tablet   Other Relevant Orders   PSA   History of hypokalemia   Relevant Orders   Comprehensive metabolic panel   Other Visit Diagnoses     Dysuria    -  Primary   Relevant Medications   ciprofloxacin (CIPRO) 500 MG tablet   Other Relevant Orders   CBC with Differential/Platelet   Comprehensive metabolic panel   Urine Microscopic Only   CULTURE, URINE COMPREHENSIVE      Will check labs today, urine microscopic and culture. Deferred prostate exam.  Recheck potassium- he is taking Klor Con 61mq he reports once daily  Recheck GRF   Meds ordered this encounter  Medications   ciprofloxacin (CIPRO) 500 MG tablet    Sig: Take 1 tablet (500 mg total) by mouth 2 (two) times daily.    Dispense:  14 tablet    Refill:  0   He has a appointment with CJolinda CroakD this afternoon.   Red Flags discussed. The patient was given clear instructions to go to ER or return to medical center if any red flags develop, symptoms do not improve, worsen or new problems develop. They verbalized understanding.  Return in about 1 week (around 11/22/2020), or if symptoms worsen or fail to improve, for at any time for any worsening symptoms, Go to Emergency room/ urgent care if worse.   MMarcille Buffy FNP

## 2020-11-15 NOTE — Patient Instructions (Signed)
Ciprofloxacin Tablets What is this medication? CIPROFLOXACIN (sip roe FLOX a sin) treats infections caused by bacteria. It belongs to a group of medications called quinolone antibiotics. It will not treat colds, the flu, or infections caused by viruses. This medicine may be used for other purposes; ask your health care provider or pharmacist if you have questions. COMMON BRAND NAME(S): Cipro What should I tell my care team before I take this medication? They need to know if you have any of these conditions: Bone problems Diabetes Heart disease High blood pressure History of irregular heartbeat History of low levels of potassium in the blood Joint problems Kidney disease Liver disease Mental illness Myasthenia gravis Seizures Tendon problems Tingling of the fingers or toes, or other nerve disorder An unusual or allergic reaction to ciprofloxacin, other antibiotics or medications, foods, dyes, or preservatives Pregnant or trying to get pregnant Breast-feeding How should I use this medication? Take this medication by mouth with a full glass of water. Take it as directed on the prescription label at the same time every day. Do not crush or chew this medication. You may cut the tablet in half if it is scored (has a line in the middle of it). This may help you swallow the tablet if the whole tablet is too big. You can take it with or without food. If it upsets your stomach, take it with food. Take all of this medication unless your care team tells you to stop it early. Keep taking it even if you think you are better. Take products with aluminum, calcium, iron, magnesium, and zinc in them at a different time of day than this medication. Take these products 6 hours BEFORE or 2 hours AFTER taking a dose of this medication. A special MedGuide will be given to you by the pharmacist with each prescription and refill. Be sure to read this information carefully each time. Talk to your care team  regarding the use of this medication in children. Special care may be needed. Overdosage: If you think you have taken too much of this medicine contact a poison control center or emergency room at once. NOTE: This medicine is only for you. Do not share this medicine with others. What if I miss a dose? If you miss a dose, take it as soon as you can. If it is almost time for your next dose, take only that dose. Do not take double or extra doses. What may interact with this medication? Do not take this medication with any of the following: Cisapride Dronedarone Flibanserin Lomitapide Pimozide Thioridazine Tizanidine This medication may also interact with the following: Antacids Birth control pills Caffeine Certain medications for diabetes, like glipizide, glyburide, or insulin Certain medications that treat or prevent blood clots like warfarin Clozapine Cyclosporine Didanosine buffered tablets or powder Dofetilide Duloxetine Lanthanum carbonate Lidocaine Methotrexate Multivitamins NSAIDS, medications for pain and inflammation, like ibuprofen or naproxen Olanzapine Omeprazole Other medications that prolong the QT interval (cause an abnormal heart rhythm) Phenytoin Probenecid Ropinirole Sevelamer Sildenafil Sucralfate Theophylline Ziprasidone Zolpidem This list may not describe all possible interactions. Give your health care provider a list of all the medicines, herbs, non-prescription drugs, or dietary supplements you use. Also tell them if you smoke, drink alcohol, or use illegal drugs. Some items may interact with your medicine. What should I watch for while using this medication? Tell your care team if your symptoms do not start to get better or if they get worse. This medication may cause serious skin reactions.  They can happen weeks to months after starting the medication. Contact your care team right away if you notice fevers or flu-like symptoms with a rash. The rash  may be red or purple and then turn into blisters or peeling of the skin. Or, you might notice a red rash with swelling of the face, lips or lymph nodes in your neck or under your arms. Do not treat diarrhea with over the counter products. Contact your care team if you have diarrhea that lasts more than 2 days or if it is severe and watery. Check with your care team if you get an attack of severe diarrhea, nausea and vomiting, or if you sweat a lot. The loss of too much body fluid can make it dangerous for you to take this medication. This medication may increase blood sugar. Ask your care team if changes in diet or medications are needed if you have diabetes. You may get drowsy or dizzy. Do not drive, use machinery, or do anything that needs mental alertness until you know how this medication affects you. Do not sit or stand up quickly, especially if you are an older patient. This reduces the risk of dizzy or fainting spells. This medication can make you more sensitive to the sun. Keep out of the sun. If you cannot avoid being in the sun, wear protective clothing and use sunscreen. Do not use sun lamps or tanning beds/booths. What side effects may I notice from receiving this medication? Side effects that you should report to your care team as soon as possible: Allergic reactions--skin rash, itching, hives, swelling of the face, lips, tongue, or throat Heart rhythm changes--fast or irregular heartbeat, dizziness, feeling faint or lightheaded, chest pain, trouble breathing Increased pressure around the brain--severe headache, blurry vision, change in vision, nausea, vomiting Joint, muscle, or tendon pain, swelling, or stiffness Liver injury--right upper belly pain, loss of appetite, nausea, light-colored stool, dark yellow or brown urine, yellowing skin or eyes, unusual weakness or fatigue Mood and behavior changes--anxiety, nervousness, confusion, hallucinations, irritability, hostility, thoughts of  suicide or self-harm, worsening mood, feelings of depression Pain, tingling, or numbness in the hands or feet Redness, blistering, peeling, or loosening of the skin, including inside the mouth Seizures Severe diarrhea, fever Sudden or severe chest, back, or stomach pain Unusual vaginal discharge, itching, or odor Side effects that usually do not require medical attention (report to your care team if they continue or are bothersome): Diarrhea Dry mouth Headache Nausea This list may not describe all possible side effects. Call your doctor for medical advice about side effects. You may report side effects to FDA at 1-800-FDA-1088. Where should I keep my medication? Keep out of the reach of children and pets. Store at room temperature below 30 degrees C (86 degrees F). Keep container tightly closed. Throw away any unused medication after the expiration date. NOTE: This sheet is a summary. It may not cover all possible information. If you have questions about this medicine, talk to your doctor, pharmacist, or health care provider.  2022 Elsevier/Gold Standard (2020-03-06 00:00:00) Urinary Tract Infection, Adult A urinary tract infection (UTI) is an infection of any part of the urinary tract. The urinary tract includes: The kidneys. The ureters. The bladder. The urethra. These organs make, store, and get rid of pee (urine) in the body. What are the causes? This infection is caused by germs (bacteria) in your genital area. These germs grow and cause swelling (inflammation) of your urinary tract. What increases the risk?  The following factors may make you more likely to develop this condition: Using a small, thin tube (catheter) to drain pee. Not being able to control when you pee or poop (incontinence). Being male. If you are male, these things can increase the risk: Using these methods to prevent pregnancy: A medicine that kills sperm (spermicide). A device that blocks sperm  (diaphragm). Having low levels of a male hormone (estrogen). Being pregnant. You are more likely to develop this condition if: You have genes that add to your risk. You are sexually active. You take antibiotic medicines. You have trouble peeing because of: A prostate that is bigger than normal, if you are male. A blockage in the part of your body that drains pee from the bladder. A kidney stone. A nerve condition that affects your bladder. Not getting enough to drink. Not peeing often enough. You have other conditions, such as: Diabetes. A weak disease-fighting system (immune system). Sickle cell disease. Gout. Injury of the spine. What are the signs or symptoms? Symptoms of this condition include: Needing to pee right away. Peeing small amounts often. Pain or burning when peeing. Blood in the pee. Pee that smells bad or not like normal. Trouble peeing. Pee that is cloudy. Fluid coming from the vagina, if you are male. Pain in the belly or lower back. Other symptoms include: Vomiting. Not feeling hungry. Feeling mixed up (confused). This may be the first symptom in older adults. Being tired and grouchy (irritable). A fever. Watery poop (diarrhea). How is this treated? Taking antibiotic medicine. Taking other medicines. Drinking enough water. In some cases, you may need to see a specialist. Follow these instructions at home: Medicines Take over-the-counter and prescription medicines only as told by your doctor. If you were prescribed an antibiotic medicine, take it as told by your doctor. Do not stop taking it even if you start to feel better. General instructions Make sure you: Pee until your bladder is empty. Do not hold pee for a long time. Empty your bladder after sex. Wipe from front to back after peeing or pooping if you are a male. Use each tissue one time when you wipe. Drink enough fluid to keep your pee pale yellow. Keep all follow-up  visits. Contact a doctor if: You do not get better after 1-2 days. Your symptoms go away and then come back. Get help right away if: You have very bad back pain. You have very bad pain in your lower belly. You have a fever. You have chills. You feeling like you will vomit or you vomit. Summary A urinary tract infection (UTI) is an infection of any part of the urinary tract. This condition is caused by germs in your genital area. There are many risk factors for a UTI. Treatment includes antibiotic medicines. Drink enough fluid to keep your pee pale yellow. This information is not intended to replace advice given to you by your health care provider. Make sure you discuss any questions you have with your health care provider. Document Revised: 08/01/2019 Document Reviewed: 08/01/2019 Elsevier Patient Education  [place].

## 2020-11-16 ENCOUNTER — Telehealth: Payer: Self-pay | Admitting: Pharmacist

## 2020-11-16 ENCOUNTER — Telehealth: Payer: Medicare Other

## 2020-11-16 ENCOUNTER — Telehealth: Payer: Self-pay

## 2020-11-16 NOTE — Chronic Care Management (AMB) (Signed)
  Care Management   Note  11/16/2020 Name: Shane Jones MRN: [id number] DOB: 1945-09-29  Shane Jones is a 75 y.o. year old male who is a primary care patient of Leone Haven, MD and is actively engaged with the care management team. I reached out to Irean Hong by phone today to assist with re-scheduling a follow up visit with the Pharmacist  Follow up plan: Unsuccessful telephone outreach attempt made.  The care management team will reach out to the patient again over the next 5 days.  If patient returns call to provider office, please advise to call [facility]  at [facility], [place], [place], [place] [address] Direct Dial: [phone] [email or web address]

## 2020-11-16 NOTE — Telephone Encounter (Signed)
My apologies for the inappropriate slot scheduled, I didn't realize. Pt originally called to apologize for missing initial appointment due to brother passing. Pt is requesting to speak to you regarding this. I looked on the schedule and the next available currently would be 11/23 at 11 if he needs to be rescheduled.  [phone]

## 2020-11-16 NOTE — Telephone Encounter (Signed)
Routing to care guide to call to r/s. Thanks!

## 2020-11-16 NOTE — Telephone Encounter (Signed)
  Chronic Care Management   Note  11/16/2020 Name: KYLIL SWOPES MRN: [id number] DOB: 27-Jun-1945   Attempted to contact patient for scheduled appointment for medication management support yesterday. Unable to leave voicemail.   Phone appointment scheduled today in an inappropriate slot. Will route to Care Guide to have patient rescheduled.   Catie Darnelle Maffucci, PharmD, [place], [place] Clinical Pharmacist [employer] at [employer] [phone]

## 2020-11-17 LAB — CULTURE, URINE COMPREHENSIVE
MICRO NUMBER:: 12633322
RESULT:: NO GROWTH
SPECIMEN QUALITY:: ADEQUATE

## 2020-11-17 NOTE — Progress Notes (Signed)
11/17/20 11:33 urine culture still pending.

## 2020-11-18 NOTE — Progress Notes (Signed)
No growth in urine, please verify if his symptoms are improving with the Cipro. If not I want him to have a refferal to urology please check with him on how he is doing with his urinary symptoms.

## 2020-11-22 NOTE — Chronic Care Management (AMB) (Signed)
  Care Management   Note  11/22/2020 Name: Shane Jones MRN: [id number] DOB: 10-Jan-1945  Shane Jones is a 75 y.o. year old male who is a primary care patient of Leone Haven, MD and is actively engaged with the care management team. I reached out to Irean Hong by phone today to assist with re-scheduling a follow up visit with the Pharmacist  Follow up plan: Unsuccessful telephone outreach attempt made.  The care management team will reach out to the patient again over the next 5 days.  If patient returns call to provider office, please advise to call [place] at [place], [place], [place], [place] [address] Direct Dial: [phone] [email or web address] Website: [email or web address]

## 2020-11-25 DIAGNOSIS — Z20828 Contact with and (suspected) exposure to other viral communicable diseases: Secondary | ICD-10-CM | POA: Diagnosis not present

## 2020-11-29 NOTE — Chronic Care Management (AMB) (Signed)
  Care Management   Note  11/29/2020 Name: [name]: [id number] DOB: 07/20/1945  Shane Jones is a 75 y.o. year old male who is a primary care patient of Leone Haven, MD and is actively engaged with the care management team. I reached out to Irean Hong by phone today to assist with re-scheduling a follow up visit with the Pharmacist  Follow up plan: Unsuccessful telephone outreach attempt made.  The care management team will reach out to the patient again over the next 5 days.  If patient returns call to provider office, please advise to call [place]  at [place], [place], [place], [place] [address] Direct Dial: [phone] [email or web address]: [email or web address]

## 2020-12-02 NOTE — Chronic Care Management (AMB) (Signed)
  Care Management   Note  12/02/2020 Name: Shane Jones MRN: [id number] DOB: June 12, 1945  Shane Jones is a 75 y.o. year old male who is a primary care patient of Shane Haven, MD and is actively engaged with the care management team. I reached out to Shane Jones by phone today to assist with re-scheduling a follow up visit with the Pharmacist  Follow up plan: Unable to make contact on outreach attempts x 4. PCP Shane Haven, MD notified via routed documentation in medical record.   Shane Jones, [place], [place], [place] [address] Direct Dial: [phone] [email or web address]

## 2020-12-02 NOTE — Telephone Encounter (Signed)
4th unsuccessful outreach

## 2020-12-07 ENCOUNTER — Telehealth: Payer: Self-pay | Admitting: Family Medicine

## 2020-12-07 DIAGNOSIS — E785 Hyperlipidemia, unspecified: Secondary | ICD-10-CM

## 2020-12-07 DIAGNOSIS — F320 Major depressive disorder, single episode, mild: Secondary | ICD-10-CM

## 2020-12-07 DIAGNOSIS — I10 Essential (primary) hypertension: Secondary | ICD-10-CM

## 2020-12-07 NOTE — Telephone Encounter (Signed)
The patient is asking for a call concerning his insurance enrollment . He wants to make sure it does screw up anything with his medication.

## 2020-12-07 NOTE — Telephone Encounter (Signed)
Called patient. Unable to leave voicemail as his mailbox is full.   If he returns call, we have been trying to get in touch to schedule a follow up appointment with me to manage medications and discuss manufacturer assistance re-enrollment for 2023. If he returns call - can schedule a 45 min CCM Face to Face visit. I have a few openings next week.

## 2020-12-14 ENCOUNTER — Ambulatory Visit: Payer: Medicare Other | Admitting: Pharmacist

## 2020-12-14 ENCOUNTER — Other Ambulatory Visit: Payer: Self-pay

## 2020-12-14 DIAGNOSIS — N183 Chronic kidney disease, stage 3 unspecified: Secondary | ICD-10-CM

## 2020-12-14 DIAGNOSIS — N1831 Chronic kidney disease, stage 3a: Secondary | ICD-10-CM

## 2020-12-14 DIAGNOSIS — I10 Essential (primary) hypertension: Secondary | ICD-10-CM

## 2020-12-14 DIAGNOSIS — Z794 Long term (current) use of insulin: Secondary | ICD-10-CM

## 2020-12-14 NOTE — Patient Instructions (Addendum)
I'll check on the flu shot.   We recommend the updated COVID booster.   In 2023, the Shingrix (shingles) vaccine series and the Tetanus booster will be a $0 copay at the pharmacy. Talk to your local pharmacy about these.   Take care!  Catie Darnelle Maffucci, PharmD  Visit Information  Following are the goals we discussed today:  Patient Goals/Self-Care Activities Over the next 90 days, patient will:  - take medications as prescribed check glucose three times daily using CGM, document, and provide at future appointments check blood pressure periodically, document, and provide at future appointments collaborate with provider on medication access solutions        Plan: Telephone follow up appointment with care management team member scheduled for:  8 weeks   Catie Darnelle Maffucci, PharmD, [place], CPP Clinical Pharmacist [place] at [facility] [phone]     Please call the care guide team at [phone] if you need to cancel or reschedule your appointment.   Print copy of patient instructions, educational materials, and care plan provided in person.

## 2020-12-14 NOTE — Chronic Care Management (AMB) (Signed)
Chronic Care Management CCM Pharmacy Note  12/14/2020 Name:  Shane Jones MRN:  [id number] DOB:  Nov 16, 1945  Summary: - Re-educated on [place] 2 application  - Due to reapply for 2023 assistance  Recommendations/Changes made from today's visit: - Continue current regimen at this time  Subjective: Shane Jones is an 75 y.o. year old male who is a primary patient of Caryl Bis, Angela Adam, MD.  The CCM team was consulted for assistance with disease management and care coordination needs.    Engaged with patient face to face for follow up visit for pharmacy case management and/or care coordination services.   Objective:  Medications Reviewed Today     Reviewed by Doreen Beam, FNP (Family Nurse Practitioner) on 11/15/20 at 1132  Med List Status: <None>   Medication Order Taking? Sig Documenting Provider Last Dose Status Informant  Accu-Chek FastClix Lancets MISC [id number] Yes USE UP TO FOUR TIMES DAILY AS DIRECTED Leone Haven, MD Taking Active   ACCU-CHEK GUIDE test strip [id number] Yes USE UP TO FOUR TIMES DAILY AS DIRECTED Leone Haven, MD Taking Active   allopurinol (ZYLOPRIM) 100 MG tablet [id number] Yes TAKE 1 TABLET DAILY Leone Haven, MD Taking Active   amLODipine (NORVASC) 10 MG tablet [id number] Yes TAKE 1 TABLET EVERY DAY Leone Haven, MD Taking Active   aspirin 325 MG tablet [id number] Yes Take 325 mg by mouth daily. [provider] Taking Active   atorvastatin (LIPITOR) 80 MG tablet [id number] Yes TAKE 1 TABLET EVERY DAY Leone Haven, MD Taking Active   B Complex Vitamins (VITAMIN-B COMPLEX PO) [id number] Yes Take by mouth. [provider] Taking Active   BETA CAROTENE PO [id number] Yes Take by mouth. [provider] Taking Active   blood glucose meter kit and supplies [id number] Yes Dispense based on patient and insurance preference. Use up to four times daily as directed. (FOR ICD-10 E10.9, E11.9). Leone Haven,  MD Taking Active   Calcium Carbonate-Vitamin D (CALCIUM + D PO) [id number] Yes Take by mouth. [provider] Taking Active   Continuous Blood Gluc Sensor (FREESTYLE LIBRE 2 SENSOR) [place] [id number] Yes by Does not apply route. [provider] Taking Active   doxazosin (CARDURA) 4 MG tablet [id number] Yes Take 4 mg by mouth daily. [provider] Taking Active   Dulaglutide (TRULICITY) 4.5 OK/5.9XH SOPN [id number] Yes Inject 4.5 mg as directed once a week. Leone Haven, MD Taking Active            Med Note De Hollingshead   Tue Jun 15, 2020  4:31 PM)    empagliflozin (JARDIANCE) 25 MG TABS tablet [id number] Yes Take 25 mg by mouth daily. Leone Haven, MD Taking Active   escitalopram (LEXAPRO) 20 MG tablet [id number] Yes TAKE 1 TABLET (20 MG) BY MOUTH DAILY Leone Haven, MD Taking Active   ezetimibe (ZETIA) 10 MG tablet [id number] Yes Take 1 tablet (10 mg total) by mouth daily. Leone Haven, MD Taking Active   furosemide (LASIX) 20 MG tablet [id number] Yes Take one tablet (20 mg total) by mouth daily. Leone Haven, MD Taking Active   gabapentin (NEURONTIN) 400 MG capsule [id number] Yes Take 1 capsule (400 mg total) by mouth 3 (three) times daily. Leone Haven, MD Taking Active            Med Note Mayo Ao Sep 13, 2020  4:26 PM) Taking PRN  hydrALAZINE (APRESOLINE) 10  MG tablet [id number] Yes TAKE 1 TABLET(10 MG) BY MOUTH IN THE MORNING AND AT BEDTIME Leone Haven, MD Taking Active   Insulin Glargine [facility] KWIKPEN) 100 UNIT/ML [id number] Yes Inject 26 Units into the skin daily. Leone Haven, MD Taking Active   insulin lispro (HUMALOG KWIKPEN) 100 UNIT/ML KwikPen [id number] Yes Inject 20-22 Units into the skin 2 (two) times daily with a meal. Leone Haven, MD Taking Active   Insulin Pen Needle (PEN NEEDLES) 32G X 4 MM MISC [id number] Yes Use to inject insulin up to 4 times daily Leone Haven, MD Taking  Active   losartan (COZAAR) 100 MG tablet [id number] Yes TAKE 1 TABLET EVERY DAY Leone Haven, MD Taking Active   magnesium 30 MG tablet [id number] Yes Take 30 mg by mouth 2 (two) times daily. [provider] Taking Active   metFORMIN (GLUCOPHAGE XR) 500 MG 24 hr tablet [id number] Yes Take 1 tablet (500 mg total) by mouth in the morning and at bedtime. Leone Haven, MD Taking Active   metoprolol succinate (TOPROL-XL) 100 MG 24 hr tablet [id number] Yes TAKE 1 TABLET EVERY DAY Leone Haven, MD Taking Active   Multiple Vitamin (MULTIVITAMIN) tablet [id number] Yes Take 1 tablet by mouth daily. [provider] Taking Active   pantoprazole (PROTONIX) 40 MG tablet [id number] Yes Take 1 tablet (40 mg total) by mouth daily. Leone Haven, MD Taking Active   potassium chloride (KLOR-CON) 10 MEQ tablet [id number] Yes TAKE 1 TABLET BY MOUTH DAILY WITH LASIX Leone Haven, MD Taking Active   potassium chloride SA (KLOR-CON) 20 MEQ tablet [id number]  Take 2 tablets (40 mEq total) by mouth daily for 3 days. Leone Haven, MD  Expired 10/03/20 2359   sildenafil (VIAGRA) 50 MG tablet [id number] Yes Take 1 tablet (50 mg total) by mouth daily as needed for erectile dysfunction. Leone Haven, MD Taking Active   Zinc Sulfate (ZINC 15 PO) [id number] Yes Take by mouth. [provider] Taking Active             Pertinent Labs:  Lab Results  Component Value Date   HGBA1C 7.3 (H) 09/29/2020   Lab Results  Component Value Date   CHOL 107 09/29/2020   HDL 31.90 (L) 09/29/2020   LDLCALC 23 08/07/2016   LDLDIRECT 53.0 09/29/2020   TRIG 208.0 (H) 09/29/2020   CHOLHDL 3 09/29/2020   Lab Results  Component Value Date   CREATININE 1.82 (H) 11/15/2020   BUN 29 (H) 11/15/2020   NA 139 11/15/2020   K 3.4 (L) 11/15/2020   CL 100 11/15/2020   CO2 29 11/15/2020    SDOH:  (Social Determinants of Health) assessments and interventions performed:  SDOH  Interventions    Flowsheet Row Most Recent Value  SDOH Interventions   Financial Strain Interventions Other (Comment)  [manufacturer assistance]       CCM Care Plan  Review of patient past medical history, allergies, medications, health status, including review of consultants reports, laboratory and other test data, was performed as part of comprehensive evaluation and provision of chronic care management services.   Care Plan : Medication Management  Updates made by De Hollingshead, RPH-CPP since 12/14/2020 12:00 AM     Problem: Diabetes, Hypertension, CKD, Depression      Long-Range Goal: Disease Progression Prevention   Recent Progress: On track  Priority: High  Note:   Current Barriers:  Unable to independently afford treatment regimen Unable to independently monitor  therapeutic efficacy Unable to achieve control of diabetes   Pharmacist Clinical Goal(s):  Over the next 90 days, patient will verbalize ability to afford treatment regimen. Over the next 90 days, patient will achieve control of diabetes as evidenced by improvement in A1c through collaboration with PharmD and provider.   Interventions: 1:1 collaboration with Leone Haven, MD regarding development and update of comprehensive plan of care as evidenced by provider attestation and co-signature Inter-disciplinary care team collaboration (see longitudinal plan of care) Comprehensive medication review performed; medication list updated in electronic medical record  Diabetes: Controlled at relaxed goal of A1c <7.5%; current treatment: metformin XR 1000 mg BID, Jardiance 25 mg daily, Trulicity 4.5 mg; Basaglar 26 units once daily per his report, Humalog 12-16 units with meals. He cannot verbalize a specific sliding scale that he uses.  Using [place] 2 CGM. Has been unable to get a Libre sensor started since our last visit.  Observed patient as he was applying sensor. Identified that he was twisting the  sensor/applicator when he shouldn't be, which may have been messing up sensor. Advised on method of application. He appropriately placed a sensor today in office. Provided written information for how to use Libre 2 and how to contact the help desk.  Advised to start rechecking glucose via CGM. Monitor for elevations with prior need to reduce metformin dose due to renal function. Consider insulin titration as needed Will collaborate w/ CPhT, patient, and providers to reapply for patient assistance for Trulicity, Basaglar, Humalog, Jardiance for 2023.  Hypertension: Controlled per last clinic reading. current treatment: furosemide 20 mg QAM, losartan 100 mg QAM, metoprolol XL 100 mg QAM, amlodipine 10 mg daily QAM, doxazosin 4 mg QPM; hydralazine 10 mg BID Previously recommended to continue current regimen at this time.   Hyperlipidemia: Controlled; current treatment: atorvastatin 80 mg, ezetimibe 10 mg  Antiplatelet regimen: aspirin 325 mg daily (hx CVA per chart review) Previously recommended to continue current regimen at this time. Lipids pending.   Depression: Controlled per patient report; current treatment: escitalopram 20 mg daily Previously recommended to continue current regimen at this time.   Peripheral Neuropathy: Improved per patient report; current regimen: gabapentin 400 mg TID - though he is taking PRN Previously recommended to continue current regimen at this time. Follow repeat BMP and adjust gabapentin as needed.   Gout: Controlled per patient report; current regimen: allopurinol 100 mg daily  Last uric acid WNL Previously recommended to continue current regimen at this time  GERD: Moderately well controlled per patient report; current regimen: pantoprazole 40 mg daily Reports GERD symptoms, but he is not necessarily taking before meals. Advised to take about 30 minutes prior to meals Previously recommended to continue current regimen at this time  Patient  Goals/Self-Care Activities Over the next 90 days, patient will:  - take medications as prescribed check glucose three times daily using CGM, document, and provide at future appointments check blood pressure periodically, document, and provide at future appointments collaborate with provider on medication access solutions      Plan: Telephone follow up appointment with care management team member scheduled for:  8 weeks  Catie Darnelle Maffucci, PharmD, [place], [place] Pharmacist [employer] at [employer] [phone]

## 2020-12-16 ENCOUNTER — Telehealth: Payer: Self-pay | Admitting: Pharmacy Technician

## 2020-12-16 DIAGNOSIS — Z596 Low income: Secondary | ICD-10-CM

## 2020-12-16 NOTE — Progress Notes (Signed)
[facility])                                            IXL Team    12/16/2020  Shane Jones September 07, 1945 [id number]                                      Medication Assistance Referral  Referral From: [facility] Embedded RPh Catie T.   Medication/Company: Danelle Berry / [employer] Patient application portion: Interoffice Mailed Provider application portion: Interoffice Mailed to Dr. Caryl Bis Provider address/fax verified via: Office website  Medication/Company: [name] / [employer] Patient application portion: Interoffice Mailed Provider application portion: Interoffice Mailed to Dr. Caryl Bis Provider address/fax verified via: Office website  Medication/Company: Humalog / [employer] Patient application portion: Gaffer portion: Interoffice Mailed to Dr. Caryl Bis Provider address/fax verified via: Office website  Medication/Company: [name] Patient application portion: Interoffice Mailed Provider application portion: Interoffice Mailed to Dr. Caryl Bis Provider address/fax verified via: Office website   [name] P. Lemmie Vanlanen, [place]  [phone]

## 2020-12-20 ENCOUNTER — Telehealth: Payer: Self-pay | Admitting: Pharmacy Technician

## 2020-12-20 DIAGNOSIS — Z596 Low income: Secondary | ICD-10-CM

## 2020-12-20 NOTE — Progress Notes (Signed)
[facility])                                            [place] Team    12/20/2020  Shane Jones [birth year] [id number]  Received both patient and provider portion(s) of patient assistance application(s) for Jardiance and [employer], Humalog. Faxed completed application and required documents into [employer] and the other 3 to [employer].    Markel Kurtenbach P. Talaya Lamprecht, [place]  [phone]

## 2020-12-21 NOTE — Telephone Encounter (Signed)
Pt was advised to give [name] a call if he needed assistance with his finances. Pt would like information on how to go about receiving this assistance.

## 2020-12-21 NOTE — Addendum Note (Signed)
Addended by: De Hollingshead on: 12/21/2020 02:47 PM   Modules accepted: Orders

## 2020-12-21 NOTE — Telephone Encounter (Signed)
Spoke with patient. Reports financial struggles right now. Can't pay heating bill, worried about upcoming cold snap. Also notes issues with utilities, food. Placing urgent Care Guide referral given heating needs in light of upcoming weather situatoin

## 2020-12-22 ENCOUNTER — Telehealth: Payer: Self-pay | Admitting: *Deleted

## 2020-12-22 NOTE — Telephone Encounter (Signed)
° °  Telephone encounter was:  Unsuccessful.  12/22/2020 Name: Shane Jones MRN: [id number] DOB: [birth year]  Unsuccessful outbound call made today to assist with:  patients mailbox is full and will not accept messages at this time  Outreach Attempt:  1st Attempt   patients mailbox is full and will not accept messages at this time  [place], Care Management  [phone] [address] , [place] [address] Email : [email or web address]

## 2020-12-28 ENCOUNTER — Telehealth: Payer: Self-pay | Admitting: *Deleted

## 2020-12-28 NOTE — Telephone Encounter (Signed)
° °  Telephone encounter was:  Unsuccessful.  12/28/2020 Name: Shane Jones MRN: [id number] DOB: 08-Nov-1945  Unsuccessful outbound call made today to assist with:  Transportation Needs  and Food Insecurity  Outreach Attempt:  2nd Attempt  A HIPAA compliant voice message was left requesting a return call.  Instructed patient to call back at   Instructed patient to call back at [phone]  at their earliest convenience. .  [place], Care Management  [phone] [address] , [place] [address] Email : [email or web address]

## 2020-12-29 ENCOUNTER — Telehealth: Payer: Self-pay | Admitting: *Deleted

## 2020-12-29 NOTE — Telephone Encounter (Signed)
° °  Telephone encounter was:  Unsuccessful.  12/29/2020 Name: DONTAI PEMBER MRN: [id number] DOB: 1945-10-15  Unsuccessful outbound call made today to assist with:  Transportation Needs  and Food Insecurity  Outreach Attempt:  3rd Attempt.  Referral closed unable to contact patient. .Mailbox is full and can accept no messages at this time   [place], Care Management  [phone] [address] [place] , [place] [address] Email : [email or web address]

## 2020-12-30 ENCOUNTER — Telehealth: Payer: Self-pay | Admitting: Family Medicine

## 2020-12-30 ENCOUNTER — Ambulatory Visit (INDEPENDENT_AMBULATORY_CARE_PROVIDER_SITE_OTHER): Payer: Medicare Other | Admitting: Pharmacist

## 2020-12-30 DIAGNOSIS — E1165 Type 2 diabetes mellitus with hyperglycemia: Secondary | ICD-10-CM

## 2020-12-30 DIAGNOSIS — F320 Major depressive disorder, single episode, mild: Secondary | ICD-10-CM

## 2020-12-30 DIAGNOSIS — N1831 Chronic kidney disease, stage 3a: Secondary | ICD-10-CM

## 2020-12-30 NOTE — Telephone Encounter (Signed)
Patient is on his way to office to get help with his Elenor Legato

## 2020-12-30 NOTE — Patient Instructions (Addendum)
Shane Jones,   I am going to ask a therapist, Marcie Bal, to give you a call in the next few business days.   If you are experiencing a Mental Health or [place] or need someone to talk to, please call the Suicide and Crisis Lifeline: 988 call 1-800-273-TALK (toll free, 24 hour hotline)   I need you to call one of the following groups if you start feeling worse.   Take care,   Catie Darnelle Maffucci, PharmD  Visit Information  Following are the goals we discussed today:  Patient Goals/Self-Care Activities Over the next 90 days, patient will:  - take medications as prescribed check glucose three times daily using CGM, document, and provide at future appointments check blood pressure periodically, document, and provide at future appointments collaborate with provider on medication access solutions        Plan: Telephone follow up appointment with care management team member scheduled for:  3 months   Catie Darnelle Maffucci, PharmD, [place], CPP Clinical Pharmacist [place] at [facility] [phone]   Please call the care guide team at [phone] if you need to cancel or reschedule your appointment.   Patient verbalizes understanding of instructions provided today and agrees to view in [place].

## 2020-12-30 NOTE — Chronic Care Management (AMB) (Signed)
Chronic Care Management CCM Pharmacy Note  12/30/2020 Name:  Shane Jones MRN:  [id number] DOB:  1945/03/16  Summary: - Presents today for [facility] teaching  Recommendations/Changes made from today's visit: - Referral to LCSW for anxiety and worsening depression. No acute concerns today for patient's safety to warrant immediate escalation  Subjective: Shane Jones is an 75 y.o. year old male who is a primary patient of Caryl Bis, Angela Adam, MD.  The CCM team was consulted for assistance with disease management and care coordination needs.    Engaged with patient face to face for follow up visit for pharmacy case management and/or care coordination services.   Objective:  Medications Reviewed Today     Reviewed by Doreen Beam, FNP (Family Nurse Practitioner) on 11/15/20 at 1132  Med List Status: <None>   Medication Order Taking? Sig Documenting Provider Last Dose Status Informant  Accu-Chek FastClix Lancets MISC [id number] Yes USE UP TO FOUR TIMES DAILY AS DIRECTED Leone Haven, MD Taking Active   ACCU-CHEK GUIDE test strip [id number] Yes USE UP TO FOUR TIMES DAILY AS DIRECTED Leone Haven, MD Taking Active   allopurinol (ZYLOPRIM) 100 MG tablet [id number] Yes TAKE 1 TABLET DAILY Leone Haven, MD Taking Active   amLODipine (NORVASC) 10 MG tablet [id number] Yes TAKE 1 TABLET EVERY DAY Leone Haven, MD Taking Active   aspirin 325 MG tablet [id number] Yes Take 325 mg by mouth daily. [provider] Taking Active   atorvastatin (LIPITOR) 80 MG tablet [id number] Yes TAKE 1 TABLET EVERY DAY Leone Haven, MD Taking Active   B Complex Vitamins (VITAMIN-B COMPLEX PO) [id number] Yes Take by mouth. [provider] Taking Active   BETA CAROTENE PO [id number] Yes Take by mouth. [provider] Taking Active   blood glucose meter kit and supplies [id number] Yes Dispense based on patient and insurance preference. Use up to four times daily as  directed. (FOR ICD-10 E10.9, E11.9). Leone Haven, MD Taking Active   Calcium Carbonate-Vitamin D (CALCIUM + D PO) [id number] Yes Take by mouth. [provider] Taking Active   Continuous Blood Gluc Sensor (FREESTYLE LIBRE 2 SENSOR) [place] [id number] Yes by Does not apply route. [provider] Taking Active   doxazosin (CARDURA) 4 MG tablet [id number] Yes Take 4 mg by mouth daily. [provider] Taking Active   Dulaglutide (TRULICITY) 4.5 QQ/7.6PP SOPN [id number] Yes Inject 4.5 mg as directed once a week. Leone Haven, MD Taking Active            Med Note De Hollingshead   Tue Jun 15, 2020  4:31 PM)    empagliflozin (JARDIANCE) 25 MG TABS tablet [id number] Yes Take 25 mg by mouth daily. Leone Haven, MD Taking Active   escitalopram (LEXAPRO) 20 MG tablet [id number] Yes TAKE 1 TABLET (20 MG) BY MOUTH DAILY Leone Haven, MD Taking Active   ezetimibe (ZETIA) 10 MG tablet [id number] Yes Take 1 tablet (10 mg total) by mouth daily. Leone Haven, MD Taking Active   furosemide (LASIX) 20 MG tablet [id number] Yes Take one tablet (20 mg total) by mouth daily. Leone Haven, MD Taking Active   gabapentin (NEURONTIN) 400 MG capsule [id number] Yes Take 1 capsule (400 mg total) by mouth 3 (three) times daily. Leone Haven, MD Taking Active            Med Note Mayo Ao Sep 13, 2020  4:26 PM) Taking  PRN  hydrALAZINE (APRESOLINE) 10 MG tablet [id number] Yes TAKE 1 TABLET(10 MG) BY MOUTH IN THE MORNING AND AT BEDTIME Leone Haven, MD Taking Active   Insulin Glargine [facility] KWIKPEN) 100 UNIT/ML [id number] Yes Inject 26 Units into the skin daily. Leone Haven, MD Taking Active   insulin lispro (HUMALOG KWIKPEN) 100 UNIT/ML KwikPen [id number] Yes Inject 20-22 Units into the skin 2 (two) times daily with a meal. Leone Haven, MD Taking Active   Insulin Pen Needle (PEN NEEDLES) 32G X 4 MM MISC [id number] Yes Use to inject  insulin up to 4 times daily Leone Haven, MD Taking Active   losartan (COZAAR) 100 MG tablet [id number] Yes TAKE 1 TABLET EVERY DAY Leone Haven, MD Taking Active   magnesium 30 MG tablet [id number] Yes Take 30 mg by mouth 2 (two) times daily. [provider] Taking Active   metFORMIN (GLUCOPHAGE XR) 500 MG 24 hr tablet [id number] Yes Take 1 tablet (500 mg total) by mouth in the morning and at bedtime. Leone Haven, MD Taking Active   metoprolol succinate (TOPROL-XL) 100 MG 24 hr tablet [id number] Yes TAKE 1 TABLET EVERY DAY Leone Haven, MD Taking Active   Multiple Vitamin (MULTIVITAMIN) tablet [id number] Yes Take 1 tablet by mouth daily. [provider] Taking Active   pantoprazole (PROTONIX) 40 MG tablet [id number] Yes Take 1 tablet (40 mg total) by mouth daily. Leone Haven, MD Taking Active   potassium chloride (KLOR-CON) 10 MEQ tablet [id number] Yes TAKE 1 TABLET BY MOUTH DAILY WITH LASIX Leone Haven, MD Taking Active   potassium chloride SA (KLOR-CON) 20 MEQ tablet [id number]  Take 2 tablets (40 mEq total) by mouth daily for 3 days. Leone Haven, MD  Expired 10/03/20 2359   sildenafil (VIAGRA) 50 MG tablet [id number] Yes Take 1 tablet (50 mg total) by mouth daily as needed for erectile dysfunction. Leone Haven, MD Taking Active   Zinc Sulfate (ZINC 15 PO) [id number] Yes Take by mouth. [provider] Taking Active             Pertinent Labs:   Lab Results  Component Value Date   HGBA1C 7.3 (H) 09/29/2020   Lab Results  Component Value Date   CHOL 107 09/29/2020   HDL 31.90 (L) 09/29/2020   LDLCALC 23 08/07/2016   LDLDIRECT 53.0 09/29/2020   TRIG 208.0 (H) 09/29/2020   CHOLHDL 3 09/29/2020   Lab Results  Component Value Date   CREATININE 1.82 (H) 11/15/2020   BUN 29 (H) 11/15/2020   NA 139 11/15/2020   K 3.4 (L) 11/15/2020   CL 100 11/15/2020   CO2 29 11/15/2020    SDOH:  (Social Determinants of Health)  assessments and interventions performed:  SDOH Interventions    Flowsheet Row Most Recent Value  SDOH Interventions   SDOH Interventions for the Following Domains Depression, Stress  Stress Interventions Provide Counseling, Other (Comment)  [LCSW]  Depression Interventions/Treatment  Counseling  [referral to LCSQ]       CCM Care Plan  Review of patient past medical history, allergies, medications, health status, including review of consultants reports, laboratory and other test data, was performed as part of comprehensive evaluation and provision of chronic care management services.   Care Plan : Medication Management  Updates made by De Hollingshead, RPH-CPP since 12/30/2020 12:00 AM     Problem: Diabetes, Hypertension, CKD, Depression      Long-Range Goal: Disease Progression Prevention  Recent Progress: On track  Priority: High  Note:   Current Barriers:  Unable to independently afford treatment regimen Unable to independently monitor therapeutic efficacy Unable to achieve control of diabetes   Pharmacist Clinical Goal(s):  Over the next 90 days, patient will verbalize ability to afford treatment regimen. Over the next 90 days, patient will achieve control of diabetes as evidenced by improvement in A1c through collaboration with PharmD and provider.   Interventions: 1:1 collaboration with Leone Haven, MD regarding development and update of comprehensive plan of care as evidenced by provider attestation and co-signature Inter-disciplinary care team collaboration (see longitudinal plan of care) Comprehensive medication review performed; medication list updated in electronic medical record  Diabetes: Controlled at relaxed goal of A1c <7.5%; current treatment: metformin XR 1000 mg BID, Jardiance 25 mg daily, Trulicity 4.5 mg; Basaglar 26 units once daily per his report, Humalog 12-16 units with meals. He cannot verbalize a specific sliding scale that he uses.   Patient presents to clinic today tearful and frustrated with being unable to get [employer] sensor started. He is tearfully anxious today. Assisted in setting up sensor, retaught how to place.   Hypertension: Controlled per last clinic reading. current treatment: furosemide 20 mg QAM, losartan 100 mg QAM, metoprolol XL 100 mg QAM, amlodipine 10 mg daily QAM, doxazosin 4 mg QPM; hydralazine 10 mg BID Previously recommended to continue current regimen at this time.   Hyperlipidemia: Controlled; current treatment: atorvastatin 80 mg, ezetimibe 10 mg  Antiplatelet regimen: aspirin 325 mg daily (hx CVA per chart review) Previously recommended to continue current regimen at this time. Lipids pending.   Depression: Uncontrolled; current treatment: escitalopram 20 mg daily;  Brother died a few weeks ago. Reports he got into an argument with his son. Anxious since, unable to sleep without his light on, so is not sleeping well. Denies SI/HI. Referring to LCSW. Provided immediate crisis line information   Peripheral Neuropathy: Improved per patient report; current regimen: gabapentin 400 mg TID - though he is taking PRN Previously recommended to continue current regimen at this time. Follow repeat BMP and adjust gabapentin as needed.   Gout: Controlled per patient report; current regimen: allopurinol 100 mg daily  Last uric acid WNL Previously recommended to continue current regimen at this time  GERD: Moderately well controlled per patient report; current regimen: pantoprazole 40 mg daily Reports GERD symptoms, but he is not necessarily taking before meals. Advised to take about 30 minutes prior to meals Previously recommended to continue current regimen at this time  Patient Goals/Self-Care Activities Over the next 90 days, patient will:  - take medications as prescribed check glucose three times daily using CGM, document, and provide at future appointments check blood pressure periodically,  document, and provide at future appointments collaborate with provider on medication access solutions      Plan: Telephone follow up appointment with care management team member scheduled for:  3 months  Catie Darnelle Maffucci, PharmD, [place], [place] Pharmacist [employer] at [employer] [phone]

## 2020-12-30 NOTE — Telephone Encounter (Signed)
Saw patient. See CCM documentation

## 2020-12-31 ENCOUNTER — Ambulatory Visit: Payer: Self-pay | Admitting: *Deleted

## 2020-12-31 ENCOUNTER — Telehealth: Payer: Self-pay | Admitting: Pharmacist

## 2020-12-31 DIAGNOSIS — E1165 Type 2 diabetes mellitus with hyperglycemia: Secondary | ICD-10-CM

## 2020-12-31 DIAGNOSIS — F320 Major depressive disorder, single episode, mild: Secondary | ICD-10-CM

## 2020-12-31 DIAGNOSIS — F32A Depression, unspecified: Secondary | ICD-10-CM

## 2020-12-31 NOTE — Chronic Care Management (AMB) (Addendum)
Chronic Care Management    Clinical Social Work Note  01/01/2021 Name: Shane Jones MRN: [id number] DOB: 07-16-1945  MANSOOR HILLYARD is a 75 y.o. year old male who is a primary care patient of Caryl Bis, Angela Adam, MD. The CCM team was consulted to assist the patient with chronic disease management and/or care coordination needs related to: [employer] , Mental Health Counseling and Resources, and Grief Counseling.   Engaged with patient by telephone for initial visit in response to provider referral for social work chronic care management and care coordination services.   Consent to Services:  The patient was given information about Chronic Care Management services, agreed to services, and gave verbal consent prior to initiation of services.  Please see initial visit note for detailed documentation.   Patient agreed to services and consent obtained.   Assessment: Review of patient past medical history, allergies, medications, and health status, including review of relevant consultants reports was performed today as part of a comprehensive evaluation and provision of chronic care management and care coordination services.     SDOH (Social Determinants of Health) assessments and interventions performed:  SDOH Interventions    Flowsheet Row Most Recent Value  SDOH Interventions   Depression Interventions/Treatment  Medication, Counseling        Advanced Directives Status: Not addressed in this encounter.  CCM Care Plan  Allergies  Allergen Reactions   Uloric [Febuxostat] Hives    hives    Outpatient Encounter Medications as of 12/31/2020  Medication Sig Note   Accu-Chek FastClix Lancets MISC USE UP TO FOUR TIMES DAILY AS DIRECTED    ACCU-CHEK GUIDE test strip USE UP TO FOUR TIMES DAILY AS DIRECTED    allopurinol (ZYLOPRIM) 100 MG tablet TAKE 1 TABLET DAILY    amLODipine (NORVASC) 10 MG tablet TAKE 1 TABLET EVERY DAY    aspirin 325 MG tablet Take 325 mg by mouth daily.     atorvastatin (LIPITOR) 80 MG tablet TAKE 1 TABLET EVERY DAY    B Complex Vitamins (VITAMIN-B COMPLEX PO) Take by mouth.    BETA CAROTENE PO Take by mouth.    blood glucose meter kit and supplies Dispense based on patient and insurance preference. Use up to four times daily as directed. (FOR ICD-10 E10.9, E11.9).    Calcium Carbonate-Vitamin D (CALCIUM + D PO) Take by mouth.    ciprofloxacin (CIPRO) 500 MG tablet Take 1 tablet (500 mg total) by mouth 2 (two) times daily.    Continuous Blood Gluc Sensor (FREESTYLE LIBRE 2 SENSOR) MISC by Does not apply route.    doxazosin (CARDURA) 4 MG tablet Take 4 mg by mouth daily.    Dulaglutide (TRULICITY) 4.5 JJ/0.0XF SOPN Inject 4.5 mg as directed once a week.    empagliflozin (JARDIANCE) 25 MG TABS tablet Take 25 mg by mouth daily.    escitalopram (LEXAPRO) 20 MG tablet TAKE 1 TABLET (20 MG) BY MOUTH DAILY    ezetimibe (ZETIA) 10 MG tablet Take 1 tablet (10 mg total) by mouth daily.    furosemide (LASIX) 20 MG tablet Take one tablet (20 mg total) by mouth daily.    gabapentin (NEURONTIN) 400 MG capsule Take 1 capsule (400 mg total) by mouth 3 (three) times daily. 09/13/2020: Taking PRN   hydrALAZINE (APRESOLINE) 10 MG tablet TAKE 1 TABLET(10 MG) BY MOUTH IN THE MORNING AND AT BEDTIME    Insulin Glargine (BASAGLAR KWIKPEN) 100 UNIT/ML Inject 26 Units into the skin daily.    insulin  lispro (HUMALOG KWIKPEN) 100 UNIT/ML KwikPen Inject 20-22 Units into the skin 2 (two) times daily with a meal.    Insulin Pen Needle (PEN NEEDLES) 32G X 4 MM MISC Use to inject insulin up to 4 times daily    losartan (COZAAR) 100 MG tablet TAKE 1 TABLET EVERY DAY    magnesium 30 MG tablet Take 30 mg by mouth 2 (two) times daily.    metFORMIN (GLUCOPHAGE XR) 500 MG 24 hr tablet Take 1 tablet (500 mg total) by mouth in the morning and at bedtime.    metoprolol succinate (TOPROL-XL) 100 MG 24 hr tablet TAKE 1 TABLET EVERY DAY    Multiple Vitamin (MULTIVITAMIN) tablet Take 1 tablet  by mouth daily.    pantoprazole (PROTONIX) 40 MG tablet Take 1 tablet (40 mg total) by mouth daily.    potassium chloride SA (KLOR-CON) 20 MEQ tablet Take 2 tablets (40 mEq total) by mouth daily for 3 days.    sildenafil (VIAGRA) 50 MG tablet Take 1 tablet (50 mg total) by mouth daily as needed for erectile dysfunction.    Zinc Sulfate (ZINC 15 PO) Take by mouth.    No facility-administered encounter medications on file as of 12/31/2020.    Patient Active Problem List   Diagnosis Date Noted   Urinary urgency 11/15/2020   History of hypokalemia 11/15/2020   Erectile dysfunction 06/29/2020   Tick bite of foot 06/29/2020   Headache 05/13/2020   Overweight 05/30/2019   Benign hypertensive kidney disease with chronic kidney disease 01/22/2019   Secondary hyperparathyroidism of renal origin ([place]) 01/22/2019   Stage 3a chronic kidney disease ([place]) 01/22/2019   Lightheadedness 12/19/2018   Skin lesions 01/21/2018   Esophagitis    Proteinuria 10/10/2017   Onychomycosis 03/19/2017   Anxiety and depression 09/19/2016   Hypokalemia 09/19/2016   Loss of height 05/01/2016   Left shoulder pain 01/31/2016   Sleep apnea 11/22/2011   Chronic back pain 08/17/2011   Hyperlipidemia 08/17/2011   Gout 08/17/2011   T2DM (type 2 diabetes mellitus) ([place]) 02/20/2011   Neuropathy ([place]) 02/17/2011   Hypertension 10/13/2010   This covering CSW was asked urgently to outreach pt regarding mental health concerns. Pt was emotional at the beginning of our conversation; feeling bad for calling me,  "sugar". "I did not mean anything bad by it..." CSW reassured pt no harm and no concerns if he wants to call me that. CSW advised pt the reason for call; concerns related to yesterdays visit at PCP office with [facility], Catie Darnelle Maffucci. Pt reports he was upset with his son and an argument they had. 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  Pt initially denies depression or anxiety but then admits "maybe I do and it's worse  since I lost my brother Nov 12th".CSW validated his emotions; grief and loss, and how this can worsen depression symptoms. Pt would like to "think about it" when asked if he would consider counseling support- he is also advised Hospice programs may be helpful- will mail info to him on this.  Pt denies SI/HI. CSW also provided pt with the "988" number to call if mental health needs arise to call.  He reports having a "good Christmas with family" and admits to having "waves of this". CSW reassured him it was normal for those dealing with grief as well as isolation- pt admits to feeling lonely and would maybe enjoy more socialization- suggested he consider the Hospice group as well as volunteering, etc.  Pt has "  Shadow" as his pet/companion which brings him happiness.  Pt agrees to think about the counseling and expect a follow up call from the assigned CSW to further discuss and support him.  Conditions to be addressed/monitored: DMII, Anxiety, and Depression; Limited social support, Mental Health Concerns , Family and relationship dysfunction, and Lacks knowledge of community resource:        Follow Up Plan: SW will follow up with patient by phone over the next 2-3 weeks      . Eduard Clos MSW, LCSW Licensed Holiday representative

## 2020-12-31 NOTE — Telephone Encounter (Signed)
Patient returned call. Provided phone number to LCSW

## 2020-12-31 NOTE — Telephone Encounter (Signed)
Attempted to call patient. Call went to voicemail and voicemail is not set up.   Called patient's Emergency Contact, Pete. Asked to ask patient to call me back at his convenience about a medication question

## 2020-12-31 NOTE — Patient Instructions (Signed)
Visit Information  Thank you for taking time to visit with me today. Please don't hesitate to contact me if I can be of assistance to you before our next scheduled telephone appointment.  Following are the goals we discussed today:  - avoid negative self-talk - develop a personal safety plan - develop a plan to deal with triggers like holidays, anniversaries - have a plan for how to handle bad days - spend time or talk with others at least 2 to 3 times per week - spend time or talk with others every day - watch for early signs of feeling worse -consider counseling as well as Hospice (free) program       Our next appointment is by telephone  (date to be determined)  Please call the care guide team at [phone] if you need to cancel or reschedule your appointment.   If you are experiencing a Mental Health or [place] or need someone to talk to, please call the Suicide and Crisis Lifeline: 988 call the [place] National Suicide Prevention Lifeline: 438-377-9476 or TTY: 586-827-0065 TTY 843-319-7403) to talk to a trained counselor go to [facility] [address], [place] [phone]) call the [place]: [phone] call 911   The patient verbalized understanding of instructions, educational materials, and care plan provided today and declined offer to receive copy of patient instructions, educational materials, and care plan.  Eduard Clos MSW, LCSW Licensed Holiday representative

## 2021-01-01 DIAGNOSIS — Z7984 Long term (current) use of oral hypoglycemic drugs: Secondary | ICD-10-CM | POA: Diagnosis not present

## 2021-01-01 DIAGNOSIS — Z794 Long term (current) use of insulin: Secondary | ICD-10-CM | POA: Diagnosis not present

## 2021-01-01 DIAGNOSIS — E1140 Type 2 diabetes mellitus with diabetic neuropathy, unspecified: Secondary | ICD-10-CM | POA: Diagnosis not present

## 2021-01-01 DIAGNOSIS — E1159 Type 2 diabetes mellitus with other circulatory complications: Secondary | ICD-10-CM | POA: Diagnosis not present

## 2021-01-01 DIAGNOSIS — E785 Hyperlipidemia, unspecified: Secondary | ICD-10-CM | POA: Diagnosis not present

## 2021-01-01 DIAGNOSIS — F32A Depression, unspecified: Secondary | ICD-10-CM | POA: Diagnosis not present

## 2021-01-01 DIAGNOSIS — I10 Essential (primary) hypertension: Secondary | ICD-10-CM

## 2021-01-10 ENCOUNTER — Telehealth: Payer: Self-pay | Admitting: *Deleted

## 2021-01-10 NOTE — Chronic Care Management (AMB) (Signed)
°  Care Management   Note  01/10/2021 Name: Shane Jones MRN: [id number] DOB: 12-Dec-1945  Shane Jones is a 76 y.o. year old male who is a primary care patient of Leone Haven, MD and is actively engaged with the care management team. I reached out to Irean Hong by phone today to assist with scheduling a follow up visit with the Licensed Clinical Social Worker  Follow up plan: Unsuccessful telephone outreach attempt made. A HIPAA compliant phone message was left for the patient providing contact information and requesting a return call.  The care management team will reach out to the patient again over the next 7 days.  If patient returns call to provider office, please advise to call [place] at [phone].  [place] Management  Direct Dial: [phone]

## 2021-01-18 NOTE — Chronic Care Management (AMB) (Signed)
°  Care Management   Note  01/18/2021 Name: Shane Jones MRN: [id number] DOB: [birth year]  Shane Jones is a 76 y.o. year old male who is a primary care patient of Leone Haven, MD and is actively engaged with the care management team. I reached out to Irean Hong by phone today to assist with scheduling a follow up visit with the Licensed Clinical Social Worker  Follow up plan: Unsuccessful telephone outreach attempt made. The care management team will reach out to the patient again over the next 7 days. If patient returns call to provider office, please advise to call [place] at [phone].  [place] Management  Direct Dial: [phone]

## 2021-01-19 ENCOUNTER — Telehealth: Payer: Self-pay

## 2021-01-19 NOTE — Chronic Care Management (AMB) (Signed)
°  Care Management   Note  01/19/2021 Name: Shane Jones MRN: [id number] DOB: [birth year]  Shane Jones is a 76 y.o. year old male who is a primary care patient of Leone Haven, MD and is actively engaged with the care management team. I reached out to Irean Hong by phone today to assist with re-scheduling an initial visit with the Licensed Clinical Social Worker  Follow up plan: Unsuccessful telephone outreach attempt made.  The care management team will reach out to the patient again over the next 7 days.  If patient returns call to provider office, please advise to call [place]  at [place], [place], [place], [place] [address] Direct Dial: [phone] [email or web address]: [email or web address]

## 2021-01-24 ENCOUNTER — Telehealth: Payer: Self-pay | Admitting: Pharmacy Technician

## 2021-01-24 DIAGNOSIS — Z596 Low income: Secondary | ICD-10-CM

## 2021-01-24 NOTE — Chronic Care Management (AMB) (Signed)
°  Care Management   Note  01/24/2021 Name: [name] MRN: [id number] DOB: 11-08-1945  Shane Jones is a 76 y.o. year old male who is a primary care patient of Leone Haven, MD and is actively engaged with the care management team. I reached out to Irean Hong by phone today to assist with re-scheduling an initial visit with the Licensed Clinical Social Worker  Follow up plan: Unsuccessful telephone outreach attempt made. A HIPAA compliant phone message was left for the patient providing contact information and requesting a return call.  The care management team will reach out to the patient again over the next 7 days.  If patient returns call to provider office, please advise to call [place]  at [place], [place], [place], [place] [address] Direct Dial: [phone] [email or web address]: [email or web address]

## 2021-01-24 NOTE — Progress Notes (Signed)
[facility])                                            [place] Team    01/24/2021  Shane Jones Aug 05, 1945 [id number]  2 care coordination calls placed to [place] in regard to Trulicity, Basaglar and Humalog application and 2nd call to [facility] in regard to [place] application.  Spoke to [place] at [place] who informs patient is APPROVED 01/11/21-01/01/22. She informs medications will ship out based on last fill date in 2022 and going forward with delivery to the patient's home.  Spoke to [facility] at [facility] in regard to [place] application and she informs patient would need to apply for LIS. If denied then they would need the denial letter faxed to [facility]. If patient receives partial subsidy, then they would need the denial letter AND proof of copay faxed to [facility].   Will send in basket message to embedded PharmD for assistance with having patient apply for Extra Help.  [name] P. Gaynor Genco, [place]  [phone]

## 2021-01-25 NOTE — Chronic Care Management (AMB) (Signed)
°  Care Management   Note  01/25/2021 Name: Shane Jones MRN: [id number] DOB: 1945-04-24  Shane Jones is a 75 y.o. year old male who is a primary care patient of Leone Haven, MD and is actively engaged with the care management team. I reached out to Irean Hong by phone today to assist with re-scheduling a follow up visit with the Licensed Clinical Social Worker  Follow up plan: A third unsuccessful telephone outreach attempt made. Unable to make contact on outreach attempts x 3. PCP Leone Haven, MD notified via routed documentation in medical record. We have been unable to make contact with the patient for follow up. The care management team is available to follow up with the patient after provider conversation with the patient regarding recommendation for care management engagement and subsequent re-referral to the care management team.   [facility]  Direct Dial: [phone]

## 2021-02-01 NOTE — Chronic Care Management (AMB) (Signed)
°  Care Management   Note  02/01/2021 Name: Shane Jones MRN: [id number] DOB: 07-21-1945  Shane Jones is a 76 y.o. year old male who is a primary care patient of Leone Haven, MD and is actively engaged with the care management team. I reached out to Irean Hong by phone today to assist with re-scheduling an initial visit with the Licensed Clinical Social Worker  Follow up plan: Unable to make contact on outreach attempts x 3. PCP Leone Haven, MD notified via routed documentation in medical record.   Noreene Larsson, [place], [place], [place] [address] Direct Dial: [phone] [email or web address]

## 2021-02-02 ENCOUNTER — Other Ambulatory Visit: Payer: Self-pay

## 2021-02-02 ENCOUNTER — Ambulatory Visit (INDEPENDENT_AMBULATORY_CARE_PROVIDER_SITE_OTHER): Payer: Medicare Other | Admitting: Pharmacist

## 2021-02-02 ENCOUNTER — Encounter: Payer: Self-pay | Admitting: Family Medicine

## 2021-02-02 ENCOUNTER — Ambulatory Visit (INDEPENDENT_AMBULATORY_CARE_PROVIDER_SITE_OTHER): Payer: Medicare Other | Admitting: Family Medicine

## 2021-02-02 VITALS — BP 120/80 | HR 85 | Temp 98.7°F | Ht 65.0 in | Wt 173.8 lb

## 2021-02-02 DIAGNOSIS — E1165 Type 2 diabetes mellitus with hyperglycemia: Secondary | ICD-10-CM

## 2021-02-02 DIAGNOSIS — E785 Hyperlipidemia, unspecified: Secondary | ICD-10-CM

## 2021-02-02 DIAGNOSIS — I10 Essential (primary) hypertension: Secondary | ICD-10-CM

## 2021-02-02 DIAGNOSIS — N529 Male erectile dysfunction, unspecified: Secondary | ICD-10-CM | POA: Diagnosis not present

## 2021-02-02 DIAGNOSIS — F4321 Adjustment disorder with depressed mood: Secondary | ICD-10-CM | POA: Diagnosis not present

## 2021-02-02 DIAGNOSIS — N1831 Chronic kidney disease, stage 3a: Secondary | ICD-10-CM | POA: Diagnosis not present

## 2021-02-02 DIAGNOSIS — Z794 Long term (current) use of insulin: Secondary | ICD-10-CM

## 2021-02-02 MED ORDER — INSULIN LISPRO (1 UNIT DIAL) 100 UNIT/ML (KWIKPEN): 18.0000 [IU] | PEN_INJECTOR | Freq: Two times a day (BID) | SUBCUTANEOUS | 0 refills | Status: DC

## 2021-02-02 MED ORDER — BASAGLAR KWIKPEN 100 UNIT/ML ~~LOC~~ SOPN: 24.0000 [IU] | PEN_INJECTOR | Freq: Every day | SUBCUTANEOUS | 0 refills | Status: DC

## 2021-02-02 MED ORDER — SILDENAFIL CITRATE 50 MG PO TABS: 50.0000 mg | ORAL_TABLET | Freq: Every day | ORAL | 0 refills | Status: DC | PRN

## 2021-02-02 MED ORDER — TETANUS-DIPHTH-ACELL PERTUSSIS 5-2.5-18.5 LF-MCG/0.5 IM SUSP: 0.5000 mL | Freq: Once | INTRAMUSCULAR | 0 refills | Status: AC

## 2021-02-02 NOTE — Patient Instructions (Addendum)
Nice to see you. Please start taking Basaglar 24 units once daily.  You will continue your Humalog 18 units twice daily with meals.  Please take this 15 minutes prior to your meal.  If you start to have low blood sugars less than 70 with these increases please let us know.  We will contact you with your lab results.

## 2021-02-02 NOTE — Assessment & Plan Note (Addendum)
Poorly controlled.  We will have him increase his Basaglar to 24 units once daily and continue his Humalog at 18 units twice daily with meals and he will take this 15 minutes before his meal.  He will continue metformin 500 mg twice daily, Jardiance 25 mg daily, Trulicity 4.5 mg weekly.  Check A1c.

## 2021-02-02 NOTE — Progress Notes (Signed)
[name], MD Phone: [phone]  Shane Jones is a 76 y.o. male who presents today for f/u.  HYPERTENSION Disease Monitoring Home BP Monitoring close to 355 systolic at times Chest pain- no    Dyspnea- no Medications Compliance-  taking amlodipine, cardura, lasix, hydralazine, losartan.   Edema- no BMET    Component Value Date/Time   NA 139 11/15/2020 1201   K 3.4 (L) 11/15/2020 1201   CL 100 11/15/2020 1201   CO2 29 11/15/2020 1201   GLUCOSE 307 (H) 11/15/2020 1201   BUN 29 (H) 11/15/2020 1201   CREATININE 1.82 (H) 11/15/2020 1201   CREATININE 1.15 10/07/2018 1351   CALCIUM 9.0 11/15/2020 1201   DIABETES Disease Monitoring: Blood Sugar ranges-using libre, though did not use if for a while due to sensor issues, 42% very high, 26% high Polyuria/phagia/dipsia- no      Optho- UTD Medications: Compliance- taking basaglar 22 u daily, humalog 16-18 [name] BID with meals, jardiance, trulicity, metformin Hypoglycemic symptoms- no symptoms, 2 lows in the past couple of weeks  Grief/anxiety: The patient lost his brother recently.  He spoke with the social worker for acute evaluation.  He notes he has progressively improved.  He notes depression has resolved.  He notes no SI.  Erectile dysfunction: Patient has been taking sildenafil as needed.  Notes it works well.  No pain with erections.   Social History   Tobacco Use  Smoking Status Former   Types: Cigarettes   Quit date: 11/20/1978   Years since quitting: 42.2  Smokeless Tobacco Never    Current Outpatient Medications on File Prior to Visit  Medication Sig Dispense Refill   Accu-Chek FastClix Lancets MISC USE UP TO FOUR TIMES DAILY AS DIRECTED 102 each 2   ACCU-CHEK GUIDE test strip USE UP TO FOUR TIMES DAILY AS DIRECTED 100 each 2   allopurinol (ZYLOPRIM) 100 MG tablet TAKE 1 TABLET DAILY 90 tablet 1   amLODipine (NORVASC) 10 MG tablet TAKE 1 TABLET EVERY DAY 90 tablet 1   aspirin 325 MG tablet Take 325 mg by mouth  daily.     atorvastatin (LIPITOR) 80 MG tablet TAKE 1 TABLET EVERY DAY 90 tablet 1   B Complex Vitamins (VITAMIN-B COMPLEX PO) Take by mouth.     BETA CAROTENE PO Take by mouth.     blood glucose meter kit and supplies Dispense based on patient and insurance preference. Use up to four times daily as directed. (FOR ICD-10 E10.9, E11.9). 1 each 0   Calcium Carbonate-Vitamin D (CALCIUM + D PO) Take by mouth.     ciprofloxacin (CIPRO) 500 MG tablet Take 1 tablet (500 mg total) by mouth 2 (two) times daily. 14 tablet 0   Continuous Blood Gluc Sensor (FREESTYLE LIBRE 2 SENSOR) MISC by Does not apply route.     doxazosin (CARDURA) 4 MG tablet Take 4 mg by mouth daily.     Dulaglutide (TRULICITY) 4.5 HR/4.1UL SOPN Inject 4.5 mg as directed once a week. 6 mL 1   empagliflozin (JARDIANCE) 25 MG TABS tablet Take 25 mg by mouth daily. 90 tablet 3   escitalopram (LEXAPRO) 20 MG tablet TAKE 1 TABLET (20 MG) BY MOUTH DAILY 90 tablet 1   ezetimibe (ZETIA) 10 MG tablet Take 1 tablet (10 mg total) by mouth daily. 90 tablet 1   furosemide (LASIX) 20 MG tablet Take one tablet (20 mg total) by mouth daily. 90 tablet 1   gabapentin (NEURONTIN) 400 MG capsule Take 1 capsule (  400 mg total) by mouth 3 (three) times daily. 270 capsule 0   hydrALAZINE (APRESOLINE) 10 MG tablet TAKE 1 TABLET(10 MG) BY MOUTH IN THE MORNING AND AT BEDTIME 60 tablet 2   Insulin Pen Needle (PEN NEEDLES) 32G X 4 MM MISC Use to inject insulin up to 4 times daily 400 each 3   losartan (COZAAR) 100 MG tablet TAKE 1 TABLET EVERY DAY 90 tablet 1   magnesium 30 MG tablet Take 30 mg by mouth 2 (two) times daily.     metFORMIN (GLUCOPHAGE XR) 500 MG 24 hr tablet Take 1 tablet (500 mg total) by mouth in the morning and at bedtime. 180 tablet 3   metoprolol succinate (TOPROL-XL) 100 MG 24 hr tablet TAKE 1 TABLET EVERY DAY 90 tablet 1   Multiple Vitamin (MULTIVITAMIN) tablet Take 1 tablet by mouth daily.     pantoprazole (PROTONIX) 40 MG tablet Take 1  tablet (40 mg total) by mouth daily. 90 tablet 0   Zinc Sulfate (ZINC 15 PO) Take by mouth.     potassium chloride SA (KLOR-CON) 20 MEQ tablet Take 2 tablets (40 mEq total) by mouth daily for 3 days. 6 tablet 0   No current facility-administered medications on file prior to visit.     ROS see history of present illness  Objective  Physical Exam Vitals:   02/02/21 1605  BP: 120/80  Pulse: 85  Temp: 98.7 F (37.1 C)  SpO2: 97%    BP Readings from Last 3 Encounters:  02/02/21 120/80  11/15/20 (!) 142/86  09/29/20 118/80   Wt Readings from Last 3 Encounters:  02/02/21 173 lb 12.8 oz (78.8 kg)  11/15/20 176 lb 9.6 oz (80.1 kg)  09/29/20 176 lb (79.8 kg)    Physical Exam Constitutional:      General: He is not in acute distress.    Appearance: He is not diaphoretic.  Cardiovascular:     Rate and Rhythm: Normal rate and regular rhythm.     Heart sounds: Normal heart sounds.  Pulmonary:     Effort: Pulmonary effort is normal.     Breath sounds: Normal breath sounds.  Skin:    General: Skin is warm and dry.  Neurological:     Mental Status: He is alert.     Assessment/Plan: Please see individual problem list.  Problem List Items Addressed This Visit     Hypertension - Primary (Chronic)    Generally well controlled for his age.  He will continue amlodipine 10 mg daily, Cardura 4 mg daily, Lasix 20 mg daily, hydralazine 10 mg twice daily, and losartan 100 mg daily, and metoprolol 100 mg daily.  Check BMP.      Relevant Medications   sildenafil (VIAGRA) 50 MG tablet   Other Relevant Orders   Basic Metabolic Panel (BMET)   Erectile dysfunction    Viagra has been beneficial.  This will be refilled for the patient.      Relevant Medications   sildenafil (VIAGRA) 50 MG tablet   Grief    Related to the loss of his brother.  He has been improving.  He will monitor.  He knows the Education officer, museum is available to speak with if he worsens.  He will contact us if he has  any worsening symptoms.      Stage 3a chronic kidney disease ([place])   Relevant Orders   Basic Metabolic Panel (BMET)   T0GY (type 2 diabetes mellitus) ([place])    Poorly controlled.  We will have him increase his Basaglar to 24 units once daily and continue his Humalog at 18 units twice daily with meals and he will take this 15 minutes before his meal.  He will continue metformin 500 mg twice daily, Jardiance 25 mg daily, Trulicity 4.5 mg weekly.  Check A1c.      Relevant Medications   Insulin Glargine (BASAGLAR KWIKPEN) 100 UNIT/ML   insulin lispro (HUMALOG KWIKPEN) 100 UNIT/ML KwikPen   Other Relevant Orders   HgB A1c     Health Maintenance: The patient will get his Shingrix vaccine and tetanus vaccine at the pharmacy.  Return in about 3 months (around 05/02/2021) for DM, HTN.  This visit occurred during the SARS-CoV-2 public health emergency.  Safety protocols were in place, including screening questions prior to the visit, additional usage of staff PPE, and extensive cleaning of exam room while observing appropriate contact time as indicated for disinfecting solutions.    [name], MD [facility]

## 2021-02-02 NOTE — Assessment & Plan Note (Signed)
Viagra has been beneficial.  This will be refilled for the patient.

## 2021-02-02 NOTE — Patient Instructions (Addendum)
Shane Jones,   We applied for Medicare Low Income Subsidy/Extra Help today. When you receive the decision letter in the mail from the [place], please drop a copy off at our office.  Increase Basaglar slow acting - 24 units daily Continue Humalog 18 units before a meal.   Please log on your phone when you eat a meal or give your insulin.   We recommend the Shingrix (shingles) vaccine series for all over age 78. We also recommend the Tdap (tetanus, diptheria, and pertussis) every 10 years.   Both should have a $0 copay on all Medicare plans this year. You can pursue this without a prescription at your local pharmacy, or feel free to call our [place] at [facility] at [phone].   Take care!  Catie Darnelle Maffucci, PharmD  Visit Information  Following are the goals we discussed today:  Patient Goals/Self-Care Activities Over the next 90 days, patient will:  - take medications as prescribed check glucose three times daily using CGM, document, and provide at future appointments check blood pressure periodically, document, and provide at future appointments collaborate with provider on medication access solutions        Plan: Face to Face appointment with care management team member scheduled for: 6 weeks   Catie Darnelle Maffucci, PharmD, [place], CPP Clinical Pharmacist [facility] at [facility] [phone]     Please call the care guide team at [phone] if you need to cancel or reschedule your appointment.   Print copy of patient instructions, educational materials, and care plan provided in person.

## 2021-02-02 NOTE — Assessment & Plan Note (Signed)
Generally well controlled for his age.  He will continue amlodipine 10 mg daily, Cardura 4 mg daily, Lasix 20 mg daily, hydralazine 10 mg twice daily, and losartan 100 mg daily, and metoprolol 100 mg daily.  Check BMP.

## 2021-02-02 NOTE — Assessment & Plan Note (Signed)
Related to the loss of his brother.  He has been improving.  He will monitor.  He knows the Education officer, museum is available to speak with if he worsens.  He will contact us if he has any worsening symptoms.

## 2021-02-02 NOTE — Chronic Care Management (AMB) (Signed)
Chronic Care Management CCM Pharmacy Note  02/02/2021 Name:  Shane Jones MRN:  [id number] DOB:  1945/06/04  Summary: - Post prandial elevations. Overnight snacking.  - Needs to apply for Medicare Extra Help due to income  Recommendations/Changes made from today's visit: - Increase Basaglar to 24 units daily. Continue Humalog 18 units with meals. Reinforced appropriate administration - Assisted in application for Medicare Extra Help  - Discussed vaccinations  Subjective: Shane Jones is an 76 y.o. year old male who is a primary patient of Sonnenberg, Angela Adam, MD.  The CCM team was consulted for assistance with disease management and care coordination needs.    Engaged with patient face to face for follow up visit for pharmacy case management and/or care coordination services.   Objective:  Medications Reviewed Today     Reviewed by Gordy Councilman, CMA (Certified Medical Assistant) on 02/02/21 at 1617  Med List Status: <None>   Medication Order Taking? Sig Documenting Provider Last Dose Status Informant  Accu-Chek FastClix Lancets MISC [id number] Yes USE UP TO FOUR TIMES DAILY AS DIRECTED Leone Haven, MD Taking Active   ACCU-CHEK GUIDE test strip [id number] Yes USE UP TO FOUR TIMES DAILY AS DIRECTED Leone Haven, MD Taking Active   allopurinol (ZYLOPRIM) 100 MG tablet [id number] Yes TAKE 1 TABLET DAILY Leone Haven, MD Taking Active   amLODipine (NORVASC) 10 MG tablet [id number] Yes TAKE 1 TABLET EVERY DAY Leone Haven, MD Taking Active   aspirin 325 MG tablet [id number] Yes Take 325 mg by mouth daily. [provider] Taking Active   atorvastatin (LIPITOR) 80 MG tablet [id number] Yes TAKE 1 TABLET EVERY DAY Leone Haven, MD Taking Active   B Complex Vitamins (VITAMIN-B COMPLEX PO) [id number] Yes Take by mouth. [provider] Taking Active   BETA CAROTENE PO [id number] Yes Take by mouth. [provider] Taking Active   blood glucose  meter kit and supplies [id number] Yes Dispense based on patient and insurance preference. Use up to four times daily as directed. (FOR ICD-10 E10.9, E11.9). Leone Haven, MD Taking Active   Calcium Carbonate-Vitamin D (CALCIUM + D PO) [id number] Yes Take by mouth. [provider] Taking Active   ciprofloxacin (CIPRO) 500 MG tablet [id number] Yes Take 1 tablet (500 mg total) by mouth 2 (two) times daily. Flinchum, Kelby Aline, FNP Taking Active   Continuous Blood Gluc Sensor (FREESTYLE LIBRE 2 SENSOR) [place] [id number] Yes by Does not apply route. [provider] Taking Active   doxazosin (CARDURA) 4 MG tablet [id number] Yes Take 4 mg by mouth daily. [provider] Taking Active   Dulaglutide (TRULICITY) 4.5 OH/6.0VP SOPN [id number] Yes Inject 4.5 mg as directed once a week. Leone Haven, MD Taking Active            Med Note De Hollingshead   Tue Jun 15, 2020  4:31 PM)    empagliflozin (JARDIANCE) 25 MG TABS tablet [id number] Yes Take 25 mg by mouth daily. Leone Haven, MD Taking Active   escitalopram (LEXAPRO) 20 MG tablet [id number] Yes TAKE 1 TABLET (20 MG) BY MOUTH DAILY Leone Haven, MD Taking Active   ezetimibe (ZETIA) 10 MG tablet [id number] Yes Take 1 tablet (10 mg total) by mouth daily. Leone Haven, MD Taking Active   furosemide (LASIX) 20 MG tablet [id number] Yes Take one tablet (20 mg total) by mouth daily. Leone Haven, MD Taking Active   gabapentin (NEURONTIN) 400 MG  capsule [id number] Yes Take 1 capsule (400 mg total) by mouth 3 (three) times daily. Leone Haven, MD Taking Active            Med Note Mayo Ao Sep 13, 2020  4:26 PM) Taking PRN  hydrALAZINE (APRESOLINE) 10 MG tablet [id number] Yes TAKE 1 TABLET(10 MG) BY MOUTH IN THE MORNING AND AT BEDTIME Leone Haven, MD Taking Active   Insulin Glargine [facility] KWIKPEN) 100 UNIT/ML [id number] Yes Inject 26 Units into the skin daily. Leone Haven,  MD Taking Active   insulin lispro (HUMALOG KWIKPEN) 100 UNIT/ML KwikPen [id number] Yes Inject 20-22 Units into the skin 2 (two) times daily with a meal. Leone Haven, MD Taking Active   Insulin Pen Needle (PEN NEEDLES) 32G X 4 MM MISC [id number] Yes Use to inject insulin up to 4 times daily Leone Haven, MD Taking Active   losartan (COZAAR) 100 MG tablet [id number] Yes TAKE 1 TABLET EVERY DAY Leone Haven, MD Taking Active   magnesium 30 MG tablet [id number] Yes Take 30 mg by mouth 2 (two) times daily. [provider] Taking Active   metFORMIN (GLUCOPHAGE XR) 500 MG 24 hr tablet [id number] Yes Take 1 tablet (500 mg total) by mouth in the morning and at bedtime. Leone Haven, MD Taking Active   metoprolol succinate (TOPROL-XL) 100 MG 24 hr tablet [id number] Yes TAKE 1 TABLET EVERY DAY Leone Haven, MD Taking Active   Multiple Vitamin (MULTIVITAMIN) tablet [id number] Yes Take 1 tablet by mouth daily. [provider] Taking Active   pantoprazole (PROTONIX) 40 MG tablet [id number] Yes Take 1 tablet (40 mg total) by mouth daily. Leone Haven, MD Taking Active   potassium chloride SA (KLOR-CON) 20 MEQ tablet [id number]  Take 2 tablets (40 mEq total) by mouth daily for 3 days. Leone Haven, MD  Expired 10/03/20 2359   sildenafil (VIAGRA) 50 MG tablet [id number] Yes Take 1 tablet (50 mg total) by mouth daily as needed for erectile dysfunction. Leone Haven, MD Taking Active   Zinc Sulfate (ZINC 15 PO) [id number] Yes Take by mouth. [provider] Taking Active             Pertinent Labs:   Lab Results  Component Value Date   HGBA1C 7.3 (H) 09/29/2020   Lab Results  Component Value Date   CHOL 107 09/29/2020   HDL 31.90 (L) 09/29/2020   LDLCALC 23 08/07/2016   LDLDIRECT 53.0 09/29/2020   TRIG 208.0 (H) 09/29/2020   CHOLHDL 3 09/29/2020   Lab Results  Component Value Date   CREATININE 1.82 (H) 11/15/2020   BUN 29 (H) 11/15/2020    NA 139 11/15/2020   K 3.4 (L) 11/15/2020   CL 100 11/15/2020   CO2 29 11/15/2020    SDOH:  (Social Determinants of Health) assessments and interventions performed:  SDOH Interventions    Flowsheet Row Most Recent Value  SDOH Interventions   Financial Strain Interventions Other (Comment)  [manufacturer assistance]       CCM Care Plan  Review of patient past medical history, allergies, medications, health status, including review of consultants reports, laboratory and other test data, was performed as part of comprehensive evaluation and provision of chronic care management services.   Care Plan : Medication Management  Updates made by De Hollingshead, RPH-CPP since 02/02/2021 12:00 AM     Problem: Diabetes, Hypertension, CKD, Depression      Long-Range Goal:  Disease Progression Prevention   Recent Progress: On track  Priority: High  Note:   Current Barriers:  Unable to independently afford treatment regimen Unable to independently monitor therapeutic efficacy Unable to achieve control of diabetes   Pharmacist Clinical Goal(s):  Over the next 90 days, patient will verbalize ability to afford treatment regimen. Over the next 90 days, patient will achieve control of diabetes as evidenced by improvement in A1c through collaboration with PharmD and provider.   Interventions: 1:1 collaboration with Leone Haven, MD regarding development and update of comprehensive plan of care as evidenced by provider attestation and co-signature Inter-disciplinary care team collaboration (see longitudinal plan of care) Comprehensive medication review performed; medication list updated in electronic medical record  Health Maintenance   Yearly diabetic eye exam: up to date Yearly diabetic foot exam: up to date Urine microalbumin: up to date Yearly influenza vaccination: due Td/Tdap vaccination: due - encouraged today Pneumonia vaccination: up to date COVID vaccinations: due-  encouraged today Shingrix vaccinations: due - Encouraged today Colonoscopy: due - declined  Diabetes: Controlled at relaxed goal of A1c <7.5%; current treatment: metformin XR 1000 mg BID, Jardiance 25 mg daily, Trulicity 4.5 mg; Basaglar 22 units once daily, Humalog 18 units with meals. He cannot verbalize a specific sliding scale that he uses.  Approved for Trulicity, Engineer, agricultural, Humalog assistance through [employer] through 01/01/22 Date of Download: 1/19-02/02/21 % Time CGM is active: 57% Average Glucose: 224 mg/dL Glucose Management Indicator: 8.7  Glucose Variability: 38.7 (goal <36%) Time in Goal:  - Time in range 70-180: 31% - Time above range: 68% - Time below range: 1% Observed patterns: significant overnight spikes Single income is low enough that [employer] is requiring him to apply for Medicare Extra Help. They will require proof of denial to enroll patient in assistance for 2023. Completed today Collaborated with PCP. Increase Basaglar to 24 units daily. Continue Humalog 18 units with meals. Reinforced taking mealtime insulin 15 minutes prior to his meal   Hypertension: Controlled per last clinic reading. current treatment: furosemide 20 mg QAM, losartan 100 mg QAM, metoprolol XL 100 mg QAM, amlodipine 10 mg daily QAM, doxazosin 4 mg QPM; hydralazine 10 mg BID Previously recommended to continue current regimen at this time.   Hyperlipidemia: Controlled; current treatment: atorvastatin 80 mg, ezetimibe 10 mg  Antiplatelet regimen: aspirin 325 mg daily (hx CVA per chart review) Previously recommended to continue current regimen at this time. Li  Depression: Uncontrolled per last visit; current treatment: escitalopram 20 mg daily;  LCSW has been unable to get in contact with patient for evaluation   Peripheral Neuropathy: Improved per patient report; current regimen: gabapentin 400 mg TID - though he is taking PRN Previously recommended to continue current regimen at this  time. Follow repeat BMP and adjust gabapentin as needed.   Gout: Controlled per patient report; current regimen: allopurinol 100 mg daily  Last uric acid WNL Previously recommended to continue current regimen at this time  GERD: Moderately well controlled per patient report; current regimen: pantoprazole 40 mg daily Previously recommended to continue current regimen at this time  Patient Goals/Self-Care Activities Over the next 90 days, patient will:  - take medications as prescribed check glucose three times daily using CGM, document, and provide at future appointments check blood pressure periodically, document, and provide at future appointments collaborate with provider on medication access solutions      Plan: Face to Face appointment with care management team member scheduled for: 6 weeks  Catie Darnelle Maffucci, PharmD, [place], [place] Clinical Pharmacist [employer] at [employer] [phone]

## 2021-02-03 LAB — BASIC METABOLIC PANEL
BUN: 16 mg/dL (ref 6–23)
CO2: 34 mEq/L — ABNORMAL HIGH (ref 19–32)
Calcium: 9.4 mg/dL (ref 8.4–10.5)
Chloride: 103 mEq/L (ref 96–112)
Creatinine, Ser: 1.52 mg/dL — ABNORMAL HIGH (ref 0.40–1.50)
GFR: 43.81 mL/min — ABNORMAL LOW (ref >60.00)
Glucose, Bld: 117 mg/dL — ABNORMAL HIGH (ref 70–99)
Potassium: 3.5 mEq/L (ref 3.5–5.1)
Sodium: 144 mEq/L (ref 135–145)

## 2021-02-03 LAB — HEMOGLOBIN A1C: Hgb A1c MFr Bld: 8.9 % — ABNORMAL HIGH (ref 4.6–6.5)

## 2021-02-10 ENCOUNTER — Telehealth: Payer: Self-pay

## 2021-02-10 ENCOUNTER — Ambulatory Visit: Payer: Medicare Other

## 2021-02-10 NOTE — Telephone Encounter (Signed)
Unable to reach patient after several attempts for scheduled AWV. Reschedule.

## 2021-02-16 DIAGNOSIS — Z20822 Contact with and (suspected) exposure to covid-19: Secondary | ICD-10-CM | POA: Diagnosis not present

## 2021-02-23 ENCOUNTER — Telehealth: Payer: Self-pay | Admitting: Family Medicine

## 2021-02-23 NOTE — Telephone Encounter (Signed)
Pt has been notified.

## 2021-02-23 NOTE — Telephone Encounter (Signed)
Pt called in upset about his freestyle libre. Pt stated that he is getting bad ones and that he would like to come and pick one up from the pharmacist. Pt stated the pharmacist does not have to call him but for someone up front to let him know that he can come and get it.

## 2021-03-01 DIAGNOSIS — E1165 Type 2 diabetes mellitus with hyperglycemia: Secondary | ICD-10-CM | POA: Diagnosis not present

## 2021-03-01 DIAGNOSIS — Z794 Long term (current) use of insulin: Secondary | ICD-10-CM | POA: Diagnosis not present

## 2021-03-01 DIAGNOSIS — E785 Hyperlipidemia, unspecified: Secondary | ICD-10-CM | POA: Diagnosis not present

## 2021-03-01 DIAGNOSIS — I10 Essential (primary) hypertension: Secondary | ICD-10-CM | POA: Diagnosis not present

## 2021-03-04 ENCOUNTER — Telehealth: Payer: Self-pay

## 2021-03-04 NOTE — Progress Notes (Signed)
Catie, ? ?Pt called in reference to his libre. Pt does not feel that it is the same as he previously had and would like to discuss with you. ? ?Thank you  ?Noreene Larsson, RMA ?Care Guide, Embedded Care Coordination ?[facility]  Care Management  ?[place], [place] [address] ?Direct Dial: [phone] ?[email or web address] ?Website: [email or web address]  ? ?

## 2021-03-07 NOTE — Telephone Encounter (Signed)
Pt called in stating that he just received his new libre 2. Pt stated that the old machine had a fiber glass needle but now the new Elenor Legato has a mental needle. Pt sated he would like to speak with before he stick the libre in his arm. Pt requesting callback  ?

## 2021-03-07 NOTE — Telephone Encounter (Signed)
Called patient. He is concerned about the needle/filament in the [place] sensor being different. He also needs to reschedule our 3/20 appointment.  ? ?Scheduled in office visit tomorrow ?

## 2021-03-08 ENCOUNTER — Other Ambulatory Visit: Payer: Self-pay

## 2021-03-08 ENCOUNTER — Ambulatory Visit (INDEPENDENT_AMBULATORY_CARE_PROVIDER_SITE_OTHER): Payer: Medicare Other | Admitting: Pharmacist

## 2021-03-08 DIAGNOSIS — E785 Hyperlipidemia, unspecified: Secondary | ICD-10-CM

## 2021-03-08 DIAGNOSIS — Z794 Long term (current) use of insulin: Secondary | ICD-10-CM

## 2021-03-08 DIAGNOSIS — I10 Essential (primary) hypertension: Secondary | ICD-10-CM

## 2021-03-08 DIAGNOSIS — E1165 Type 2 diabetes mellitus with hyperglycemia: Secondary | ICD-10-CM

## 2021-03-08 MED ORDER — INSULIN LISPRO (1 UNIT DIAL) 100 UNIT/ML (KWIKPEN): 20.0000 [IU] | PEN_INJECTOR | Freq: Two times a day (BID) | SUBCUTANEOUS | 0 refills | Status: DC

## 2021-03-08 NOTE — Chronic Care Management (AMB) (Signed)
Chronic Care Management CCM Pharmacy Note  03/08/2021 Name:  Shane Jones MRN:  [id number] DOB:  [birth year]  Summary: - Presents with concerns related to applying Libre 2. Walked through application process with him today  Recommendations/Changes made from today's visit: - Take Basaglar 24 units daily, Humalog 20 units with meals, Trulicity 4.5 mg weekly, Jardiance 25 mg daily.  - Follow up with CPhT regarding medication access  Subjective: Shane Jones is an 76 y.o. year old male who is a primary patient of Sonnenberg, Angela Adam, MD.  The CCM team was consulted for assistance with disease management and care coordination needs.    Engaged with patient face to face for follow up visit for pharmacy case management and/or care coordination services.   Objective:  Medications Reviewed Today     Reviewed by Gordy Councilman, CMA (Certified Medical Assistant) on 02/02/21 at 1617  Med List Status: <None>   Medication Order Taking? Sig Documenting Provider Last Dose Status Informant  Accu-Chek FastClix Lancets MISC [id number] Yes USE UP TO FOUR TIMES DAILY AS DIRECTED Leone Haven, MD Taking Active   ACCU-CHEK GUIDE test strip [id number] Yes USE UP TO FOUR TIMES DAILY AS DIRECTED Leone Haven, MD Taking Active   allopurinol (ZYLOPRIM) 100 MG tablet [id number] Yes TAKE 1 TABLET DAILY Leone Haven, MD Taking Active   amLODipine (NORVASC) 10 MG tablet [id number] Yes TAKE 1 TABLET EVERY DAY Leone Haven, MD Taking Active   aspirin 325 MG tablet [id number] Yes Take 325 mg by mouth daily. [provider] Taking Active   atorvastatin (LIPITOR) 80 MG tablet [id number] Yes TAKE 1 TABLET EVERY DAY Leone Haven, MD Taking Active   B Complex Vitamins (VITAMIN-B COMPLEX PO) [id number] Yes Take by mouth. [provider] Taking Active   BETA CAROTENE PO [id number] Yes Take by mouth. [provider] Taking Active   blood glucose meter kit and supplies [id number]  Yes Dispense based on patient and insurance preference. Use up to four times daily as directed. (FOR ICD-10 E10.9, E11.9). Leone Haven, MD Taking Active   Calcium Carbonate-Vitamin D (CALCIUM + D PO) [id number] Yes Take by mouth. [provider] Taking Active   ciprofloxacin (CIPRO) 500 MG tablet [id number] Yes Take 1 tablet (500 mg total) by mouth 2 (two) times daily. Flinchum, Kelby Aline, FNP Taking Active   Continuous Blood Gluc Sensor (FREESTYLE LIBRE 2 SENSOR) [place] [id number] Yes by Does not apply route. [provider] Taking Active   doxazosin (CARDURA) 4 MG tablet [id number] Yes Take 4 mg by mouth daily. [provider] Taking Active   Dulaglutide (TRULICITY) 4.5 BP/7.9KF SOPN [id number] Yes Inject 4.5 mg as directed once a week. Leone Haven, MD Taking Active            Med Note De Hollingshead   Tue Jun 15, 2020  4:31 PM)    empagliflozin (JARDIANCE) 25 MG TABS tablet [id number] Yes Take 25 mg by mouth daily. Leone Haven, MD Taking Active   escitalopram (LEXAPRO) 20 MG tablet [id number] Yes TAKE 1 TABLET (20 MG) BY MOUTH DAILY Leone Haven, MD Taking Active   ezetimibe (ZETIA) 10 MG tablet [id number] Yes Take 1 tablet (10 mg total) by mouth daily. Leone Haven, MD Taking Active   furosemide (LASIX) 20 MG tablet [id number] Yes Take one tablet (20 mg total) by mouth daily. Leone Haven, MD Taking Active   gabapentin (NEURONTIN) 400 MG capsule [id number]  Yes Take 1 capsule (400 mg total) by mouth 3 (three) times daily. Leone Haven, MD Taking Active            Med Note Mayo Ao Sep 13, 2020  4:26 PM) Taking PRN  hydrALAZINE (APRESOLINE) 10 MG tablet [id number] Yes TAKE 1 TABLET(10 MG) BY MOUTH IN THE MORNING AND AT BEDTIME Leone Haven, MD Taking Active   Insulin Glargine [facility] KWIKPEN) 100 UNIT/ML [id number] Yes Inject 26 Units into the skin daily. Leone Haven, MD Taking Active   insulin lispro  (HUMALOG KWIKPEN) 100 UNIT/ML KwikPen [id number] Yes Inject 20-22 Units into the skin 2 (two) times daily with a meal. Leone Haven, MD Taking Active   Insulin Pen Needle (PEN NEEDLES) 32G X 4 MM MISC [id number] Yes Use to inject insulin up to 4 times daily Leone Haven, MD Taking Active   losartan (COZAAR) 100 MG tablet [id number] Yes TAKE 1 TABLET EVERY DAY Leone Haven, MD Taking Active   magnesium 30 MG tablet [id number] Yes Take 30 mg by mouth 2 (two) times daily. [provider] Taking Active   metFORMIN (GLUCOPHAGE XR) 500 MG 24 hr tablet [id number] Yes Take 1 tablet (500 mg total) by mouth in the morning and at bedtime. Leone Haven, MD Taking Active   metoprolol succinate (TOPROL-XL) 100 MG 24 hr tablet [id number] Yes TAKE 1 TABLET EVERY DAY Leone Haven, MD Taking Active   Multiple Vitamin (MULTIVITAMIN) tablet [id number] Yes Take 1 tablet by mouth daily. [provider] Taking Active   pantoprazole (PROTONIX) 40 MG tablet [id number] Yes Take 1 tablet (40 mg total) by mouth daily. Leone Haven, MD Taking Active   potassium chloride SA (KLOR-CON) 20 MEQ tablet [id number]  Take 2 tablets (40 mEq total) by mouth daily for 3 days. Leone Haven, MD  Expired 10/03/20 2359   sildenafil (VIAGRA) 50 MG tablet [id number] Yes Take 1 tablet (50 mg total) by mouth daily as needed for erectile dysfunction. Leone Haven, MD Taking Active   Zinc Sulfate (ZINC 15 PO) [id number] Yes Take by mouth. [provider] Taking Active             Pertinent Labs:  Lab Results  Component Value Date   HGBA1C 8.9 (H) 02/02/2021   Lab Results  Component Value Date   CHOL 107 09/29/2020   HDL 31.90 (L) 09/29/2020   LDLCALC 23 08/07/2016   LDLDIRECT 53.0 09/29/2020   TRIG 208.0 (H) 09/29/2020   CHOLHDL 3 09/29/2020   Lab Results  Component Value Date   CREATININE 1.52 (H) 02/02/2021   BUN 16 02/02/2021   NA 144 02/02/2021   K 3.5  02/02/2021   CL 103 02/02/2021   CO2 34 (H) 02/02/2021    SDOH:  (Social Determinants of Health) assessments and interventions performed:  SDOH Interventions    Flowsheet Row Most Recent Value  SDOH Interventions   Financial Strain Interventions Other (Comment)  [manufacturer assistance]       CCM Care Plan  Review of patient past medical history, allergies, medications, health status, including review of consultants reports, laboratory and other test data, was performed as part of comprehensive evaluation and provision of chronic care management services.   Care Plan : Medication Management  Updates made by De Hollingshead, RPH-CPP since 03/08/2021 12:00 AM  Completed 03/08/2021   Problem: Diabetes, Hypertension, CKD, Depression Resolved 03/08/2021     Long-Range Goal: Disease Progression  Prevention Completed 03/08/2021  Recent Progress: On track  Priority: High  Note:   Current Barriers:  Unable to independently afford treatment regimen Unable to independently monitor therapeutic efficacy Unable to achieve control of diabetes   Pharmacist Clinical Goal(s):  Over the next 90 days, patient will verbalize ability to afford treatment regimen. Over the next 90 days, patient will achieve control of diabetes as evidenced by improvement in A1c through collaboration with PharmD and provider.   Interventions: 1:1 collaboration with Leone Haven, MD regarding development and update of comprehensive plan of care as evidenced by provider attestation and co-signature Inter-disciplinary care team collaboration (see longitudinal plan of care) Comprehensive medication review performed; medication list updated in electronic medical record  Health Maintenance   Yearly diabetic eye exam: up to date Yearly diabetic foot exam: up to date Urine microalbumin: up to date Yearly influenza vaccination: due Td/Tdap vaccination: due - encouraged today Pneumonia vaccination: up to date COVID  vaccinations: due- encouraged today Shingrix vaccinations: due - Encouraged today Colonoscopy: due - declined  Diabetes: Uncontrolled; current treatment: metformin XR 1000 mg BID, Jardiance 25 mg daily, Trulicity 4.5 mg weekly; Basaglar 22 units once or twice daily , Humalog 18 units with meals.  Approved for Trulicity, Basaglar, Humalog assistance through [employer] through 01/01/22 Presents today with concerns about how the [place] sensor appears. States that the needle/filament has changed. Reviewed with him that applicator needle is bigger than the filament that remains under the skin Considered changing Trulicity to more potent Ozempic, but patient states he just received a 4 month supply and doesn't want to make any changes at this time. Discussed need to take Basaglar consistently 24 units daily. Discussed use of Humalog with meals, take 20 units 10-15 units before a meal. Patient will follow up with PCP in 4 weeks  Hypertension: Controlled per last clinic reading. current treatment: furosemide 20 mg QAM, losartan 100 mg QAM, metoprolol XL 100 mg QAM, amlodipine 10 mg daily QAM, doxazosin 4 mg QPM; hydralazine 10 mg BID Previously recommended to continue current regimen at this time.   Hyperlipidemia: Controlled; current treatment: atorvastatin 80 mg, ezetimibe 10 mg  Antiplatelet regimen: aspirin 325 mg daily (hx CVA per chart review) Previously recommended to continue current regimen at this time. Li  Depression: Controlled per patient report; current treatment: escitalopram 20 mg daily;  Reports better control recently. Continue current regimen at this time   Peripheral Neuropathy: Improved per patient report; current regimen: gabapentin 400 mg TID - though he is taking PRN Previously recommended to continue current regimen at this time. Follow repeat BMP and adjust gabapentin as needed.   Gout: Controlled per patient report; current regimen: allopurinol 100 mg daily  Last uric  acid WNL Previously recommended to continue current regimen at this time  GERD: Moderately well controlled per patient report; current regimen: pantoprazole 40 mg daily Previously recommended to continue current regimen at this time  Patient Goals/Self-Care Activities Over the next 90 days, patient will:  - take medications as prescribed check glucose three times daily using CGM, document, and provide at future appointments check blood pressure periodically, document, and provide at future appointments collaborate with provider on medication access solutions      Plan: Closing Pharmacy case. Referral to RN CM.   Catie Darnelle Maffucci, PharmD, [place], [place] Clinical Pharmacist [employer] at [employer] [phone]

## 2021-03-08 NOTE — Patient Instructions (Addendum)
[name],  ? ?When you get the Medicare Extra Help letter from [place] in the mail, please call my pharmacy technician, Sharee Pimple [place] - [phone] ? ?Take Basaglar (white/green, basal insulin) 24 units once daily. This does not necessarily have to be taken with a meal.  ? ?Take Humalog (gray/red, mealtime insulin) 20 units before each meal. Take this ~ 10-15 minutes before you start eating the meal.  ? ?I will ask Amber to call you to schedule follow up with Sheppard Evens, our nurse case manager.  ? ?Take care. It has been a pleasure working with you.  ? ?Catie Darnelle Maffucci, PharmD ? ?Visit Information ? ?Following are the goals we discussed today:  ?  ?Patient Goals/Self-Care Activities ?Over the next 90 days, patient will:  ?- take medications as prescribed ?check glucose three times daily using CGM, document, and provide at future appointments ?check blood pressure periodically, document, and provide at future appointments ?collaborate with provider on medication access solutions ?   ?  ?  ?Plan: Closing Pharmacy case. Referral to RN CM.  ?  ?Catie Darnelle Maffucci, PharmD, [place], CPP ?Clinical Pharmacist ?Therapist, music at [employer] ?[phone] ? ? ?Please call the care guide team at [phone] if you need to cancel or reschedule your appointment.  ? ?Print copy of patient instructions, educational materials, and care plan provided in person.  ? ?

## 2021-03-21 ENCOUNTER — Telehealth: Payer: Medicare Other

## 2021-03-28 ENCOUNTER — Telehealth: Payer: Medicare Other

## 2021-04-01 DIAGNOSIS — E1165 Type 2 diabetes mellitus with hyperglycemia: Secondary | ICD-10-CM

## 2021-04-01 DIAGNOSIS — Z794 Long term (current) use of insulin: Secondary | ICD-10-CM | POA: Diagnosis not present

## 2021-04-01 DIAGNOSIS — E785 Hyperlipidemia, unspecified: Secondary | ICD-10-CM | POA: Diagnosis not present

## 2021-04-01 DIAGNOSIS — I10 Essential (primary) hypertension: Secondary | ICD-10-CM

## 2021-04-11 ENCOUNTER — Encounter: Payer: Self-pay | Admitting: Family Medicine

## 2021-04-11 ENCOUNTER — Ambulatory Visit (INDEPENDENT_AMBULATORY_CARE_PROVIDER_SITE_OTHER): Payer: Medicare Other | Admitting: Family Medicine

## 2021-04-11 VITALS — BP 118/80 | HR 60 | Temp 98.3°F | Ht 65.0 in | Wt 178.4 lb

## 2021-04-11 DIAGNOSIS — E1165 Type 2 diabetes mellitus with hyperglycemia: Secondary | ICD-10-CM | POA: Diagnosis not present

## 2021-04-11 DIAGNOSIS — I10 Essential (primary) hypertension: Secondary | ICD-10-CM

## 2021-04-11 DIAGNOSIS — J301 Allergic rhinitis due to pollen: Secondary | ICD-10-CM | POA: Diagnosis not present

## 2021-04-11 DIAGNOSIS — Z794 Long term (current) use of insulin: Secondary | ICD-10-CM

## 2021-04-11 MED ORDER — INSULIN LISPRO (1 UNIT DIAL) 100 UNIT/ML (KWIKPEN): 22.0000 [IU] | PEN_INJECTOR | Freq: Two times a day (BID) | SUBCUTANEOUS | 0 refills | Status: DC

## 2021-04-11 MED ORDER — FLUTICASONE PROPIONATE 50 MCG/ACT NA SUSP: 2.0000 | Freq: Every day | NASAL | 6 refills | Status: DC

## 2021-04-11 MED ORDER — BASAGLAR KWIKPEN 100 UNIT/ML ~~LOC~~ SOPN: 20.0000 [IU] | PEN_INJECTOR | Freq: Every day | SUBCUTANEOUS | 0 refills | Status: DC

## 2021-04-11 NOTE — Patient Instructions (Signed)
Nice to see you. ?You can use the Flonase for your allergies.  If your symptoms progress please let me know. ?Endocrinology should call you to schedule an appointment. ?I am going to reduce your Basaglar to 20 units once daily to see if that will help minimize the risk of low sugars.  I do think you need to consistently take your Humalog 22 units with meals.  You should take this 10 to 15 minutes before you eat.  You do not need to take any extra insulin outside of when you are eating or taking your Basaglar once daily. ?

## 2021-04-12 DIAGNOSIS — J309 Allergic rhinitis, unspecified: Secondary | ICD-10-CM | POA: Insufficient documentation

## 2021-04-12 NOTE — Progress Notes (Signed)
?[name], MD ?Phone: [phone] ? ?Shane Jones is a 76 y.o. male who presents today for f/u. ? ?DIABETES ?Disease Monitoring: ?Blood Sugar ranges-Freestyle Elenor Legato was reviewed.  The patient is in target range 44% of the time, high 37% of the time, very high 14% of the time, low 4% of the time, very low 1% of the time, he is checking typically at least 3 times daily. His hypoglycemic episodes seem to occur between 3 AM and 12 PM, his glucose goes up significantly after 12 PM, he eats 2-3 meals per day.  His first meal is around 1 PM.  His second meal is around 6 to 7 PM.      Optho-up-to-date ?Medications: ?Compliance-taking Basaglar 22 units once daily, taking Humalog 22-24 units with meals though if his sugar is less than 200 at the time of his meal he will only take 16 units, the patient does at times take additional Humalog if his sugar has gone up in between meals.  He also takes Trulicity, Jardiance, and metformin.   Hypoglycemic symptoms-see above for percent of hypoglycemia ? ?HYPERTENSION ?Disease Monitoring ?Home BP Monitoring 403 systolic Chest pain- no    Dyspnea- no ?Medications ?Compliance-  taking amlodipine, Lasix, hydralazine, losartan, Cardura, and metoprolol.   Edema- no ?BMET ?   ?Component Value Date/Time  ? NA 144 02/02/2021 1643  ? K 3.5 02/02/2021 1643  ? CL 103 02/02/2021 1643  ? CO2 34 (H) 02/02/2021 1643  ? GLUCOSE 117 (H) 02/02/2021 1643  ? BUN 16 02/02/2021 1643  ? CREATININE 1.52 (H) 02/02/2021 1643  ? CREATININE 1.15 10/07/2018 1351  ? CALCIUM 9.4 02/02/2021 1643  ? ?Allergic rhinitis: Patient notes intermittent issues with his nose getting stopped up.  Typically occurs when he is exposed to pollen.  He notes symptoms today with some congestion.  No cough or fevers.  In the past he has responded well to Flonase. ? ?Social History  ? ?Tobacco Use  ?Smoking Status Former  ? Types: Cigarettes  ? Quit date: 11/20/1978  ? Years since quitting: 42.4  ?Smokeless Tobacco Never   ? ? ?Current Outpatient Medications on File Prior to Visit  ?Medication Sig Dispense Refill  ? Accu-Chek FastClix Lancets MISC USE UP TO FOUR TIMES DAILY AS DIRECTED 102 each 2  ? ACCU-CHEK GUIDE test strip USE UP TO FOUR TIMES DAILY AS DIRECTED 100 each 2  ? allopurinol (ZYLOPRIM) 100 MG tablet TAKE 1 TABLET DAILY 90 tablet 1  ? amLODipine (NORVASC) 10 MG tablet TAKE 1 TABLET EVERY DAY 90 tablet 1  ? aspirin 325 MG tablet Take 325 mg by mouth daily.    ? atorvastatin (LIPITOR) 80 MG tablet TAKE 1 TABLET EVERY DAY 90 tablet 1  ? B Complex Vitamins (VITAMIN-B COMPLEX PO) Take by mouth.    ? BETA CAROTENE PO Take by mouth.    ? blood glucose meter kit and supplies Dispense based on patient and insurance preference. Use up to four times daily as directed. (FOR ICD-10 E10.9, E11.9). 1 each 0  ? Calcium Carbonate-Vitamin D (CALCIUM + D PO) Take by mouth.    ? Continuous Blood Gluc Sensor (FREESTYLE LIBRE 2 SENSOR) MISC by Does not apply route.    ? doxazosin (CARDURA) 4 MG tablet Take 4 mg by mouth daily.    ? Dulaglutide (TRULICITY) 4.5 KV/4.2VZ SOPN Inject 4.5 mg as directed once a week. 6 mL 1  ? empagliflozin (JARDIANCE) 25 MG TABS tablet Take 25 mg by mouth  daily. 90 tablet 3  ? escitalopram (LEXAPRO) 20 MG tablet TAKE 1 TABLET (20 MG) BY MOUTH DAILY 90 tablet 1  ? ezetimibe (ZETIA) 10 MG tablet Take 1 tablet (10 mg total) by mouth daily. 90 tablet 1  ? furosemide (LASIX) 20 MG tablet Take one tablet (20 mg total) by mouth daily. 90 tablet 1  ? gabapentin (NEURONTIN) 400 MG capsule Take 1 capsule (400 mg total) by mouth 3 (three) times daily. 270 capsule 0  ? hydrALAZINE (APRESOLINE) 10 MG tablet TAKE 1 TABLET(10 MG) BY MOUTH IN THE MORNING AND AT BEDTIME 60 tablet 2  ? Insulin Pen Needle (PEN NEEDLES) 32G X 4 MM MISC Use to inject insulin up to 4 times daily 400 each 3  ? losartan (COZAAR) 100 MG tablet TAKE 1 TABLET EVERY DAY 90 tablet 1  ? magnesium 30 MG tablet Take 30 mg by mouth 2 (two) times daily.    ?  metFORMIN (GLUCOPHAGE XR) 500 MG 24 hr tablet Take 1 tablet (500 mg total) by mouth in the morning and at bedtime. 180 tablet 3  ? metoprolol succinate (TOPROL-XL) 100 MG 24 hr tablet TAKE 1 TABLET EVERY DAY 90 tablet 1  ? Multiple Vitamin (MULTIVITAMIN) tablet Take 1 tablet by mouth daily.    ? pantoprazole (PROTONIX) 40 MG tablet Take 1 tablet (40 mg total) by mouth daily. 90 tablet 0  ? sildenafil (VIAGRA) 50 MG tablet Take 1 tablet (50 mg total) by mouth daily as needed for erectile dysfunction. 10 tablet 0  ? Zinc Sulfate (ZINC 15 PO) Take by mouth.    ? potassium chloride SA (KLOR-CON) 20 MEQ tablet Take 2 tablets (40 mEq total) by mouth daily for 3 days. 6 tablet 0  ? ?No current facility-administered medications on file prior to visit.  ? ? ? ?ROS see history of present illness ? ?Objective ? ?Physical Exam ?Vitals:  ? 04/11/21 1556  ?BP: 118/80  ?Pulse: 60  ?Temp: 98.3 ?F (36.8 ?C)  ?SpO2: 96%  ? ? ?BP Readings from Last 3 Encounters:  ?04/11/21 118/80  ?02/02/21 120/80  ?11/15/20 (!) 142/86  ? ?Wt Readings from Last 3 Encounters:  ?04/11/21 178 lb 6.4 oz (80.9 kg)  ?02/02/21 173 lb 12.8 oz (78.8 kg)  ?11/15/20 176 lb 9.6 oz (80.1 kg)  ? ? ?Physical Exam ?Constitutional:   ?   General: He is not in acute distress. ?   Appearance: He is not diaphoretic.  ?Cardiovascular:  ?   Rate and Rhythm: Normal rate and regular rhythm.  ?   Heart sounds: Normal heart sounds.  ?Pulmonary:  ?   Effort: Pulmonary effort is normal.  ?   Breath sounds: Normal breath sounds.  ?Skin: ?   General: Skin is warm and dry.  ?Neurological:  ?   Mental Status: He is alert.  ? ? ? ?Assessment/Plan: Please see individual problem list. ? ?Problem List Items Addressed This Visit   ? ? Hypertension (Chronic)  ?  Adequately controlled.  He will continue amlodipine 10 mg once daily, Cardura 4 mg daily, Lasix 20 mg daily, losartan 100 mg daily, and metoprolol XL 100 mg daily. ?  ?  ? Allergic rhinitis  ?  Patient symptoms are likely related  to allergies.  He will trial Flonase.  If he has any worsening symptoms he will let us know immediately. ?  ?  ? Relevant Medications  ? fluticasone (FLONASE) 50 MCG/ACT nasal spray  ? T2DM (type 2 diabetes mellitus) (  [place]) - Primary  ?  Uncontrolled.  Given his low sugars we will reduce his Basaglar to 20 units once daily.  I have asked that he consistently take Humalog 22 units 3 times daily with meals.  Discussed he needs to take this 10 to 15 minutes before he eats.  I have advised that he should not take any extra insulin between meals as that could be contributing to some of his hypoglycemia as well.  Discussed if he starts to have consistent hypoglycemia he needs to let us know.  Given the difficulty in getting his diabetes under control and with him complying with the advised regimen I am going to refer to endocrinology to get their input on how to manage his diabetes. ?  ?  ? Relevant Medications  ? Insulin Glargine (BASAGLAR KWIKPEN) 100 UNIT/ML  ? insulin lispro (HUMALOG KWIKPEN) 100 UNIT/ML KwikPen  ? Other Relevant Orders  ? Ambulatory referral to Endocrinology  ? ? ? ?Return in about 4 weeks (around 05/09/2021). ? ?This visit occurred during the SARS-CoV-2 public health emergency.  Safety protocols were in place, including screening questions prior to the visit, additional usage of staff PPE, and extensive cleaning of exam room while observing appropriate contact time as indicated for disinfecting solutions.  ? ? ?[name], MD ?[place] ? ?

## 2021-04-12 NOTE — Assessment & Plan Note (Signed)
Patient symptoms are likely related to allergies.  He will trial Flonase.  If he has any worsening symptoms he will let us know immediately. ?

## 2021-04-12 NOTE — Assessment & Plan Note (Signed)
Adequately controlled.  He will continue amlodipine 10 mg once daily, Cardura 4 mg daily, Lasix 20 mg daily, losartan 100 mg daily, and metoprolol XL 100 mg daily. ?

## 2021-04-12 NOTE — Assessment & Plan Note (Signed)
Uncontrolled.  Given his low sugars we will reduce his Basaglar to 20 units once daily.  I have asked that he consistently take Humalog 22 units 3 times daily with meals.  Discussed he needs to take this 10 to 15 minutes before he eats.  I have advised that he should not take any extra insulin between meals as that could be contributing to some of his hypoglycemia as well.  Discussed if he starts to have consistent hypoglycemia he needs to let us know.  Given the difficulty in getting his diabetes under control and with him complying with the advised regimen I am going to refer to endocrinology to get their input on how to manage his diabetes. ?

## 2021-04-18 ENCOUNTER — Telehealth: Payer: Medicare Other

## 2021-04-18 ENCOUNTER — Telehealth: Payer: Self-pay | Admitting: *Deleted

## 2021-04-18 DIAGNOSIS — Z20822 Contact with and (suspected) exposure to covid-19: Secondary | ICD-10-CM | POA: Diagnosis not present

## 2021-04-18 NOTE — Telephone Encounter (Signed)
?  Care Management  ? ?Follow Up Note ? ? ?04/18/2021 ?Name: Shane Jones MRN: [id number] DOB: 01-May-1945 ? ? ?Referred by: Leone Haven, MD ?Reason for referral : Chronic Care Management (DM HTN) ? ? ?An unsuccessful telephone outreach was attempted today. The patient was referred to the case management team for assistance with care management and care coordination.  ? ?Follow Up Plan: RNCM will seek assistance from Care Guides in rescheduling appointment within the next 30 days. ? ?Hubert Azure RN, MSN ?RN Care Management Coordinator ?[facility] ?[phone] ?[email or web address] [email or web address] ? ?

## 2021-04-19 ENCOUNTER — Telehealth: Payer: Self-pay | Admitting: Family Medicine

## 2021-04-19 NOTE — Telephone Encounter (Signed)
Copied from [place] [id number]. Topic: Medicare AWV ?>> Apr 19, 2021  1:26 PM Harris-Coley, Hannah Beat wrote: ?Reason for CRM: Left message for patient to schedule Annual Wellness Visit.  Please schedule with Nurse Health Advisor Denisa O'Brien-Blaney, LPN at [facility].  Please call [phone] ask for Juliann Pulse ?

## 2021-04-20 ENCOUNTER — Telehealth: Payer: Self-pay

## 2021-04-20 NOTE — Chronic Care Management (AMB) (Signed)
?  Care Management  ? ?Note ? ?04/20/2021 ?Name: [name] MRN: [id number] DOB: 10-22-1945 ? ?Shane Jones is a 76 y.o. year old male who is a primary care patient of Leone Haven, MD and is actively engaged with the care management team. I reached out to Irean Hong by phone today to assist with re-scheduling an initial visit with the RN Case Manager ? ?Follow up plan: ?Unsuccessful telephone outreach attempt made. A HIPAA compliant phone message was left for the patient providing contact information and requesting a return call.  ?The care management team will reach out to the patient again over the next 7 days.  ?If patient returns call to provider office, please advise to call [place]  at [phone] ? ?Noreene Larsson, RMA ?Care Guide, Embedded Care Coordination ?[facility]  Care Management  ?[place], [place] [address] ?Direct Dial: [phone] ?[email or web address] ?Website: [email or web address]  ? ?

## 2021-04-21 NOTE — Chronic Care Management (AMB) (Signed)
?  Care Management  ? ?Note ? ?04/21/2021 ?Name: [name] MRN: [id number] DOB: [birth year] ? ?Shane Jones is a 76 y.o. year old male who is a primary care patient of Leone Haven, MD and is actively engaged with the care management team. I reached out to Irean Hong by phone today to assist with re-scheduling an initial visit with the RN Case Manager ? ?Follow up plan: ?Unsuccessful telephone outreach attempt made. A HIPAA compliant phone message was left for the patient providing contact information and requesting a return call.  ?The care management team will reach out to the patient again over the next 7 days.  ?If patient returns call to provider office, please advise to call [place]  at [phone] ? ?Noreene Larsson, RMA ?Care Guide, Embedded Care Coordination ?[facility]  Care Management  ?[place], [place] [address] ?Direct Dial: [phone] ?[email or web address] ?Website: [email or web address]  ? ?

## 2021-04-29 ENCOUNTER — Telehealth: Payer: Self-pay | Admitting: Pharmacy Technician

## 2021-04-29 DIAGNOSIS — Z596 Low income: Secondary | ICD-10-CM

## 2021-04-29 NOTE — Progress Notes (Signed)
[facility] [place])  ?                                          [facility] Quality Pharmacy Team ?  ? ?04/29/2021 ? ?Irean Hong ?Dec 30, 1945 ?[id number] ? ?Unsuccessful outreach call placed to patient in regard to [facility] application for Jardiance. ? ?Unfortunately, patient did not answer the phone. ? ?HIPAA compliant voicemail left for patient to return call. ? ?Was calling patient to inquire about the LIS determination letter from Social security. Embedded PharmD assisted patient with this process on 02/02/21. ? ?Will await patient to send in the required information. BI is requiring this information to process his patient assistance application. ? ?Alexsandra Shontz P. Zeola Brys, CPhT ?[place]  ?[phone] ? ?

## 2021-05-03 NOTE — Chronic Care Management (AMB) (Signed)
?  Chronic Care Management ?Note ? ?05/03/2021 ?Name: Shane Jones MRN: [id number] DOB: 04-18-1945 ? ?Shane Jones is a 76 y.o. year old male who is a primary care patient of Leone Haven, MD and is actively engaged with the care management team. I reached out to Irean Hong by phone today to assist with re-scheduling an initial visit with the RN Case Manager ? ?Follow up plan: ?Telephone appointment with care management team member scheduled for:05/23/2021 ? ?Noreene Larsson, RMA ?Care Guide, Embedded Care Coordination ?Central  Care Management  ?[place], [place] [address] ?Direct Dial: [phone] ?[email or web address] ?Website: [email or web address]  ? ?

## 2021-05-09 ENCOUNTER — Telehealth: Payer: Self-pay

## 2021-05-09 NOTE — Telephone Encounter (Signed)
Patient called to say that the endocrinologist we referred him to is not accepting new patients, and patient said he is supposed to see Dr. Caryl Bis next week.  Please call. ?

## 2021-05-09 NOTE — Telephone Encounter (Signed)
Can we send his referral to [facility]? ?

## 2021-05-13 ENCOUNTER — Encounter: Payer: Self-pay | Admitting: Family Medicine

## 2021-05-16 ENCOUNTER — Ambulatory Visit (INDEPENDENT_AMBULATORY_CARE_PROVIDER_SITE_OTHER): Payer: Medicare Other | Admitting: Family Medicine

## 2021-05-16 ENCOUNTER — Encounter: Payer: Self-pay | Admitting: Family Medicine

## 2021-05-16 DIAGNOSIS — J301 Allergic rhinitis due to pollen: Secondary | ICD-10-CM | POA: Diagnosis not present

## 2021-05-16 DIAGNOSIS — E1165 Type 2 diabetes mellitus with hyperglycemia: Secondary | ICD-10-CM

## 2021-05-16 DIAGNOSIS — Z794 Long term (current) use of insulin: Secondary | ICD-10-CM

## 2021-05-16 MED ORDER — INSULIN LISPRO (1 UNIT DIAL) 100 UNIT/ML (KWIKPEN): 24.0000 [IU] | PEN_INJECTOR | Freq: Two times a day (BID) | SUBCUTANEOUS | 0 refills | Status: DC

## 2021-05-16 NOTE — Progress Notes (Signed)
?[name], MD ?Phone: [phone] ? ?Shane Jones is a 76 y.o. male who presents today for f/u. ? ?DIABETES ?Disease Monitoring: ?Blood Sugar ranges-30 day average of 275 Polyuria/phagia/dipsia- no      Optho- UTD ?Medications: ?Compliance- taking trulicity, jardiance, metformin, basaglar 20 u daily, humalog 22-24 u BID with meals Hypoglycemic symptoms- 4x in the past 30 days ? ?Allergic rhinitis: resolved. Never used the flonase.  ? ? ?Social History  ? ?Tobacco Use  ?Smoking Status Former  ? Types: Cigarettes  ? Quit date: 11/20/1978  ? Years since quitting: 42.5  ?Smokeless Tobacco Never  ? ? ?Current Outpatient Medications on File Prior to Visit  ?Medication Sig Dispense Refill  ? Accu-Chek FastClix Lancets MISC USE UP TO FOUR TIMES DAILY AS DIRECTED 102 each 2  ? ACCU-CHEK GUIDE test strip USE UP TO FOUR TIMES DAILY AS DIRECTED 100 each 2  ? allopurinol (ZYLOPRIM) 100 MG tablet TAKE 1 TABLET DAILY 90 tablet 1  ? amLODipine (NORVASC) 10 MG tablet TAKE 1 TABLET EVERY DAY 90 tablet 1  ? aspirin 325 MG tablet Take 325 mg by mouth daily.    ? atorvastatin (LIPITOR) 80 MG tablet TAKE 1 TABLET EVERY DAY 90 tablet 1  ? B Complex Vitamins (VITAMIN-B COMPLEX PO) Take by mouth.    ? BETA CAROTENE PO Take by mouth.    ? blood glucose meter kit and supplies Dispense based on patient and insurance preference. Use up to four times daily as directed. (FOR ICD-10 E10.9, E11.9). 1 each 0  ? Calcium Carbonate-Vitamin D (CALCIUM + D PO) Take by mouth.    ? Continuous Blood Gluc Sensor (FREESTYLE LIBRE 2 SENSOR) MISC by Does not apply route.    ? doxazosin (CARDURA) 4 MG tablet Take 4 mg by mouth daily.    ? Dulaglutide (TRULICITY) 4.5 OV/2.9VB SOPN Inject 4.5 mg as directed once a week. 6 mL 1  ? empagliflozin (JARDIANCE) 25 MG TABS tablet Take 25 mg by mouth daily. 90 tablet 3  ? escitalopram (LEXAPRO) 20 MG tablet TAKE 1 TABLET (20 MG) BY MOUTH DAILY 90 tablet 1  ? ezetimibe (ZETIA) 10 MG tablet Take 1 tablet (10 mg  total) by mouth daily. 90 tablet 1  ? fluticasone (FLONASE) 50 MCG/ACT nasal spray Place 2 sprays into both nostrils daily. 16 g 6  ? furosemide (LASIX) 20 MG tablet Take one tablet (20 mg total) by mouth daily. 90 tablet 1  ? gabapentin (NEURONTIN) 400 MG capsule Take 1 capsule (400 mg total) by mouth 3 (three) times daily. 270 capsule 0  ? hydrALAZINE (APRESOLINE) 10 MG tablet TAKE 1 TABLET(10 MG) BY MOUTH IN THE MORNING AND AT BEDTIME 60 tablet 2  ? Insulin Glargine (BASAGLAR KWIKPEN) 100 UNIT/ML Inject 20 Units into the skin daily. 15 mL 0  ? Insulin Pen Needle (PEN NEEDLES) 32G X 4 MM MISC Use to inject insulin up to 4 times daily 400 each 3  ? losartan (COZAAR) 100 MG tablet TAKE 1 TABLET EVERY DAY 90 tablet 1  ? magnesium 30 MG tablet Take 30 mg by mouth 2 (two) times daily.    ? metFORMIN (GLUCOPHAGE XR) 500 MG 24 hr tablet Take 1 tablet (500 mg total) by mouth in the morning and at bedtime. 180 tablet 3  ? metoprolol succinate (TOPROL-XL) 100 MG 24 hr tablet TAKE 1 TABLET EVERY DAY 90 tablet 1  ? Multiple Vitamin (MULTIVITAMIN) tablet Take 1 tablet by mouth daily.    ?  pantoprazole (PROTONIX) 40 MG tablet Take 1 tablet (40 mg total) by mouth daily. 90 tablet 0  ? sildenafil (VIAGRA) 50 MG tablet Take 1 tablet (50 mg total) by mouth daily as needed for erectile dysfunction. 10 tablet 0  ? Zinc Sulfate (ZINC 15 PO) Take by mouth.    ? potassium chloride SA (KLOR-CON) 20 MEQ tablet Take 2 tablets (40 mEq total) by mouth daily for 3 days. 6 tablet 0  ? ?No current facility-administered medications on file prior to visit.  ? ? ? ?ROS see history of present illness ? ?Objective ? ?Physical Exam ?Vitals:  ? 05/16/21 1355  ?BP: 125/80  ?Pulse: 63  ?Temp: 99.2 ?F (37.3 ?C)  ?SpO2: 97%  ? ? ?BP Readings from Last 3 Encounters:  ?05/16/21 125/80  ?04/11/21 118/80  ?02/02/21 120/80  ? ?Wt Readings from Last 3 Encounters:  ?05/16/21 178 lb 12.8 oz (81.1 kg)  ?04/11/21 178 lb 6.4 oz (80.9 kg)  ?02/02/21 173 lb 12.8 oz  (78.8 kg)  ? ? ?Physical Exam ?Constitutional:   ?   General: He is not in acute distress. ?   Appearance: He is not diaphoretic.  ?Cardiovascular:  ?   Rate and Rhythm: Normal rate and regular rhythm.  ?   Heart sounds: Normal heart sounds.  ?Pulmonary:  ?   Effort: Pulmonary effort is normal.  ?   Breath sounds: Normal breath sounds.  ?Skin: ?   General: Skin is warm and dry.  ?Neurological:  ?   Mental Status: He is alert.  ? ? ? ?Assessment/Plan: Please see individual problem list. ? ?Problem List Items Addressed This Visit   ? ? T2DM (type 2 diabetes mellitus) ([place]) (Chronic)  ?  Remains uncontrolled.  Discussed continuing Basaglar 20 units daily and using Humalog 24 units twice daily with meals.  He will continue Trulicity 4.5 mg weekly, Jardiance 25 mg daily, and metformin XR 500 mg twice daily.  We will refer him to endocrinology.  He will monitor his sugars and if he starts to have more sugars less than 70 he will let us know. ? ?  ?  ? Relevant Medications  ? insulin lispro (HUMALOG KWIKPEN) 100 UNIT/ML KwikPen  ? Other Relevant Orders  ? Ambulatory referral to Endocrinology  ? Allergic rhinitis  ?  Resolved.  Monitor for recurrence. ? ?  ?  ? ? ? ?Return in about 2 months (around 07/16/2021). ? ? ?[name], MD ?[place] ? ?

## 2021-05-16 NOTE — Patient Instructions (Signed)
Nice to see you. ?We are going to increase your Humalog to 24 units twice daily with meals. ?Please continue Basaglar 20 units daily. ?If you notice increasing frequency of sugars less than 70 please let us know. ?

## 2021-05-16 NOTE — Assessment & Plan Note (Signed)
Resolved. Monitor for recurrence. 

## 2021-05-16 NOTE — Assessment & Plan Note (Signed)
Remains uncontrolled.  Discussed continuing Basaglar 20 units daily and using Humalog 24 units twice daily with meals.  He will continue Trulicity 4.5 mg weekly, Jardiance 25 mg daily, and metformin XR 500 mg twice daily.  We will refer him to endocrinology.  He will monitor his sugars and if he starts to have more sugars less than 70 he will let us know. ?

## 2021-05-23 ENCOUNTER — Ambulatory Visit: Payer: Medicare Other | Admitting: *Deleted

## 2021-05-23 DIAGNOSIS — I10 Essential (primary) hypertension: Secondary | ICD-10-CM

## 2021-05-23 DIAGNOSIS — E1165 Type 2 diabetes mellitus with hyperglycemia: Secondary | ICD-10-CM

## 2021-05-23 NOTE — Patient Instructions (Signed)
Visit Information  Thank you for taking time to visit with me today. Please don't hesitate to contact me if I can be of assistance to you before our next scheduled telephone appointment.  Following are the goals we discussed today:  Take all medications as prescribed Attend all scheduled provider appointments Call provider office for new concerns or questions  schedule appointment with eye doctor check blood sugar at prescribed times: before meals and at bedtime and when you have symptoms of low or high blood sugar check feet daily for cuts, sores or redness fill half of plate with vegetables limit fast food meals to no more than 1 per week check blood pressure daily keep a blood pressure log take blood pressure log to all doctor appointments call doctor for signs and symptoms of high blood pressure keep all doctor appointments report new symptoms to your doctor  Our next appointment is by telephone on 6/28 at 1230  Please call the care guide team at [phone] if you need to cancel or reschedule your appointment.   If you are experiencing a Mental Health or [place] or need someone to talk to, please call the Suicide and Crisis Lifeline: 988 call the [place] National Suicide Prevention Lifeline: (719)653-3264 or TTY: (505)593-1066 TTY (830)455-1321) to talk to a trained counselor call 1-800-273-TALK (toll free, 24 hour hotline) call 911   Following is a copy of your full plan of care:  There are no care plans that you recently modified to display for this patient.   Shane Jones was given information about Care Management services by the embedded care coordination team including:  Care Management services include personalized support from designated clinical staff supervised by his physician, including individualized plan of care and coordination with other care providers 24/7 contact phone numbers for assistance for urgent and routine care needs. The patient may  stop CCM services at any time (effective at the end of the month) by phone call to the office staff.  Patient agreed to services and verbal consent obtained.   The patient verbalized understanding of instructions, educational materials, and care plan provided today and DECLINED offer to receive copy of patient instructions, educational materials, and care plan.   The care management team will reach out to the patient again over the next 45 days.   Shane Azure RN, MSN RN Care Management Coordinator [place] [phone] [email or web address]

## 2021-05-26 NOTE — Chronic Care Management (AMB) (Signed)
Care Management    RN Visit Note  05/26/2021 Name: Shane Jones MRN: [id number] DOB: 1945/05/09  Subjective: Shane Jones is a 76 y.o. year old male who is a primary care patient of Shane Jones, Shane Adam, MD. The care management team was consulted for assistance with disease management and care coordination needs.    Engaged with patient by telephone for initial visit in response to provider referral for case management and/or care coordination services.   Consent to Services:   [name] was given information about Care Management services today including:  Care Management services includes personalized support from designated clinical staff supervised by his physician, including individualized plan of care and coordination with other care providers 24/7 contact phone numbers for assistance for urgent and routine care needs. The patient may stop case management services at any time by phone call to the office staff.  Patient agreed to services and consent obtained.   Assessment: Review of patient past medical history, allergies, medications, health status, including review of consultants reports, laboratory and other test data, was performed as part of comprehensive evaluation and provision of chronic care management services.   SDOH (Social Determinants of Health) assessments and interventions performed:  SDOH Interventions    Flowsheet Row Most Recent Value  SDOH Interventions   Food Insecurity Interventions Intervention Not Indicated  Financial Strain Interventions Other (Comment)  [RECIEVING MEDICATION ASSISTANCE AND STATES UTILITIES ARE [place] Interventions Intervention Not Indicated  Stress Interventions Intervention Not Indicated  Transportation Interventions Intervention Not Indicated, Patient Resources (Friends/Family)        Care Plan  Allergies  Allergen Reactions   Uloric [Febuxostat] Hives    hives    Outpatient Encounter Medications as of 05/23/2021   Medication Sig Note   allopurinol (ZYLOPRIM) 100 MG tablet TAKE 1 TABLET DAILY    amLODipine (NORVASC) 10 MG tablet TAKE 1 TABLET EVERY DAY    aspirin 325 MG tablet Take 325 mg by mouth daily.    atorvastatin (LIPITOR) 80 MG tablet TAKE 1 TABLET EVERY DAY    B Complex Vitamins (VITAMIN-B COMPLEX PO) Take by mouth.    BETA CAROTENE PO Take by mouth.    Calcium Carbonate-Vitamin D (CALCIUM + D PO) Take by mouth.    doxazosin (CARDURA) 4 MG tablet Take 4 mg by mouth daily.    Dulaglutide (TRULICITY) 4.5 GB/1.5VV SOPN Inject 4.5 mg as directed once a week.    empagliflozin (JARDIANCE) 25 MG TABS tablet Take 25 mg by mouth daily.    escitalopram (LEXAPRO) 20 MG tablet TAKE 1 TABLET (20 MG) BY MOUTH DAILY    ezetimibe (ZETIA) 10 MG tablet Take 1 tablet (10 mg total) by mouth daily.    fluticasone (FLONASE) 50 MCG/ACT nasal spray Place 2 sprays into both nostrils daily.    furosemide (LASIX) 20 MG tablet Take one tablet (20 mg total) by mouth daily.    gabapentin (NEURONTIN) 400 MG capsule Take 1 capsule (400 mg total) by mouth 3 (three) times daily. 09/13/2020: Taking PRN   hydrALAZINE (APRESOLINE) 10 MG tablet TAKE 1 TABLET(10 MG) BY MOUTH IN THE MORNING AND AT BEDTIME    Insulin Glargine (BASAGLAR KWIKPEN) 100 UNIT/ML Inject 20 Units into the skin daily.    insulin lispro (HUMALOG KWIKPEN) 100 UNIT/ML KwikPen Inject 24 Units into the skin 2 (two) times daily with a meal.    losartan (COZAAR) 100 MG tablet TAKE 1 TABLET EVERY DAY    magnesium 30 MG tablet  Take 30 mg by mouth 2 (two) times daily.    metFORMIN (GLUCOPHAGE XR) 500 MG 24 hr tablet Take 1 tablet (500 mg total) by mouth in the morning and at bedtime.    metoprolol succinate (TOPROL-XL) 100 MG 24 hr tablet TAKE 1 TABLET EVERY DAY    Multiple Vitamin (MULTIVITAMIN) tablet Take 1 tablet by mouth daily.    pantoprazole (PROTONIX) 40 MG tablet Take 1 tablet (40 mg total) by mouth daily.    sildenafil (VIAGRA) 50 MG tablet Take 1 tablet (50  mg total) by mouth daily as needed for erectile dysfunction.    Zinc Sulfate (ZINC 15 PO) Take by mouth.    Accu-Chek FastClix Lancets MISC USE UP TO FOUR TIMES DAILY AS DIRECTED    ACCU-CHEK GUIDE test strip USE UP TO FOUR TIMES DAILY AS DIRECTED    blood glucose meter kit and supplies Dispense based on patient and insurance preference. Use up to four times daily as directed. (FOR ICD-10 E10.9, E11.9).    Continuous Blood Gluc Sensor (FREESTYLE LIBRE 2 SENSOR) MISC by Does not apply route.    Insulin Pen Needle (PEN NEEDLES) 32G X 4 MM MISC Use to inject insulin up to 4 times daily    potassium chloride SA (KLOR-CON) 20 MEQ tablet Take 2 tablets (40 mEq total) by mouth daily for 3 days. 05/23/2021: REPORTS TAKING 1-2 TABLETS DAILY   No facility-administered encounter medications on file as of 05/23/2021.    Patient Active Problem List   Diagnosis Date Noted   Allergic rhinitis 04/12/2021   Grief 02/02/2021   Urinary urgency 11/15/2020   History of hypokalemia 11/15/2020   Erectile dysfunction 06/29/2020   Tick bite of foot 06/29/2020   Headache 05/13/2020   Overweight 05/30/2019   Benign hypertensive kidney disease with chronic kidney disease 01/22/2019   Secondary hyperparathyroidism of renal origin ([place]) 01/22/2019   Stage 3a chronic kidney disease ([place]) 01/22/2019   Lightheadedness 12/19/2018   Skin lesions 01/21/2018   Esophagitis    Proteinuria 10/10/2017   Onychomycosis 03/19/2017   Anxiety and depression 09/19/2016   Hypokalemia 09/19/2016   Loss of height 05/01/2016   Left shoulder pain 01/31/2016   Sleep apnea 11/22/2011   Chronic back pain 08/17/2011   Hyperlipidemia 08/17/2011   Gout 08/17/2011   T2DM (type 2 diabetes mellitus) ([place]) 02/20/2011   Neuropathy ([place]) 02/17/2011   Hypertension 10/13/2010    Conditions to be addressed/monitored: HTN and DMII  Care Plan : [place] (Adult)  Updates made by Leona Singleton, RN since 05/26/2021 12:00  AM     Problem: KNOWLEDGE DEFICIT RELATED TO SELF CARE MANAGEMENT OF CHRONIC MEDICAL CONDITIONS   Priority: Medium     Long-Range Goal: PATIENT WILL CONTINUE WORKING WITH CCM TEAM TO GAIN KNOWLEDGE AND CONFIDENCE TO CARE FOR CHRONIC CONDITIONS   Start Date: 05/23/2021  Expected End Date: 05/22/2022  Priority: Medium  Note:   Current Barriers:  Chronic Disease Management support and education needs related to HTN and DMII  Financial Constraints   RECEIVING MEDICATION ASSISTANCE 5/22--states he is doing well.  Unsure of blood sugar, needs to change his sensor.  Reports blood pressures have ranged 130/80's.  Denies any chest pain SOB, swelling or dizziness.  RNCM Clinical Goal(s):  Patient will demonstrate Improved adherence to prescribed treatment plan for HTN and DMII as evidenced by MONITORING BLOOD PRESSURE WITH LIMITED HYPERTENSIVE EPISODES AND DECREASING HGB A1C  through collaboration with RN Care manager, provider, and  care team.   Interventions: 1:1 collaboration with primary care provider regarding development and update of comprehensive plan of care as evidenced by provider attestation and co-signature Inter-disciplinary care team collaboration (see longitudinal plan of care) Evaluation of current treatment plan related to  self management and patient's adherence to plan as established by provider  Diabetes Interventions:  (Status:  New goal.) Long Term Goal Assessed patient's understanding of A1c goal: <7% Provided education to patient about basic DM disease process Reviewed medications with patient and discussed importance of medication adherence Counseled on importance of regular laboratory monitoring as prescribed Discussed plans with patient for ongoing care management follow up and provided patient with direct contact information for care management team Advised patient, providing education and rationale, to check cbg 3 and record, calling PCP for findings outside  established parameters Screening for signs and symptoms of depression related to chronic disease state  Assessed social determinant of health barriers Lab Results  Component Value Date   HGBA1C 8.9 (H) 02/02/2021   Hypertension Interventions:  (Status:  New goal.) Long Term Goal Last practice recorded BP readings:  BP Readings from Last 3 Encounters:  05/16/21 125/80  04/11/21 118/80  02/02/21 120/80  Most recent eGFR/CrCl: No results found for: EGFR  No components found for: CRCL  Evaluation of current treatment plan related to hypertension self management and patient's adherence to plan as established by provider Reviewed medications with patient and discussed importance of compliance Advised patient, providing education and rationale, to monitor blood pressure daily and record, calling PCP for findings outside established parameters Provided education on prescribed diet LOW SALT, LOW FAT, CARBOHYDRATE MODIFIED Assessed social determinant of health barriers  Patient Goals/Self-Care Activities: Take all medications as prescribed Attend all scheduled provider appointments Call provider office for new concerns or questions  schedule appointment with eye doctor check blood sugar at prescribed times: before meals and at bedtime and when you have symptoms of low or high blood sugar check feet daily for cuts, sores or redness fill half of plate with vegetables limit fast food meals to no more than 1 per week check blood pressure daily keep a blood pressure log take blood pressure log to all doctor appointments call doctor for signs and symptoms of high blood pressure keep all doctor appointments report new symptoms to your doctor  Follow Up Plan:  The care management team will reach out to the patient again over the next 45 days.      Plan: The care management team will reach out to the patient again over the next 45 days.  Hubert Azure RN, MSN RN Care Management  Coordinator [place] [phone] [email or web address]

## 2021-06-06 ENCOUNTER — Telehealth: Payer: Self-pay | Admitting: Family Medicine

## 2021-06-06 NOTE — Telephone Encounter (Signed)
Copied from [place] [phone]. Topic: Medicare AWV >> Jun 06, 2021 10:40 AM Harris-Coley, Hannah Beat wrote: Reason for CRM: Left message for patient to schedule Annual Wellness Visit.  Please schedule with Nurse Health Advisor Denisa O'Brien-Blaney, LPN at [facility].  Please call [phone] ask for [facility]

## 2021-06-29 ENCOUNTER — Ambulatory Visit: Payer: Medicare Other | Admitting: *Deleted

## 2021-06-29 DIAGNOSIS — E1165 Type 2 diabetes mellitus with hyperglycemia: Secondary | ICD-10-CM

## 2021-06-29 DIAGNOSIS — Z794 Long term (current) use of insulin: Secondary | ICD-10-CM

## 2021-06-29 DIAGNOSIS — I10 Essential (primary) hypertension: Secondary | ICD-10-CM

## 2021-06-29 NOTE — Chronic Care Management (AMB) (Signed)
Care Management    RN Visit Note  06/29/2021 Name: Shane Jones MRN: [id number] DOB: Dec 03, 1945  Subjective: Shane Jones is a 76 y.o. year old male who is a primary care patient of Caryl Bis, Angela Adam, MD. The care management team was consulted for assistance with disease management and care coordination needs.    Engaged with patient by telephone for follow up visit in response to provider referral for case management and/or care coordination services.   Consent to Services:   Shane Jones was given information about Care Management services today including:  Care Management services includes personalized support from designated clinical staff supervised by his physician, including individualized plan of care and coordination with other care providers 24/7 contact phone numbers for assistance for urgent and routine care needs. The patient may stop case management services at any time by phone call to the office staff.  Patient agreed to services and consent obtained.   Assessment: Review of patient past medical history, allergies, medications, health status, including review of consultants reports, laboratory and other test data, was performed as part of comprehensive evaluation and provision of chronic care management services.   SDOH (Social Determinants of Health) assessments and interventions performed:    Care Plan  Allergies  Allergen Reactions   Uloric [Febuxostat] Hives    hives    Outpatient Encounter Medications as of 06/29/2021  Medication Sig Note   Accu-Chek FastClix Lancets MISC USE UP TO FOUR TIMES DAILY AS DIRECTED    ACCU-CHEK GUIDE test strip USE UP TO FOUR TIMES DAILY AS DIRECTED    allopurinol (ZYLOPRIM) 100 MG tablet TAKE 1 TABLET DAILY    amLODipine (NORVASC) 10 MG tablet TAKE 1 TABLET EVERY DAY    aspirin 325 MG tablet Take 325 mg by mouth daily.    atorvastatin (LIPITOR) 80 MG tablet TAKE 1 TABLET EVERY DAY    B Complex Vitamins (VITAMIN-B COMPLEX PO)  Take by mouth.    BETA CAROTENE PO Take by mouth.    blood glucose meter kit and supplies Dispense based on patient and insurance preference. Use up to four times daily as directed. (FOR ICD-10 E10.9, E11.9).    Calcium Carbonate-Vitamin D (CALCIUM + D PO) Take by mouth.    Continuous Blood Gluc Sensor (FREESTYLE LIBRE 2 SENSOR) MISC by Does not apply route.    doxazosin (CARDURA) 4 MG tablet Take 4 mg by mouth daily.    Dulaglutide (TRULICITY) 4.5 YH/0.6CB SOPN Inject 4.5 mg as directed once a week.    empagliflozin (JARDIANCE) 25 MG TABS tablet Take 25 mg by mouth daily.    escitalopram (LEXAPRO) 20 MG tablet TAKE 1 TABLET (20 MG) BY MOUTH DAILY    ezetimibe (ZETIA) 10 MG tablet Take 1 tablet (10 mg total) by mouth daily.    fluticasone (FLONASE) 50 MCG/ACT nasal spray Place 2 sprays into both nostrils daily.    furosemide (LASIX) 20 MG tablet Take one tablet (20 mg total) by mouth daily.    gabapentin (NEURONTIN) 400 MG capsule Take 1 capsule (400 mg total) by mouth 3 (three) times daily. 09/13/2020: Taking PRN   hydrALAZINE (APRESOLINE) 10 MG tablet TAKE 1 TABLET(10 MG) BY MOUTH IN THE MORNING AND AT BEDTIME    Insulin Glargine (BASAGLAR KWIKPEN) 100 UNIT/ML Inject 20 Units into the skin daily.    insulin lispro (HUMALOG KWIKPEN) 100 UNIT/ML KwikPen Inject 24 Units into the skin 2 (two) times daily with a meal.    Insulin Pen Needle (  PEN NEEDLES) 32G X 4 MM MISC Use to inject insulin up to 4 times daily    losartan (COZAAR) 100 MG tablet TAKE 1 TABLET EVERY DAY    magnesium 30 MG tablet Take 30 mg by mouth 2 (two) times daily.    metFORMIN (GLUCOPHAGE XR) 500 MG 24 hr tablet Take 1 tablet (500 mg total) by mouth in the morning and at bedtime.    metoprolol succinate (TOPROL-XL) 100 MG 24 hr tablet TAKE 1 TABLET EVERY DAY    Multiple Vitamin (MULTIVITAMIN) tablet Take 1 tablet by mouth daily.    pantoprazole (PROTONIX) 40 MG tablet Take 1 tablet (40 mg total) by mouth daily.    potassium  chloride SA (KLOR-CON) 20 MEQ tablet Take 2 tablets (40 mEq total) by mouth daily for 3 days. 05/23/2021: REPORTS TAKING 1-2 TABLETS DAILY   sildenafil (VIAGRA) 50 MG tablet Take 1 tablet (50 mg total) by mouth daily as needed for erectile dysfunction.    Zinc Sulfate (ZINC 15 PO) Take by mouth.    No facility-administered encounter medications on file as of 06/29/2021.    Patient Active Problem List   Diagnosis Date Noted   Allergic rhinitis 04/12/2021   Grief 02/02/2021   Urinary urgency 11/15/2020   History of hypokalemia 11/15/2020   Erectile dysfunction 06/29/2020   Tick bite of foot 06/29/2020   Headache 05/13/2020   Overweight 05/30/2019   Benign hypertensive kidney disease with chronic kidney disease 01/22/2019   Secondary hyperparathyroidism of renal origin ([place]) 01/22/2019   Stage 3a chronic kidney disease ([place]) 01/22/2019   Lightheadedness 12/19/2018   Skin lesions 01/21/2018   Esophagitis    Proteinuria 10/10/2017   Onychomycosis 03/19/2017   Anxiety and depression 09/19/2016   Hypokalemia 09/19/2016   Loss of height 05/01/2016   Left shoulder pain 01/31/2016   Sleep apnea 11/22/2011   Chronic back pain 08/17/2011   Hyperlipidemia 08/17/2011   Gout 08/17/2011   T2DM (type 2 diabetes mellitus) ([place]) 02/20/2011   Neuropathy ([place]) 02/17/2011   Hypertension 10/13/2010    Conditions to be addressed/monitored: HTN and DMII  Care Plan : [place] (Adult)  Updates made by Leona Singleton, RN since 06/29/2021 12:00 AM     Problem: KNOWLEDGE DEFICIT RELATED TO SELF CARE MANAGEMENT OF CHRONIC MEDICAL CONDITIONS   Priority: Medium     Long-Range Goal: PATIENT WILL CONTINUE WORKING WITH CCM TEAM TO GAIN KNOWLEDGE AND CONFIDENCE TO CARE FOR CHRONIC CONDITIONS   Start Date: 05/23/2021  Expected End Date: 05/22/2022  Priority: Medium  Note:   Current Barriers:  Chronic Disease Management support and education needs related to HTN and DMII  Financial  Constraints   RECEIVING MEDICATION ASSISTANCE 5/22--states he is doing well.  Unsure of blood sugar, needs to change his sensor.  Reports blood pressures have ranged 130/80's.  Denies any chest pain SOB, swelling or dizziness. 6/28--reports he is doing good.  Fasting blood sugar yesterday was 121 with recent ranges below 200.  States he has been monitoring BP but doesn't remember readings, just systolic has been 373-428'J.  Denies any pain, SOB, swelling, or dizziness  RNCM Clinical Goal(s):  Patient will demonstrate Improved adherence to prescribed treatment plan for HTN and DMII as evidenced by MONITORING BLOOD PRESSURE WITH LIMITED HYPERTENSIVE EPISODES AND DECREASING HGB A1C  through collaboration with RN Care manager, provider, and care team.   Interventions: 1:1 collaboration with primary care provider regarding development and update of comprehensive plan of care  as evidenced by provider attestation and co-signature Inter-disciplinary care team collaboration (see longitudinal plan of care) Evaluation of current treatment plan related to  self management and patient's adherence to plan as established by provider  Diabetes Interventions:  (Status:  Goal on track:  Yes.) Long Term Goal Assessed patient's understanding of A1c goal: <7% Provided education to patient about basic DM disease process Reviewed medications with patient and discussed importance of medication adherence Counseled on importance of regular laboratory monitoring as prescribed Discussed plans with patient for ongoing care management follow up and provided patient with direct contact information for care management team Advised patient, providing education and rationale, to check cbg 3 and record, calling PCP for findings outside established parameters Lab Results  Component Value Date   HGBA1C 8.9 (H) 02/02/2021   Hypertension Interventions:  (Status:  Goal on track:  Yes.) Long Term Goal Last practice recorded BP  readings:  BP Readings from Last 3 Encounters:  05/16/21 125/80  04/11/21 118/80  02/02/21 120/80  Most recent eGFR/CrCl: No results found for: "EGFR"  No components found for: "CRCL"  Evaluation of current treatment plan related to hypertension self management and patient's adherence to plan as established by provider Reviewed medications with patient and discussed importance of compliance Advised patient, providing education and rationale, to monitor blood pressure daily and record, calling PCP for findings outside established parameters Provided education on prescribed diet LOW SALT, LOW FAT, CARBOHYDRATE MODIFIED Encouraged to increase activity as tolerated Encouraged to record BP readings for provider review  Patient Goals/Self-Care Activities: Take all medications as prescribed Attend all scheduled provider appointments Call provider office for new concerns or questions  schedule appointment with eye doctor check blood sugar at prescribed times: before meals and at bedtime and when you have symptoms of low or high blood sugar check feet daily for cuts, sores or redness fill half of plate with vegetables limit fast food meals to no more than 1 per week check blood pressure daily keep a blood pressure log take blood pressure log to all doctor appointments call doctor for signs and symptoms of high blood pressure keep all doctor appointments report new symptoms to your doctor  Follow Up Plan:  The care management team will reach out to the patient again over the next 45 days.      Plan: The care management team will reach out to the patient again over the next 45 days.  Hubert Azure RN, MSN RN Care Management Coordinator [place] [phone] [email or web address]

## 2021-06-29 NOTE — Patient Instructions (Addendum)
Visit Information  Thank you for taking time to visit with me today. Please don't hesitate to contact me if I can be of assistance to you before our next scheduled telephone appointment.  Following are the goals we discussed today:  Take all medications as prescribed Attend all scheduled provider appointments Call provider office for new concerns or questions  schedule appointment with eye doctor check blood sugar at prescribed times: before meals and at bedtime and when you have symptoms of low or high blood sugar check feet daily for cuts, sores or redness fill half of plate with vegetables limit fast food meals to no more than 1 per week check blood pressure daily keep a blood pressure log take blood pressure log to all doctor appointments call doctor for signs and symptoms of high blood pressure keep all doctor appointments report new symptoms to your doctor  Our next appointment is by telephone on 7/28 at 1230  Please call the care guide team at [phone] if you need to cancel or reschedule your appointment.   If you are experiencing a Mental Health or [place] or need someone to talk to, please call the Suicide and Crisis Lifeline: 988 call the [place] National Suicide Prevention Lifeline: 519-637-0058 or TTY: 252 494 4349 TTY 831-729-0986) to talk to a trained counselor call 1-800-273-TALK (toll free, 24 hour hotline) call 911   The patient verbalized understanding of instructions, educational materials, and care plan provided today and DECLINED offer to receive copy of patient instructions, educational materials, and care plan.   The care management team will reach out to the patient again over the next 45 days.   Hubert Azure RN, MSN RN Care Management Coordinator [place] [phone] [email or web address]

## 2021-07-14 ENCOUNTER — Telehealth: Payer: Self-pay

## 2021-07-14 NOTE — Telephone Encounter (Signed)
I called and LVM informing the patient that Dr. Jenel Lucks office at [facility] had made several attempts to reach him to schedule a visit, I gave him the number to call and schedule [phone].  Kaitlan Bin,cma

## 2021-07-20 ENCOUNTER — Ambulatory Visit: Payer: Medicare Other | Admitting: Family Medicine

## 2021-07-25 ENCOUNTER — Ambulatory Visit (INDEPENDENT_AMBULATORY_CARE_PROVIDER_SITE_OTHER): Payer: Medicare Other | Admitting: Family Medicine

## 2021-07-25 ENCOUNTER — Encounter: Payer: Self-pay | Admitting: Family Medicine

## 2021-07-25 VITALS — BP 138/80 | HR 69 | Temp 98.1°F | Ht 61.0 in | Wt 176.4 lb

## 2021-07-25 DIAGNOSIS — M1A9XX1 Chronic gout, unspecified, with tophus (tophi): Secondary | ICD-10-CM

## 2021-07-25 DIAGNOSIS — I10 Essential (primary) hypertension: Secondary | ICD-10-CM | POA: Diagnosis not present

## 2021-07-25 DIAGNOSIS — E1165 Type 2 diabetes mellitus with hyperglycemia: Secondary | ICD-10-CM | POA: Diagnosis not present

## 2021-07-25 DIAGNOSIS — Z794 Long term (current) use of insulin: Secondary | ICD-10-CM | POA: Diagnosis not present

## 2021-07-25 DIAGNOSIS — H259 Unspecified age-related cataract: Secondary | ICD-10-CM

## 2021-07-25 DIAGNOSIS — H269 Unspecified cataract: Secondary | ICD-10-CM | POA: Insufficient documentation

## 2021-07-25 DIAGNOSIS — J301 Allergic rhinitis due to pollen: Secondary | ICD-10-CM

## 2021-07-25 NOTE — Assessment & Plan Note (Signed)
Patient will trial Flonase

## 2021-07-25 NOTE — Assessment & Plan Note (Signed)
Uncontrolled.  He will return for an A1c.  I discussed that he is likely contributing to his sugars going elevated by not taking his Humalog with his meals in the evening.  Discussed that he needs to take his Humalog unless his sugar is less than 100 prior to eating his evening meal.  Discussed taking Humalog without eating later in the evening after sugar is high may be contributing to his sudden drops in sugar overnight.  Discussed that he needs to take the Basaglar 20 units once daily.  He needs to take the Humalog 20 units twice daily with a meal.  If he does not eat a meal he does not need to take the Humalog at that time of day.  He will see endocrinology as planned.  If he continues to have low sugars despite sticking to his regimen as prescribed he will let us know.

## 2021-07-25 NOTE — Assessment & Plan Note (Signed)
Adequately controlled at home.  He will continue amlodipine 10 mg once daily, doxazosin 4 mg daily, Lasix 20 mg daily, hydralazine 10 mg twice daily, metoprolol 100 mg daily, and losartan 100 mg daily

## 2021-07-25 NOTE — Assessment & Plan Note (Signed)
He will continue to see ophthalmology.

## 2021-07-25 NOTE — Patient Instructions (Signed)
Nice to see you. You need to make sure that anytime you eat and your sugar is above 100 before eating that you take your Humalog.  If you start running low while doing this please let us know. If you are not going to eat a meal you do not need to take your Humalog. We will have you return for your labs.

## 2021-07-25 NOTE — Assessment & Plan Note (Signed)
Continue allopurinol 100 mg daily.  We will check lab work as outlined.

## 2021-07-25 NOTE — Progress Notes (Signed)
[name], MD Phone: [phone]  Shane Jones is a 76 y.o. male who presents today for follow-up.  Diabetes: Patient's sugars are up to the 170s at the highest per his report though on review of his CGM data it looks like he does jump up into the 300s at times.  He is on Trulicity, Jardiance, Basaglar, and Humalog as well as metformin.  He notes he does not take his Humalog dose if his sugar is less than 150 prior to eating.  He notes when his sugar goes up in the evening he will oftentimes take Humalog 10 units to try to get his sugar down.  He does note his sugar suddenly plummets at times particularly overnight.  He notes no hypoglycemic symptoms when this occurs.  He does see endocrinology in September.  Gout: Patient takes allopurinol.  No recent flares.  Hypertension: Patient reports his systolic blood pressures have been typically less than 130.  Allergic rhinitis: Patient notes he did not try the Flonase.  He continues to have some nasal droopiness.  Cataracts: He is trying to get his cataracts removed in the near future.  Social History   Tobacco Use  Smoking Status Former   Types: Cigarettes   Quit date: 11/20/1978   Years since quitting: 42.7  Smokeless Tobacco Never    Current Outpatient Medications on File Prior to Visit  Medication Sig Dispense Refill   Accu-Chek FastClix Lancets MISC USE UP TO FOUR TIMES DAILY AS DIRECTED 102 each 2   ACCU-CHEK GUIDE test strip USE UP TO FOUR TIMES DAILY AS DIRECTED 100 each 2   allopurinol (ZYLOPRIM) 100 MG tablet TAKE 1 TABLET DAILY 90 tablet 1   amLODipine (NORVASC) 10 MG tablet TAKE 1 TABLET EVERY DAY 90 tablet 1   aspirin 325 MG tablet Take 325 mg by mouth daily.     atorvastatin (LIPITOR) 80 MG tablet TAKE 1 TABLET EVERY DAY 90 tablet 1   B Complex Vitamins (VITAMIN-B COMPLEX PO) Take by mouth.     BETA CAROTENE PO Take by mouth.     blood glucose meter kit and supplies Dispense based on patient and insurance  preference. Use up to four times daily as directed. (FOR ICD-10 E10.9, E11.9). 1 each 0   Calcium Carbonate-Vitamin D (CALCIUM + D PO) Take by mouth.     Continuous Blood Gluc Sensor (FREESTYLE LIBRE 2 SENSOR) MISC by Does not apply route.     doxazosin (CARDURA) 4 MG tablet Take 4 mg by mouth daily.     Dulaglutide (TRULICITY) 4.5 NI/6.2VO SOPN Inject 4.5 mg as directed once a week. 6 mL 1   empagliflozin (JARDIANCE) 25 MG TABS tablet Take 25 mg by mouth daily. 90 tablet 3   escitalopram (LEXAPRO) 20 MG tablet TAKE 1 TABLET (20 MG) BY MOUTH DAILY 90 tablet 1   ezetimibe (ZETIA) 10 MG tablet Take 1 tablet (10 mg total) by mouth daily. 90 tablet 1   fluticasone (FLONASE) 50 MCG/ACT nasal spray Place 2 sprays into both nostrils daily. 16 g 6   furosemide (LASIX) 20 MG tablet Take one tablet (20 mg total) by mouth daily. 90 tablet 1   gabapentin (NEURONTIN) 400 MG capsule Take 1 capsule (400 mg total) by mouth 3 (three) times daily. 270 capsule 0   hydrALAZINE (APRESOLINE) 10 MG tablet TAKE 1 TABLET(10 MG) BY MOUTH IN THE MORNING AND AT BEDTIME 60 tablet 2   Insulin Glargine (BASAGLAR KWIKPEN) 100 UNIT/ML Inject 20 Units into the  skin daily. 15 mL 0   insulin lispro (HUMALOG KWIKPEN) 100 UNIT/ML KwikPen Inject 24 Units into the skin 2 (two) times daily with a meal. 3 mL 0   Insulin Pen Needle (PEN NEEDLES) 32G X 4 MM MISC Use to inject insulin up to 4 times daily 400 each 3   losartan (COZAAR) 100 MG tablet TAKE 1 TABLET EVERY DAY 90 tablet 1   magnesium 30 MG tablet Take 30 mg by mouth 2 (two) times daily.     metFORMIN (GLUCOPHAGE XR) 500 MG 24 hr tablet Take 1 tablet (500 mg total) by mouth in the morning and at bedtime. 180 tablet 3   metoprolol succinate (TOPROL-XL) 100 MG 24 hr tablet TAKE 1 TABLET EVERY DAY 90 tablet 1   Multiple Vitamin (MULTIVITAMIN) tablet Take 1 tablet by mouth daily.     pantoprazole (PROTONIX) 40 MG tablet Take 1 tablet (40 mg total) by mouth daily. 90 tablet 0    potassium chloride SA (KLOR-CON) 20 MEQ tablet Take 2 tablets (40 mEq total) by mouth daily for 3 days. 6 tablet 0   sildenafil (VIAGRA) 50 MG tablet Take 1 tablet (50 mg total) by mouth daily as needed for erectile dysfunction. 10 tablet 0   Zinc Sulfate (ZINC 15 PO) Take by mouth.     No current facility-administered medications on file prior to visit.     ROS see history of present illness  Objective  Physical Exam Vitals:   07/25/21 1427  BP: 138/80  Pulse: 69  Temp: 98.1 F (36.7 C)  SpO2: 98%    BP Readings from Last 3 Encounters:  07/25/21 138/80  05/16/21 125/80  04/11/21 118/80   Wt Readings from Last 3 Encounters:  07/25/21 176 lb 6.4 oz (80 kg)  05/16/21 178 lb 12.8 oz (81.1 kg)  04/11/21 178 lb 6.4 oz (80.9 kg)    Physical Exam Constitutional:      General: He is not in acute distress.    Appearance: He is not diaphoretic.  Cardiovascular:     Rate and Rhythm: Normal rate and regular rhythm.     Heart sounds: Normal heart sounds.  Pulmonary:     Effort: Pulmonary effort is normal.     Breath sounds: Normal breath sounds.  Skin:    General: Skin is warm and dry.  Neurological:     Mental Status: He is alert.      Assessment/Plan: Please see individual problem list.  Problem List Items Addressed This Visit     Cataracts, bilateral (Chronic)    He will continue to see ophthalmology.      Gout (Chronic)    Continue allopurinol 100 mg daily.  We will check lab work as outlined.      Relevant Orders   Uric acid   CBC w/Diff   Basic Metabolic Panel (BMET)   Hypertension (Chronic)    Adequately controlled at home.  He will continue amlodipine 10 mg once daily, doxazosin 4 mg daily, Lasix 20 mg daily, hydralazine 10 mg twice daily, metoprolol 100 mg daily, and losartan 100 mg daily      T2DM (type 2 diabetes mellitus) ([place]) - Primary (Chronic)    Uncontrolled.  He will return for an A1c.  I discussed that he is likely contributing to his  sugars going elevated by not taking his Humalog with his meals in the evening.  Discussed that he needs to take his Humalog unless his sugar is less than 100 prior to  eating his evening meal.  Discussed taking Humalog without eating later in the evening after sugar is high may be contributing to his sudden drops in sugar overnight.  Discussed that he needs to take the Basaglar 20 units once daily.  He needs to take the Humalog 20 units twice daily with a meal.  If he does not eat a meal he does not need to take the Humalog at that time of day.  He will see endocrinology as planned.  If he continues to have low sugars despite sticking to his regimen as prescribed he will let us know.      Relevant Orders   HgB A1c   Allergic rhinitis    Patient will trial Flonase        Return in about 2 weeks (around 08/08/2021) for Labs, 4 months PCP follow-up.   [name], MD [place]

## 2021-07-29 ENCOUNTER — Ambulatory Visit (INDEPENDENT_AMBULATORY_CARE_PROVIDER_SITE_OTHER): Payer: Medicare Other | Admitting: *Deleted

## 2021-07-29 DIAGNOSIS — I10 Essential (primary) hypertension: Secondary | ICD-10-CM

## 2021-07-29 DIAGNOSIS — E1165 Type 2 diabetes mellitus with hyperglycemia: Secondary | ICD-10-CM

## 2021-07-29 NOTE — Chronic Care Management (AMB) (Signed)
Care Management    RN Visit Note  07/29/2021 Name: BELL CAI MRN: [id number] DOB: [birth year]  Subjective: Shane Jones is a 76 y.o. year old male who is a primary care patient of Caryl Bis, Angela Adam, MD. The care management team was consulted for assistance with disease management and care coordination needs.    Engaged with patient by telephone for follow up visit in response to provider referral for case management and/or care coordination services.   Consent to Services:   Mr. Stegmann was given information about Care Management services today including:  Care Management services includes personalized support from designated clinical staff supervised by his physician, including individualized plan of care and coordination with other care providers 24/7 contact phone numbers for assistance for urgent and routine care needs. The patient may stop case management services at any time by phone call to the office staff.  Patient agreed to services and consent obtained.   Assessment: Review of patient past medical history, allergies, medications, health status, including review of consultants reports, laboratory and other test data, was performed as part of comprehensive evaluation and provision of chronic care management services.   SDOH (Social Determinants of Health) assessments and interventions performed:    Care Plan  Allergies  Allergen Reactions   Uloric [Febuxostat] Hives    hives    Outpatient Encounter Medications as of 07/29/2021  Medication Sig Note   Accu-Chek FastClix Lancets MISC USE UP TO FOUR TIMES DAILY AS DIRECTED    ACCU-CHEK GUIDE test strip USE UP TO FOUR TIMES DAILY AS DIRECTED    allopurinol (ZYLOPRIM) 100 MG tablet TAKE 1 TABLET DAILY    amLODipine (NORVASC) 10 MG tablet TAKE 1 TABLET EVERY DAY    aspirin 325 MG tablet Take 325 mg by mouth daily.    atorvastatin (LIPITOR) 80 MG tablet TAKE 1 TABLET EVERY DAY    B Complex Vitamins (VITAMIN-B COMPLEX PO)  Take by mouth.    BETA CAROTENE PO Take by mouth.    blood glucose meter kit and supplies Dispense based on patient and insurance preference. Use up to four times daily as directed. (FOR ICD-10 E10.9, E11.9).    Calcium Carbonate-Vitamin D (CALCIUM + D PO) Take by mouth.    Continuous Blood Gluc Sensor (FREESTYLE LIBRE 2 SENSOR) MISC by Does not apply route.    doxazosin (CARDURA) 4 MG tablet Take 4 mg by mouth daily.    Dulaglutide (TRULICITY) 4.5 XB/2.6OM SOPN Inject 4.5 mg as directed once a week.    empagliflozin (JARDIANCE) 25 MG TABS tablet Take 25 mg by mouth daily.    escitalopram (LEXAPRO) 20 MG tablet TAKE 1 TABLET (20 MG) BY MOUTH DAILY    ezetimibe (ZETIA) 10 MG tablet Take 1 tablet (10 mg total) by mouth daily.    fluticasone (FLONASE) 50 MCG/ACT nasal spray Place 2 sprays into both nostrils daily.    furosemide (LASIX) 20 MG tablet Take one tablet (20 mg total) by mouth daily.    gabapentin (NEURONTIN) 400 MG capsule Take 1 capsule (400 mg total) by mouth 3 (three) times daily. 09/13/2020: Taking PRN   hydrALAZINE (APRESOLINE) 10 MG tablet TAKE 1 TABLET(10 MG) BY MOUTH IN THE MORNING AND AT BEDTIME    Insulin Glargine (BASAGLAR KWIKPEN) 100 UNIT/ML Inject 20 Units into the skin daily.    insulin lispro (HUMALOG KWIKPEN) 100 UNIT/ML KwikPen Inject 24 Units into the skin 2 (two) times daily with a meal.    Insulin Pen Needle (  PEN NEEDLES) 32G X 4 MM MISC Use to inject insulin up to 4 times daily    losartan (COZAAR) 100 MG tablet TAKE 1 TABLET EVERY DAY    magnesium 30 MG tablet Take 30 mg by mouth 2 (two) times daily.    metFORMIN (GLUCOPHAGE XR) 500 MG 24 hr tablet Take 1 tablet (500 mg total) by mouth in the morning and at bedtime.    metoprolol succinate (TOPROL-XL) 100 MG 24 hr tablet TAKE 1 TABLET EVERY DAY    Multiple Vitamin (MULTIVITAMIN) tablet Take 1 tablet by mouth daily.    pantoprazole (PROTONIX) 40 MG tablet Take 1 tablet (40 mg total) by mouth daily.    potassium  chloride SA (KLOR-CON) 20 MEQ tablet Take 2 tablets (40 mEq total) by mouth daily for 3 days. 05/23/2021: REPORTS TAKING 1-2 TABLETS DAILY   sildenafil (VIAGRA) 50 MG tablet Take 1 tablet (50 mg total) by mouth daily as needed for erectile dysfunction.    Zinc Sulfate (ZINC 15 PO) Take by mouth.    No facility-administered encounter medications on file as of 07/29/2021.    Patient Active Problem List   Diagnosis Date Noted   Cataracts, bilateral 07/25/2021   Allergic rhinitis 04/12/2021   Grief 02/02/2021   Urinary urgency 11/15/2020   History of hypokalemia 11/15/2020   Erectile dysfunction 06/29/2020   Tick bite of foot 06/29/2020   Headache 05/13/2020   Overweight 05/30/2019   Benign hypertensive kidney disease with chronic kidney disease 01/22/2019   Secondary hyperparathyroidism of renal origin ([place]) 01/22/2019   Stage 3a chronic kidney disease ([place]) 01/22/2019   Lightheadedness 12/19/2018   Skin lesions 01/21/2018   Esophagitis    Proteinuria 10/10/2017   Onychomycosis 03/19/2017   Anxiety and depression 09/19/2016   Hypokalemia 09/19/2016   Loss of height 05/01/2016   Left shoulder pain 01/31/2016   Sleep apnea 11/22/2011   Chronic back pain 08/17/2011   Hyperlipidemia 08/17/2011   Gout 08/17/2011   T2DM (type 2 diabetes mellitus) ([place]) 02/20/2011   Neuropathy ([place]) 02/17/2011   Hypertension 10/13/2010    Conditions to be addressed/monitored: CHF, HTN, Hypertriglyceridemia, and DMII  Care Plan : RNCM  General Plan of Care (Adult)      RESOLVING DUE TO DUPLICATE GOALS/TRANSITIONING TO CARE COORDINATION  Updates made by Leona Singleton, RN since 07/29/2021 12:00 AM  Completed 07/29/2021   Problem: KNOWLEDGE DEFICIT RELATED TO SELF CARE MANAGEMENT OF CHRONIC MEDICAL CONDITIONS Resolved 07/29/2021  Priority: Medium  Note:   RESOLVING DUE TO DUPLICATE GOALS/TRANSITIONING TO CARE COORDINATION    Long-Range Goal: PATIENT WILL CONTINUE WORKING WITH CCM TEAM TO GAIN  KNOWLEDGE AND CONFIDENCE TO CARE FOR CHRONIC CONDITIONS Completed 07/29/2021  Start Date: 05/23/2021  Expected End Date: 05/22/2022  Priority: Medium  Note:   RESOLVING DUE TO DUPLICATE GOALS/TRANSITIONING TO CARE COORDINATION  7/28-STATES HIS BLOOD SUGARS AND BLOOD PRESSURES HAVE BEEN GOOD BUT DOES NOT REMEMBER NUMBERS.  ACKNOWLEDGES GOAL IS TO REDUCE A1C   Current Barriers:  Chronic Disease Management support and education needs related to HTN and DMII  Financial Constraints   RECEIVING MEDICATION ASSISTANCE 5/22--states he is doing well.  Unsure of blood sugar, needs to change his sensor.  Reports blood pressures have ranged 130/80's.  Denies any chest pain SOB, swelling or dizziness. 6/28--reports he is doing good.  Fasting blood sugar yesterday was 121 with recent ranges below 200.  States he has been monitoring BP but doesn't remember readings, just systolic has been 009-233'A.  Denies  any pain, SOB, swelling, or dizziness  RNCM Clinical Goal(s):  Patient will demonstrate Improved adherence to prescribed treatment plan for HTN and DMII as evidenced by MONITORING BLOOD PRESSURE WITH LIMITED HYPERTENSIVE EPISODES AND DECREASING HGB A1C  through collaboration with RN Care manager, provider, and care team.   Interventions: 1:1 collaboration with primary care provider regarding development and update of comprehensive plan of care as evidenced by provider attestation and co-signature Inter-disciplinary care team collaboration (see longitudinal plan of care) Evaluation of current treatment plan related to  self management and patient's adherence to plan as established by provider  Diabetes Interventions:  (Status:  Goal on track:  Yes.) Long Term Goal Assessed patient's understanding of A1c goal: <7% Provided education to patient about basic DM disease process Reviewed medications with patient and discussed importance of medication adherence Counseled on importance of regular laboratory  monitoring as prescribed Discussed plans with patient for ongoing care management follow up and provided patient with direct contact information for care management team Advised patient, providing education and rationale, to check cbg 3 and record, calling PCP for findings outside established parameters Lab Results  Component Value Date   HGBA1C 8.9 (H) 02/02/2021   Hypertension Interventions:  (Status:  Goal on track:  Yes.) Long Term Goal Last practice recorded BP readings:  BP Readings from Last 3 Encounters:  07/25/21 138/80  05/16/21 125/80  04/11/21 118/80  Most recent eGFR/CrCl: No results found for: "EGFR"  No components found for: "CRCL"  Evaluation of current treatment plan related to hypertension self management and patient's adherence to plan as established by provider Reviewed medications with patient and discussed importance of compliance Advised patient, providing education and rationale, to monitor blood pressure daily and record, calling PCP for findings outside established parameters Provided education on prescribed diet LOW SALT, LOW FAT, CARBOHYDRATE MODIFIED Encouraged to increase activity as tolerated Encouraged to record BP readings for provider review  Patient Goals/Self-Care Activities: Take all medications as prescribed Attend all scheduled provider appointments Call provider office for new concerns or questions  schedule appointment with eye doctor check blood sugar at prescribed times: before meals and at bedtime and when you have symptoms of low or high blood sugar check feet daily for cuts, sores or redness fill half of plate with vegetables limit fast food meals to no more than 1 per week check blood pressure daily keep a blood pressure log take blood pressure log to all doctor appointments call doctor for signs and symptoms of high blood pressure keep all doctor appointments report new symptoms to your doctor  Follow Up Plan:  The care management  team will reach out to the patient again over the next 45 days.      Plan: The care management team will reach out to the patient again over the next 60 days.  Hubert Azure RN, MSN RN Care Management Coordinator [place] [phone] [email or web address]

## 2021-07-29 NOTE — Patient Instructions (Addendum)
Visit Information  Thank you for taking time to visit with me today. Please don't hesitate to contact me if I can be of assistance to you before our next scheduled telephone appointment.  Following are the goals we discussed today:   Our next appointment is by telephone on 09/01 at 1300  Please call the care guide team at [phone] if you need to cancel or reschedule your appointment.   If you are experiencing a Mental Health or [place] or need someone to talk to, please    The patient verbalized understanding of instructions, educational materials, and care plan provided today and DECLINED offer to receive copy of patient instructions, educational materials, and care plan.   call the Suicide and Crisis Lifeline: 988 call the [place] National Suicide Prevention Lifeline: (930) 049-7536 or TTY: 469 027 5410 TTY 830-175-8492) to talk to a trained counselor call 1-800-273-TALK (toll free, 24 hour hotline) call 911The care management team will reach out to the patient again over the next 60 days.     Hubert Azure RN, MSN RN Care Management Coordinator [place] [phone] [email or web address]

## 2021-08-01 DIAGNOSIS — Z794 Long term (current) use of insulin: Secondary | ICD-10-CM

## 2021-08-01 DIAGNOSIS — E1165 Type 2 diabetes mellitus with hyperglycemia: Secondary | ICD-10-CM

## 2021-08-01 DIAGNOSIS — I10 Essential (primary) hypertension: Secondary | ICD-10-CM | POA: Diagnosis not present

## 2021-08-08 ENCOUNTER — Other Ambulatory Visit: Payer: Medicare Other

## 2021-08-08 DIAGNOSIS — E1165 Type 2 diabetes mellitus with hyperglycemia: Secondary | ICD-10-CM | POA: Diagnosis not present

## 2021-08-09 ENCOUNTER — Other Ambulatory Visit (INDEPENDENT_AMBULATORY_CARE_PROVIDER_SITE_OTHER): Payer: PPO

## 2021-08-09 DIAGNOSIS — M1A9XX1 Chronic gout, unspecified, with tophus (tophi): Secondary | ICD-10-CM | POA: Diagnosis not present

## 2021-08-09 DIAGNOSIS — Z794 Long term (current) use of insulin: Secondary | ICD-10-CM

## 2021-08-09 DIAGNOSIS — E1165 Type 2 diabetes mellitus with hyperglycemia: Secondary | ICD-10-CM

## 2021-08-10 ENCOUNTER — Telehealth: Payer: Self-pay | Admitting: Family Medicine

## 2021-08-10 LAB — CBC WITH DIFFERENTIAL/PLATELET
Basophils Absolute: 0.2 10*3/uL — ABNORMAL HIGH (ref 0.0–0.1)
Basophils Relative: 1.8 % (ref 0.0–3.0)
Eosinophils Absolute: 0.3 10*3/uL (ref 0.0–0.7)
Eosinophils Relative: 3.1 % (ref 0.0–5.0)
HCT: 44.5 % (ref 39.0–52.0)
Hemoglobin: 15.5 g/dL (ref 13.0–17.0)
Lymphocytes Relative: 33.7 % (ref 12.0–46.0)
Lymphs Abs: 2.8 10*3/uL (ref 0.7–4.0)
MCHC: 34.8 g/dL (ref 30.0–36.0)
MCV: 95.3 fl (ref 78.0–100.0)
Monocytes Absolute: 0.5 10*3/uL (ref 0.1–1.0)
Monocytes Relative: 5.9 % (ref 3.0–12.0)
Neutro Abs: 4.7 10*3/uL (ref 1.4–7.7)
Neutrophils Relative %: 55.5 % (ref 43.0–77.0)
Platelets: 166 10*3/uL (ref 150.0–400.0)
RBC: 4.67 Mil/uL (ref 4.22–5.81)
RDW: 14.6 % (ref 11.5–15.5)
WBC: 8.4 10*3/uL (ref 4.0–10.5)

## 2021-08-10 LAB — BASIC METABOLIC PANEL
BUN: 26 mg/dL — ABNORMAL HIGH (ref 6–23)
CO2: 26 mEq/L (ref 19–32)
Calcium: 9.3 mg/dL (ref 8.4–10.5)
Chloride: 105 mEq/L (ref 96–112)
Creatinine, Ser: 2.02 mg/dL — ABNORMAL HIGH (ref 0.40–1.50)
GFR: 31.03 mL/min — ABNORMAL LOW (ref >60.00)
Glucose, Bld: 184 mg/dL — ABNORMAL HIGH (ref 70–99)
Potassium: 3.6 mEq/L (ref 3.5–5.1)
Sodium: 142 mEq/L (ref 135–145)

## 2021-08-10 LAB — HEMOGLOBIN A1C: Hgb A1c MFr Bld: 8.5 % — ABNORMAL HIGH (ref 4.6–6.5)

## 2021-08-10 LAB — URIC ACID: Uric Acid, Serum: 7.3 mg/dL (ref 4.0–7.8)

## 2021-08-10 NOTE — Telephone Encounter (Signed)
Copied from [place] [phone]. Topic: Medicare AWV >> Aug 10, 2021 10:43 AM Devoria Glassing wrote: Reason for CRM: Left message for patient to schedule Annual Wellness Visit.  Please schedule with Nurse Health Advisor Denisa O'Brien-Blaney, LPN at [facility]. This appt can be telephone or office visit.  Please call [phone] ask for [facility]

## 2021-08-12 ENCOUNTER — Other Ambulatory Visit: Payer: Self-pay | Admitting: Family Medicine

## 2021-08-12 DIAGNOSIS — M1A9XX1 Chronic gout, unspecified, with tophus (tophi): Secondary | ICD-10-CM

## 2021-08-12 DIAGNOSIS — N1831 Chronic kidney disease, stage 3a: Secondary | ICD-10-CM

## 2021-08-17 NOTE — Addendum Note (Signed)
Addended by: Leone Haven on: 08/17/2021 09:58 AM   Modules accepted: Orders

## 2021-08-29 ENCOUNTER — Other Ambulatory Visit (INDEPENDENT_AMBULATORY_CARE_PROVIDER_SITE_OTHER): Payer: PPO

## 2021-08-29 DIAGNOSIS — M1A9XX1 Chronic gout, unspecified, with tophus (tophi): Secondary | ICD-10-CM

## 2021-08-29 DIAGNOSIS — N1831 Chronic kidney disease, stage 3a: Secondary | ICD-10-CM

## 2021-08-30 LAB — BASIC METABOLIC PANEL
BUN: 22 mg/dL (ref 6–23)
CO2: 27 mEq/L (ref 19–32)
Calcium: 9.7 mg/dL (ref 8.4–10.5)
Chloride: 101 mEq/L (ref 96–112)
Creatinine, Ser: 2.07 mg/dL — ABNORMAL HIGH (ref 0.40–1.50)
GFR: 30.12 mL/min — ABNORMAL LOW (ref >60.00)
Glucose, Bld: 61 mg/dL — ABNORMAL LOW (ref 70–99)
Potassium: 3.4 mEq/L — ABNORMAL LOW (ref 3.5–5.1)
Sodium: 143 mEq/L (ref 135–145)

## 2021-08-30 LAB — URIC ACID: Uric Acid, Serum: 9.3 mg/dL — ABNORMAL HIGH (ref 4.0–7.8)

## 2021-08-31 ENCOUNTER — Telehealth: Payer: Self-pay | Admitting: Family Medicine

## 2021-08-31 NOTE — Telephone Encounter (Signed)
Pt returning call to cma 

## 2021-09-01 NOTE — Telephone Encounter (Signed)
Pt called about lab results. °

## 2021-09-02 ENCOUNTER — Ambulatory Visit: Payer: Self-pay

## 2021-09-02 NOTE — Patient Outreach (Signed)
  Care Coordination   09/02/2021 Name: Shane Jones MRN: [id number] DOB: [birth year]   Care Coordination Outreach Attempts:  An unsuccessful telephone outreach was attempted for a scheduled appointment today.  Follow Up Plan:  Additional outreach attempts will be made to offer the patient care coordination information and services.   Encounter Outcome:  No Answer  Care Coordination Interventions Activated:  No   Care Coordination Interventions:  No, not indicated    Quinn Plowman RN,BSN,CCM RN Care Manager Coordinator [phone]

## 2021-09-06 ENCOUNTER — Telehealth: Payer: Self-pay

## 2021-09-06 ENCOUNTER — Telehealth: Payer: Self-pay | Admitting: *Deleted

## 2021-09-06 NOTE — Chronic Care Management (AMB) (Signed)
  Care Coordination  Outreach Note  09/06/2021 Name: Shane Jones MRN: [id number] DOB: 17-Jan-1945   Care Coordination Outreach Attempts: An unsuccessful telephone outreach was attempted today to offer the patient information about available care coordination services as a benefit of their health plan.   Follow Up Plan:  Additional outreach attempts will be made to offer the patient care coordination information and services.   Encounter Outcome:  No Answer  Julian Hy, [place] Direct Dial: [phone]

## 2021-09-06 NOTE — Telephone Encounter (Signed)
Lvm for pt to return call in regards to lab results.  Per Dr.Sonnenberg: Please contact the patient and see if he has been taking his allopurinol every day.  His uric acid went up compared to 3 weeks ago.  His kidney function remains reduced though is stable compared to 3 weeks ago.  Does he see a nephrologist?  If he does not, I would suggest that we refer him to 1.

## 2021-09-08 NOTE — Telephone Encounter (Signed)
Called pt x2 lvm x2 for pt to return call in regards to lab results.  Letter created & sent to mail for pt to reach out to ofc.  Per Dr.Sonnenberg: Please contact the patient and see if he has been taking his allopurinol every day.  His uric acid went up compared to 3 weeks ago.  His kidney function remains reduced though is stable compared to 3 weeks ago.  Does he see a nephrologist?  If he does not, I would suggest that we refer him to 1.

## 2021-09-08 NOTE — Telephone Encounter (Signed)
Pt called back and I read the message to him and does not see a nephrologist and he has been taking the allopurinol but he does forget to take it every once in awhile. Pt will wait until he goes to his other appointment before he gets a referral to the kidney doctor

## 2021-09-14 NOTE — Chronic Care Management (AMB) (Signed)
  Care Coordination   Note   09/14/2021 Name: Shane Jones MRN: [id number] DOB: [birth year]  Shane Jones is a 76 y.o. year old male who sees Caryl Bis, Angela Adam, MD for primary care. I reached out to Irean Hong by phone today to offer care coordination services.  [name] was given information about Care Coordination services today including:   The Care Coordination services include support from the care team which includes your Nurse Coordinator, Clinical Social Worker, or Pharmacist.  The Care Coordination team is here to help remove barriers to the health concerns and goals most important to you. Care Coordination services are voluntary, and the patient may decline or stop services at any time by request to their care team member.   Care Coordination Consent Status: Patient agreed to services and verbal consent obtained.   Follow up plan:  Telephone appointment with care coordination team member scheduled for:  12/05/2021  Encounter Outcome:  Pt. Scheduled  Julian Hy, [place] Direct Dial: [phone]

## 2021-09-19 ENCOUNTER — Telehealth: Payer: Self-pay

## 2021-09-19 DIAGNOSIS — E1165 Type 2 diabetes mellitus with hyperglycemia: Secondary | ICD-10-CM | POA: Diagnosis not present

## 2021-09-19 NOTE — Telephone Encounter (Signed)
I called and LVM for patient to come and get his sensor for libre before 5 pm or tomorrow between 8 am and 5 pm.  Yeila Morro,cma

## 2021-09-19 NOTE — Telephone Encounter (Signed)
Patient states he has been trying to get more of the Continuous Blood Gluc Sensor (FREESTYLE LIBRE 2 SENSOR) MISC from [place].  Patient states he is out of sensors and would like to know if we have a sample that he can have.  Patient states [facility] told patient that his insurance has not paid for the last one.  Please call today.

## 2021-09-19 NOTE — Telephone Encounter (Signed)
Patient called and said he spoke to [facility], they are requesting a new prescription, 90 day supply for [place] 2. Patient currently does not have on, in the pass few days the have not worked. Does the office have a sample of [place] 2 to help him out until he receives some?

## 2021-09-21 NOTE — Telephone Encounter (Signed)
I called and LVM to remind the patient that he could come in and pick up his sample sensor. Malak Duchesneau,cma

## 2021-09-22 DIAGNOSIS — N1832 Chronic kidney disease, stage 3b: Secondary | ICD-10-CM | POA: Diagnosis not present

## 2021-09-22 DIAGNOSIS — E785 Hyperlipidemia, unspecified: Secondary | ICD-10-CM | POA: Diagnosis not present

## 2021-09-22 DIAGNOSIS — E1169 Type 2 diabetes mellitus with other specified complication: Secondary | ICD-10-CM | POA: Diagnosis not present

## 2021-09-22 DIAGNOSIS — E1140 Type 2 diabetes mellitus with diabetic neuropathy, unspecified: Secondary | ICD-10-CM | POA: Diagnosis not present

## 2021-09-22 DIAGNOSIS — E1122 Type 2 diabetes mellitus with diabetic chronic kidney disease: Secondary | ICD-10-CM | POA: Diagnosis not present

## 2021-09-22 DIAGNOSIS — I129 Hypertensive chronic kidney disease with stage 1 through stage 4 chronic kidney disease, or unspecified chronic kidney disease: Secondary | ICD-10-CM | POA: Diagnosis not present

## 2021-09-22 DIAGNOSIS — Z794 Long term (current) use of insulin: Secondary | ICD-10-CM | POA: Diagnosis not present

## 2021-09-22 NOTE — Telephone Encounter (Signed)
Lvm to inform pt that he is welcome to come into ofc to p/u sensor for libre M-F 8-5.

## 2021-09-26 NOTE — Telephone Encounter (Signed)
Left VM to pick up sensor between 8:00 and 5:00 Monday - Friday.

## 2021-10-10 ENCOUNTER — Telehealth: Payer: Self-pay | Admitting: Pharmacy Technician

## 2021-10-10 DIAGNOSIS — Z596 Low income: Secondary | ICD-10-CM

## 2021-10-10 NOTE — Progress Notes (Signed)
[facility])                                            [place] Team    10/10/2021  Shane Jones December 08, 1945 [id number]                                      Medication Assistance Referral-FOR 2024 RE ENROLLMENT  Referral From: [place]  Medication/Company: [name] Patient application portion:  Mailed Provider application portion: Faxed  to Dr. [name] Provider address/fax verified via: Office website  Medication/Company: Danelle Berry / Ralph Leyden Patient application portion:  Mailed Provider application portion: Faxed  to Dr. [name] Provider address/fax verified via: Office website  Medication/Company: Judene Companion Patient application portion:  Mailed Provider application portion: Faxed  to Dr. [name] Provider address/fax verified via: Office website  Medication/Company: Humalog / Lilly Patient application portion:  Mailed Provider application portion: Faxed  to Dr. [name] Provider address/fax verified via: Office website   Shane Jones, Shane Jones  [phone]

## 2021-10-14 ENCOUNTER — Telehealth: Payer: Self-pay | Admitting: Family Medicine

## 2021-10-14 NOTE — Telephone Encounter (Signed)
Copied from [place] [phone]. Topic: Medicare AWV >> Oct 14, 2021  1:31 PM Jae Dire wrote: Reason for CRM:  Left message for patient to call back and schedule Medicare Annual Wellness Visit (AWV) in office.   If unable to come into the office for AWV,  please offer to do virtually or by telephone.  Last AWV: 02/10/2020  Please schedule at anytime with [place].  30 minute appointment for Virtual or phone 45 minute appointment for in office or Initial virtual/phone  Any questions, please contact me at [phone]
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
## 2021-10-17 NOTE — Telephone Encounter (Signed)
Shane Jones - just forwarding for [facility]

## 2021-10-19 DIAGNOSIS — E1165 Type 2 diabetes mellitus with hyperglycemia: Secondary | ICD-10-CM | POA: Diagnosis not present

## 2021-10-20 ENCOUNTER — Other Ambulatory Visit: Payer: Self-pay

## 2021-10-20 DIAGNOSIS — Z794 Long term (current) use of insulin: Secondary | ICD-10-CM

## 2021-10-20 DIAGNOSIS — E108 Type 1 diabetes mellitus with unspecified complications: Secondary | ICD-10-CM

## 2021-10-20 MED ORDER — EMPAGLIFLOZIN 25 MG PO TABS: 25.0000 mg | ORAL_TABLET | Freq: Every day | ORAL | 3 refills | Status: DC

## 2021-10-20 MED ORDER — TRULICITY 4.5 MG/0.5ML ~~LOC~~ SOAJ: 4.5000 mg | SUBCUTANEOUS | 1 refills | Status: DC

## 2021-10-20 MED ORDER — BASAGLAR KWIKPEN 100 UNIT/ML ~~LOC~~ SOPN: 20.0000 [IU] | PEN_INJECTOR | Freq: Every day | SUBCUTANEOUS | 0 refills | Status: DC

## 2021-10-20 MED ORDER — INSULIN LISPRO (1 UNIT DIAL) 100 UNIT/ML (KWIKPEN): 24.0000 [IU] | PEN_INJECTOR | Freq: Two times a day (BID) | SUBCUTANEOUS | 0 refills | Status: DC

## 2021-10-21 ENCOUNTER — Other Ambulatory Visit: Payer: Self-pay | Admitting: Family Medicine

## 2021-10-21 DIAGNOSIS — E108 Type 1 diabetes mellitus with unspecified complications: Secondary | ICD-10-CM

## 2021-10-24 ENCOUNTER — Telehealth: Payer: Self-pay | Admitting: Family Medicine

## 2021-10-24 NOTE — Telephone Encounter (Signed)
Patient called and said all his medications need to go to [place]. Patient would like Gae Bon to call him.

## 2021-10-25 NOTE — Telephone Encounter (Signed)
LVM for patient to call back.   Iman Reinertsen,cma  

## 2021-10-27 NOTE — Telephone Encounter (Signed)
Lvm for patient to call back to see which medications he wanted to send to a  different pharmacy and [place], [facility] or [facility].  Caetano Oberhaus,cma

## 2021-10-31 ENCOUNTER — Other Ambulatory Visit: Payer: Self-pay

## 2021-10-31 DIAGNOSIS — I10 Essential (primary) hypertension: Secondary | ICD-10-CM

## 2021-10-31 DIAGNOSIS — F320 Major depressive disorder, single episode, mild: Secondary | ICD-10-CM

## 2021-10-31 DIAGNOSIS — E1165 Type 2 diabetes mellitus with hyperglycemia: Secondary | ICD-10-CM

## 2021-10-31 DIAGNOSIS — E1021 Type 1 diabetes mellitus with diabetic nephropathy: Secondary | ICD-10-CM

## 2021-10-31 DIAGNOSIS — E108 Type 1 diabetes mellitus with unspecified complications: Secondary | ICD-10-CM

## 2021-10-31 MED ORDER — METFORMIN HCL ER 500 MG PO TB24: 500.0000 mg | ORAL_TABLET | Freq: Two times a day (BID) | ORAL | 3 refills | Status: DC

## 2021-10-31 MED ORDER — EMPAGLIFLOZIN 25 MG PO TABS: 25.0000 mg | ORAL_TABLET | Freq: Every day | ORAL | 3 refills | Status: DC

## 2021-10-31 MED ORDER — TRULICITY 4.5 MG/0.5ML ~~LOC~~ SOAJ: SUBCUTANEOUS | 0 refills | Status: DC

## 2021-10-31 MED ORDER — INSULIN LISPRO (1 UNIT DIAL) 100 UNIT/ML (KWIKPEN): 24.0000 [IU] | PEN_INJECTOR | Freq: Two times a day (BID) | SUBCUTANEOUS | 0 refills | Status: DC

## 2021-10-31 MED ORDER — BASAGLAR KWIKPEN 100 UNIT/ML ~~LOC~~ SOPN: 20.0000 [IU] | PEN_INJECTOR | Freq: Every day | SUBCUTANEOUS | 0 refills | Status: DC

## 2021-10-31 NOTE — Telephone Encounter (Signed)
Patient states he would like to speak with Fulton Mole, CMA.  I did read Nina's message to patient, and patient states he wants to switch all of his medications.  Patient was adamant that he speak with Gae Bon.  I spoke with Gae Bon and transferred call to her.

## 2021-10-31 NOTE — Telephone Encounter (Signed)
LVM for patient to call back.   Harvel Meskill,cma  

## 2021-10-31 NOTE — Telephone Encounter (Signed)
Patient wanted all his medication sent to [facility] and I sent what I could and I pended the others.  Shane Jones,cma

## 2021-11-01 NOTE — Telephone Encounter (Signed)
There were not any medications pended. Can you please confirm with the patient what medications he is currently taking and up date his medication list? There are several medications on the list that have not been filled since 2021 or 2022.

## 2021-11-02 DIAGNOSIS — E113292 Type 2 diabetes mellitus with mild nonproliferative diabetic retinopathy without macular edema, left eye: Secondary | ICD-10-CM | POA: Diagnosis not present

## 2021-11-02 LAB — HM DIABETES EYE EXAM

## 2021-11-07 ENCOUNTER — Telehealth: Payer: Self-pay | Admitting: Family Medicine

## 2021-11-07 ENCOUNTER — Other Ambulatory Visit: Payer: Self-pay

## 2021-11-07 DIAGNOSIS — E876 Hypokalemia: Secondary | ICD-10-CM

## 2021-11-07 DIAGNOSIS — E785 Hyperlipidemia, unspecified: Secondary | ICD-10-CM

## 2021-11-07 DIAGNOSIS — E1021 Type 1 diabetes mellitus with diabetic nephropathy: Secondary | ICD-10-CM

## 2021-11-07 DIAGNOSIS — Z794 Long term (current) use of insulin: Secondary | ICD-10-CM

## 2021-11-07 DIAGNOSIS — K209 Esophagitis, unspecified without bleeding: Secondary | ICD-10-CM

## 2021-11-07 DIAGNOSIS — G629 Polyneuropathy, unspecified: Secondary | ICD-10-CM

## 2021-11-07 DIAGNOSIS — E108 Type 1 diabetes mellitus with unspecified complications: Secondary | ICD-10-CM

## 2021-11-07 DIAGNOSIS — M1A9XX1 Chronic gout, unspecified, with tophus (tophi): Secondary | ICD-10-CM

## 2021-11-07 DIAGNOSIS — I10 Essential (primary) hypertension: Secondary | ICD-10-CM

## 2021-11-07 MED ORDER — METOPROLOL SUCCINATE ER 100 MG PO TB24
100.0000 mg | ORAL_TABLET | Freq: Every day | ORAL | 1 refills | Status: DC
  Filled 2021-11-07: qty 90, fill #0

## 2021-11-07 MED ORDER — EZETIMIBE 10 MG PO TABS
10.0000 mg | ORAL_TABLET | Freq: Every day | ORAL | 1 refills | Status: DC
  Filled 2021-11-07: qty 90, 90d supply, fill #0

## 2021-11-07 MED ORDER — ATORVASTATIN CALCIUM 80 MG PO TABS
80.0000 mg | ORAL_TABLET | Freq: Every day | ORAL | 1 refills | Status: DC
  Filled 2021-11-07: qty 90, fill #0

## 2021-11-07 MED ORDER — TRULICITY 4.5 MG/0.5ML ~~LOC~~ SOAJ
4.5000 mg | SUBCUTANEOUS | 0 refills | Status: DC
  Filled 2021-11-07 – 2021-11-10 (×3): qty 2, 28d supply, fill #0

## 2021-11-07 MED ORDER — ALLOPURINOL 100 MG PO TABS
100.0000 mg | ORAL_TABLET | Freq: Every day | ORAL | 1 refills | Status: DC
  Filled 2021-11-07: qty 90, fill #0

## 2021-11-07 MED ORDER — AMLODIPINE BESYLATE 10 MG PO TABS
10.0000 mg | ORAL_TABLET | Freq: Every day | ORAL | 1 refills | Status: DC
  Filled 2021-11-07: qty 90, fill #0

## 2021-11-07 MED ORDER — EMPAGLIFLOZIN 25 MG PO TABS
25.0000 mg | ORAL_TABLET | Freq: Every day | ORAL | 3 refills | Status: DC
  Filled 2021-11-07 – 2021-11-08 (×2): qty 90, 90d supply, fill #0

## 2021-11-07 MED ORDER — LOSARTAN POTASSIUM 100 MG PO TABS
100.0000 mg | ORAL_TABLET | Freq: Every day | ORAL | 1 refills | Status: DC
  Filled 2021-11-07: qty 90, fill #0

## 2021-11-07 MED ORDER — METFORMIN HCL ER 500 MG PO TB24
500.0000 mg | ORAL_TABLET | Freq: Two times a day (BID) | ORAL | 3 refills | Status: DC
  Filled 2021-11-07 – 2021-11-08 (×2): qty 180, 90d supply, fill #0

## 2021-11-07 MED ORDER — GABAPENTIN 400 MG PO CAPS
400.0000 mg | ORAL_CAPSULE | Freq: Three times a day (TID) | ORAL | 0 refills | Status: DC
  Filled 2021-11-07 – 2021-11-08 (×2): qty 270, 90d supply, fill #0

## 2021-11-07 MED ORDER — INSULIN LISPRO (1 UNIT DIAL) 100 UNIT/ML (KWIKPEN)
24.0000 [IU] | PEN_INJECTOR | Freq: Two times a day (BID) | SUBCUTANEOUS | 0 refills | Status: DC
  Filled 2021-11-07 – 2021-11-08 (×2): qty 15, 30d supply, fill #0

## 2021-11-07 MED ORDER — PANTOPRAZOLE SODIUM 40 MG PO TBEC
40.0000 mg | DELAYED_RELEASE_TABLET | Freq: Every day | ORAL | 0 refills | Status: DC
  Filled 2021-11-07 – 2021-11-08 (×2): qty 90, 90d supply, fill #0

## 2021-11-07 MED ORDER — FUROSEMIDE 20 MG PO TABS
20.0000 mg | ORAL_TABLET | Freq: Every day | ORAL | 1 refills | Status: DC
  Filled 2021-11-07: qty 90, fill #0

## 2021-11-07 MED ORDER — LANTUS SOLOSTAR 100 UNIT/ML ~~LOC~~ SOPN
20.0000 [IU] | PEN_INJECTOR | Freq: Every day | SUBCUTANEOUS | 0 refills | Status: DC
  Filled 2021-11-07 – 2021-11-08 (×2): qty 15, 75d supply, fill #0

## 2021-11-07 NOTE — Telephone Encounter (Signed)
Copied from [place] [phone]. Topic: Medicare AWV >> Nov 07, 2021  2:46 PM Devoria Glassing wrote: Reason for CRM: Left message for patient to schedule Annual Wellness Visit.  Please schedule with Nurse Health Advisor Denisa O'Brien-Blaney, LPN at [facility]. This appt can be telephone or office visit.  Please call [phone] ask for [facility]

## 2021-11-08 ENCOUNTER — Other Ambulatory Visit (HOSPITAL_COMMUNITY): Payer: Self-pay

## 2021-11-08 ENCOUNTER — Other Ambulatory Visit: Payer: Self-pay

## 2021-11-08 NOTE — Telephone Encounter (Signed)
Medication was sent to [place] and I called the pharmacy because the medications needed to go to the [facility] health mail order, the pharmacy stated they will call them and let them know and they will refill the medications.  Ruqaya Strauss,cma

## 2021-11-08 NOTE — Telephone Encounter (Signed)
Noted. Can you contact him and reconcile his medications? As noted in my prior message there are a number of medications on his list that have not been refilled any time recently and I need to know exactly what he is taking. Thanks.

## 2021-11-09 ENCOUNTER — Other Ambulatory Visit (HOSPITAL_COMMUNITY): Payer: Self-pay

## 2021-11-10 ENCOUNTER — Other Ambulatory Visit (HOSPITAL_COMMUNITY): Payer: Self-pay

## 2021-11-10 NOTE — Telephone Encounter (Signed)
Pt called for an update on his medicines to see if they were sent to the [facility] mail order pharmacy. I s/w Nina,CMA & she stated rx's are taken care of & she has s/w pharmacy & they would be sending all of his prescriptions to [facility], pt verbalized understanding. Tried to reconcile meds w/pt but was unable to due to pt stating that his endocrinologist Dr.Matthew Hartford Poli took him off of most of his medicines. I tried to reconcile what he takes daily or what he can remember but was unsuccessful. I advised pt to just bring all of his medicines even the ones he is not on at his upcoming f/u appt w/PCP on 11/28/21. Pt verbalized understanding to info & will do as I advised.

## 2021-11-11 ENCOUNTER — Other Ambulatory Visit (HOSPITAL_COMMUNITY): Payer: Self-pay

## 2021-11-11 NOTE — Telephone Encounter (Signed)
Noted  

## 2021-11-21 ENCOUNTER — Telehealth: Payer: Self-pay

## 2021-11-21 DIAGNOSIS — Z794 Long term (current) use of insulin: Secondary | ICD-10-CM

## 2021-11-21 NOTE — Telephone Encounter (Signed)
I have placed the referral for this.

## 2021-11-21 NOTE — Progress Notes (Signed)
Hello Dr. Caryl Bis,  Mr. Calk reached out to me this morning requesting Medication assistance. Pt states that his medications have went up to $290 and he can not afford that. Pt is requesting for Catie to help him. I discussed with Catie and she would like to see if you could place an Urgent referral for Pharmacy assistance.  Thank you, Noreene Larsson, [place], [place] [address] Direct Dial: [phone] [email or web address]

## 2021-11-21 NOTE — Addendum Note (Signed)
Addended by: Leone Haven on: 11/21/2021 02:17 PM   Modules accepted: Orders

## 2021-11-22 ENCOUNTER — Other Ambulatory Visit (HOSPITAL_COMMUNITY): Payer: Self-pay

## 2021-11-23 ENCOUNTER — Other Ambulatory Visit (HOSPITAL_COMMUNITY): Payer: Self-pay

## 2021-11-23 ENCOUNTER — Other Ambulatory Visit: Payer: PPO | Admitting: Pharmacist

## 2021-11-23 DIAGNOSIS — G629 Polyneuropathy, unspecified: Secondary | ICD-10-CM

## 2021-11-23 DIAGNOSIS — E785 Hyperlipidemia, unspecified: Secondary | ICD-10-CM

## 2021-11-23 DIAGNOSIS — M1A9XX1 Chronic gout, unspecified, with tophus (tophi): Secondary | ICD-10-CM

## 2021-11-23 DIAGNOSIS — I10 Essential (primary) hypertension: Secondary | ICD-10-CM

## 2021-11-23 DIAGNOSIS — E876 Hypokalemia: Secondary | ICD-10-CM

## 2021-11-23 DIAGNOSIS — K209 Esophagitis, unspecified without bleeding: Secondary | ICD-10-CM

## 2021-11-23 MED ORDER — FUROSEMIDE 20 MG PO TABS
20.0000 mg | ORAL_TABLET | Freq: Every day | ORAL | 1 refills | Status: DC
  Filled 2021-11-23 – 2021-12-20 (×2): qty 90, 90d supply, fill #0

## 2021-11-23 MED ORDER — ATORVASTATIN CALCIUM 80 MG PO TABS
80.0000 mg | ORAL_TABLET | Freq: Every day | ORAL | 1 refills | Status: DC
  Filled 2021-11-23 – 2021-12-20 (×2): qty 90, 90d supply, fill #0

## 2021-11-23 MED ORDER — PANTOPRAZOLE SODIUM 40 MG PO TBEC
40.0000 mg | DELAYED_RELEASE_TABLET | Freq: Every day | ORAL | 1 refills | Status: DC
  Filled 2021-11-23 – 2021-12-20 (×2): qty 90, 90d supply, fill #0

## 2021-11-23 MED ORDER — GABAPENTIN 400 MG PO CAPS
400.0000 mg | ORAL_CAPSULE | Freq: Three times a day (TID) | ORAL | 0 refills | Status: DC | PRN
  Filled 2021-11-23 – 2021-12-20 (×2): qty 270, 90d supply, fill #0

## 2021-11-23 MED ORDER — ALLOPURINOL 100 MG PO TABS
100.0000 mg | ORAL_TABLET | Freq: Every day | ORAL | 1 refills | Status: DC
  Filled 2021-11-23 – 2021-12-20 (×2): qty 90, 90d supply, fill #0

## 2021-11-23 MED ORDER — LOSARTAN POTASSIUM 100 MG PO TABS
100.0000 mg | ORAL_TABLET | Freq: Every day | ORAL | 1 refills | Status: DC
  Filled 2021-11-23 – 2021-12-20 (×2): qty 90, 90d supply, fill #0

## 2021-11-23 MED ORDER — AMLODIPINE BESYLATE 10 MG PO TABS
10.0000 mg | ORAL_TABLET | Freq: Every day | ORAL | 1 refills | Status: DC
  Filled 2021-11-23 – 2021-12-20 (×2): qty 90, 90d supply, fill #0

## 2021-11-23 MED ORDER — METOPROLOL SUCCINATE ER 100 MG PO TB24
100.0000 mg | ORAL_TABLET | Freq: Every day | ORAL | 1 refills | Status: DC
  Filled 2021-11-23 – 2021-12-21 (×2): qty 90, 90d supply, fill #0

## 2021-11-23 MED ORDER — ESCITALOPRAM OXALATE 20 MG PO TABS
ORAL_TABLET | ORAL | 1 refills | Status: DC
  Filled 2021-11-23 – 2021-12-20 (×2): qty 90, 90d supply, fill #0

## 2021-11-23 MED ORDER — EZETIMIBE 10 MG PO TABS
10.0000 mg | ORAL_TABLET | Freq: Every day | ORAL | 1 refills | Status: DC
  Filled 2021-11-23 – 2021-12-20 (×2): qty 90, 90d supply, fill #0

## 2021-11-23 NOTE — Progress Notes (Signed)
11/23/2021 Name: Shane Jones MRN: [id number] DOB: November 28, 1945  Chief Complaint  Patient presents with   Medication Management   Hypertension   Diabetes   Hyperlipidemia    Shane Jones is a 76 y.o. year old male who presented for a telephone visit.   They were referred to the pharmacist by their PCP for assistance in managing diabetes.    Subjective:  Care Team: Primary Care Provider: Leone Haven, MD ; Next Scheduled Visit: 12/01/21  Medication Access/Adherence  Current Pharmacy:  [id number] [address], [address] [place] [address] Phone: [phone] Fax: [phone]  [facility] [place], [address] [place] [place] [address] Phone: [phone] Fax: [phone]  [place], [address] [place] [place] [address] Phone: [phone] Fax: [phone]  [place] ([place], [address] [address] Phone: [phone] Fax: [place] [address] Phone: [phone] Fax: [place] [address] Phone: [phone] Fax: [phone]   Patient reports affordability concerns with their medications: Yes  Patient reports access/transportation concerns to their pharmacy: No  Patient reports adherence concerns with their medications:  Yes    Reports today that he knows he's been confused with his medications and wants to start from scratch. Lost insurance in June due to not paying a premium, now has Engineer, maintenance (IT). Wants to have all medications sent to [facility] for Mail Order. We  discussed Adherence Packaging when that program is available.   When I called him at first, my call from the clinic phone number went straight to voicemail. Helped patient program [employer] and [facility] in his phone so calls do not go to voicemail.   Diabetes:  Current medications: Trulicity 4.5 mg weekly, Jardiance 25 mg daily, Basaglar - though reports he's taking around 16 units daily, and still occasionally taking Humalog for correction, but then has hypoglycemic episodes.  Medications tried in the past: Shane Jones stopped metformin but apparently plans to restart moving forward, per patient report  Current medication access support: needs to reapply for patient assistance. Could consider transition to [place] therapy pending endocrinology preference  Hypertension:  Current medications: losartan 100 mg daily, amlodipine 10 mg daily, metoprolol succinate 100 mg daily, prescribed hydralazine 10 mg twice daily but unclear if he is taking; furosemide 20 mg daily + potassium 10 mEq 1-2 times daily, he isn't clear how often he takes this. Last script written was for a short course due to hypokalemia  Patient does not have a validated, automated, upper arm home BP cuff  Hyperlipidemia/ASCVD Risk Reduction  Current lipid lowering medications: atorvastatin 80 mg - though reads off a 40 mg dose that he has at home; ezetimibe 10 mg daily - though does not have this today  Antiplatelet regimen: aspirin 325 mg daily- previously discussed dose reduction but patient declined   Neuropathic Pain:  Current medications: gabapentin 400 mg up to three times daily as needed  Gout: Current medications: allopurinol 100 mg daily - though per uric acid, likely has not been taking regularly  GERD:  Current medications: pantoprazole 40 mg daily  Health Maintenance  Health Maintenance Due  Topic Date Due   Zoster Vaccines- Shingrix (1 of 2) Never done   Medicare Annual Wellness (AWV)   02/09/2021   INFLUENZA VACCINE  08/02/2021   COVID-19 Vaccine (3 - 2023-24 season) 09/02/2021   Diabetic kidney evaluation - Urine ACR  09/29/2021   FOOT EXAM  09/29/2021     Objective: Lab Results  Component Value Date   HGBA1C 8.5 (H) 08/09/2021    Lab Results  Component Value Date   CREATININE 2.07 (H) 08/29/2021   BUN 22 08/29/2021   NA 143 08/29/2021   K 3.4 (L) 08/29/2021   CL 101 08/29/2021   CO2 27 08/29/2021    Lab Results  Component Value Date   CHOL 107 09/29/2020   HDL 31.90 (L) 09/29/2020   LDLCALC 23 08/07/2016   LDLDIRECT 53.0 09/29/2020   TRIG 208.0 (H) 09/29/2020   CHOLHDL 3 09/29/2020    Medications Reviewed Today     Reviewed by Shane Jones, RPH-CPP (Pharmacist) on 11/23/21 at [facility] List Status: <None>   Medication Order Taking? Sig Documenting Provider Last Dose Status Informant  Accu-Chek FastClix Lancets MISC [id number] Yes USE UP TO FOUR TIMES DAILY AS DIRECTED Shane Haven, MD Taking Active   ACCU-CHEK GUIDE test strip [id number] Yes USE UP TO FOUR TIMES DAILY AS DIRECTED Shane Haven, MD Taking Active   allopurinol (ZYLOPRIM) 100 MG tablet [id number]  Take 1 tablet (100 mg total) by mouth daily. Shane Haven, MD  Active   amLODipine (NORVASC) 10 MG tablet [id number]  Take 1 tablet (10 mg total) by mouth daily. Shane Haven, MD  Active   aspirin 325 MG tablet [id number] Yes Take 325 mg by mouth daily. [provider] Taking Active   atorvastatin (LIPITOR) 80 MG tablet [id number]  Take 1 tablet (80 mg total) by mouth daily. Shane Haven, MD  Active   B Complex Vitamins (VITAMIN-B COMPLEX PO) [id number]  Take by mouth. [provider]  Active   BETA CAROTENE PO [id number]  Take by mouth. [provider]  Active   blood glucose meter kit and supplies [id number]  Dispense based on patient and insurance preference. Use up to four times daily as directed. (FOR ICD-10 E10.9, E11.9). Shane Haven, MD  Active   Calcium Carbonate-Vitamin D (CALCIUM + D PO) [id number]  Take by mouth. [provider]  Active   Continuous Blood Gluc Sensor (FREESTYLE LIBRE 2 SENSOR) [place] [id number]  by Does not apply route. [provider]  Active   Dulaglutide (TRULICITY) 4.5 WY/6.3ZC SOPN [id number]  Inject 4.5 mg into the skin once a week. Shane Haven, MD  Active   empagliflozin (JARDIANCE) 25 MG TABS tablet [id number] Yes Take 1 tablet (25 mg total) by mouth daily. Shane Haven, MD Taking Active   escitalopram (LEXAPRO) 20 MG tablet [id number]  TAKE 1 TABLET (20 MG) BY MOUTH DAILY Shane Haven, MD  Active   ezetimibe (ZETIA) 10 MG tablet [id number]  Take 1 tablet (10 mg total) by mouth daily. Shane Haven, MD  Active   fluticasone [facility]) 50 MCG/ACT nasal spray [id number] No Place 2 sprays into both nostrils daily.  Patient not taking: Reported on 11/23/2021   Shane Haven, MD Not Taking Active   furosemide (LASIX) 20 MG tablet [id number]  Take 1 tablet (20 mg total) by mouth daily. Shane Haven, MD  Active   gabapentin (NEURONTIN) 400 MG capsule [id number]  Take 1 capsule (400 mg total) by mouth 3 (three) times daily as needed. For mail order Shane Haven, MD  Active   Insulin Glargine [facility]) 100 UNIT/ML [id number] Yes Inject 20 Units into the skin daily. [provider] Taking Active   insulin lispro (HUMALOG KWIKPEN) 100 UNIT/ML KwikPen [id number] No Inject 24 Units into the skin 2 (two) times daily with a meal.  Patient not taking: Reported on 11/23/2021   Shane Haven, MD Not Taking Active   Insulin Pen Needle (PEN NEEDLES) 32G X 4 MM MISC [id number] Yes Use to inject insulin up to 4 times daily Shane Haven, MD Taking Active   losartan (COZAAR) 100 MG tablet [id number]  Take 1 tablet (100 mg total) by mouth daily. Shane Haven, MD  Active   magnesium 30 MG tablet [id number]  Take 30 mg by mouth 2 (two) times  daily. [provider]  Active   metoprolol succinate (TOPROL-XL) 100 MG 24 hr tablet [id number]  Take 1 tablet (100 mg total) by mouth daily. Shane Haven, MD  Active   Multiple Vitamin (MULTIVITAMIN) tablet [id number]  Take 1 tablet by mouth daily. [provider]  Active   pantoprazole (PROTONIX) 40 MG tablet [id number]  Take 1 tablet (40 mg total) by mouth daily. Shane Haven, MD  Active   sildenafil (VIAGRA) 50 MG tablet [id number] No Take 1 tablet (50 mg total) by mouth daily as needed for erectile dysfunction.  Patient not taking: Reported on 11/23/2021   Shane Haven, MD Not Taking Active   Zinc Sulfate (ZINC 15 PO) [id number]  Take by mouth. [provider]  Active               Assessment/Plan:   Discussed refill needs with Dr. Caryl Bis. Patient agrees to dispose of all old pill bottles that are out of date once he received new refills from [facility] order pharmacy. We will pursue assistance packaging when available.   Diabetes: - Currently uncontrolled - Reviewed instructions from Shane Jones that patient is to be taking Basaglar 20-24 units daily, Jardiance 25 mg daily, Trulicity 4.5 mg daily. Reviewed that he was supposed to discontinue Humalog. Reiterated to continue to check glucose using CGM and discuss with Shane Jones at upcoming visit.  - Will outreach Dr. Shirlyn Goltz office prior to visit to discuss pursuing patient assistance for Trulicity vs transition to [place]. Will collaborate on assistance for [place].    Hypertension: - Currently controlled - Will discuss purchasing validated upper arm BP cuff at next visit. Unlikely that hydralazine will be needed if patient is adherent to regimen; additionally, if further BP control is needed, would recommend changing losartan to a different, more potent ARB such as valsartan. Could also add spironolactone while eGFR remains >30. Will continue to collaborate with patient to  connect with nephrology as ordered. Refills sent to [facility].  Hyperlipidemia/ASCVD Risk Reduction: - Currently uncontrolled but appears patient had been taking old dose of atorvastatin - Recommend to refill atorvastatin and ezetimibe at this time. May not need ezetimibe moving forward once patient is adherence to atorvastatin 80 mg daily, will follow.   Neuropathic Pain: - Recommend to continue current regimen at this time - [facility] to [facility]  Gout: - Recommend to continue current regimen at this time - Refill sent to [facility]  GERD: - Recommend to continue current regimen at this time -  Refill sent to [facility]  Follow Up Plan: will touch base with PCP next week to make sure medications were received.   Catie Hedwig Morton, PharmD, [facility] [phone]

## 2021-11-25 ENCOUNTER — Other Ambulatory Visit (HOSPITAL_COMMUNITY): Payer: Self-pay

## 2021-11-28 ENCOUNTER — Ambulatory Visit: Payer: Medicare Other | Admitting: Family Medicine

## 2021-12-01 ENCOUNTER — Ambulatory Visit: Payer: Medicare Other | Admitting: Family Medicine

## 2021-12-01 ENCOUNTER — Telehealth: Payer: Self-pay | Admitting: Pharmacist

## 2021-12-01 NOTE — Telephone Encounter (Signed)
-----   Message from Osker Mason, [place] sent at 11/23/2021  2:18 PM EST ----- Regarding: Follow up

## 2021-12-01 NOTE — Progress Notes (Signed)
Attempted to call patient to see if he received his medications from [facility]. Call went straight to voicemail.   Appears fill is still in progress at [facility]. Will await patient return call.  Catie Hedwig Morton, PharmD, [facility] [phone]

## 2021-12-02 ENCOUNTER — Encounter: Payer: PPO | Admitting: Dietician

## 2021-12-05 DIAGNOSIS — H2511 Age-related nuclear cataract, right eye: Secondary | ICD-10-CM | POA: Diagnosis not present

## 2021-12-08 ENCOUNTER — Other Ambulatory Visit (HOSPITAL_COMMUNITY): Payer: Self-pay

## 2021-12-09 ENCOUNTER — Ambulatory Visit: Payer: PPO | Admitting: Anesthesiology

## 2021-12-09 ENCOUNTER — Encounter: Payer: Self-pay | Admitting: Ophthalmology

## 2021-12-13 NOTE — Discharge Instructions (Signed)

## 2021-12-14 NOTE — Anesthesia Preprocedure Evaluation (Signed)
Anesthesia Evaluation  Patient identified by MRN, date of birth, ID band Patient awake    Reviewed: Allergy & Precautions, NPO status , Patient's Chart, lab work & pertinent test results  Airway Mallampati: II  TM Distance: >3 FB    Comment: Large beard Dental  (+) Upper Dentures, Lower Dentures   Pulmonary sleep apnea (does not use CPAP. denies he has OSA but has been prescribed a CPAP machine) , former smoker   breath sounds clear to auscultation       Cardiovascular Exercise Tolerance: Good hypertension,  Rhythm:Regular Rate:Normal     Neuro/Psych   Anxiety Depression       GI/Hepatic   Endo/Other  diabetes, Poorly Controlled, Type 2, Insulin Dependent  BMI 30  Renal/GU Renal InsufficiencyRenal disease     Musculoskeletal Gout   Abdominal Normal abdominal exam  (+)   Peds  Hematology   Anesthesia Other Findings Pt held BP medication and insulin the past few days trying to "avoid complications with anesthesia." He presents with hypertension and a BG of 430 for which he says is normally controlled. He denies changes to his health or symptoms of illness.   Reproductive/Obstetrics                             Anesthesia Physical Anesthesia Plan  ASA:   Anesthesia Plan:    Post-op Pain Management:    Induction:   PONV Risk Score and Plan:   Airway Management Planned:   Additional Equipment:   Intra-op Plan:   Post-operative Plan:   Informed Consent:   Plan Discussed with:   Anesthesia Plan Comments: (His BP was treated with labetolol. I attempted to treat his hyperglycemia. After 15 units of lispro, patient's BG continued to increase. 10 more units of lispro were administered and the case was canceled. Pt was told to go to the ED for treatment due to severity of the hyperglycemia and the lack of response to lispro. He understood and said he would get it treated. )         Anesthesia Quick Evaluation

## 2021-12-15 ENCOUNTER — Encounter
Admission: RE | Disposition: A | Discharge status: Home / Self Care | Payer: Self-pay | Source: Home / Self Care | Attending: Ophthalmology

## 2021-12-15 ENCOUNTER — Other Ambulatory Visit: Payer: Self-pay

## 2021-12-15 ENCOUNTER — Encounter: Payer: Self-pay | Admitting: Ophthalmology

## 2021-12-15 ENCOUNTER — Emergency Department
Admission: EM | Admit: 2021-12-15 | Discharge: 2021-12-15 | Disposition: A | Discharge status: Home / Self Care | Payer: PPO | Source: Home / Self Care | Attending: Student in an Organized Health Care Education/Training Program | Admitting: Student in an Organized Health Care Education/Training Program

## 2021-12-15 ENCOUNTER — Ambulatory Visit
Admission: RE | Admit: 2021-12-15 | Discharge: 2021-12-15 | Disposition: A | Discharge status: Home / Self Care | Payer: PPO | Attending: Ophthalmology | Admitting: Ophthalmology

## 2021-12-15 DIAGNOSIS — Z539 Procedure and treatment not carried out, unspecified reason: Secondary | ICD-10-CM | POA: Insufficient documentation

## 2021-12-15 DIAGNOSIS — H2511 Age-related nuclear cataract, right eye: Secondary | ICD-10-CM | POA: Diagnosis not present

## 2021-12-15 DIAGNOSIS — E119 Type 2 diabetes mellitus without complications: Secondary | ICD-10-CM

## 2021-12-15 DIAGNOSIS — Z794 Long term (current) use of insulin: Secondary | ICD-10-CM | POA: Insufficient documentation

## 2021-12-15 DIAGNOSIS — I10 Essential (primary) hypertension: Secondary | ICD-10-CM | POA: Diagnosis not present

## 2021-12-15 DIAGNOSIS — E1165 Type 2 diabetes mellitus with hyperglycemia: Secondary | ICD-10-CM | POA: Insufficient documentation

## 2021-12-15 DIAGNOSIS — Z7984 Long term (current) use of oral hypoglycemic drugs: Secondary | ICD-10-CM | POA: Insufficient documentation

## 2021-12-15 DIAGNOSIS — R739 Hyperglycemia, unspecified: Secondary | ICD-10-CM

## 2021-12-15 LAB — CBC
HCT: 44.1 % (ref 39.0–52.0)
Hemoglobin: 16.5 g/dL (ref 13.0–17.0)
MCH: 32.7 pg (ref 26.0–34.0)
MCHC: 37.4 g/dL — ABNORMAL HIGH (ref 30.0–36.0)
MCV: 87.5 fL (ref 80.0–100.0)
Platelets: 178 10*3/uL (ref 150–400)
RBC: 5.04 MIL/uL (ref 4.22–5.81)
RDW: 12.5 % (ref 11.5–15.5)
WBC: 8 10*3/uL (ref 4.0–10.5)
nRBC: 0 % (ref 0.0–0.2)

## 2021-12-15 LAB — GLUCOSE, CAPILLARY
Glucose-Capillary: 430 mg/dL — ABNORMAL HIGH (ref 70–99)
Glucose-Capillary: 430 mg/dL — ABNORMAL HIGH (ref 70–99)
Glucose-Capillary: 447 mg/dL — ABNORMAL HIGH (ref 70–99)

## 2021-12-15 LAB — BASIC METABOLIC PANEL
Anion gap: 7 (ref 5–15)
BUN: 32 mg/dL — ABNORMAL HIGH (ref 8–23)
CO2: 24 mmol/L (ref 22–32)
Calcium: 9 mg/dL (ref 8.9–10.3)
Chloride: 102 mmol/L (ref 98–111)
Creatinine, Ser: 1.74 mg/dL — ABNORMAL HIGH (ref 0.61–1.24)
GFR, Estimated: 40 mL/min — ABNORMAL LOW (ref >60)
Glucose, Bld: 316 mg/dL — ABNORMAL HIGH (ref 70–99)
Potassium: 3.4 mmol/L — ABNORMAL LOW (ref 3.5–5.1)
Sodium: 133 mmol/L — ABNORMAL LOW (ref 135–145)

## 2021-12-15 LAB — URINALYSIS, ROUTINE W REFLEX MICROSCOPIC
Bilirubin Urine: NEGATIVE
Glucose, UA: >=500 mg/dL — AB
Ketones, ur: NEGATIVE mg/dL
Leukocytes,Ua: NEGATIVE
Nitrite: NEGATIVE
Protein, ur: 100 mg/dL — AB
Specific Gravity, Urine: 1.027 (ref 1.005–1.030)
pH: 5 (ref 5.0–8.0)

## 2021-12-15 LAB — CBG MONITORING, ED: Glucose-Capillary: 347 mg/dL — ABNORMAL HIGH (ref 70–99)

## 2021-12-15 SURGERY — PHACOEMULSIFICATION, CATARACT, WITH IOL INSERTION
Anesthesia: Monitor Anesthesia Care | Laterality: Right

## 2021-12-15 MED ORDER — INSULIN LISPRO 100 UNIT/ML IJ SOLN
10.0000 [IU] | Freq: Once | INTRAMUSCULAR | Status: AC
  Administered 2021-12-15: 10 [IU] via SUBCUTANEOUS

## 2021-12-15 MED ORDER — ARMC OPHTHALMIC DILATING DROPS
1.0000 | OPHTHALMIC | Status: DC | PRN
  Administered 2021-12-15 (×3): 1 via OPHTHALMIC

## 2021-12-15 MED ORDER — INSULIN ASPART 100 UNIT/ML IJ SOLN: 10.0000 [IU] | Freq: Once | INTRAMUSCULAR | Status: DC

## 2021-12-15 MED ORDER — LABETALOL HCL 5 MG/ML IV SOLN
10.0000 mg | Freq: Once | INTRAVENOUS | Status: AC
  Administered 2021-12-15: 10 mg via INTRAVENOUS

## 2021-12-15 MED ORDER — INSULIN LISPRO 100 UNIT/ML IJ SOLN
15.0000 [IU] | Freq: Once | INTRAMUSCULAR | Status: AC
  Administered 2021-12-15: 15 [IU] via SUBCUTANEOUS

## 2021-12-15 MED ORDER — INSULIN ASPART 100 UNIT/ML IJ SOLN: 15.0000 [IU] | Freq: Once | INTRAMUSCULAR | Status: DC

## 2021-12-15 MED ORDER — TETRACAINE HCL 0.5 % OP SOLN
1.0000 [drp] | OPHTHALMIC | Status: DC | PRN
  Administered 2021-12-15 (×2): 1 [drp] via OPHTHALMIC

## 2021-12-15 SURGICAL SUPPLY — 16 items
CANNULA ANT/CHMB 27GA (MISCELLANEOUS) IMPLANT
CATARACT SUITE SIGHTPATH (MISCELLANEOUS) ×1 IMPLANT
DISSECTOR HYDRO NUCLEUS 50X22 (MISCELLANEOUS) ×1 IMPLANT
DRSG TEGADERM 2-3/8X2-3/4 SM (GAUZE/BANDAGES/DRESSINGS) ×1 IMPLANT
GLOVE SURG SYN 7.5  E (GLOVE) ×1
GLOVE SURG SYN 7.5 E (GLOVE) ×1 IMPLANT
GLOVE SURG SYN 8.5  E (GLOVE) ×1
GLOVE SURG SYN 8.5 E (GLOVE) ×1 IMPLANT
NEEDLE FILTER BLUNT 18X1 1/2 (NEEDLE) IMPLANT
PACK VIT ANT 23G (MISCELLANEOUS) IMPLANT
RING MALYGIN (MISCELLANEOUS) IMPLANT
SUT ETHILON 10-0 CS-B-6CS-B-6 (SUTURE)
SUTURE EHLN 10-0 CS-B-6CS-B-6 (SUTURE) IMPLANT
SYR 3ML LL SCALE MARK (SYRINGE) IMPLANT
SYR 5ML LL (SYRINGE) IMPLANT
WATER STERILE IRR 250ML POUR (IV SOLUTION) ×1 IMPLANT

## 2021-12-15 NOTE — ED Notes (Signed)
Cbg 347

## 2021-12-15 NOTE — Progress Notes (Signed)
Canceled per Anesthesia due to hypertension and hyperglycemia

## 2021-12-15 NOTE — ED Provider Notes (Signed)
[facility] Provider Note    Event Date/Time   First MD Initiated Contact with Patient 12/15/21 1428     (approximate)   History   Hyperglycemia   HPI  Shane Jones is a 76 y.o. male with a history of diabetes controlled with insulin presents to the ER for evaluation from [facility] for [facility] due to elevated blood glucose.  Patient states he was told not to take his medications 2 days prior to his surgery so do not take any of his medications.  Was found to be hyperglycemic in the 400s as well as hypertensive.  He states he feels fine.  States that the surgery was canceled and was sent to the ER.  He denies any complaints or discomfort.     Physical Exam   Triage Vital Signs: ED Triage Vitals  Enc Vitals Group     BP 12/15/21 1352 (!) 153/94     Pulse Rate 12/15/21 1344 65     Resp 12/15/21 1344 20     Temp 12/15/21 1344 98.2 F (36.8 C)     Temp Source 12/15/21 1344 Oral     SpO2 12/15/21 1344 98 %     Weight 12/15/21 1346 165 lb (74.8 kg)     Height 12/15/21 1346 '5\' 5"'$  (1.651 m)     Head Circumference --      Peak Flow --      Pain Score 12/15/21 1346 0     Pain Loc --      Pain Edu? --      Excl. in [place]? --     Most recent vital signs: Vitals:   12/15/21 1344 12/15/21 1352  BP:  (!) 153/94  Pulse: 65   Resp: 20   Temp: 98.2 F (36.8 C)   SpO2: 98%      Constitutional: Alert  Eyes: Conjunctivae are normal.  Head: Atraumatic. Nose: No congestion/rhinnorhea. Mouth/Throat: Mucous membranes are moist.   Neck: Painless ROM.  Cardiovascular:   Good peripheral circulation. Respiratory: Normal respiratory effort.  No retractions.  Gastrointestinal: Soft and nontender.  Musculoskeletal:  no deformity Neurologic:  MAE spontaneously. No gross focal neurologic deficits are appreciated.  Skin:  Skin is warm, dry and intact. No rash noted. Psychiatric: Mood and affect are normal. Speech and behavior are  normal.    ED Results / Procedures / Treatments   Labs (all labs ordered are listed, but only abnormal results are displayed) Labs Reviewed  BASIC METABOLIC PANEL - Abnormal; Notable for the following components:      Result Value   Sodium 133 (*)    Potassium 3.4 (*)    Glucose, Bld 316 (*)    BUN 32 (*)    Creatinine, Ser 1.74 (*)    GFR, Estimated 40 (*)    All other components within normal limits  CBC - Abnormal; Notable for the following components:   MCHC 37.4 (*)    All other components within normal limits  URINALYSIS, ROUTINE W REFLEX MICROSCOPIC - Abnormal; Notable for the following components:   Color, Urine YELLOW (*)    APPearance CLEAR (*)    Glucose, UA >=500 (*)    Hgb urine dipstick SMALL (*)    Protein, ur 100 (*)    Bacteria, UA RARE (*)    All other components within normal limits  CBG MONITORING, ED - Abnormal; Notable for the following components:   Glucose-Capillary 347 (*)    All  other components within normal limits  CBG MONITORING, ED     EKG     RADIOLOGY    PROCEDURES:  Critical Care performed:   Procedures   MEDICATIONS ORDERED IN ED: Medications - No data to display   IMPRESSION / MDM / [place] / ED COURSE  I reviewed the triage vital signs and the nursing notes.                              Differential diagnosis includes, but is not limited to, diabetes, hyperglycemia, DKA, electrolyte abnormality, noncompliance  Patient presenting to the ER for evaluation of symptoms as described above.  Based on symptoms, risk factors and considered above differential, this presenting complaint could reflect a potentially life-threatening illness therefore the patient will be placed on continuous pulse oximetry and telemetry for monitoring.  Laboratory evaluation will be sent to evaluate for the above complaints.  Patient does not meet criteria for DKA.  His blood sugar is downtrending.  Recommended additional dose of  insulin here in the ER but patient is very familiar with controlling his diabetes and feels well and states he would prefer discharge to take his home medications as prescribed and will follow his sliding scale management.  As he is asymptomatic well-appearing does appear stable and appropriate for outpatient follow-up.     FINAL CLINICAL IMPRESSION(S) / ED DIAGNOSES   Final diagnoses:  Hyperglycemia     Rx / DC Orders   ED Discharge Orders     None        Note:  This document was prepared using Dragon voice recognition software and may include unintentional dictation errors.    Merlyn Lot, MD 12/15/21 1438

## 2021-12-15 NOTE — H&P (Addendum)
CASE CANCELLED DUE TO HIGH BLOOD SUGAR AND HIGH BLOOD PRESSURE NOT RESPONDING ADEQUATELY TO [place]   Primary Care Physician:  Leone Haven, MD Ophthalmologist: Dr. Merleen Nicely  Pre-Procedure History & Physical: HPI:  Shane Jones is a 76 y.o. male here for cataract surgery.   Past Medical History:  Diagnosis Date   Diabetes mellitus without complication ([place])    Gout    Hypertension    Sleep apnea    has CPAP, doesn't use   Ulcer of esophagus without bleeding    Wears dentures    full upper and lower    Past Surgical History:  Procedure Laterality Date   BALLOON DILATION N/A 10/22/2017   Procedure: BALLOON DILATION;  Surgeon: Lucilla Lame, MD;  Location: [facility];  Service: Endoscopy;  Laterality: N/A;   ESOPHAGOGASTRODUODENOSCOPY (EGD) WITH PROPOFOL N/A 10/22/2017   Procedure: ESOPHAGOGASTRODUODENOSCOPY (EGD) WITH Biopsy;  Surgeon: Lucilla Lame, MD;  Location: [facility];  Service: Endoscopy;  Laterality: N/A;   TONSILLECTOMY      Prior to Admission medications   Medication Sig Start Date End Date Taking? Authorizing Provider  Accu-Chek FastClix Lancets MISC USE UP TO FOUR TIMES DAILY AS DIRECTED 04/02/18  Yes Leone Haven, MD  ACCU-CHEK GUIDE test strip USE UP TO FOUR TIMES DAILY AS DIRECTED 04/02/18  Yes Leone Haven, MD  allopurinol (ZYLOPRIM) 100 MG tablet Take 1 tablet (100 mg total) by mouth daily. 11/23/21  Yes Leone Haven, MD  amLODipine (NORVASC) 10 MG tablet Take 1 tablet (10 mg total) by mouth daily. 11/23/21  Yes Leone Haven, MD  aspirin 325 MG tablet Take 325 mg by mouth daily.   Yes [provider]  atorvastatin (LIPITOR) 80 MG tablet Take 1 tablet (80 mg total) by mouth daily. 11/23/21  Yes Leone Haven, MD  B Complex Vitamins (VITAMIN-B COMPLEX PO) Take by mouth.   Yes [provider]  BETA CAROTENE PO Take by mouth.   Yes [provider]  Calcium  Carbonate-Vitamin D (CALCIUM + D PO) Take by mouth.   Yes [provider]  Dulaglutide (TRULICITY) 4.5 CH/8.8FO SOPN Inject 4.5 mg into the skin once a week. 11/07/21  Yes Leone Haven, MD  empagliflozin (JARDIANCE) 25 MG TABS tablet Take 1 tablet (25 mg total) by mouth daily. 11/07/21  Yes Leone Haven, MD  escitalopram (LEXAPRO) 20 MG tablet TAKE 1 TABLET (20 MG) BY MOUTH DAILY 11/23/21  Yes Leone Haven, MD  ezetimibe (ZETIA) 10 MG tablet Take 1 tablet (10 mg total) by mouth daily. 11/23/21  Yes Leone Haven, MD  furosemide (LASIX) 20 MG tablet Take 1 tablet (20 mg total) by mouth daily. 11/23/21  Yes Leone Haven, MD  gabapentin (NEURONTIN) 400 MG capsule Take 1 capsule (400 mg total) by mouth 3 (three) times daily as needed. For mail order 11/23/21  Yes Leone Haven, MD  losartan (COZAAR) 100 MG tablet Take 1 tablet (100 mg total) by mouth daily. 11/23/21  Yes Leone Haven, MD  magnesium 30 MG tablet Take 30 mg by mouth 2 (two) times daily.   Yes [provider]  metoprolol succinate (TOPROL-XL) 100 MG 24 hr tablet Take 1 tablet (100 mg total) by mouth daily. 11/23/21  Yes Leone Haven, MD  Multiple Vitamin (MULTIVITAMIN) tablet Take 1 tablet by mouth daily.   Yes [provider]  pantoprazole (PROTONIX) 40 MG tablet Take 1  tablet (40 mg total) by mouth daily. 11/23/21  Yes Leone Haven, MD  potassium chloride (MICRO-K) 10 MEQ CR capsule Take 10 mEq by mouth 2 (two) times daily.   Yes [provider]  Zinc Sulfate (ZINC 15 PO) Take by mouth.   Yes [provider]  blood glucose meter kit and supplies Dispense based on patient and insurance preference. Use up to four times daily as directed. (FOR ICD-10 E10.9, E11.9). 03/11/18   Leone Haven, MD  Continuous Blood Gluc Sensor (FREESTYLE LIBRE 2 SENSOR) MISC by Does not apply route.    [provider]  fluticasone (FLONASE) 50 MCG/ACT nasal  spray Place 2 sprays into both nostrils daily. Patient not taking: Reported on 11/23/2021 04/11/21   Leone Haven, MD  Insulin Glargine [facility]) 100 UNIT/ML Inject 20 Units into the skin daily. Patient not taking: Reported on 12/09/2021    [provider]  Insulin Glargine w/ Trans Port (BASAGLAR TEMPO PEN) 100 UNIT/ML SOPN Inject 20 Units into the skin once.    [provider]  insulin lispro (HUMALOG KWIKPEN) 100 UNIT/ML KwikPen Inject 24 Units into the skin 2 (two) times daily with a meal. Patient not taking: Reported on 11/23/2021 11/07/21   Leone Haven, MD  Insulin Pen Needle (PEN NEEDLES) 32G X 4 MM MISC Use to inject insulin up to 4 times daily 12/29/19   Leone Haven, MD  sildenafil (VIAGRA) 50 MG tablet Take 1 tablet (50 mg total) by mouth daily as needed for erectile dysfunction. Patient not taking: Reported on 11/23/2021 02/02/21   Leone Haven, MD    Allergies as of 11/10/2021 - Review Complete 07/29/2021  Allergen Reaction Noted   Uloric [febuxostat] Hives 10/13/2010    History reviewed. No pertinent family history.  Social History   Socioeconomic History   Marital status: Widowed    Spouse name: Not on file   Number of children: Not on file   Years of education: Not on file   Highest education level: Not on file  Occupational History   Not on file  Tobacco Use   Smoking status: Former    Types: Cigarettes    Quit date: 11/20/1978    Years since quitting: 43.0   Smokeless tobacco: Never  Vaping Use   Vaping Use: Never used  Substance and Sexual Activity   Alcohol use: Yes    Alcohol/week: 1.0 standard drink of alcohol    Types: 1 Cans of beer per week    Comment: rare   Drug use: No   Sexual activity: Not on file  Other Topics Concern   Not on file  Social History Narrative   Not on file   Social Determinants of Health   Financial Resource Strain: High Risk (11/23/2021)   Overall Financial Resource Strain  (CARDIA)    Difficulty of Paying Living Expenses: Hard  Food Insecurity: No Food Insecurity (05/23/2021)   Hunger Vital Sign    Worried About Running Out of Food in the Last Year: Never true    Ran Out of Food in the Last Year: Never true  Transportation Needs: No Transportation Needs (05/23/2021)   PRAPARE - Hydrologist (Medical): No    Lack of Transportation (Non-Medical): No  Physical Activity: Sufficiently Active (02/10/2020)   Exercise Vital Sign    Days of Exercise per Week: 3 days    Minutes of Exercise per Session: 60 min  Stress: No Stress Concern  Present (05/23/2021)   [place]    Feeling of Stress : Not at all  Social Connections: Socially Isolated (02/10/2020)   Social Connection and Isolation Panel [NHANES]    Frequency of Communication with Friends and Family: Once a week    Frequency of Social Gatherings with Friends and Family: Once a week    Attends Religious Services: Never    Marine scientist or Organizations: Yes    Attends Archivist Meetings: Never    Marital Status: Widowed  Intimate Partner Violence: Not At Risk (05/23/2021)   Humiliation, Afraid, Rape, and Kick questionnaire    Fear of Current or Ex-Partner: No    Emotionally Abused: No    Physically Abused: No    Sexually Abused: No    Review of Systems: See HPI, otherwise negative ROS  Physical Exam: Ht _0  (1.676 m)   Wt 78 kg   BMI 27.76 kg/m  General:   Alert, cooperative in NAD Head:  Normocephalic and atraumatic. Respiratory:  Normal work of breathing. Cardiovascular:  RRR  Impression/Plan: Shane Jones is here for cataract surgery.  Risks, benefits, limitations, and alternatives regarding cataract surgery have been reviewed with the patient.  Questions have been answered.  All parties agreeable.  CASE CANCELLED DUE TO HIGH BLOOD SUGAR AND HIGH BLOOD PRESSURE NOT RESPONDING  ADEQUATELY TO MEDICATIONS   Norvel Richards, MD  12/15/2021, 11:27 AM

## 2021-12-15 NOTE — ED Triage Notes (Signed)
Pt from dr office vis EMS. Pt states he is on insulin, humalog, and jardiance and did not take any for the past 2 days due to being told not to take meds prior to cataract surgery. Pt had bg of 446 at office. Pt denies N/V/D and weakness.

## 2021-12-15 NOTE — ED Notes (Signed)
Pt states he was given insulin at eye surgery center. Eye surgery canceled due to high bp and high glucose and pt sent to ED

## 2021-12-15 NOTE — ED Provider Triage Note (Signed)
Emergency Medicine Provider Triage Evaluation Note  Shane Jones , a 76 y.o. male  was evaluated in triage.  Pt complains of elevated glucose.  He went to have cataract surgery when they noted his glucose was over 400.  Patient has stopped taking all medications for 2 days.  He is on insulin.  Review of Systems  Positive:  Negative:   Physical Exam  Pulse 65   Temp 98.2 F (36.8 C) (Oral)   Resp 20   Ht '5\' 5"'$  (1.651 m)   Wt 74.8 kg   SpO2 98%   BMI 27.46 kg/m  Gen:   Awake, no distress   Resp:  Normal effort  MSK:   Moves extremities without difficulty  Other:    Medical Decision Making  Medically screening exam initiated at 1:48 PM.  Appropriate orders placed.  Irean Hong was informed that the remainder of the evaluation will be completed by another provider, this initial triage assessment does not replace that evaluation, and the importance of remaining in the ED until their evaluation is complete.     Versie Starks, PA-C 12/15/21 1349

## 2021-12-16 ENCOUNTER — Other Ambulatory Visit: Payer: PPO | Admitting: Pharmacist

## 2021-12-16 NOTE — Progress Notes (Signed)
Care Coordination Call  Noted ED visit for hyperglycemia. Contacted patient. Reviewed that he has endocrinology appointment upcoming on Wednesday. Patient expressed concerns about medication costs. Reviewed income; he is over income for Medicare Extra Help, but does continue to qualify for medication assistance through manufacturer programs.   He verbalized that per Dr. Lacie Draft instructions, he has continue Trulicity 4.5 mg weekly, Jardiance 25 mg daily, he continues to hold Humalog per instruction and stopped metformin per instruction. He notes he has been taking 16 units of Basaglar daily. Verbalized that per Dr. Lacie Draft last note, he should increase to 24 units daily. Per [employer], glucoses remain elevated in 300-400. Patient notes he will increase Basaglar dose. We reviewed colors of pens as he has a difficult time remembering the names.   Discussed that we can apply for patient assistance for GLP1, insulin, and SGLT2 for 2024. He previously was enrolled for Trulicity, Hospital doctor, and Humalog through [employer] and [employer] through [facility]. Encouraged him to let Dr. Shawnee Knapp know that we could just as easily apply for Ozempic (and [place] and Novolog) assistance from [employer] if we would prefer, given greater glycemic benefit. Patient was given my work number to give to Dr. Lacie Draft office for coordination of medication assistance applications.   Patient is also aware he needs to call [facility] to provide credit card information for the oral medications they have ready for him.   Patient agrees to call me Wednesday after endocrinology appointment for follow up.   Catie Eppie Gibson, PharmD, BCACP, CPP [facility] [phone]

## 2021-12-20 ENCOUNTER — Other Ambulatory Visit: Payer: Self-pay | Admitting: Pharmacist

## 2021-12-20 ENCOUNTER — Other Ambulatory Visit: Payer: Self-pay

## 2021-12-20 ENCOUNTER — Telehealth: Payer: Self-pay | Admitting: Family Medicine

## 2021-12-20 ENCOUNTER — Other Ambulatory Visit (HOSPITAL_COMMUNITY): Payer: Self-pay

## 2021-12-20 MED ORDER — FREESTYLE LIBRE 2 SENSOR MISC
3 refills | Status: DC
  Filled 2021-12-20: qty 6, 84d supply, fill #0

## 2021-12-20 NOTE — Progress Notes (Signed)
Care Coordination Call  Received call from patient. We discussed that with [employer] he can fill Libre 2 sensors at any pharmacy.   Resent script to [facility]. Collaborated with team to refill allopurinol, amlodipine, atorvastatin, escitalopram, losartan, metoprolol, pantoprazole, and Libre 2 sensors. They put on a charge account and mailed to him.   Catie Hedwig Morton, PharmD, [facility] [phone]

## 2021-12-20 NOTE — Telephone Encounter (Signed)
Patient contacted me too. Taking care of this. See Patient Outreach.

## 2021-12-20 NOTE — Telephone Encounter (Signed)
Pt would like to be called in regards to his medical supplies and information for a new medical supply company.

## 2021-12-21 ENCOUNTER — Other Ambulatory Visit: Payer: Self-pay

## 2021-12-21 ENCOUNTER — Other Ambulatory Visit (HOSPITAL_COMMUNITY): Payer: Self-pay

## 2021-12-21 DIAGNOSIS — I129 Hypertensive chronic kidney disease with stage 1 through stage 4 chronic kidney disease, or unspecified chronic kidney disease: Secondary | ICD-10-CM | POA: Diagnosis not present

## 2021-12-21 DIAGNOSIS — N1832 Chronic kidney disease, stage 3b: Secondary | ICD-10-CM | POA: Diagnosis not present

## 2021-12-21 DIAGNOSIS — E1140 Type 2 diabetes mellitus with diabetic neuropathy, unspecified: Secondary | ICD-10-CM | POA: Diagnosis not present

## 2021-12-21 DIAGNOSIS — E1122 Type 2 diabetes mellitus with diabetic chronic kidney disease: Secondary | ICD-10-CM | POA: Diagnosis not present

## 2021-12-21 DIAGNOSIS — E785 Hyperlipidemia, unspecified: Secondary | ICD-10-CM | POA: Diagnosis not present

## 2021-12-21 DIAGNOSIS — Z794 Long term (current) use of insulin: Secondary | ICD-10-CM | POA: Diagnosis not present

## 2021-12-21 DIAGNOSIS — E1169 Type 2 diabetes mellitus with other specified complication: Secondary | ICD-10-CM | POA: Diagnosis not present

## 2021-12-22 ENCOUNTER — Other Ambulatory Visit: Payer: PPO | Admitting: Pharmacist

## 2021-12-22 ENCOUNTER — Other Ambulatory Visit: Payer: Self-pay

## 2021-12-22 NOTE — Progress Notes (Signed)
   12/22/2021 Name: Shane Jones MRN: [id number] DOB: [birth year]  Chief Complaint  Patient presents with   Medication Management   Diabetes   Hypertension   Hyperlipidemia    KHYRE GERMOND is a 76 y.o. year old male who presented for a telephone visit.   They were referred to the pharmacist by their PCP for assistance in managing diabetes.   Subjective:  Care Team: Primary Care Provider: Leone Haven, MD ; Next Scheduled Visit: 12/27/21 Endocrinologist Hartford Poli; Next Scheduled Visit: 03/22/22  Medication Access/Adherence  Current Pharmacy:  [facility] [address] [place] [address] Phone: [phone] Fax: [place] [address] Phone: [phone] Fax: [phone]   Patient reports affordability concerns with their medications: Yes  Patient reports access/transportation concerns to their pharmacy: Yes  Patient reports adherence concerns with their medications:  Yes     Diabetes:  Current medications: Basgalar 30 units daily, Trulicity 4.5 mg weekly, Jardiance 25 mg daily Medications tried in the past: metformin - stopped due to renal function; Humalog - d/c due to hypoglycemia  Met with Dr. Hartford Poli yesterday. Increased Basaglar to 30 units daily, Continued Trulicity 4.5 mg weekly, Jardiance 25 mg daily. Will coordinate with his office on PAP. He reviewed his refrigerator today, he has 8 boxes (32 weeks) of Trulicity 4.5 mg pens, he did not have time to review expiration date but will do so before the appointment with Dr. Caryl Bis; he reviewed that he has   Medications mailed from [facility]. They have been able to put his medications on a charge account that he can pay as able. We will work on adherence packaging when the machine is on.  Hypertension:  Current medications: metoprolol succinate  100 mg daily, losartan 100 mg daily, amlodipine 10 mg daily  Of note, metoprolol succinate is Tier 2 on his insurance, while carvedilol is Tier 1.   Also as furosemide 20 mg daily, but has not been taking.   Patient does not have a validated, automated, upper arm home BP cuff. Will discuss moving forward.    Hyperlipidemia/ASCVD Risk Reduction  Current lipid lowering medications: atorvastatin 80 mg daily, prescribed ezetimibe 10 mg daily but cannot afford right now  Antiplatelet regimen: aspirin 325 mg daily - likely appropriate to reduce to 81 mg daily   Gout: Current medications: allopurinol 100 mg daily  Depression: Current medications: escitalopram 20 mg daily   Acid Reflux: Current medications: pantoprazole 40 mg daily   Assessment/Plan:   Medication Management: - Extensive education and coordination provided. Will collaborate with [facility] RN CM and SW to help support with community and mental health resources.  - Will continue to collaborate with pharmacy on coordinating fills  He will take these medications every morning: allopurinol 100 mg daily, atorvastatin 80 mg daily, losartan 100 mg daily, metoprolol succinate 100 mg daily, amlodipine 10 mg daily, escitalopram 20 mg daily, pantoprazole 40 mg daily, and Jardiance 25 mg daily.    We will collaborate with Dr. Hartford Poli on reapplying for Jardiance assistance and will discuss whether to reapply for [place] (if Trulicity available). Patient is going to count his current supply of Trulicity and Basaglar and let me know next week.    Catie Hedwig Morton, PharmD, [place], [facility] [phone]

## 2021-12-27 ENCOUNTER — Ambulatory Visit: Payer: Medicare Other | Admitting: Family Medicine

## 2021-12-30 NOTE — Progress Notes (Signed)
Mailed renewal application from [employer] for  [employer]  to patient home.   I went ahead and fax doctor portion to Dr. Hartford Poli office as well. I normally wait until I get pt portion just trying to be proactive.

## 2022-01-05 ENCOUNTER — Ambulatory Visit: Payer: Self-pay

## 2022-01-05 ENCOUNTER — Ambulatory Visit: Payer: PPO | Admitting: Family Medicine

## 2022-01-05 NOTE — Patient Outreach (Signed)
  Care Coordination   01/05/2022 Name: Shane Jones MRN: [id number] DOB: 17-Feb-1945   Care Coordination Outreach Attempts:  An unsuccessful telephone outreach was attempted for a scheduled appointment today.  Follow Up Plan:  Additional outreach attempts will be made to offer the patient care coordination information and services.   Encounter Outcome:  No Answer   Care Coordination Interventions:  No, not indicated    Quinn Plowman [facility] [place] [phone] direct line

## 2022-01-09 ENCOUNTER — Ambulatory Visit: Payer: PPO | Admitting: Family Medicine

## 2022-01-10 ENCOUNTER — Telehealth: Payer: Self-pay | Admitting: *Deleted

## 2022-01-10 NOTE — Progress Notes (Signed)
  Care Coordination Note  01/10/2022 Name: TIFFANY TALARICO MRN: [id number] DOB: July 27, 1945  CHADRIC KIMBERLEY is a 77 y.o. year old male who is a primary care patient of Leone Haven, MD and is actively engaged with the care management team. I reached out to Irean Hong by phone today to assist with re-scheduling a follow up visit with the RN Case Manager  Follow up plan: Unsuccessful telephone outreach attempt made. A HIPAA compliant phone message was left for the patient providing contact information and requesting a return call.   Julian Hy, [place] Direct Dial: [phone]

## 2022-01-11 ENCOUNTER — Telehealth: Payer: Self-pay

## 2022-01-11 NOTE — Patient Outreach (Signed)
  Care Coordination   01/11/2022 Name: Shane Jones MRN: [id number] DOB: [birth year]   Care Coordination Outreach Attempts:  An unsuccessful telephone outreach was attempted today to offer the patient information about available care coordination services as a benefit of their health plan.   Follow Up Plan:  Additional outreach attempts will be made to offer the patient care coordination information and services.   Encounter Outcome:  No Answer   Care Coordination Interventions:  No, not indicated    Daneen Schick, BSW, CDP Social Worker, Certified Dementia Practitioner [facility]  Care Coordination [phone]

## 2022-01-13 NOTE — Progress Notes (Signed)
  Care Coordination Note  01/13/2022 Name: Shane Jones MRN: [id number] DOB: 1945/02/24  Shane Jones is a 78 y.o. year old male who is a primary care patient of Leone Haven, MD and is actively engaged with the care management team. I reached out to Irean Hong by phone today to assist with re-scheduling a follow up visit with the RN Case Manager and BSW  Follow up plan: We have been unable to make contact with the patient for follow up.    Julian Hy, [place] Direct Dial: [phone]

## 2022-01-23 ENCOUNTER — Ambulatory Visit: Payer: PPO | Admitting: Dietician

## 2022-01-26 ENCOUNTER — Encounter: Payer: Self-pay | Admitting: Ophthalmology

## 2022-01-27 ENCOUNTER — Other Ambulatory Visit: Payer: Self-pay

## 2022-01-27 ENCOUNTER — Ambulatory Visit (INDEPENDENT_AMBULATORY_CARE_PROVIDER_SITE_OTHER): Payer: PPO | Admitting: Family Medicine

## 2022-01-27 ENCOUNTER — Encounter: Payer: Self-pay | Admitting: Family Medicine

## 2022-01-27 VITALS — BP 118/78 | HR 97 | Temp 99.2°F | Ht 61.0 in | Wt 171.2 lb

## 2022-01-27 DIAGNOSIS — K209 Esophagitis, unspecified without bleeding: Secondary | ICD-10-CM

## 2022-01-27 DIAGNOSIS — E785 Hyperlipidemia, unspecified: Secondary | ICD-10-CM | POA: Diagnosis not present

## 2022-01-27 DIAGNOSIS — G629 Polyneuropathy, unspecified: Secondary | ICD-10-CM

## 2022-01-27 DIAGNOSIS — Z23 Encounter for immunization: Secondary | ICD-10-CM

## 2022-01-27 DIAGNOSIS — E876 Hypokalemia: Secondary | ICD-10-CM

## 2022-01-27 DIAGNOSIS — M1A9XX1 Chronic gout, unspecified, with tophus (tophi): Secondary | ICD-10-CM | POA: Diagnosis not present

## 2022-01-27 DIAGNOSIS — I10 Essential (primary) hypertension: Secondary | ICD-10-CM

## 2022-01-27 DIAGNOSIS — E1165 Type 2 diabetes mellitus with hyperglycemia: Secondary | ICD-10-CM

## 2022-01-27 DIAGNOSIS — Z794 Long term (current) use of insulin: Secondary | ICD-10-CM

## 2022-01-27 LAB — MICROALBUMIN / CREATININE URINE RATIO
Creatinine,U: 107.4 mg/dL
Microalb Creat Ratio: 43.6 mg/g — ABNORMAL HIGH (ref 0.0–30.0)
Microalb, Ur: 46.9 mg/dL — ABNORMAL HIGH (ref 0.0–1.9)

## 2022-01-27 LAB — COMPREHENSIVE METABOLIC PANEL
ALT: 10 U/L (ref 0–53)
AST: 14 U/L (ref 0–37)
Albumin: 4.2 g/dL (ref 3.5–5.2)
Alkaline Phosphatase: 64 U/L (ref 39–117)
BUN: 28 mg/dL — ABNORMAL HIGH (ref 6–23)
CO2: 30 mEq/L (ref 19–32)
Calcium: 10.1 mg/dL (ref 8.4–10.5)
Chloride: 99 mEq/L (ref 96–112)
Creatinine, Ser: 1.58 mg/dL — ABNORMAL HIGH (ref 0.40–1.50)
GFR: 41.54 mL/min — ABNORMAL LOW (ref >60.00)
Glucose, Bld: 198 mg/dL — ABNORMAL HIGH (ref 70–99)
Potassium: 3.9 mEq/L (ref 3.5–5.1)
Sodium: 141 mEq/L (ref 135–145)
Total Bilirubin: 0.5 mg/dL (ref 0.2–1.2)
Total Protein: 6.9 g/dL (ref 6.0–8.3)

## 2022-01-27 LAB — LIPID PANEL
Cholesterol: 183 mg/dL (ref 0–200)
HDL: 38.7 mg/dL — ABNORMAL LOW (ref >39.00)
NonHDL: 144.55
Total CHOL/HDL Ratio: 5
Triglycerides: 259 mg/dL — ABNORMAL HIGH (ref 0.0–149.0)
VLDL: 51.8 mg/dL — ABNORMAL HIGH (ref 0.0–40.0)

## 2022-01-27 LAB — URIC ACID: Uric Acid, Serum: 6.8 mg/dL (ref 4.0–7.8)

## 2022-01-27 LAB — LDL CHOLESTEROL, DIRECT: Direct LDL: 105 mg/dL

## 2022-01-27 MED ORDER — METOPROLOL SUCCINATE ER 100 MG PO TB24
100.0000 mg | ORAL_TABLET | Freq: Every day | ORAL | 1 refills | Status: DC
  Filled 2022-01-27: qty 90, 90d supply, fill #0

## 2022-01-27 MED ORDER — AMLODIPINE BESYLATE 10 MG PO TABS
10.0000 mg | ORAL_TABLET | Freq: Every day | ORAL | 1 refills | Status: DC
  Filled 2022-01-27: qty 90, 90d supply, fill #0

## 2022-01-27 MED ORDER — ALLOPURINOL 100 MG PO TABS
100.0000 mg | ORAL_TABLET | Freq: Every day | ORAL | 1 refills | Status: DC
  Filled 2022-01-27: qty 90, 90d supply, fill #0

## 2022-01-27 MED ORDER — FUROSEMIDE 20 MG PO TABS
20.0000 mg | ORAL_TABLET | Freq: Every day | ORAL | 1 refills | Status: DC
  Filled 2022-01-27: qty 90, 90d supply, fill #0

## 2022-01-27 MED ORDER — ATORVASTATIN CALCIUM 80 MG PO TABS
80.0000 mg | ORAL_TABLET | Freq: Every day | ORAL | 1 refills | Status: DC
  Filled 2022-01-27: qty 90, 90d supply, fill #0

## 2022-01-27 MED ORDER — GABAPENTIN 400 MG PO CAPS
400.0000 mg | ORAL_CAPSULE | Freq: Three times a day (TID) | ORAL | 0 refills | Status: DC | PRN
  Filled 2022-01-27: qty 270, 90d supply, fill #0

## 2022-01-27 MED ORDER — LOSARTAN POTASSIUM 100 MG PO TABS
100.0000 mg | ORAL_TABLET | Freq: Every day | ORAL | 1 refills | Status: DC
  Filled 2022-01-27: qty 90, 90d supply, fill #0

## 2022-01-27 MED ORDER — PANTOPRAZOLE SODIUM 40 MG PO TBEC
40.0000 mg | DELAYED_RELEASE_TABLET | Freq: Every day | ORAL | 1 refills | Status: DC
  Filled 2022-01-27: qty 90, 90d supply, fill #0

## 2022-01-27 NOTE — Progress Notes (Signed)
[name], MD Phone: [phone]  Shane Jones is a 77 y.o. male who presents today for f/u.  HYPERTENSION Disease Monitoring Home BP Monitoring 098 systolic Chest pain- no    Dyspnea- no Medications Compliance-  taking amlodipine, losartan, metoprolol.   Edema- no BMET    Component Value Date/Time   NA 133 (L) 12/15/2021 1354   K 3.4 (L) 12/15/2021 1354   CL 102 12/15/2021 1354   CO2 24 12/15/2021 1354   GLUCOSE 316 (H) 12/15/2021 1354   BUN 32 (H) 12/15/2021 1354   CREATININE 1.74 (H) 12/15/2021 1354   CREATININE 1.15 10/07/2018 1351   CALCIUM 9.0 12/15/2021 1354   GFRNONAA 40 (L) 12/15/2021 1354   DIABETES Disease Monitoring: Blood Sugar ranges-patient was out of cgm sensors Polyuria/phagia/dipsia- no      Optho- UTD Medications: Compliance- taking trulicity, jardiance, basaglar 30 u daily, follows with endo Hypoglycemic symptoms- no  Neuropathy: Patient notes he ran out of his gabapentin about a month ago.  He notes he typically has burning in his feet at night.  The gabapentin is quite beneficial.  He tries to only take it at night.   Social History   Tobacco Use  Smoking Status Former   Types: Cigarettes   Quit date: 11/20/1978   Years since quitting: 43.2  Smokeless Tobacco Never    Current Outpatient Medications on File Prior to Visit  Medication Sig Dispense Refill   aspirin EC 81 MG tablet Take 81 mg by mouth daily. Swallow whole.     blood glucose meter kit and supplies Dispense based on patient and insurance preference. Use up to four times daily as directed. (FOR ICD-10 E10.9, E11.9). 1 each 0   Continuous Blood Gluc Sensor (FREESTYLE LIBRE 2 SENSOR) MISC Use to check glucose continuously 6 each 3   Dulaglutide (TRULICITY) 4.5 JX/9.1YN SOPN Inject 4.5 mg into the skin once a week. 8 mL 0   empagliflozin (JARDIANCE) 25 MG TABS tablet Take 1 tablet (25 mg total) by mouth daily. 90 tablet 3   escitalopram (LEXAPRO) 20 MG tablet TAKE 1 TABLET (20  MG) BY MOUTH DAILY 90 tablet 1   ezetimibe (ZETIA) 10 MG tablet Take 1 tablet (10 mg total) by mouth daily. 90 tablet 1   fluticasone (FLONASE) 50 MCG/ACT nasal spray Place 2 sprays into both nostrils daily. 16 g 6   Insulin Glargine (BASAGLAR KWIKPEN) 100 UNIT/ML Inject 30 Units into the skin daily.     Insulin Pen Needle (PEN NEEDLES) 32G X 4 MM MISC Use to inject insulin up to 4 times daily 400 each 3   No current facility-administered medications on file prior to visit.     ROS see history of present illness  Objective  Physical Exam Vitals:   01/27/22 1035 01/27/22 1051  BP: 120/80 118/78  Pulse: 97   Temp: 99.2 F (37.3 C)   SpO2: 97%     BP Readings from Last 3 Encounters:  01/27/22 118/78  12/15/21 (!) 153/94  12/15/21 (!) 169/105   Wt Readings from Last 3 Encounters:  01/27/22 171 lb 3.2 oz (77.7 kg)  12/15/21 165 lb (74.8 kg)  12/15/21 167 lb 11.2 oz (76.1 kg)    Physical Exam Constitutional:      General: He is not in acute distress.    Appearance: He is not diaphoretic.  Cardiovascular:     Rate and Rhythm: Normal rate and regular rhythm.     Heart sounds: Normal heart sounds.  Pulmonary:  Effort: Pulmonary effort is normal.     Breath sounds: Normal breath sounds.  Skin:    General: Skin is warm and dry.  Neurological:     Mental Status: He is alert.    Diabetic Foot Exam - Simple   Simple Foot Form Diabetic Foot exam was performed with the following findings: Yes 01/27/2022 10:46 AM  Visual Inspection See comments: Yes Sensation Testing Intact to touch and monofilament testing bilaterally: Yes Pulse Check Posterior Tibialis and Dorsalis pulse intact bilaterally: Yes Comments Onychomycosis on all toenails, callus at the tips of his great toenails, no other deformities, ulcerations or skin breakdown      Assessment/Plan: Please see individual problem list.  Chronic gout with tophus, unspecified cause, unspecified site Assessment &  Plan: Chronic issue.  Recheck uric acid.  He will continue allopurinol 100 mg daily.  Orders: -     Allopurinol; Take 1 tablet (100 mg total) by mouth daily.  Dispense: 90 tablet; Refill: 1 -     Uric acid -     Comprehensive metabolic panel  Essential hypertension -     amLODIPine Besylate; Take 1 tablet (10 mg total) by mouth daily.  Dispense: 90 tablet; Refill: 1 -     Losartan Potassium; Take 1 tablet (100 mg total) by mouth daily.  Dispense: 90 tablet; Refill: 1 -     Metoprolol Succinate ER; Take 1 tablet (100 mg total) by mouth daily.  Dispense: 90 tablet; Refill: 1 -     Comprehensive metabolic panel  Hyperlipidemia, unspecified hyperlipidemia type Assessment & Plan: Chronic issue.  Check lipid panel.  He will continue Lipitor 80 mg daily.  Orders: -     Atorvastatin Calcium; Take 1 tablet (80 mg total) by mouth daily.  Dispense: 90 tablet; Refill: 1 -     Lipid panel  Neuropathy Assessment & Plan: Chronic issue.  Poorly controlled.  He ran out of gabapentin.  He will restart gabapentin 400 mg nightly.  Orders: -     Gabapentin; Take 1 capsule (400 mg total) by mouth 3 (three) times daily as needed.  Dispense: 270 capsule; Refill: 0  Esophagitis -     Pantoprazole Sodium; Take 1 tablet (40 mg total) by mouth daily.  Dispense: 90 tablet; Refill: 1  Hypokalemia -     Furosemide; Take 1 tablet (20 mg total) by mouth daily.  Dispense: 90 tablet; Refill: 1  Type 2 diabetes mellitus with hyperglycemia, with long-term current use of insulin ([place]) Assessment & Plan: Chronic issue.  Poorly controlled.  This is being managed by endocrinology.  He will continue his current regimen of Basaglar 30 units daily, Trulicity 4.5 mg weekly, and Jardiance 25 mg daily.  Foot exam completed today.  Discussed monitoring his feet closely if he has any wounds that are not healing he needs to let us know.  Orders: -     Microalbumin / creatinine urine ratio    Return in about 3 months  (around 04/28/2022).   [name], MD [place]

## 2022-01-27 NOTE — Assessment & Plan Note (Signed)
Chronic issue.  Check lipid panel.  He will continue Lipitor 80 mg daily.

## 2022-01-27 NOTE — Assessment & Plan Note (Signed)
Chronic issue.  Recheck uric acid.  He will continue allopurinol 100 mg daily.

## 2022-01-27 NOTE — Patient Instructions (Signed)
Nice to see you. Will call you with your lab results. Please try taking the gabapentin 400 mg at night.

## 2022-01-27 NOTE — Assessment & Plan Note (Addendum)
Chronic issue.  Poorly controlled.  This is being managed by endocrinology.  He will continue his current regimen of Basaglar 30 units daily, Trulicity 4.5 mg weekly, and Jardiance 25 mg daily.  Foot exam completed today.  Discussed monitoring his feet closely if he has any wounds that are not healing he needs to let us know.

## 2022-01-27 NOTE — Assessment & Plan Note (Signed)
Chronic issue.  Poorly controlled.  He ran out of gabapentin.  He will restart gabapentin 400 mg nightly.

## 2022-01-30 ENCOUNTER — Encounter: Payer: Self-pay | Admitting: Ophthalmology

## 2022-01-31 NOTE — Discharge Instructions (Signed)

## 2022-02-02 ENCOUNTER — Other Ambulatory Visit: Payer: Self-pay

## 2022-02-02 ENCOUNTER — Ambulatory Visit: Payer: PPO | Admitting: Anesthesiology

## 2022-02-02 ENCOUNTER — Encounter
Admission: RE | Disposition: A | Discharge status: Home / Self Care | Payer: Self-pay | Source: Ambulatory Visit | Attending: Ophthalmology

## 2022-02-02 ENCOUNTER — Ambulatory Visit
Admission: RE | Admit: 2022-02-02 | Discharge: 2022-02-02 | Disposition: A | Discharge status: Home / Self Care | Payer: PPO | Source: Ambulatory Visit | Attending: Ophthalmology | Admitting: Ophthalmology

## 2022-02-02 ENCOUNTER — Encounter: Payer: Self-pay | Admitting: Ophthalmology

## 2022-02-02 DIAGNOSIS — Z87891 Personal history of nicotine dependence: Secondary | ICD-10-CM | POA: Insufficient documentation

## 2022-02-02 DIAGNOSIS — H2511 Age-related nuclear cataract, right eye: Secondary | ICD-10-CM | POA: Insufficient documentation

## 2022-02-02 DIAGNOSIS — I10 Essential (primary) hypertension: Secondary | ICD-10-CM | POA: Insufficient documentation

## 2022-02-02 DIAGNOSIS — Z8711 Personal history of peptic ulcer disease: Secondary | ICD-10-CM | POA: Insufficient documentation

## 2022-02-02 DIAGNOSIS — K219 Gastro-esophageal reflux disease without esophagitis: Secondary | ICD-10-CM | POA: Insufficient documentation

## 2022-02-02 DIAGNOSIS — E1136 Type 2 diabetes mellitus with diabetic cataract: Secondary | ICD-10-CM | POA: Diagnosis not present

## 2022-02-02 DIAGNOSIS — G4730 Sleep apnea, unspecified: Secondary | ICD-10-CM | POA: Insufficient documentation

## 2022-02-02 HISTORY — PX: CATARACT EXTRACTION W/PHACO: SHX586

## 2022-02-02 LAB — GLUCOSE, CAPILLARY: Glucose-Capillary: 149 mg/dL — ABNORMAL HIGH (ref 70–99)

## 2022-02-02 SURGERY — PHACOEMULSIFICATION, CATARACT, WITH IOL INSERTION
Anesthesia: Monitor Anesthesia Care | Site: Eye | Laterality: Right

## 2022-02-02 MED ORDER — SIGHTPATH DOSE#1 BSS IO SOLN
INTRAOCULAR | Status: DC | PRN
  Administered 2022-02-02: 69 mL via OPHTHALMIC

## 2022-02-02 MED ORDER — MOXIFLOXACIN HCL 0.5 % OP SOLN
OPHTHALMIC | Status: DC | PRN
  Administered 2022-02-02: .2 mL via OPHTHALMIC

## 2022-02-02 MED ORDER — SIGHTPATH DOSE#1 BSS IO SOLN
INTRAOCULAR | Status: DC | PRN
  Administered 2022-02-02: 15 mL

## 2022-02-02 MED ORDER — MIDAZOLAM HCL 2 MG/2ML IJ SOLN
INTRAMUSCULAR | Status: DC | PRN
  Administered 2022-02-02: 1 mg via INTRAVENOUS

## 2022-02-02 MED ORDER — LACTATED RINGERS IV SOLN: INTRAVENOUS | Status: DC

## 2022-02-02 MED ORDER — TETRACAINE HCL 0.5 % OP SOLN
1.0000 [drp] | OPHTHALMIC | Status: DC | PRN
  Administered 2022-02-02 (×3): 1 [drp] via OPHTHALMIC

## 2022-02-02 MED ORDER — SIGHTPATH DOSE#1 NA HYALUR & NA CHOND-NA HYALUR IO KIT
PACK | INTRAOCULAR | Status: DC | PRN
  Administered 2022-02-02: 1 via OPHTHALMIC

## 2022-02-02 MED ORDER — ARMC OPHTHALMIC DILATING DROPS
1.0000 | OPHTHALMIC | Status: DC | PRN
  Administered 2022-02-02 (×3): 1 via OPHTHALMIC

## 2022-02-02 MED ORDER — FENTANYL CITRATE (PF) 100 MCG/2ML IJ SOLN
INTRAMUSCULAR | Status: DC | PRN
  Administered 2022-02-02: 50 ug via INTRAVENOUS

## 2022-02-02 MED ORDER — LIDOCAINE HCL (PF) 2 % IJ SOLN
INTRAOCULAR | Status: DC | PRN
  Administered 2022-02-02: 1 mL via INTRAOCULAR

## 2022-02-02 SURGICAL SUPPLY — 12 items
CATARACT SUITE SIGHTPATH (MISCELLANEOUS) ×1 IMPLANT
DISSECTOR HYDRO NUCLEUS 50X22 (MISCELLANEOUS) ×1 IMPLANT
DRSG TEGADERM 2-3/8X2-3/4 SM (GAUZE/BANDAGES/DRESSINGS) ×1 IMPLANT
FEE CATARACT SUITE SIGHTPATH (MISCELLANEOUS) ×1 IMPLANT
GLOVE SURG SYN 7.5  E (GLOVE) ×1
GLOVE SURG SYN 7.5 E (GLOVE) ×1 IMPLANT
GLOVE SURG SYN 7.5 PF PI (GLOVE) ×1 IMPLANT
GLOVE SURG SYN 8.5  E (GLOVE) ×1
GLOVE SURG SYN 8.5 E (GLOVE) ×1 IMPLANT
GLOVE SURG SYN 8.5 PF PI (GLOVE) ×1 IMPLANT
LENS IOL TECNIS EYHANCE 21.5 (Intraocular Lens) IMPLANT
WATER STERILE IRR 250ML POUR (IV SOLUTION) ×1 IMPLANT

## 2022-02-02 NOTE — Transfer of Care (Signed)
Immediate Anesthesia Transfer of Care Note  [name]  Procedure(s) Performed: CATARACT EXTRACTION PHACO AND INTRAOCULAR LENS PLACEMENT (IOC) RIGHT DIABETIC  6.48  00:45.6 (Right: Eye)  Patient Location: PACU  Anesthesia Type: MAC  Level of Consciousness: awake, alert  and patient cooperative  Airway and Oxygen Therapy: Patient Spontanous Breathing and Patient connected to supplemental oxygen  Post-op Assessment: Post-op Vital signs reviewed, Patient's Cardiovascular Status Stable, Respiratory Function Stable, Patent Airway and No signs of Nausea or vomiting  Post-op Vital Signs: Reviewed and stable  Complications: No notable events documented.

## 2022-02-02 NOTE — H&P (Signed)
[facility]   Primary Care Physician:  Leone Haven, MD Ophthalmologist: Dr. Merleen Nicely  Pre-Procedure History & Physical: HPI:  Shane Jones is a 77 y.o. male here for cataract surgery.   Past Medical History:  Diagnosis Date   Diabetes mellitus without complication ([place])    Gout    Hypertension    Sleep apnea    has CPAP, doesn't use   Ulcer of esophagus without bleeding    Wears dentures    full upper and lower    Past Surgical History:  Procedure Laterality Date   BALLOON DILATION N/A 10/22/2017   Procedure: BALLOON DILATION;  Surgeon: Lucilla Lame, MD;  Location: [place];  Service: Endoscopy;  Laterality: N/A;   ESOPHAGOGASTRODUODENOSCOPY (EGD) WITH PROPOFOL N/A 10/22/2017   Procedure: ESOPHAGOGASTRODUODENOSCOPY (EGD) WITH Biopsy;  Surgeon: Lucilla Lame, MD;  Location: [place];  Service: Endoscopy;  Laterality: N/A;   TONSILLECTOMY      Prior to Admission medications   Medication Sig Start Date End Date Taking? Authorizing Provider  allopurinol (ZYLOPRIM) 100 MG tablet Take 1 tablet (100 mg total) by mouth daily. 01/27/22  Yes Leone Haven, MD  amLODipine (NORVASC) 10 MG tablet Take 1 tablet (10 mg total) by mouth daily. 01/27/22  Yes Leone Haven, MD  aspirin EC 81 MG tablet Take 81 mg by mouth daily. Swallow whole.   Yes [provider]  atorvastatin (LIPITOR) 80 MG tablet Take 1 tablet (80 mg total) by mouth daily. 01/27/22  Yes Leone Haven, MD  Dulaglutide (TRULICITY) 4.5 JG/2.8ZM SOPN Inject 4.5 mg into the skin once a week. 11/07/21  Yes Leone Haven, MD  empagliflozin (JARDIANCE) 25 MG TABS tablet Take 1 tablet (25 mg total) by mouth daily. 11/07/21  Yes Leone Haven, MD  escitalopram (LEXAPRO) 20 MG tablet TAKE 1 TABLET (20 MG) BY MOUTH DAILY 11/23/21  Yes Leone Haven, MD  ezetimibe (ZETIA) 10 MG tablet Take 1 tablet (10 mg total) by mouth daily. 11/23/21  Yes Leone Haven, MD   fluticasone (FLONASE) 50 MCG/ACT nasal spray Place 2 sprays into both nostrils daily. 04/11/21  Yes Leone Haven, MD  furosemide (LASIX) 20 MG tablet Take 1 tablet (20 mg total) by mouth daily. 01/27/22  Yes Leone Haven, MD  gabapentin (NEURONTIN) 400 MG capsule Take 1 capsule (400 mg total) by mouth 3 (three) times daily as needed. 01/27/22  Yes Leone Haven, MD  Insulin Glargine [facility] KWIKPEN) 100 UNIT/ML Inject 30 Units into the skin daily.   Yes [provider]  losartan (COZAAR) 100 MG tablet Take 1 tablet (100 mg total) by mouth daily. 01/27/22  Yes Leone Haven, MD  metoprolol succinate (TOPROL-XL) 100 MG 24 hr tablet Take 1 tablet (100 mg total) by mouth daily. 01/27/22  Yes Leone Haven, MD  pantoprazole (PROTONIX) 40 MG tablet Take 1 tablet (40 mg total) by mouth daily. 01/27/22  Yes Leone Haven, MD  blood glucose meter kit and supplies Dispense based on patient and insurance preference. Use up to four times daily as directed. (FOR ICD-10 E10.9, E11.9). 03/11/18   Leone Haven, MD  Continuous Blood Gluc Sensor (FREESTYLE LIBRE 2 SENSOR) MISC Use to check glucose continuously 12/20/21   Kaylyn Layer T, RPH-CPP  Insulin Pen Needle (PEN NEEDLES) 32G X 4 MM MISC Use to inject insulin up to 4 times daily 12/29/19   Leone Haven, MD  Allergies as of 12/19/2021 - Review Complete 12/15/2021  Allergen Reaction Noted   Uloric [febuxostat] Hives 10/13/2010    History reviewed. No pertinent family history.  Social History   Socioeconomic History   Marital status: Widowed    Spouse name: Not on file   Number of children: Not on file   Years of education: Not on file   Highest education level: Not on file  Occupational History   Not on file  Tobacco Use   Smoking status: Former    Types: Cigarettes    Quit date: 11/20/1978    Years since quitting: 43.2   Smokeless tobacco: Never  Vaping Use   Vaping Use: Never used   Substance and Sexual Activity   Alcohol use: Not Currently    Alcohol/week: 1.0 standard drink of alcohol    Types: 1 Cans of beer per week    Comment: rare   Drug use: No   Sexual activity: Not on file  Other Topics Concern   Not on file  Social History Narrative   Not on file   Social Determinants of Health   Financial Resource Strain: High Risk (12/22/2021)   Overall Financial Resource Strain (CARDIA)    Difficulty of Paying Living Expenses: Hard  Food Insecurity: No Food Insecurity (05/23/2021)   Hunger Vital Sign    Worried About Running Out of Food in the Last Year: Never true    Ran Out of Food in the Last Year: Never true  Transportation Needs: No Transportation Needs (05/23/2021)   PRAPARE - Hydrologist (Medical): No    Lack of Transportation (Non-Medical): No  Physical Activity: Sufficiently Active (02/10/2020)   Exercise Vital Sign    Days of Exercise per Week: 3 days    Minutes of Exercise per Session: 60 min  Stress: No Stress Concern Present (05/23/2021)   [place]    Feeling of Stress : Not at all  Social Connections: Socially Isolated (02/10/2020)   Social Connection and Isolation Panel [NHANES]    Frequency of Communication with Friends and Family: Once a week    Frequency of Social Gatherings with Friends and Family: Once a week    Attends Religious Services: Never    Marine scientist or Organizations: Yes    Attends Archivist Meetings: Never    Marital Status: Widowed  Intimate Partner Violence: Not At Risk (05/23/2021)   Humiliation, Afraid, Rape, and Kick questionnaire    Fear of Current or Ex-Partner: No    Emotionally Abused: No    Physically Abused: No    Sexually Abused: No    Review of Systems: See HPI, otherwise negative ROS  Physical Exam: Ht '5\' 5"'$  (1.651 m)   Wt 74.8 kg   BMI 27.46 kg/m  General:   Alert, cooperative in  NAD Head:  Normocephalic and atraumatic. Respiratory:  Normal work of breathing. Cardiovascular:  RRR  Impression/Plan: Shane Jones is here for cataract surgery.  Risks, benefits, limitations, and alternatives regarding cataract surgery have been reviewed with the patient.  Questions have been answered.  All parties agreeable.   Norvel Richards, MD  02/02/2022, 11:30 AM

## 2022-02-02 NOTE — Anesthesia Preprocedure Evaluation (Signed)
Anesthesia Evaluation  Patient identified by MRN, date of birth, ID band Patient awake    Reviewed: Allergy & Precautions, NPO status , Patient's Chart, lab work & pertinent test results  History of Anesthesia Complications Negative for: history of anesthetic complications  Airway Mallampati: III  TM Distance: <3 FB Neck ROM: full    Dental  (+) Upper Dentures, Lower Dentures   Pulmonary neg shortness of breath, sleep apnea , former smoker   Pulmonary exam normal        Cardiovascular Exercise Tolerance: Good hypertension, (-) angina Normal cardiovascular exam     Neuro/Psych  Headaches  negative psych ROS   GI/Hepatic Neg liver ROS, PUD,GERD  Controlled,,  Endo/Other  negative endocrine ROSdiabetes    Renal/GU Renal disease     Musculoskeletal   Abdominal   Peds  Hematology negative hematology ROS (+)   Anesthesia Other Findings Past Medical History: No date: Diabetes mellitus without complication (HCC) No date: Gout No date: Hypertension No date: Sleep apnea     Comment:  has CPAP, doesn't use No date: Ulcer of esophagus without bleeding No date: Wears dentures     Comment:  full upper and lower  Past Surgical History: 10/22/2017: BALLOON DILATION; N/A     Comment:  Procedure: BALLOON DILATION;  Surgeon: Lucilla Lame, MD;              Location: [place];  Service: Endoscopy;                Laterality: N/A; 10/22/2017: ESOPHAGOGASTRODUODENOSCOPY (EGD) WITH PROPOFOL; N/A     Comment:  Procedure: ESOPHAGOGASTRODUODENOSCOPY (EGD) WITH Biopsy;              Surgeon: Lucilla Lame, MD;  Location: [place];  Service: Endoscopy;  Laterality: N/A; No date: TONSILLECTOMY  BMI    Body Mass Index: 27.46 kg/m      Reproductive/Obstetrics negative OB ROS                             Anesthesia Physical Anesthesia Plan  ASA: 3  Anesthesia Plan: MAC    Post-op Pain Management:    Induction: Intravenous  PONV Risk Score and Plan:   Airway Management Planned: Natural Airway and Nasal Cannula  Additional Equipment:   Intra-op Plan:   Post-operative Plan:   Informed Consent: I have reviewed the patients History and Physical, chart, labs and discussed the procedure including the risks, benefits and alternatives for the proposed anesthesia with the patient or authorized representative who has indicated his/her understanding and acceptance.     Dental Advisory Given  Plan Discussed with: Anesthesiologist, CRNA and Surgeon  Anesthesia Plan Comments: (Patient consented for risks of anesthesia including but not limited to:  - adverse reactions to medications - damage to eyes, teeth, lips or other oral mucosa - nerve damage due to positioning  - sore throat or hoarseness - Damage to heart, brain, nerves, lungs, other parts of body or loss of life  Patient voiced understanding.)       Anesthesia Quick Evaluation

## 2022-02-02 NOTE — Anesthesia Postprocedure Evaluation (Signed)
Anesthesia Post Note  [name]  Procedure(s) Performed: CATARACT EXTRACTION PHACO AND INTRAOCULAR LENS PLACEMENT (IOC) RIGHT DIABETIC  6.48  00:45.6 (Right: Eye)  Patient location during evaluation: PACU Anesthesia Type: MAC Level of consciousness: awake and alert Pain management: pain level controlled Vital Signs Assessment: post-procedure vital signs reviewed and stable Respiratory status: spontaneous breathing, nonlabored ventilation and respiratory function stable Cardiovascular status: blood pressure returned to baseline and stable Postop Assessment: no apparent nausea or vomiting Anesthetic complications: no   No notable events documented.   Last Vitals:  Vitals:   02/02/22 1301 02/02/22 1307  BP: 138/78 (!) 134/91  Pulse: (!) 57 61  Resp: 14 15  Temp: 36.5 C 36.5 C  SpO2: 97% 95%    Last Pain:  Vitals:   02/02/22 1307  PainSc: 0-No pain                 Precious Haws Oktober Glazer

## 2022-02-02 NOTE — Op Note (Signed)
OPERATIVE NOTE  Shane Jones [id number] 02/02/2022   PREOPERATIVE DIAGNOSIS: Nuclear sclerotic cataract right eye. H25.11   POSTOPERATIVE DIAGNOSIS: Nuclear sclerotic cataract right eye. H25.11   PROCEDURE:  Phacoemusification with posterior chamber intraocular lens placement of the right eye  Ultrasound time: Procedure(s) with comments: CATARACT EXTRACTION PHACO AND INTRAOCULAR LENS PLACEMENT (IOC) RIGHT DIABETIC  6.48  00:45.6 (Right) - Diabetic  LENS:   Implant Name Type Inv. Item Serial No. Manufacturer Lot No. LRB No. Used Action  LENS IOL TECNIS EYHANCE 21.5 - [id number] Intraocular Lens LENS IOL TECNIS EYHANCE 21.5 [id number] [employer]  Right 1 Implanted      SURGEON:  Courtney Heys. Lazarus Salines, MD   ANESTHESIA:  Topical with tetracaine drops, augmented with 1% preservative-free intracameral lidocaine.   COMPLICATIONS:  None.   DESCRIPTION OF PROCEDURE:  The patient was identified in the holding room and transported to the operating room and placed in the supine position under the operating microscope.  The right eye was identified as the operative eye, which was prepped and draped in the usual sterile ophthalmic fashion.   A 1 millimeter clear-corneal paracentesis was made superotemporally. Preservative-free 1% lidocaine mixed with 1:1,000 bisulfite-free aqueous solution of epinephrine was injected into the anterior chamber. The anterior chamber was then filled with Viscoat viscoelastic. A 2.4 millimeter keratome was used to make a clear-corneal incision inferotemporally. A curvilinear capsulorrhexis was made with a cystotome and capsulorrhexis forceps. Balanced salt solution was used to hydrodissect and hydrodelineate the nucleus. Phacoemulsification was then used to remove the lens nucleus and epinucleus. The remaining cortex was then removed using the irrigation and aspiration handpiece. Provisc was then placed into the capsular bag to distend it for lens placement. A +21.50 D DIB00  intraocular lens was then injected into the capsular bag. The remaining viscoelastic was aspirated.   Wounds were hydrated with balanced salt solution.  The anterior chamber was inflated to a physiologic pressure with balanced salt solution.  No wound leaks were noted. Vigamox was injected intracamerally.  Timolol and Brimonidine drops were applied to the eye.  The patient was taken to the recovery room in stable condition without complications of anesthesia or surgery.  [name] 02/02/2022, 1:00 PM

## 2022-02-03 ENCOUNTER — Other Ambulatory Visit: Payer: Self-pay

## 2022-02-03 ENCOUNTER — Encounter: Payer: Self-pay | Admitting: Ophthalmology

## 2022-02-15 NOTE — Discharge Instructions (Signed)

## 2022-02-16 ENCOUNTER — Ambulatory Visit: Payer: PPO | Admitting: General Practice

## 2022-02-16 ENCOUNTER — Encounter
Admission: RE | Disposition: A | Discharge status: Home / Self Care | Payer: Self-pay | Source: Home / Self Care | Attending: Ophthalmology

## 2022-02-16 ENCOUNTER — Ambulatory Visit
Admission: RE | Admit: 2022-02-16 | Discharge: 2022-02-16 | Disposition: A | Discharge status: Home / Self Care | Payer: PPO | Attending: Ophthalmology | Admitting: Ophthalmology

## 2022-02-16 ENCOUNTER — Other Ambulatory Visit: Payer: Self-pay

## 2022-02-16 ENCOUNTER — Encounter: Payer: Self-pay | Admitting: Ophthalmology

## 2022-02-16 DIAGNOSIS — Z7985 Long-term (current) use of injectable non-insulin antidiabetic drugs: Secondary | ICD-10-CM | POA: Insufficient documentation

## 2022-02-16 DIAGNOSIS — E119 Type 2 diabetes mellitus without complications: Secondary | ICD-10-CM

## 2022-02-16 DIAGNOSIS — H2512 Age-related nuclear cataract, left eye: Secondary | ICD-10-CM | POA: Insufficient documentation

## 2022-02-16 DIAGNOSIS — Z794 Long term (current) use of insulin: Secondary | ICD-10-CM | POA: Insufficient documentation

## 2022-02-16 DIAGNOSIS — E1136 Type 2 diabetes mellitus with diabetic cataract: Secondary | ICD-10-CM | POA: Insufficient documentation

## 2022-02-16 DIAGNOSIS — G4730 Sleep apnea, unspecified: Secondary | ICD-10-CM | POA: Insufficient documentation

## 2022-02-16 DIAGNOSIS — K219 Gastro-esophageal reflux disease without esophagitis: Secondary | ICD-10-CM | POA: Diagnosis not present

## 2022-02-16 DIAGNOSIS — M109 Gout, unspecified: Secondary | ICD-10-CM | POA: Diagnosis not present

## 2022-02-16 DIAGNOSIS — I10 Essential (primary) hypertension: Secondary | ICD-10-CM | POA: Diagnosis not present

## 2022-02-16 DIAGNOSIS — Z7984 Long term (current) use of oral hypoglycemic drugs: Secondary | ICD-10-CM | POA: Insufficient documentation

## 2022-02-16 HISTORY — PX: CATARACT EXTRACTION W/PHACO: SHX586

## 2022-02-16 LAB — GLUCOSE, CAPILLARY: Glucose-Capillary: 123 mg/dL — ABNORMAL HIGH (ref 70–99)

## 2022-02-16 SURGERY — PHACOEMULSIFICATION, CATARACT, WITH IOL INSERTION
Anesthesia: Monitor Anesthesia Care | Site: Eye | Laterality: Left

## 2022-02-16 MED ORDER — ARMC OPHTHALMIC DILATING DROPS
1.0000 | OPHTHALMIC | Status: DC | PRN
  Administered 2022-02-16 (×3): 1 via OPHTHALMIC

## 2022-02-16 MED ORDER — SIGHTPATH DOSE#1 BSS IO SOLN
INTRAOCULAR | Status: DC | PRN
  Administered 2022-02-16: 15 mL

## 2022-02-16 MED ORDER — MIDAZOLAM HCL 2 MG/2ML IJ SOLN
INTRAMUSCULAR | Status: DC | PRN
  Administered 2022-02-16: 1 mg via INTRAVENOUS

## 2022-02-16 MED ORDER — SIGHTPATH DOSE#1 NA HYALUR & NA CHOND-NA HYALUR IO KIT
PACK | INTRAOCULAR | Status: DC | PRN
  Administered 2022-02-16: 1 via OPHTHALMIC

## 2022-02-16 MED ORDER — TETRACAINE HCL 0.5 % OP SOLN
1.0000 [drp] | OPHTHALMIC | Status: DC | PRN
  Administered 2022-02-16 (×3): 1 [drp] via OPHTHALMIC

## 2022-02-16 MED ORDER — SIGHTPATH DOSE#1 BSS IO SOLN
INTRAOCULAR | Status: DC | PRN
  Administered 2022-02-16: 68 mL via OPHTHALMIC

## 2022-02-16 MED ORDER — SODIUM CHLORIDE 0.9% FLUSH
INTRAVENOUS | Status: DC | PRN
  Administered 2022-02-16: 10 mL via INTRAVENOUS

## 2022-02-16 MED ORDER — FENTANYL CITRATE (PF) 100 MCG/2ML IJ SOLN
INTRAMUSCULAR | Status: DC | PRN
  Administered 2022-02-16: 50 ug via INTRAVENOUS

## 2022-02-16 MED ORDER — LIDOCAINE HCL (PF) 2 % IJ SOLN
INTRAOCULAR | Status: DC | PRN
  Administered 2022-02-16: 1 mL via INTRAOCULAR

## 2022-02-16 MED ORDER — MOXIFLOXACIN HCL 0.5 % OP SOLN
OPHTHALMIC | Status: DC | PRN
  Administered 2022-02-16: .2 mL via OPHTHALMIC

## 2022-02-16 MED ORDER — BRIMONIDINE TARTRATE-TIMOLOL 0.2-0.5 % OP SOLN
OPHTHALMIC | Status: DC | PRN
  Administered 2022-02-16: 1 [drp] via OPHTHALMIC

## 2022-02-16 SURGICAL SUPPLY — 12 items
CATARACT SUITE SIGHTPATH (MISCELLANEOUS) ×1 IMPLANT
DISSECTOR HYDRO NUCLEUS 50X22 (MISCELLANEOUS) ×1 IMPLANT
DRSG TEGADERM 2-3/8X2-3/4 SM (GAUZE/BANDAGES/DRESSINGS) ×1 IMPLANT
FEE CATARACT SUITE SIGHTPATH (MISCELLANEOUS) ×1 IMPLANT
GLOVE SURG SYN 7.5  E (GLOVE) ×1
GLOVE SURG SYN 7.5 E (GLOVE) ×1 IMPLANT
GLOVE SURG SYN 7.5 PF PI (GLOVE) ×1 IMPLANT
GLOVE SURG SYN 8.5  E (GLOVE) ×1
GLOVE SURG SYN 8.5 E (GLOVE) ×1 IMPLANT
GLOVE SURG SYN 8.5 PF PI (GLOVE) ×1 IMPLANT
LENS IOL TECNIS EYHANCE 23.0 (Intraocular Lens) IMPLANT
WATER STERILE IRR 250ML POUR (IV SOLUTION) ×1 IMPLANT

## 2022-02-16 NOTE — Anesthesia Preprocedure Evaluation (Signed)
Anesthesia Evaluation  Patient identified by MRN, date of birth, ID band Patient awake    Reviewed: Allergy & Precautions, NPO status , Patient's Chart, lab work & pertinent test results  History of Anesthesia Complications Negative for: history of anesthetic complications  Airway Mallampati: III  TM Distance: <3 FB Neck ROM: full    Dental  (+) Upper Dentures, Lower Dentures   Pulmonary neg shortness of breath, sleep apnea , former smoker   Pulmonary exam normal        Cardiovascular Exercise Tolerance: Good hypertension, (-) angina Normal cardiovascular exam     Neuro/Psych  Headaches  negative psych ROS   GI/Hepatic Neg liver ROS, PUD,GERD  Controlled,,  Endo/Other  negative endocrine ROSdiabetes    Renal/GU Renal disease     Musculoskeletal   Abdominal   Peds  Hematology negative hematology ROS (+)   Anesthesia Other Findings Past Medical History: No date: Diabetes mellitus without complication (HCC) No date: Gout No date: Hypertension No date: Sleep apnea     Comment:  has CPAP, doesn't use No date: Ulcer of esophagus without bleeding No date: Wears dentures     Comment:  full upper and lower  Past Surgical History: 10/22/2017: BALLOON DILATION; N/A     Comment:  Procedure: BALLOON DILATION;  Surgeon: Lucilla Lame, MD;              Location: [place];  Service: Endoscopy;                Laterality: N/A; 10/22/2017: ESOPHAGOGASTRODUODENOSCOPY (EGD) WITH PROPOFOL; N/A     Comment:  Procedure: ESOPHAGOGASTRODUODENOSCOPY (EGD) WITH Biopsy;              Surgeon: Lucilla Lame, MD;  Location: [place];  Service: Endoscopy;  Laterality: N/A; No date: TONSILLECTOMY  BMI    Body Mass Index: 27.46 kg/m      Reproductive/Obstetrics negative OB ROS                             Anesthesia Physical Anesthesia Plan  ASA: 3  Anesthesia Plan: MAC    Post-op Pain Management:    Induction: Intravenous  PONV Risk Score and Plan:   Airway Management Planned: Natural Airway and Nasal Cannula  Additional Equipment:   Intra-op Plan:   Post-operative Plan:   Informed Consent:      Dental Advisory Given  Plan Discussed with: Anesthesiologist, CRNA and Surgeon  Anesthesia Plan Comments: (Patient consented for risks of anesthesia including but not limited to:  - adverse reactions to medications - damage to eyes, teeth, lips or other oral mucosa - nerve damage due to positioning  - sore throat or hoarseness - Damage to heart, brain, nerves, lungs, other parts of body or loss of life  Patient voiced understanding.)       Anesthesia Quick Evaluation

## 2022-02-16 NOTE — H&P (Signed)
[facility]   Primary Care Physician:  Leone Haven, MD Ophthalmologist: Dr. Merleen Nicely  Pre-Procedure History & Physical: HPI:  Shane Jones is a 77 y.o. male here for cataract surgery.   Past Medical History:  Diagnosis Date   Diabetes mellitus without complication ([place])    Gout    Hypertension    Sleep apnea    has CPAP, doesn't use   Ulcer of esophagus without bleeding    Wears dentures    full upper and lower    Past Surgical History:  Procedure Laterality Date   BALLOON DILATION N/A 10/22/2017   Procedure: BALLOON DILATION;  Surgeon: Lucilla Lame, MD;  Location: [place];  Service: Endoscopy;  Laterality: N/A;   CATARACT EXTRACTION W/PHACO Right 02/02/2022   Procedure: CATARACT EXTRACTION PHACO AND INTRAOCULAR LENS PLACEMENT (IOC) RIGHT DIABETIC  6.48  00:45.6;  Surgeon: Norvel Richards, MD;  Location: [place];  Service: Ophthalmology;  Laterality: Right;  Diabetic   ESOPHAGOGASTRODUODENOSCOPY (EGD) WITH PROPOFOL N/A 10/22/2017   Procedure: ESOPHAGOGASTRODUODENOSCOPY (EGD) WITH Biopsy;  Surgeon: Lucilla Lame, MD;  Location: [place];  Service: Endoscopy;  Laterality: N/A;   TONSILLECTOMY      Prior to Admission medications   Medication Sig Start Date End Date Taking? Authorizing Provider  allopurinol (ZYLOPRIM) 100 MG tablet Take 1 tablet (100 mg total) by mouth daily. 01/27/22   Leone Haven, MD  amLODipine (NORVASC) 10 MG tablet Take 1 tablet (10 mg total) by mouth daily. 01/27/22   Leone Haven, MD  aspirin EC 81 MG tablet Take 81 mg by mouth daily. Swallow whole.    [provider]  atorvastatin (LIPITOR) 80 MG tablet Take 1 tablet (80 mg total) by mouth daily. 01/27/22   Leone Haven, MD  blood glucose meter kit and supplies Dispense based on patient and insurance preference. Use up to four times daily as directed. (FOR ICD-10 E10.9, E11.9). 03/11/18   Leone Haven, MD  Continuous  Blood Gluc Sensor (FREESTYLE LIBRE 2 SENSOR) MISC Use to check glucose continuously 12/20/21   Kaylyn Layer T, RPH-CPP  Dulaglutide (TRULICITY) 4.5 0000000 SOPN Inject 4.5 mg into the skin once a week. 11/07/21   Leone Haven, MD  empagliflozin (JARDIANCE) 25 MG TABS tablet Take 1 tablet (25 mg total) by mouth daily. 11/07/21   Leone Haven, MD  escitalopram (LEXAPRO) 20 MG tablet TAKE 1 TABLET (20 MG) BY MOUTH DAILY 11/23/21   Leone Haven, MD  ezetimibe (ZETIA) 10 MG tablet Take 1 tablet (10 mg total) by mouth daily. 11/23/21   Leone Haven, MD  fluticasone (FLONASE) 50 MCG/ACT nasal spray Place 2 sprays into both nostrils daily. 04/11/21   Leone Haven, MD  furosemide (LASIX) 20 MG tablet Take 1 tablet (20 mg total) by mouth daily. 01/27/22   Leone Haven, MD  gabapentin (NEURONTIN) 400 MG capsule Take 1 capsule (400 mg total) by mouth 3 (three) times daily as needed. 01/27/22   Leone Haven, MD  Insulin Glargine [facility]) 100 UNIT/ML Inject 30 Units into the skin daily.    [provider]  Insulin Pen Needle (PEN NEEDLES) 32G X 4 MM MISC Use to inject insulin up to 4 times daily 12/29/19   Leone Haven, MD  losartan (COZAAR) 100 MG tablet Take 1 tablet (100 mg total) by mouth daily. 01/27/22   Leone Haven, MD  metoprolol succinate (TOPROL-XL) 100 MG 24  hr tablet Take 1 tablet (100 mg total) by mouth daily. 01/27/22   Leone Haven, MD  pantoprazole (PROTONIX) 40 MG tablet Take 1 tablet (40 mg total) by mouth daily. 01/27/22   Leone Haven, MD    Allergies as of 11/10/2021 - Review Complete 07/29/2021  Allergen Reaction Noted   Uloric [febuxostat] Hives 10/13/2010    History reviewed. No pertinent family history.  Social History   Socioeconomic History   Marital status: Widowed    Spouse name: Not on file   Number of children: Not on file   Years of education: Not on file   Highest education level: Not  on file  Occupational History   Not on file  Tobacco Use   Smoking status: Former    Types: Cigarettes    Quit date: 11/20/1978    Years since quitting: 43.2   Smokeless tobacco: Never  Vaping Use   Vaping Use: Never used  Substance and Sexual Activity   Alcohol use: Not Currently    Alcohol/week: 1.0 standard drink of alcohol    Types: 1 Cans of beer per week    Comment: rare   Drug use: No   Sexual activity: Not on file  Other Topics Concern   Not on file  Social History Narrative   Not on file   Social Determinants of Health   Financial Resource Strain: High Risk (12/22/2021)   Overall Financial Resource Strain (CARDIA)    Difficulty of Paying Living Expenses: Hard  Food Insecurity: No Food Insecurity (05/23/2021)   Hunger Vital Sign    Worried About Running Out of Food in the Last Year: Never true    Ran Out of Food in the Last Year: Never true  Transportation Needs: No Transportation Needs (05/23/2021)   PRAPARE - Hydrologist (Medical): No    Lack of Transportation (Non-Medical): No  Physical Activity: Sufficiently Active (02/10/2020)   Exercise Vital Sign    Days of Exercise per Week: 3 days    Minutes of Exercise per Session: 60 min  Stress: No Stress Concern Present (05/23/2021)   [place]    Feeling of Stress : Not at all  Social Connections: Socially Isolated (02/10/2020)   Social Connection and Isolation Panel [NHANES]    Frequency of Communication with Friends and Family: Once a week    Frequency of Social Gatherings with Friends and Family: Once a week    Attends Religious Services: Never    Marine scientist or Organizations: Yes    Attends Archivist Meetings: Never    Marital Status: Widowed  Intimate Partner Violence: Not At Risk (05/23/2021)   Humiliation, Afraid, Rape, and Kick questionnaire    Fear of Current or Ex-Partner: No    Emotionally  Abused: No    Physically Abused: No    Sexually Abused: No    Review of Systems: See HPI, otherwise negative ROS  Physical Exam: There were no vitals taken for this visit. General:   Alert, cooperative in NAD Head:  Normocephalic and atraumatic. Respiratory:  Normal work of breathing. Cardiovascular:  RRR  Impression/Plan: Shane Jones is here for cataract surgery.  Risks, benefits, limitations, and alternatives regarding cataract surgery have been reviewed with the patient.  Questions have been answered.  All parties agreeable.   Norvel Richards, MD  02/16/2022, 10:52 AM

## 2022-02-16 NOTE — Op Note (Signed)
OPERATIVE NOTE  Shane Jones [id number] 02/16/2022   PREOPERATIVE DIAGNOSIS: Nuclear sclerotic cataract left eye. H25.12   POSTOPERATIVE DIAGNOSIS: Nuclear sclerotic cataract left eye. H25.12   PROCEDURE:  Phacoemusification with posterior chamber intraocular lens placement of the left eye  Ultrasound time: Procedure(s) with comments: CATARACT EXTRACTION PHACO AND INTRAOCULAR LENS PLACEMENT (IOC) LEFT DIABTIC  7.58  00:46.2 (Left) - Diabetic  LENS:   Implant Name Type Inv. Item Serial No. Manufacturer Lot No. LRB No. Used Action  LENS IOL TECNIS EYHANCE 23.0 - [id number] Intraocular Lens LENS IOL TECNIS EYHANCE 23.0 [id number] [employer]  Left 1 Implanted      SURGEON:  Courtney Heys. Lazarus Salines, MD   ANESTHESIA:  Topical with tetracaine drops, augmented with 1% preservative-free intracameral lidocaine.   COMPLICATIONS:  None.   DESCRIPTION OF PROCEDURE:  The patient was identified in the holding room and transported to the operating room and placed in the supine position under the operating microscope.  The left eye was identified as the operative eye, which was prepped and draped in the usual sterile ophthalmic fashion.   A 1 millimeter clear-corneal paracentesis was made inferotemporally. Preservative-free 1% lidocaine mixed with 1:1,000 bisulfite-free aqueous solution of epinephrine was injected into the anterior chamber. The anterior chamber was then filled with Viscoat viscoelastic. A 2.4 millimeter keratome was used to make a clear-corneal incision superotemporally. A curvilinear capsulorrhexis was made with a cystotome and capsulorrhexis forceps. Balanced salt solution was used to hydrodissect and hydrodelineate the nucleus. Phacoemulsification was then used to remove the lens nucleus and epinucleus. The remaining cortex was then removed using the irrigation and aspiration handpiece. Provisc was then placed into the capsular bag to distend it for lens placement. A +23.00 D DIB00 intraocular  lens was then injected into the capsular bag. The remaining viscoelastic was aspirated.   Wounds were hydrated with balanced salt solution.  The anterior chamber was inflated to a physiologic pressure with balanced salt solution.  No wound leaks were noted. Vigamox was injected intracamerally.  Timolol and Brimonidine drops were applied to the eye.  The patient was taken to the recovery room in stable condition without complications of anesthesia or surgery.  [name] [place] 02/16/2022, 11:52 AM

## 2022-02-16 NOTE — Anesthesia Postprocedure Evaluation (Addendum)
Anesthesia Post Note  [name]  Procedure(s) Performed: CATARACT EXTRACTION PHACO AND INTRAOCULAR LENS PLACEMENT (IOC) LEFT DIABTIC  7.58  00:46.2 (Left: Eye)  Patient location during evaluation: PACU Anesthesia Type: MAC Level of consciousness: awake and alert Pain management: pain level controlled Vital Signs Assessment: post-procedure vital signs reviewed and stable Respiratory status: spontaneous breathing, nonlabored ventilation and respiratory function stable Cardiovascular status: blood pressure returned to baseline and stable Postop Assessment: no apparent nausea or vomiting Anesthetic complications: no Comments: Heart monitor in pacu with one episode of bradycardia to the 30's for a short period of time. Pt denies symptoms of chest pain or SOB. The patient's rhythm was monitored and he was discharged after having no more episodes and no complaints.    No notable events documented.   Last Vitals:  Vitals:   02/16/22 1151 02/16/22 1200  BP: 111/89 123/77  Pulse: (!) 50 (!) 51  Resp: 13 12  Temp: (!) 36.3 C (!) 36.3 C  SpO2: 94% 96%    Last Pain:  Vitals:   02/16/22 1200  TempSrc:   PainSc: 0-No pain                 [place] Ouch

## 2022-02-16 NOTE — Transfer of Care (Signed)
Immediate Anesthesia Transfer of Care Note  [name]  Procedure(s) Performed: CATARACT EXTRACTION PHACO AND INTRAOCULAR LENS PLACEMENT (IOC) LEFT DIABTIC  7.58  00:46.2 (Left: Eye)  Patient Location: PACU  Anesthesia Type: MAC  Level of Consciousness: awake, alert  and patient cooperative  Airway and Oxygen Therapy: Patient Spontanous Breathing and Patient connected to supplemental oxygen  Post-op Assessment: Post-op Vital signs reviewed, Patient's Cardiovascular Status Stable, Respiratory Function Stable, Patent Airway and No signs of Nausea or vomiting  Post-op Vital Signs: Reviewed and stable  Complications: No notable events documented.

## 2022-02-17 ENCOUNTER — Encounter: Payer: Self-pay | Admitting: Ophthalmology

## 2022-02-20 LAB — GLUCOSE, CAPILLARY: Glucose-Capillary: 100 mg/dL — ABNORMAL HIGH (ref 70–99)

## 2022-03-03 ENCOUNTER — Telehealth: Payer: Self-pay | Admitting: Family Medicine

## 2022-03-03 NOTE — Telephone Encounter (Signed)
Copied from [place] [id number]. Topic: Medicare AWV >> Mar 03, 2022 11:43 AM Lollie Marrow wrote: Reason for CRM: Called patient to schedule Medicare Annual Wellness Visit (AWV). Left message for patient to call back and schedule Medicare Annual Wellness Visit (AWV).  Last date of AWV: 02/10/2020  Please schedule an appointment at any time with Denisa, [place].  If any questions, please contact me at [phone].    Thank you,  [place] Direct dial  [phone]

## 2022-03-24 ENCOUNTER — Inpatient Hospital Stay (HOSPITAL_COMMUNITY): Payer: PPO

## 2022-03-24 ENCOUNTER — Emergency Department (HOSPITAL_COMMUNITY): Payer: PPO

## 2022-03-24 ENCOUNTER — Encounter: Payer: Self-pay | Admitting: Family Medicine

## 2022-03-24 ENCOUNTER — Inpatient Hospital Stay (HOSPITAL_COMMUNITY): Payer: PPO | Admitting: Certified Registered Nurse Anesthetist

## 2022-03-24 ENCOUNTER — Inpatient Hospital Stay (HOSPITAL_COMMUNITY)
Admission: EM | Admit: 2022-03-24 | Discharge: 2022-04-01 | DRG: 064 | Disposition: A | Discharge status: Hospice | Payer: PPO | Attending: Neurology | Admitting: Neurology

## 2022-03-24 ENCOUNTER — Encounter (HOSPITAL_COMMUNITY)
Admission: EM | Disposition: A | Discharge status: Hospice | Payer: Self-pay | Source: Home / Self Care | Attending: Neurology

## 2022-03-24 ENCOUNTER — Ambulatory Visit (INDEPENDENT_AMBULATORY_CARE_PROVIDER_SITE_OTHER): Payer: PPO | Admitting: Family Medicine

## 2022-03-24 VITALS — BP 118/74 | HR 62 | Temp 97.4°F | Ht 61.0 in | Wt 166.0 lb

## 2022-03-24 DIAGNOSIS — G4730 Sleep apnea, unspecified: Secondary | ICD-10-CM | POA: Diagnosis present

## 2022-03-24 DIAGNOSIS — E440 Moderate protein-calorie malnutrition: Secondary | ICD-10-CM | POA: Diagnosis not present

## 2022-03-24 DIAGNOSIS — R4701 Aphasia: Secondary | ICD-10-CM | POA: Diagnosis not present

## 2022-03-24 DIAGNOSIS — G4733 Obstructive sleep apnea (adult) (pediatric): Secondary | ICD-10-CM | POA: Diagnosis not present

## 2022-03-24 DIAGNOSIS — Z1152 Encounter for screening for COVID-19: Secondary | ICD-10-CM | POA: Diagnosis not present

## 2022-03-24 DIAGNOSIS — D72829 Elevated white blood cell count, unspecified: Secondary | ICD-10-CM | POA: Diagnosis not present

## 2022-03-24 DIAGNOSIS — E870 Hyperosmolality and hypernatremia: Secondary | ICD-10-CM | POA: Diagnosis not present

## 2022-03-24 DIAGNOSIS — Z794 Long term (current) use of insulin: Secondary | ICD-10-CM | POA: Diagnosis not present

## 2022-03-24 DIAGNOSIS — E1165 Type 2 diabetes mellitus with hyperglycemia: Secondary | ICD-10-CM

## 2022-03-24 DIAGNOSIS — R471 Dysarthria and anarthria: Secondary | ICD-10-CM | POA: Diagnosis not present

## 2022-03-24 DIAGNOSIS — E871 Hypo-osmolality and hyponatremia: Secondary | ICD-10-CM | POA: Diagnosis not present

## 2022-03-24 DIAGNOSIS — Z4659 Encounter for fitting and adjustment of other gastrointestinal appliance and device: Secondary | ICD-10-CM | POA: Diagnosis not present

## 2022-03-24 DIAGNOSIS — I639 Cerebral infarction, unspecified: Secondary | ICD-10-CM | POA: Diagnosis not present

## 2022-03-24 DIAGNOSIS — Z7984 Long term (current) use of oral hypoglycemic drugs: Secondary | ICD-10-CM

## 2022-03-24 DIAGNOSIS — M109 Gout, unspecified: Secondary | ICD-10-CM | POA: Diagnosis present

## 2022-03-24 DIAGNOSIS — Z888 Allergy status to other drugs, medicaments and biological substances status: Secondary | ICD-10-CM

## 2022-03-24 DIAGNOSIS — J9690 Respiratory failure, unspecified, unspecified whether with hypoxia or hypercapnia: Secondary | ICD-10-CM | POA: Diagnosis not present

## 2022-03-24 DIAGNOSIS — R29726 NIHSS score 26: Secondary | ICD-10-CM | POA: Diagnosis present

## 2022-03-24 DIAGNOSIS — J9601 Acute respiratory failure with hypoxia: Secondary | ICD-10-CM | POA: Diagnosis not present

## 2022-03-24 DIAGNOSIS — M329 Systemic lupus erythematosus, unspecified: Secondary | ICD-10-CM | POA: Diagnosis present

## 2022-03-24 DIAGNOSIS — Z66 Do not resuscitate: Secondary | ICD-10-CM | POA: Diagnosis not present

## 2022-03-24 DIAGNOSIS — Z515 Encounter for palliative care: Secondary | ICD-10-CM | POA: Diagnosis not present

## 2022-03-24 DIAGNOSIS — I69351 Hemiplegia and hemiparesis following cerebral infarction affecting right dominant side: Secondary | ICD-10-CM | POA: Diagnosis not present

## 2022-03-24 DIAGNOSIS — I10 Essential (primary) hypertension: Secondary | ICD-10-CM | POA: Diagnosis not present

## 2022-03-24 DIAGNOSIS — R739 Hyperglycemia, unspecified: Secondary | ICD-10-CM | POA: Diagnosis not present

## 2022-03-24 DIAGNOSIS — E663 Overweight: Secondary | ICD-10-CM | POA: Diagnosis present

## 2022-03-24 DIAGNOSIS — Z91199 Patient's noncompliance with other medical treatment and regimen due to unspecified reason: Secondary | ICD-10-CM

## 2022-03-24 DIAGNOSIS — I63532 Cerebral infarction due to unspecified occlusion or stenosis of left posterior cerebral artery: Secondary | ICD-10-CM | POA: Diagnosis not present

## 2022-03-24 DIAGNOSIS — I6622 Occlusion and stenosis of left posterior cerebral artery: Secondary | ICD-10-CM | POA: Diagnosis present

## 2022-03-24 DIAGNOSIS — R414 Neurologic neglect syndrome: Secondary | ICD-10-CM | POA: Diagnosis present

## 2022-03-24 DIAGNOSIS — Z7985 Long-term (current) use of injectable non-insulin antidiabetic drugs: Secondary | ICD-10-CM

## 2022-03-24 DIAGNOSIS — Z87891 Personal history of nicotine dependence: Secondary | ICD-10-CM

## 2022-03-24 DIAGNOSIS — G8191 Hemiplegia, unspecified affecting right dominant side: Secondary | ICD-10-CM | POA: Diagnosis present

## 2022-03-24 DIAGNOSIS — Z4682 Encounter for fitting and adjustment of non-vascular catheter: Secondary | ICD-10-CM | POA: Diagnosis not present

## 2022-03-24 DIAGNOSIS — Z8719 Personal history of other diseases of the digestive system: Secondary | ICD-10-CM

## 2022-03-24 DIAGNOSIS — I129 Hypertensive chronic kidney disease with stage 1 through stage 4 chronic kidney disease, or unspecified chronic kidney disease: Secondary | ICD-10-CM | POA: Diagnosis not present

## 2022-03-24 DIAGNOSIS — N1832 Chronic kidney disease, stage 3b: Secondary | ICD-10-CM | POA: Diagnosis present

## 2022-03-24 DIAGNOSIS — Z538 Procedure and treatment not carried out for other reasons: Secondary | ICD-10-CM | POA: Diagnosis not present

## 2022-03-24 DIAGNOSIS — F05 Delirium due to known physiological condition: Secondary | ICD-10-CM | POA: Diagnosis not present

## 2022-03-24 DIAGNOSIS — E119 Type 2 diabetes mellitus without complications: Secondary | ICD-10-CM

## 2022-03-24 DIAGNOSIS — N179 Acute kidney failure, unspecified: Secondary | ICD-10-CM | POA: Diagnosis present

## 2022-03-24 DIAGNOSIS — R1310 Dysphagia, unspecified: Secondary | ICD-10-CM | POA: Diagnosis not present

## 2022-03-24 DIAGNOSIS — I63432 Cerebral infarction due to embolism of left posterior cerebral artery: Secondary | ICD-10-CM | POA: Diagnosis not present

## 2022-03-24 DIAGNOSIS — E1122 Type 2 diabetes mellitus with diabetic chronic kidney disease: Secondary | ICD-10-CM | POA: Diagnosis not present

## 2022-03-24 DIAGNOSIS — I959 Hypotension, unspecified: Secondary | ICD-10-CM | POA: Diagnosis not present

## 2022-03-24 DIAGNOSIS — Z7189 Other specified counseling: Secondary | ICD-10-CM | POA: Diagnosis not present

## 2022-03-24 DIAGNOSIS — J9811 Atelectasis: Secondary | ICD-10-CM | POA: Diagnosis not present

## 2022-03-24 DIAGNOSIS — Z7982 Long term (current) use of aspirin: Secondary | ICD-10-CM

## 2022-03-24 DIAGNOSIS — E876 Hypokalemia: Secondary | ICD-10-CM | POA: Diagnosis present

## 2022-03-24 DIAGNOSIS — R0689 Other abnormalities of breathing: Secondary | ICD-10-CM | POA: Diagnosis not present

## 2022-03-24 DIAGNOSIS — R509 Fever, unspecified: Secondary | ICD-10-CM | POA: Diagnosis not present

## 2022-03-24 DIAGNOSIS — R233 Spontaneous ecchymoses: Secondary | ICD-10-CM | POA: Diagnosis present

## 2022-03-24 DIAGNOSIS — G9341 Metabolic encephalopathy: Secondary | ICD-10-CM | POA: Diagnosis not present

## 2022-03-24 DIAGNOSIS — R0681 Apnea, not elsewhere classified: Secondary | ICD-10-CM | POA: Diagnosis not present

## 2022-03-24 DIAGNOSIS — R4182 Altered mental status, unspecified: Secondary | ICD-10-CM

## 2022-03-24 DIAGNOSIS — Z79899 Other long term (current) drug therapy: Secondary | ICD-10-CM

## 2022-03-24 DIAGNOSIS — Z9911 Dependence on respirator [ventilator] status: Secondary | ICD-10-CM | POA: Insufficient documentation

## 2022-03-24 DIAGNOSIS — R404 Transient alteration of awareness: Secondary | ICD-10-CM | POA: Diagnosis not present

## 2022-03-24 DIAGNOSIS — R1312 Dysphagia, oropharyngeal phase: Secondary | ICD-10-CM | POA: Diagnosis not present

## 2022-03-24 DIAGNOSIS — Z781 Physical restraint status: Secondary | ICD-10-CM

## 2022-03-24 DIAGNOSIS — I6389 Other cerebral infarction: Secondary | ICD-10-CM | POA: Diagnosis not present

## 2022-03-24 DIAGNOSIS — J189 Pneumonia, unspecified organism: Secondary | ICD-10-CM | POA: Diagnosis not present

## 2022-03-24 DIAGNOSIS — Z7401 Bed confinement status: Secondary | ICD-10-CM | POA: Diagnosis not present

## 2022-03-24 DIAGNOSIS — E785 Hyperlipidemia, unspecified: Secondary | ICD-10-CM | POA: Diagnosis present

## 2022-03-24 DIAGNOSIS — R29818 Other symptoms and signs involving the nervous system: Secondary | ICD-10-CM | POA: Diagnosis not present

## 2022-03-24 DIAGNOSIS — I9581 Postprocedural hypotension: Secondary | ICD-10-CM | POA: Diagnosis present

## 2022-03-24 DIAGNOSIS — Z6825 Body mass index (BMI) 25.0-25.9, adult: Secondary | ICD-10-CM

## 2022-03-24 HISTORY — PX: RADIOLOGY WITH ANESTHESIA: SHX6223

## 2022-03-24 HISTORY — PX: IR PERCUTANEOUS ART THROMBECTOMY/INFUSION INTRACRANIAL INC DIAG ANGIO: IMG6087

## 2022-03-24 LAB — CBC
HCT: 41.6 % (ref 39.0–52.0)
Hemoglobin: 14.9 g/dL (ref 13.0–17.0)
MCH: 32 pg (ref 26.0–34.0)
MCHC: 35.8 g/dL (ref 30.0–36.0)
MCV: 89.5 fL (ref 80.0–100.0)
Platelets: 184 10*3/uL (ref 150–400)
RBC: 4.65 MIL/uL (ref 4.22–5.81)
RDW: 12.3 % (ref 11.5–15.5)
WBC: 8.3 10*3/uL (ref 4.0–10.5)
nRBC: 0 % (ref 0.0–0.2)

## 2022-03-24 LAB — DIFFERENTIAL
Abs Immature Granulocytes: 0.03 10*3/uL (ref 0.00–0.07)
Basophils Absolute: 0 10*3/uL (ref 0.0–0.1)
Basophils Relative: 1 %
Eosinophils Absolute: 0.1 10*3/uL (ref 0.0–0.5)
Eosinophils Relative: 2 %
Immature Granulocytes: 0 %
Lymphocytes Relative: 43 %
Lymphs Abs: 3.6 10*3/uL (ref 0.7–4.0)
Monocytes Absolute: 0.6 10*3/uL (ref 0.1–1.0)
Monocytes Relative: 7 %
Neutro Abs: 4 10*3/uL (ref 1.7–7.7)
Neutrophils Relative %: 47 %

## 2022-03-24 LAB — BASIC METABOLIC PANEL
Anion gap: 9 (ref 5–15)
BUN: 25 mg/dL — ABNORMAL HIGH (ref 8–23)
CO2: 22 mmol/L (ref 22–32)
Calcium: 8.5 mg/dL — ABNORMAL LOW (ref 8.9–10.3)
Chloride: 104 mmol/L (ref 98–111)
Creatinine, Ser: 1.55 mg/dL — ABNORMAL HIGH (ref 0.61–1.24)
GFR, Estimated: 45 mL/min — ABNORMAL LOW (ref >60)
Glucose, Bld: 232 mg/dL — ABNORMAL HIGH (ref 70–99)
Potassium: 3.4 mmol/L — ABNORMAL LOW (ref 3.5–5.1)
Sodium: 135 mmol/L (ref 135–145)

## 2022-03-24 LAB — LACTIC ACID, PLASMA
Lactic Acid, Venous: 1.6 mmol/L (ref 0.5–1.9)
Lactic Acid, Venous: 1.6 mmol/L (ref 0.5–1.9)

## 2022-03-24 LAB — POCT GLYCOSYLATED HEMOGLOBIN (HGB A1C): Hemoglobin A1C: 11.1 % — AB (ref 4.0–5.6)

## 2022-03-24 LAB — COMPREHENSIVE METABOLIC PANEL
ALT: 14 U/L (ref 0–44)
AST: 23 U/L (ref 15–41)
Albumin: 3.4 g/dL — ABNORMAL LOW (ref 3.5–5.0)
Alkaline Phosphatase: 64 U/L (ref 38–126)
Anion gap: 9 (ref 5–15)
BUN: 29 mg/dL — ABNORMAL HIGH (ref 8–23)
CO2: 25 mmol/L (ref 22–32)
Calcium: 8.6 mg/dL — ABNORMAL LOW (ref 8.9–10.3)
Chloride: 101 mmol/L (ref 98–111)
Creatinine, Ser: 1.97 mg/dL — ABNORMAL HIGH (ref 0.61–1.24)
GFR, Estimated: 34 mL/min — ABNORMAL LOW (ref >60)
Glucose, Bld: 291 mg/dL — ABNORMAL HIGH (ref 70–99)
Potassium: 2.7 mmol/L — CL (ref 3.5–5.1)
Sodium: 135 mmol/L (ref 135–145)
Total Bilirubin: 0.6 mg/dL (ref 0.3–1.2)
Total Protein: 6 g/dL — ABNORMAL LOW (ref 6.5–8.1)

## 2022-03-24 LAB — POCT I-STAT 7, (LYTES, BLD GAS, ICA,H+H)
Acid-base deficit: 2 mmol/L (ref 0.0–2.0)
Bicarbonate: 22.7 mmol/L (ref 20.0–28.0)
Calcium, Ion: 1.2 mmol/L (ref 1.15–1.40)
HCT: 37 % — ABNORMAL LOW (ref 39.0–52.0)
Hemoglobin: 12.6 g/dL — ABNORMAL LOW (ref 13.0–17.0)
O2 Saturation: 100 %
Patient temperature: 92.7
Potassium: 3.3 mmol/L — ABNORMAL LOW (ref 3.5–5.1)
Sodium: 139 mmol/L (ref 135–145)
TCO2: 24 mmol/L (ref 22–32)
pCO2 arterial: 32.6 mmHg (ref 32–48)
pH, Arterial: 7.436 (ref 7.35–7.45)
pO2, Arterial: 189 mmHg — ABNORMAL HIGH (ref 83–108)

## 2022-03-24 LAB — RESP PANEL BY RT-PCR (RSV, FLU A&B, COVID)  RVPGX2
Influenza A by PCR: NEGATIVE
Influenza B by PCR: NEGATIVE
Resp Syncytial Virus by PCR: NEGATIVE
SARS Coronavirus 2 by RT PCR: NEGATIVE

## 2022-03-24 LAB — APTT: aPTT: 24 seconds (ref 24–36)

## 2022-03-24 LAB — MRSA NEXT GEN BY PCR, NASAL: MRSA by PCR Next Gen: NOT DETECTED

## 2022-03-24 LAB — I-STAT CHEM 8, ED
BUN: 31 mg/dL — ABNORMAL HIGH (ref 8–23)
Calcium, Ion: 1.2 mmol/L (ref 1.15–1.40)
Chloride: 99 mmol/L (ref 98–111)
Creatinine, Ser: 1.9 mg/dL — ABNORMAL HIGH (ref 0.61–1.24)
Glucose, Bld: 292 mg/dL — ABNORMAL HIGH (ref 70–99)
HCT: 42 % (ref 39.0–52.0)
Hemoglobin: 14.3 g/dL (ref 13.0–17.0)
Potassium: 2.8 mmol/L — ABNORMAL LOW (ref 3.5–5.1)
Sodium: 139 mmol/L (ref 135–145)
TCO2: 25 mmol/L (ref 22–32)

## 2022-03-24 LAB — PROTIME-INR
INR: 1 (ref 0.8–1.2)
Prothrombin Time: 13.2 seconds (ref 11.4–15.2)

## 2022-03-24 LAB — PROCALCITONIN: Procalcitonin: <0.1 ng/mL

## 2022-03-24 LAB — GLUCOSE, CAPILLARY
Glucose-Capillary: 201 mg/dL — ABNORMAL HIGH (ref 70–99)
Glucose-Capillary: 231 mg/dL — ABNORMAL HIGH (ref 70–99)
Glucose-Capillary: 216 mg/dL — ABNORMAL HIGH (ref 70–99)
Glucose-Capillary: 268 mg/dL — ABNORMAL HIGH (ref 70–99)

## 2022-03-24 LAB — ETHANOL: Alcohol, Ethyl (B): <10 mg/dL (ref <10)

## 2022-03-24 LAB — GLUCOSE, POCT (MANUAL RESULT ENTRY): POC Glucose: 326 mg/dl — AB (ref 70–99)

## 2022-03-24 LAB — CBG MONITORING, ED: Glucose-Capillary: 266 mg/dL — ABNORMAL HIGH (ref 70–99)

## 2022-03-24 SURGERY — IR WITH ANESTHESIA
Anesthesia: General

## 2022-03-24 MED ORDER — ORAL CARE MOUTH RINSE: 15.0000 mL | OROMUCOSAL | Status: DC

## 2022-03-24 MED ORDER — IOHEXOL 350 MG/ML SOLN
75.0000 mL | Freq: Once | INTRAVENOUS | Status: AC | PRN
  Administered 2022-03-24: 75 mL via INTRAVENOUS

## 2022-03-24 MED ORDER — INSULIN ASPART 100 UNIT/ML IJ SOLN: 0.0000 [IU] | Freq: Three times a day (TID) | INTRAMUSCULAR | Status: DC

## 2022-03-24 MED ORDER — ASPIRIN 81 MG PO CHEW
81.0000 mg | CHEWABLE_TABLET | Freq: Every day | ORAL | Status: DC
  Administered 2022-03-24 – 2022-03-25 (×2): 81 mg
  Filled 2022-03-24 (×2): qty 1

## 2022-03-24 MED ORDER — PANTOPRAZOLE SODIUM 40 MG IV SOLR
40.0000 mg | Freq: Every day | INTRAVENOUS | Status: DC
  Administered 2022-03-25 – 2022-04-01 (×8): 40 mg via INTRAVENOUS
  Filled 2022-03-24 (×8): qty 10

## 2022-03-24 MED ORDER — SODIUM CHLORIDE 0.9 % IV SOLN: 250.0000 mL | INTRAVENOUS | Status: DC

## 2022-03-24 MED ORDER — ATROPINE SULFATE 1 MG/10ML IJ SOSY
PREFILLED_SYRINGE | INTRAMUSCULAR | Status: AC
  Filled 2022-03-24: qty 10

## 2022-03-24 MED ORDER — SODIUM CHLORIDE 0.9 % IV SOLN
250.0000 mL | INTRAVENOUS | Status: DC
  Administered 2022-03-24: 250 mL via INTRAVENOUS

## 2022-03-24 MED ORDER — INSULIN ASPART 100 UNIT/ML IJ SOLN
0.0000 [IU] | INTRAMUSCULAR | Status: DC
  Administered 2022-03-24: 5 [IU] via SUBCUTANEOUS
  Administered 2022-03-25 (×4): 3 [IU] via SUBCUTANEOUS
  Administered 2022-03-25: 5 [IU] via SUBCUTANEOUS
  Administered 2022-03-25: 8 [IU] via SUBCUTANEOUS
  Administered 2022-03-26 (×3): 3 [IU] via SUBCUTANEOUS
  Administered 2022-03-26: 2 [IU] via SUBCUTANEOUS
  Administered 2022-03-26: 3 [IU] via SUBCUTANEOUS
  Administered 2022-03-27: 8 [IU] via SUBCUTANEOUS
  Administered 2022-03-27: 5 [IU] via SUBCUTANEOUS
  Administered 2022-03-27 (×3): 3 [IU] via SUBCUTANEOUS
  Administered 2022-03-28 (×2): 15 [IU] via SUBCUTANEOUS
  Administered 2022-03-28: 3 [IU] via SUBCUTANEOUS
  Administered 2022-03-28: 15 [IU] via SUBCUTANEOUS
  Administered 2022-03-28: 8 [IU] via SUBCUTANEOUS
  Administered 2022-03-29 (×3): 5 [IU] via SUBCUTANEOUS
  Administered 2022-03-29 – 2022-03-30 (×4): 8 [IU] via SUBCUTANEOUS
  Administered 2022-03-30: 15 [IU] via SUBCUTANEOUS
  Administered 2022-03-30: 8 [IU] via SUBCUTANEOUS
  Administered 2022-03-30: 5 [IU] via SUBCUTANEOUS
  Administered 2022-03-30: 15 [IU] via SUBCUTANEOUS
  Administered 2022-03-31: 8 [IU] via SUBCUTANEOUS
  Administered 2022-03-31 (×2): 5 [IU] via SUBCUTANEOUS
  Administered 2022-03-31: 3 [IU] via SUBCUTANEOUS
  Administered 2022-03-31: 11 [IU] via SUBCUTANEOUS
  Administered 2022-03-31: 5 [IU] via SUBCUTANEOUS
  Administered 2022-04-01: 11 [IU] via SUBCUTANEOUS
  Administered 2022-04-01 (×2): 8 [IU] via SUBCUTANEOUS

## 2022-03-24 MED ORDER — FENTANYL CITRATE PF 50 MCG/ML IJ SOSY
25.0000 ug | PREFILLED_SYRINGE | INTRAMUSCULAR | Status: DC | PRN
  Administered 2022-03-24: 50 ug via INTRAVENOUS
  Administered 2022-03-25 (×2): 100 ug via INTRAVENOUS
  Administered 2022-03-25: 50 ug via INTRAVENOUS
  Administered 2022-03-25 – 2022-03-26 (×2): 100 ug via INTRAVENOUS
  Administered 2022-03-26: 50 ug via INTRAVENOUS
  Administered 2022-03-26: 100 ug via INTRAVENOUS
  Administered 2022-03-26: 50 ug via INTRAVENOUS
  Administered 2022-03-26 (×4): 100 ug via INTRAVENOUS
  Filled 2022-03-24 (×2): qty 2
  Filled 2022-03-24: qty 1
  Filled 2022-03-24 (×4): qty 2
  Filled 2022-03-24: qty 1
  Filled 2022-03-24: qty 2
  Filled 2022-03-24: qty 1
  Filled 2022-03-24 (×2): qty 2
  Filled 2022-03-24: qty 1

## 2022-03-24 MED ORDER — NOREPINEPHRINE 4 MG/250ML-% IV SOLN
2.0000 ug/min | INTRAVENOUS | Status: DC
  Administered 2022-03-24: 12 ug/min via INTRAVENOUS
  Administered 2022-03-25: 15 ug/min via INTRAVENOUS
  Filled 2022-03-24 (×3): qty 250

## 2022-03-24 MED ORDER — CLOPIDOGREL BISULFATE 75 MG PO TABS
75.0000 mg | ORAL_TABLET | Freq: Every day | ORAL | Status: DC
  Administered 2022-03-24 – 2022-04-01 (×8): 75 mg
  Filled 2022-03-24 (×8): qty 1

## 2022-03-24 MED ORDER — ACETAMINOPHEN 650 MG RE SUPP: 650.0000 mg | RECTAL | Status: DC | PRN

## 2022-03-24 MED ORDER — POTASSIUM CHLORIDE 20 MEQ PO PACK
80.0000 meq | PACK | Freq: Once | ORAL | Status: AC
  Administered 2022-03-24: 80 meq
  Filled 2022-03-24: qty 4

## 2022-03-24 MED ORDER — CEFAZOLIN SODIUM-DEXTROSE 2-4 GM/100ML-% IV SOLN
INTRAVENOUS | Status: AC
  Filled 2022-03-24: qty 100

## 2022-03-24 MED ORDER — IOHEXOL 300 MG/ML  SOLN
50.0000 mL | Freq: Once | INTRAMUSCULAR | Status: AC | PRN
  Administered 2022-03-24: 25 mL via INTRA_ARTERIAL

## 2022-03-24 MED ORDER — SODIUM CHLORIDE 0.9 % IV SOLN: INTRAVENOUS | Status: DC

## 2022-03-24 MED ORDER — ONDANSETRON HCL 4 MG/2ML IJ SOLN: 4.0000 mg | Freq: Once | INTRAMUSCULAR | Status: DC

## 2022-03-24 MED ORDER — EZETIMIBE 10 MG PO TABS
10.0000 mg | ORAL_TABLET | Freq: Every day | ORAL | Status: DC
  Administered 2022-03-24 – 2022-03-25 (×2): 10 mg via ORAL
  Filled 2022-03-24 (×2): qty 1

## 2022-03-24 MED ORDER — LACTATED RINGERS IV BOLUS: 500.0000 mL | Freq: Once | INTRAVENOUS | Status: AC

## 2022-03-24 MED ORDER — SENNOSIDES-DOCUSATE SODIUM 8.6-50 MG PO TABS: 1.0000 | ORAL_TABLET | Freq: Every evening | ORAL | Status: DC | PRN

## 2022-03-24 MED ORDER — ACETAMINOPHEN 325 MG PO TABS: 650.0000 mg | ORAL_TABLET | ORAL | Status: DC | PRN

## 2022-03-24 MED ORDER — POLYETHYLENE GLYCOL 3350 17 G PO PACK
17.0000 g | PACK | Freq: Every day | ORAL | Status: DC
  Administered 2022-03-25 – 2022-03-29 (×3): 17 g
  Filled 2022-03-24 (×3): qty 1

## 2022-03-24 MED ORDER — ORAL CARE MOUTH RINSE
15.0000 mL | OROMUCOSAL | Status: DC
  Administered 2022-03-24 – 2022-03-25 (×8): 15 mL via OROMUCOSAL

## 2022-03-24 MED ORDER — FAMOTIDINE 20 MG PO TABS: 20.0000 mg | ORAL_TABLET | Freq: Two times a day (BID) | ORAL | Status: DC

## 2022-03-24 MED ORDER — STROKE: EARLY STAGES OF RECOVERY BOOK
Freq: Once | Status: AC
  Filled 2022-03-24: qty 1

## 2022-03-24 MED ORDER — FENTANYL CITRATE PF 50 MCG/ML IJ SOSY: 25.0000 ug | PREFILLED_SYRINGE | INTRAMUSCULAR | Status: DC | PRN

## 2022-03-24 MED ORDER — NOREPINEPHRINE 4 MG/250ML-% IV SOLN
INTRAVENOUS | Status: AC
  Filled 2022-03-24: qty 250

## 2022-03-24 MED ORDER — ORAL CARE MOUTH RINSE: 15.0000 mL | OROMUCOSAL | Status: DC | PRN

## 2022-03-24 MED ORDER — ROCURONIUM BROMIDE 10 MG/ML (PF) SYRINGE
PREFILLED_SYRINGE | INTRAVENOUS | Status: DC | PRN
  Administered 2022-03-24 (×2): 50 mg via INTRAVENOUS

## 2022-03-24 MED ORDER — ATORVASTATIN CALCIUM 80 MG PO TABS
80.0000 mg | ORAL_TABLET | Freq: Every day | ORAL | Status: DC
  Administered 2022-03-24 – 2022-04-01 (×8): 80 mg
  Filled 2022-03-24 (×8): qty 1

## 2022-03-24 MED ORDER — EPHEDRINE SULFATE-NACL 50-0.9 MG/10ML-% IV SOSY
PREFILLED_SYRINGE | INTRAVENOUS | Status: DC | PRN
  Administered 2022-03-24: 10 mg via INTRAMUSCULAR
  Administered 2022-03-24: 15 mg via INTRAMUSCULAR

## 2022-03-24 MED ORDER — ACETAMINOPHEN 160 MG/5ML PO SOLN: 650.0000 mg | ORAL | Status: DC | PRN

## 2022-03-24 MED ORDER — PHENYLEPHRINE 80 MCG/ML (10ML) SYRINGE FOR IV PUSH (FOR BLOOD PRESSURE SUPPORT)
PREFILLED_SYRINGE | INTRAVENOUS | Status: DC | PRN
  Administered 2022-03-24 (×2): 120 ug via INTRAVENOUS
  Administered 2022-03-24: 80 ug via INTRAVENOUS
  Administered 2022-03-24: 120 ug via INTRAVENOUS
  Administered 2022-03-24: 80 ug via INTRAVENOUS

## 2022-03-24 MED ORDER — HEPARIN SODIUM (PORCINE) 5000 UNIT/ML IJ SOLN
5000.0000 [IU] | Freq: Three times a day (TID) | INTRAMUSCULAR | Status: DC
  Administered 2022-03-24 – 2022-04-01 (×23): 5000 [IU] via SUBCUTANEOUS
  Filled 2022-03-24 (×23): qty 1

## 2022-03-24 MED ORDER — SODIUM CHLORIDE 0.9 % IV SOLN
3.0000 g | Freq: Three times a day (TID) | INTRAVENOUS | Status: DC
  Administered 2022-03-24 – 2022-03-26 (×6): 3 g via INTRAVENOUS
  Filled 2022-03-24 (×6): qty 8

## 2022-03-24 MED ORDER — LACTATED RINGERS IV BOLUS
500.0000 mL | Freq: Once | INTRAVENOUS | Status: AC
  Administered 2022-03-24: 500 mL via INTRAVENOUS

## 2022-03-24 MED ORDER — CLEVIDIPINE BUTYRATE 0.5 MG/ML IV EMUL: 0.0000 mg/h | INTRAVENOUS | Status: DC

## 2022-03-24 MED ORDER — FENTANYL 2500MCG IN NS 250ML (10MCG/ML) PREMIX INFUSION
0.0000 ug/h | INTRAVENOUS | Status: DC
  Administered 2022-03-24: 25 ug/h via INTRAVENOUS
  Filled 2022-03-24: qty 250

## 2022-03-24 MED ORDER — MIDAZOLAM HCL 2 MG/2ML IJ SOLN
1.0000 mg | INTRAMUSCULAR | Status: DC | PRN
  Administered 2022-03-24 (×2): 1 mg via INTRAVENOUS
  Filled 2022-03-24 (×2): qty 2

## 2022-03-24 MED ORDER — NITROGLYCERIN 1 MG/10 ML FOR IR/CATH LAB
INTRA_ARTERIAL | Status: AC
  Filled 2022-03-24: qty 10

## 2022-03-24 MED ORDER — FAMOTIDINE 20 MG PO TABS
20.0000 mg | ORAL_TABLET | Freq: Every day | ORAL | Status: DC
  Administered 2022-03-24 – 2022-03-25 (×2): 20 mg
  Filled 2022-03-24 (×2): qty 1

## 2022-03-24 MED ORDER — PHENYLEPHRINE HCL (PRESSORS) 10 MG/ML IV SOLN
INTRAVENOUS | Status: DC | PRN
  Administered 2022-03-24: 160 ug via INTRAVENOUS

## 2022-03-24 MED ORDER — PHENYLEPHRINE HCL-NACL 20-0.9 MG/250ML-% IV SOLN: 25.0000 ug/min | INTRAVENOUS | Status: DC

## 2022-03-24 MED ORDER — ACETAMINOPHEN 160 MG/5ML PO SOLN
650.0000 mg | ORAL | Status: DC | PRN
  Administered 2022-03-30 – 2022-04-01 (×3): 650 mg
  Filled 2022-03-24 (×3): qty 20.3

## 2022-03-24 MED ORDER — SUCCINYLCHOLINE CHLORIDE 200 MG/10ML IV SOSY
PREFILLED_SYRINGE | INTRAVENOUS | Status: DC | PRN
  Administered 2022-03-24: 120 mg via INTRAVENOUS

## 2022-03-24 MED ORDER — PROPOFOL 1000 MG/100ML IV EMUL: 0.0000 ug/kg/min | INTRAVENOUS | Status: DC

## 2022-03-24 MED ORDER — LIDOCAINE 2% (20 MG/ML) 5 ML SYRINGE
INTRAMUSCULAR | Status: DC | PRN
  Administered 2022-03-24: 60 mg via INTRAVENOUS

## 2022-03-24 MED ORDER — SODIUM CHLORIDE 0.9% FLUSH: 3.0000 mL | Freq: Once | INTRAVENOUS | Status: DC

## 2022-03-24 MED ORDER — IOHEXOL 300 MG/ML  SOLN
150.0000 mL | Freq: Once | INTRAMUSCULAR | Status: AC | PRN
  Administered 2022-03-24: 75 mL via INTRA_ARTERIAL

## 2022-03-24 MED ORDER — EPHEDRINE SULFATE-NACL 50-0.9 MG/10ML-% IV SOSY
PREFILLED_SYRINGE | INTRAVENOUS | Status: DC | PRN
  Administered 2022-03-24 (×3): 5 mg via INTRAVENOUS
  Administered 2022-03-24: 10 mg via INTRAVENOUS

## 2022-03-24 MED ORDER — DOCUSATE SODIUM 50 MG/5ML PO LIQD
100.0000 mg | Freq: Two times a day (BID) | ORAL | Status: DC
  Administered 2022-03-24 – 2022-03-29 (×8): 100 mg
  Filled 2022-03-24 (×8): qty 10

## 2022-03-24 MED ORDER — ONDANSETRON HCL 4 MG/2ML IJ SOLN
INTRAMUSCULAR | Status: DC | PRN
  Administered 2022-03-24: 4 mg via INTRAVENOUS

## 2022-03-24 MED ORDER — PHENYLEPHRINE HCL-NACL 20-0.9 MG/250ML-% IV SOLN
INTRAVENOUS | Status: DC | PRN
  Administered 2022-03-24: 100 ug/min via INTRAVENOUS

## 2022-03-24 MED ORDER — PROPOFOL 10 MG/ML IV BOLUS
INTRAVENOUS | Status: DC | PRN
  Administered 2022-03-24: 20 mg via INTRAVENOUS
  Administered 2022-03-24: 130 mg via INTRAVENOUS

## 2022-03-24 NOTE — Sedation Documentation (Addendum)
No pass and no intervention completed during IR.

## 2022-03-24 NOTE — Anesthesia Postprocedure Evaluation (Signed)
Anesthesia Post Note  [name]  Procedure(s) Performed: IR WITH ANESTHESIA     Patient location during evaluation: ICU Anesthesia Type: General Level of consciousness: patient remains intubated per anesthesia plan Pain management: pain level controlled Vital Signs Assessment: post-procedure vital signs reviewed and stable Respiratory status: patient on ventilator - see flowsheet for VS and patient remains intubated per anesthesia plan Cardiovascular status: blood pressure returned to baseline and stable Postop Assessment: no apparent nausea or vomiting Anesthetic complications: no   No notable events documented.  Last Vitals:  Vitals:   03/24/22 1508  BP: 130/80  Pulse: 65  Resp: 18    Last Pain: There were no vitals filed for this visit.               Pervis Hocking

## 2022-03-24 NOTE — Transfer of Care (Addendum)
Immediate Anesthesia Transfer of Care Note  [name]  Procedure(s) Performed: IR WITH ANESTHESIA  Patient Location: ICU  Anesthesia Type:General  Level of Consciousness: Patient remains intubated per anesthesia plan  Airway & Oxygen Therapy: Patient placed on Ventilator (see vital sign flow sheet for setting)  Post-op Assessment: Report given to RN and Post -op Vital signs reviewed and stable  Post vital signs: Reviewed and stable  Last Vitals:  Vitals Value Taken Time  BP 95/63 03/24/22 1515  Temp 98   Pulse 68 03/24/22 1517  Resp 18 03/24/22 1517  SpO2 100 % 03/24/22 1517  Vitals shown include unvalidated device data.  Last Pain: There were no vitals filed for this visit.       Complications: No notable events documented.

## 2022-03-24 NOTE — Progress Notes (Signed)
Dear Doctors,  This patient has been identified as a candidate for PICC for the following reason (s): IV therapy over 48 hours, drug pH or osmolality (causing phlebitis, infiltration in 24 hours), and drug extravasation potential with tissue necrosis (KCL, Dilantin, Dopamine, CaCl, MgSO4, chemo vesicant) If you agree, please write an order for the indicated device. For any questions contact the Vascular Access Team at [phone] if no answer, please leave a message.  Thank you for supporting the early vascular access assessment program.

## 2022-03-24 NOTE — Code Documentation (Signed)
Stroke Response Nurse Documentation Code Documentation  Shane Jones is a 77 y.o. male arriving to [facility]  via [facility] on 03/24/22 with past medical hx of uncontrolled diabetes, sleep apnea, hypertension . On No antithrombotic. Code stroke was activated by EMS.   Patient from the doctor's office where he was LKW at 1120 and now complaining of syncopal episode, aphasia, right sided paralysis. After speaking with pt's son it sounds like he was "not acting himself" for the past 3-4 days and that is why he was taken to the doctor this am.    Stroke team at the bedside on patient arrival. Labs drawn and patient cleared for CT by Dr. Ronnald Nian. Patient to CT with team. PT vomited twice in CT and was given PRN Zofran. NIHSS 24, see documentation for details and code stroke times. Patient with disoriented, not following commands, left gaze preference , right hemianopia, right facial droop, right arm weakness, right leg weakness, right decreased sensation, Global aphasia , and dysarthria  on exam. The following imaging was completed:  CT Head, CTA, and CTP. Patient is not a candidate for IV Thrombolytic due to outside the window. Patient is a candidate for IR due to left P1 occlusion on scan per MD. Code IR was activated by Neuro NP. Patient was cleaned, taken to IR suite and placed on the table. Pulses were marked and groin site prepped. Covid swab completed and sent.    Care Plan: IR for thrombectomy, admission to ICU (preferably 4N).   Bedside handoff with IR RN Miquel Dunn.    Nissim Fleischer, Rande Brunt  Stroke Response RN

## 2022-03-24 NOTE — Sedation Documentation (Signed)
Critical Potassium level of 2.7 from Lab. Dr. Estanislado Pandy and CRNA notified.

## 2022-03-24 NOTE — Assessment & Plan Note (Signed)
Chronic issue.  Uncontrolled based on A1c.  We will deal with this issue when the patient is able to follow-up moving forward.

## 2022-03-24 NOTE — TOC Progression Note (Signed)
Transition of Care [facility]) - Initial/Assessment Note    Patient Details  Name: Shane Jones MRN: [id number] Date of Birth: 07/27/1945  Transition of Care [facility]) CM/SW Contact:    Milinda Antis, LCSWA Phone Number: 03/24/2022, 3:47 PM  Clinical Narrative:                 Transition of Care Department [facility]) has reviewed patient.  Patient is currently intubated. We will continue to monitor patient advancement through interdisciplinary progression rounds.     Patient Goals and CMS Choice            Expected Discharge Plan and Services                                              Prior Living Arrangements/Services                       Activities of Daily Living      Permission Sought/Granted                  Emotional Assessment              Admission diagnosis:  Acute ischemic stroke [facility]) [I63.9] Cerebrovascular accident (CVA), unspecified mechanism ([place]) [I63.9] Patient Active Problem List   Diagnosis Date Noted   Altered mental status 03/24/2022   Acute ischemic stroke ([place]) 03/24/2022   Cataracts, bilateral 07/25/2021   Allergic rhinitis 04/12/2021   Grief 02/02/2021   Urinary urgency 11/15/2020   History of hypokalemia 11/15/2020   Erectile dysfunction 06/29/2020   Tick bite of foot 06/29/2020   Headache 05/13/2020   Overweight 05/30/2019   Benign hypertensive kidney disease with chronic kidney disease 01/22/2019   Secondary hyperparathyroidism of renal origin ([place]) 01/22/2019   Stage 3a chronic kidney disease ([place]) 01/22/2019   Lightheadedness 12/19/2018   Skin lesions 01/21/2018   Esophagitis    Proteinuria 10/10/2017   Onychomycosis 03/19/2017   Anxiety and depression 09/19/2016   Hypokalemia 09/19/2016   Loss of height 05/01/2016   Left shoulder pain 01/31/2016   Sleep apnea 11/22/2011   Chronic back pain 08/17/2011   Hyperlipidemia 08/17/2011   Gout 08/17/2011   T2DM (type 2 diabetes mellitus) ([place]) 02/20/2011    Neuropathy ([place]) 02/17/2011   Hypertension 10/13/2010   PCP:  Leone Haven, MD Pharmacy:   [facility], [address] [place] [address] Phone: [phone] Fax: [place] [address] Phone: [phone] Fax: [phone]     Social Determinants of Health (SDOH) Social History: SDOH Screenings   Food Insecurity: No Food Insecurity (05/23/2021)  Housing: Medium Risk (05/23/2021)  Transportation Needs: No Transportation Needs (05/23/2021)  Depression (PHQ2-9): Low Risk  (03/24/2022)  Financial Resource Strain: High Risk (12/22/2021)  Physical Activity: Sufficiently Active (02/10/2020)  Social Connections: Socially Isolated (02/10/2020)  Stress: No Stress Concern Present (05/23/2021)  Tobacco Use: Medium Risk (03/24/2022)   SDOH Interventions:     Readmission Risk Interventions     No data to display

## 2022-03-24 NOTE — Anesthesia Procedure Notes (Addendum)
Arterial Line Insertion Start/End3/22/2024 1:32 PM, 03/24/2022 1:32 PM Performed by: Pervis Hocking, DO, anesthesiologist  Patient location: Pre-op. Preanesthetic checklist: patient identified, IV checked, site marked, risks and benefits discussed, surgical consent, monitors and equipment checked, pre-op evaluation, timeout performed and anesthesia consent Lidocaine 1% used for infiltration Left, radial was placed Catheter size: 20 G Hand hygiene performed  and maximum sterile barriers used   Attempts: 1 Procedure performed without using ultrasound guided technique. Following insertion, dressing applied. Post procedure assessment: normal and unchanged  Post procedure complications: unsuccessful attempts and second provider assisted. Patient tolerated the procedure well with no immediate complications.

## 2022-03-24 NOTE — ED Provider Notes (Signed)
[facility] Provider Note   CSN: [id number] Arrival date & time: 03/24/22  1221     History  No chief complaint on file.   Shane Jones is a 77 y.o. male.  Patient arrives as a code stroke, right-sided weakness, left gaze, last known normal at 1130.  He was at [place] office and had sudden right-sided weakness, dysarthria, vomiting episodes.  Left-sided gaze.  He has had normal vitals.  He is unable to really give history.  Level 5 caveat.  Seems like he has not been doing well the last few days per family.  This was a general follow-up with the primary care doctor's office.  Patient has history of hypertension, diabetes.  The history is provided by the patient and the EMS personnel.       Home Medications Prior to Admission medications   Medication Sig Start Date End Date Taking? Authorizing Provider  allopurinol (ZYLOPRIM) 100 MG tablet Take 1 tablet (100 mg total) by mouth daily. 01/27/22   Leone Haven, MD  amLODipine (NORVASC) 10 MG tablet Take 1 tablet (10 mg total) by mouth daily. 01/27/22   Leone Haven, MD  aspirin EC 81 MG tablet Take 81 mg by mouth daily. Swallow whole.    [provider]  atorvastatin (LIPITOR) 80 MG tablet Take 1 tablet (80 mg total) by mouth daily. 01/27/22   Leone Haven, MD  blood glucose meter kit and supplies Dispense based on patient and insurance preference. Use up to four times daily as directed. (FOR ICD-10 E10.9, E11.9). 03/11/18   Leone Haven, MD  Continuous Blood Gluc Sensor (FREESTYLE LIBRE 2 SENSOR) MISC Use to check glucose continuously 12/20/21   Kaylyn Layer T, RPH-CPP  Dulaglutide (TRULICITY) 4.5 0000000 SOPN Inject 4.5 mg into the skin once a week. 11/07/21   Leone Haven, MD  empagliflozin (JARDIANCE) 25 MG TABS tablet Take 1 tablet (25 mg total) by mouth daily. 11/07/21   Leone Haven, MD  escitalopram (LEXAPRO) 20 MG tablet TAKE 1 TABLET (20  MG) BY MOUTH DAILY 11/23/21   Leone Haven, MD  ezetimibe (ZETIA) 10 MG tablet Take 1 tablet (10 mg total) by mouth daily. 11/23/21   Leone Haven, MD  fluticasone (FLONASE) 50 MCG/ACT nasal spray Place 2 sprays into both nostrils daily. 04/11/21   Leone Haven, MD  furosemide (LASIX) 20 MG tablet Take 1 tablet (20 mg total) by mouth daily. 01/27/22   Leone Haven, MD  gabapentin (NEURONTIN) 400 MG capsule Take 1 capsule (400 mg total) by mouth 3 (three) times daily as needed. 01/27/22   Leone Haven, MD  Insulin Glargine [facility]) 100 UNIT/ML Inject 30 Units into the skin daily.    [provider]  Insulin Pen Needle (PEN NEEDLES) 32G X 4 MM MISC Use to inject insulin up to 4 times daily 12/29/19   Leone Haven, MD  losartan (COZAAR) 100 MG tablet Take 1 tablet (100 mg total) by mouth daily. 01/27/22   Leone Haven, MD  metoprolol succinate (TOPROL-XL) 100 MG 24 hr tablet Take 1 tablet (100 mg total) by mouth daily. 01/27/22   Leone Haven, MD  pantoprazole (PROTONIX) 40 MG tablet Take 1 tablet (40 mg total) by mouth daily. 01/27/22   Leone Haven, MD      Allergies    Uloric [febuxostat]    Review of Systems   Review of Systems  Physical Exam Updated Vital Signs Wt 76.2 kg   BMI 31.74 kg/m  Physical Exam Vitals and nursing note reviewed.  Constitutional:      General: He is in acute distress.     Appearance: He is well-developed. He is ill-appearing.  HENT:     Head: Normocephalic and atraumatic.  Eyes:     Extraocular Movements: Extraocular movements intact.     Conjunctiva/sclera: Conjunctivae normal.     Pupils: Pupils are equal, round, and reactive to light.  Cardiovascular:     Rate and Rhythm: Normal rate and regular rhythm.     Pulses: Normal pulses.     Heart sounds: No murmur heard. Pulmonary:     Effort: Pulmonary effort is normal. No respiratory distress.     Breath sounds: Normal breath sounds.   Abdominal:     General: Bowel sounds are normal.     Palpations: Abdomen is soft.     Tenderness: There is no abdominal tenderness.  Musculoskeletal:        General: No swelling.     Cervical back: Neck supple.  Skin:    General: Skin is warm and dry.     Capillary Refill: Capillary refill takes less than 2 seconds.  Neurological:     Mental Status: He is alert.     Comments: Left-sided gaze present, 0+ out of 5 weakness in the right upper and right lower extremity, dense aphasia moving the left side of his extremities  Psychiatric:        Mood and Affect: Mood normal.     ED Results / Procedures / Treatments   Labs (all labs ordered are listed, but only abnormal results are displayed) Labs Reviewed  I-STAT CHEM 8, ED - Abnormal; Notable for the following components:      Result Value   Potassium 2.8 (*)    BUN 31 (*)    Creatinine, Ser 1.90 (*)    Glucose, Bld 292 (*)    All other components within normal limits  CBG MONITORING, ED - Abnormal; Notable for the following components:   Glucose-Capillary 266 (*)    All other components within normal limits  SARS CORONAVIRUS 2 BY RT PCR  RESP PANEL BY RT-PCR (RSV, FLU A&B, COVID)  [id number]  PROTIME-INR  APTT  CBC  DIFFERENTIAL  COMPREHENSIVE METABOLIC PANEL  ETHANOL    EKG None  Radiology CT HEAD CODE STROKE WO CONTRAST  Result Date: 03/24/2022 CLINICAL DATA:  Code stroke. Neuro deficit, acute, stroke suspected. Right-sided weakness. EXAM: CT HEAD WITHOUT CONTRAST TECHNIQUE: Contiguous axial images were obtained from the base of the skull through the vertex without intravenous contrast. RADIATION DOSE REDUCTION: This exam was performed according to the departmental dose-optimization program which includes automated exposure control, adjustment of the mA and/or kV according to patient size and/or use of iterative reconstruction technique. COMPARISON:  Head CT 08/24/2010. FINDINGS: Brain: Motion degraded study. No acute  intracranial hemorrhage. Moderate chronic small-vessel disease. Cortical gray-white differentiation is preserved. No hydrocephalus or extra-axial collection. No mass effect or midline shift. Vascular: No hyperdense vessel or unexpected calcification. Skull: No calvarial fracture or suspicious bone lesion. Skull base is unremarkable. Sinuses/Orbits: Unremarkable. Other: None. ASPECTS [facility] Stroke Program Early CT Score) - Ganglionic level infarction (caudate, lentiform nuclei, internal capsule, insula, M1-M3 cortex): 7 - Supraganglionic infarction (M4-M6 cortex): 3 Total score (0-10 with 10 being normal): 10 IMPRESSION: No acute intracranial hemorrhage.  ASPECT score is 10. Code stroke imaging results were communicated on 03/24/2022 at  12:45 pm to provider Dr. Rory Percy via secure text paging. Electronically Signed   By: Emmit Alexanders M.D.   On: 03/24/2022 12:45    Procedures .Critical Care  Performed by: Lennice Sites, DO Authorized by: Lennice Sites, DO   Critical care provider statement:    Critical care time (minutes):  35   Critical care was necessary to treat or prevent imminent or life-threatening deterioration of the following conditions:  CNS failure or compromise   Critical care was time spent personally by me on the following activities:  Blood draw for specimens, development of treatment plan with patient or surrogate, discussions with primary provider, evaluation of patient's response to treatment, examination of patient, obtaining history from patient or surrogate, interpretation of cardiac output measurements, ordering and performing treatments and interventions, ordering and review of laboratory studies, ordering and review of radiographic studies, pulse oximetry, re-evaluation of patient's condition and review of old charts   I assumed direction of critical care for this patient from another provider in my specialty: no       Medications Ordered in ED Medications  sodium chloride  flush (NS) 0.9 % injection 3 mL (has no administration in time range)  ondansetron (ZOFRAN) injection 4 mg (has no administration in time range)   stroke: early stages of recovery book (has no administration in time range)  0.9 %  sodium chloride infusion (has no administration in time range)  acetaminophen (TYLENOL) tablet 650 mg (has no administration in time range)    Or  acetaminophen (TYLENOL) 160 MG/5ML solution 650 mg (has no administration in time range)    Or  acetaminophen (TYLENOL) suppository 650 mg (has no administration in time range)  senna-docusate (Senokot-S) tablet 1 tablet (has no administration in time range)  iohexol (OMNIPAQUE) 350 MG/ML injection 75 mL (75 mLs Intravenous Contrast Given 03/24/22 1248)    ED Course/ Medical Decision Making/ A&P                             Medical Decision Making Amount and/or Complexity of Data Reviewed Labs: ordered. Radiology: ordered.  Risk Decision regarding hospitalization.   Shane Jones is here with stroke symptoms.  Normal vitals.  No fever.  Patient with dense aphasia, right-sided weakness.  Went directly to CT with Dr. Malen Gauze with neurology.  CT CTA shows P1 occlusion.  Ultimately decision was made for patient to be taken to IR for thrombectomy.  TNK was held due to possibly last known normal being maybe yesterday.  However enough there and imaging to go in for thrombectomy.  Patient protecting airway at this time.  Will send for thrombectomy.  Labs have been sent.  Per my review and interpretation there is no significant anemia or electrolyte abnormality or kidney injury.  Hemodynamically stable throughout my care.  Admitted to the neurological ICU after thrombectomy.  This chart was dictated using voice recognition software.  Despite best efforts to proofread,  errors can occur which can change the documentation meaning.         Final Clinical Impression(s) / ED Diagnoses Final diagnoses:  Cerebrovascular  accident (CVA), unspecified mechanism [facility])    Rx / DC Orders ED Discharge Orders     None         Lennice Sites, DO 03/24/22 1315

## 2022-03-24 NOTE — Progress Notes (Signed)
MD okay w/ non [name] IV while on Levo. IV team unable to gain [name] IV access and MD made aware

## 2022-03-24 NOTE — Anesthesia Procedure Notes (Signed)
Procedure Name: Intubation Date/Time: 03/24/2022 1:15 PM  Performed by: Minerva Ends, CRNAPre-anesthesia Checklist: Patient identified, Emergency Drugs available, Suction available and Patient being monitored Patient Re-evaluated:Patient Re-evaluated prior to induction Oxygen Delivery Method: Circle system utilized Preoxygenation: Pre-oxygenation with 100% oxygen Induction Type: IV induction, Cricoid Pressure applied and Rapid sequence Ventilation: Mask ventilation without difficulty Laryngoscope Size: Mac and 3 Grade View: Grade II Tube type: Oral Tube size: 7.0 mm Number of attempts: 1 Airway Equipment and Method: Stylet and Oral airway Placement Confirmation: ETT inserted through vocal cords under direct vision, positive ETCO2 and breath sounds checked- equal and bilateral Secured at: 23 cm Tube secured with: Tape Dental Injury: Teeth and Oropharynx as per pre-operative assessment

## 2022-03-24 NOTE — H&P (Signed)
Stroke Neurology Admission History & Physical   CC: Code stroke for right hemiplegia, left-sided gaze preference  History is obtained from: EMS, chart, patient's son over the phone  HPI: Shane Jones is a 77 y.o. male past medical history of uncontrolled diabetes, sleep apnea, hypertension who had not been doing well for the past few days and was taken to his primary care's office for management of high blood sugars, was noted to have a sudden onset of bradycardia, unresponsiveness and vomiting at the doctor's office.  EMS was called and they noted that he has a leftward gaze preference as well as right hemiplegia, and activated a code stroke and brought him to the ER. He was evaluated to the bridge emergently.  Taken for noncontrast head CT.  He was extremely agitated and obtaining images took an extensively long amount of time because of his agitation.  Sedating medications were not used so as to not confound his exam. Finally head CT was obtained-no acute changes. CT angiography head and neck was then performed which showed a left P1 occlusion and severe stenosis of the right P2. I reached out to his son to get further history reports that he has at least not been doing well for a day maybe 3 to 4 days.  He could not tell me a clear last known well. Not a candidate for IV thrombolysis due to unclear last known well. Due to the fact that CT angiography shows a very proximal P1 occlusion, this case was discussed with neuroendovascular for possible thrombectomy.  The CT head shows no acute changes making this potentially intervenable lesion.  This was discussed with son over the phone who agreed to proceed with the intervention.   Of note he had multiple episode of emesis-at the doctor's office as well as on the stretcher in the truck and in the hospital.  LKW: Outside the window for TNKase but may be within the last 24 hours, potentially longer.  Unclear IV thrombolysis given?: no, unclear last  known well Premorbid modified Rankin scale (mRS): 2-3   ROS: Unable to perform due to his mentation  Past Medical History:  Diagnosis Date   Diabetes mellitus without complication ([place])    Gout    Hypertension    Sleep apnea    has CPAP, doesn't use   Ulcer of esophagus without bleeding    Wears dentures    full upper and lower     No family history on file.   Social History:   reports that he quit smoking about 43 years ago. His smoking use included cigarettes. He has never used smokeless tobacco. He reports that he does not currently use alcohol after a past usage of about 1.0 standard drink of alcohol per week. He reports that he does not use drugs.  Medications  Current Facility-Administered Medications:    [START ON 03/25/2022]  stroke: early stages of recovery book, , Does not apply, Once, Amie Portland, MD   0.9 %  sodium chloride infusion, , Intravenous, Continuous, Amie Portland, MD   acetaminophen (TYLENOL) tablet 650 mg, 650 mg, Oral, Q4H PRN **OR** acetaminophen (TYLENOL) 160 MG/5ML solution 650 mg, 650 mg, Per Tube, Q4H PRN **OR** acetaminophen (TYLENOL) suppository 650 mg, 650 mg, Rectal, Q4H PRN, Amie Portland, MD   ondansetron (ZOFRAN) injection 4 mg, 4 mg, Intravenous, Once, Shafer, Devon, NP   senna-docusate (Senokot-S) tablet 1 tablet, 1 tablet, Oral, QHS PRN, Amie Portland, MD   sodium chloride flush (NS) 0.9 %  injection 3 mL, 3 mL, Intravenous, Once, Curatolo, Adam, DO  Current Outpatient Medications:    allopurinol (ZYLOPRIM) 100 MG tablet, Take 1 tablet (100 mg total) by mouth daily., Disp: 90 tablet, Rfl: 1   amLODipine (NORVASC) 10 MG tablet, Take 1 tablet (10 mg total) by mouth daily., Disp: 90 tablet, Rfl: 1   aspirin EC 81 MG tablet, Take 81 mg by mouth daily. Swallow whole., Disp: , Rfl:    atorvastatin (LIPITOR) 80 MG tablet, Take 1 tablet (80 mg total) by mouth daily., Disp: 90 tablet, Rfl: 1   blood glucose meter kit and supplies, Dispense based  on patient and insurance preference. Use up to four times daily as directed. (FOR ICD-10 E10.9, E11.9)., Disp: 1 each, Rfl: 0   Continuous Blood Gluc Sensor (FREESTYLE LIBRE 2 SENSOR) MISC, Use to check glucose continuously, Disp: 6 each, Rfl: 3   Dulaglutide (TRULICITY) 4.5 0000000 SOPN, Inject 4.5 mg into the skin once a week., Disp: 8 mL, Rfl: 0   empagliflozin (JARDIANCE) 25 MG TABS tablet, Take 1 tablet (25 mg total) by mouth daily., Disp: 90 tablet, Rfl: 3   escitalopram (LEXAPRO) 20 MG tablet, TAKE 1 TABLET (20 MG) BY MOUTH DAILY, Disp: 90 tablet, Rfl: 1   ezetimibe (ZETIA) 10 MG tablet, Take 1 tablet (10 mg total) by mouth daily., Disp: 90 tablet, Rfl: 1   fluticasone (FLONASE) 50 MCG/ACT nasal spray, Place 2 sprays into both nostrils daily., Disp: 16 g, Rfl: 6   furosemide (LASIX) 20 MG tablet, Take 1 tablet (20 mg total) by mouth daily., Disp: 90 tablet, Rfl: 1   gabapentin (NEURONTIN) 400 MG capsule, Take 1 capsule (400 mg total) by mouth 3 (three) times daily as needed., Disp: 270 capsule, Rfl: 0   Insulin Glargine (BASAGLAR KWIKPEN) 100 UNIT/ML, Inject 30 Units into the skin daily., Disp: , Rfl:    Insulin Pen Needle (PEN NEEDLES) 32G X 4 MM MISC, Use to inject insulin up to 4 times daily, Disp: 400 each, Rfl: 3   losartan (COZAAR) 100 MG tablet, Take 1 tablet (100 mg total) by mouth daily., Disp: 90 tablet, Rfl: 1   metoprolol succinate (TOPROL-XL) 100 MG 24 hr tablet, Take 1 tablet (100 mg total) by mouth daily., Disp: 90 tablet, Rfl: 1   pantoprazole (PROTONIX) 40 MG tablet, Take 1 tablet (40 mg total) by mouth daily., Disp: 90 tablet, Rfl: 1   Exam: Current vital signs: Wt 76.2 kg   BMI 31.74 kg/m  Vital signs in last 24 hours: Temp:  [97.4 F (36.3 C)] 97.4 F (36.3 C) (03/22 1209) Pulse Rate:  [62] 62 (03/22 1209) BP: (114-118)/(60-74) 118/74 (03/22 1209) SpO2:  [98 %] 98 % (03/22 1209) Weight:  [75.3 kg-76.2 kg] 76.2 kg (03/22 1229) General: He is awake, alert,  extremely agitated HEENT: Normocephalic atraumatic Lungs: Clear Cardiovascular: Regular rhythm Neurological exam He is awake alert agitated Does not respond to any questions Nonverbal Does not follow commands Cranial nerves: Pupils equal round reactive light, has leftward gaze preference, does not blink to threat from the right, blinks to threat from the left, mild right facial droop, Motor examination with flaccid right upper and lower extremity.  Full strength left upper and lower extremity as evidenced by his spontaneous movement. Sensation also diminished on the right as evidenced by no grimace or withdrawal to noxious stimulation.  Intact on the left. Coordination difficult to assess given his mentation NIHSS 1a Level of Conscious.: 0 1b LOC Questions: 2 1c  LOC Commands: 2 2 Best Gaze: 2 3 Visual: 2 4 Facial Palsy: 1 5a Motor Arm - left: 0 5b Motor Arm - Right: 4 6a Motor Leg - Left: 0 6b Motor Leg - Right: 4 7 Limb Ataxia: 0 8 Sensory: 2 9 Best Language: 3 10 Dysarthria: 2 11 Extinct. and Inatten.: 0 TOTAL: 26   Labs I have reviewed labs in epic and the results pertinent to this consultation are: CBC    Component Value Date/Time   WBC 8.3 03/24/2022 1226   RBC 4.65 03/24/2022 1226   HGB 14.3 03/24/2022 1235   HCT 42.0 03/24/2022 1235   PLT 184 03/24/2022 1226   MCV 89.5 03/24/2022 1226   MCH 32.0 03/24/2022 1226   MCHC 35.8 03/24/2022 1226   RDW 12.3 03/24/2022 1226   LYMPHSABS 3.6 03/24/2022 1226   MONOABS 0.6 03/24/2022 1226   EOSABS 0.1 03/24/2022 1226   BASOSABS 0.0 03/24/2022 1226    CMP     Component Value Date/Time   NA 139 03/24/2022 1235   K 2.8 (L) 03/24/2022 1235   CL 99 03/24/2022 1235   CO2 30 01/27/2022 1054   GLUCOSE 292 (H) 03/24/2022 1235   BUN 31 (H) 03/24/2022 1235   CREATININE 1.90 (H) 03/24/2022 1235   CREATININE 1.15 10/07/2018 1351   CALCIUM 10.1 01/27/2022 1054   PROT 6.9 01/27/2022 1054   ALBUMIN 4.2 01/27/2022 1054    AST 14 01/27/2022 1054   ALT 10 01/27/2022 1054   ALKPHOS 64 01/27/2022 1054   BILITOT 0.5 01/27/2022 1054   GFRNONAA 40 (L) 12/15/2021 1354   Imaging I have reviewed the images obtained:  CT-head-extensive motion.  No acute issues CT angio head and neck: No emergent large vessel occlusion in the anterior circulation.  There is an acute occlusion of the left P1 and possible acute occlusion of the right P2.  Assessment:  77 year old with above past medical history that includes a significant history of uncontrolled diabetes brought in for evaluation of sudden onset of leftward gaze preference and right hemiplegia while at doctor's office where he was taken for evaluation because he was not feeling well for the past few days but worsened over the last 24 hours. Unclear last known well but not in the window for IV TNKase. Noncontrast head CT with no evidence of bleed CT angio head and neck with a left P1 occlusion which is extremely proximal and right P2 occlusion. I discussed the case with the interventionalist and his son regarding possible intervention although timeline was unclear.  The reason I considered thrombectomy of the left P1 is that it is a very proximal clot and I fear that left untreated, it may progress proximally to involve the basilar tip and at that time can be fatal. Risk benefits alternatives discussed with the son-he agreed for the procedure CT and CT angio reviewed personally.  Plan: Acuity: Acute Current Suspected Etiology: Atheroembolic versus cardioembolic Continue Evaluation:  -Admit to: Neuro ICU after intervention -Aspirin and Plavix -Blood pressure control based on results of endovascular process-if successfully revascularized, systolic blood pressure goal between 120-140.  If unsuccessful revascularization, then allow for permissive hypertension. -MRI/ECHO/A1C/Lipid panel. -Hyperglycemia management per SSI to maintain glucose 140-180mg /dL. -PT/OT/ST therapies  and recommendations when able  CNS Monitor clinically PT OT and PMR consult for hemiplegia affecting the right dominant side  RESP Intubated for the procedure Wean ventilator as tolerated May need PCCM consult  CV No active issues Blood pressure goals as above  HEME  No active issues.  ENDO Uncontrolled diabetes Goal A1c less than 7 SSI  GI/GU Possible chronic kidney disease stage III hypo- Gentle hydration   Fluid/Electrolyte Disorders Hypokalemia-replete potassium  ID Possible Aspiration PNA -CXR -NPO -Monitor  Prophylaxis DVT: Subcu heparin GI: PPI Bowel: Docusate senna  Diet: NPO until cleared by speech/bedside swallow eval  Code Status: Full Code    THE FOLLOWING WERE PRESENT ON ADMISSION: Acute ischemic stroke. Aspiration pna, Hyperglycemia   -- Amie Portland, MD Neurologist [facility] Pager: [phone]   CRITICAL CARE ATTESTATION Performed by: Amie Portland, MD Total critical care time: 60 minutes Critical care time was exclusive of separately billable procedures and treating other patients and/or supervising APPs/Residents/Students Critical care was necessary to treat or prevent imminent or life-threatening deterioration. This patient is critically ill and at significant risk for neurological worsening and/or death and care requires constant monitoring. Critical care was time spent personally by me on the following activities: development of treatment plan with patient and/or surrogate as well as nursing, discussions with consultants, evaluation of patient's response to treatment, examination of patient, obtaining history from patient or surrogate, ordering and performing treatments and interventions, ordering and review of laboratory studies, ordering and review of radiographic studies, pulse oximetry, re-evaluation of patient's condition, participation in multidisciplinary rounds and medical decision making of high complexity in the  care of this patient.

## 2022-03-24 NOTE — Progress Notes (Signed)
[name], MD Phone: [phone]  Shane Jones is a 77 y.o. male who presents today for follow-up.  Patient presented today for follow-up of his diabetes and possible memory issues.  He presented with his son and daughter.  Per staff report the patient was his typical bubbly self on the way back to the room and then once he got to the room his demeanor changed and he was not able to answer questions and appeared angry.  When I entered the room the patient appeared to briefly smile at me and pointed at me that then went to just sitting in the chair.  He would not answer any questions.  He did not respond to painful stimuli.  He was breathing and did have a pulse.  EMS was called right away.  The patient was monitored and follow-up blood pressure was 114/60.  He was placed on 2 L nasal cannula and oxygen was 96.  Pulse initially was in the low 40s though prior to leaving with EMS his pulse rebounded to 72.  His glucose was checked and it was 326.  The patient progressively looked more pale.  The fire department arrived and the patient did briefly stop breathing while the fire department was present.  The patient was moved to the floor and subsequently started to breathe again.  He subsequently vomited and he was turned on his side by the fire department.  We subsequently moved him out of the way and he continued to breathe and have a faint pulse.  Nasal cannula had been removed while he was moved to the floor and his oxygen with that being removed dropped down to 86%.  He continued in this condition until EMS arrived and they were able to get him onto the stretcher and out to the ambulance.  Report during this process was given to EMS and the fire department.  Social History   Tobacco Use  Smoking Status Former   Types: Cigarettes   Quit date: 11/20/1978   Years since quitting: 43.3  Smokeless Tobacco Never    No current facility-administered medications on file prior to visit.   Current  Outpatient Medications on File Prior to Visit  Medication Sig Dispense Refill   allopurinol (ZYLOPRIM) 100 MG tablet Take 1 tablet (100 mg total) by mouth daily. 90 tablet 1   amLODipine (NORVASC) 10 MG tablet Take 1 tablet (10 mg total) by mouth daily. 90 tablet 1   aspirin EC 81 MG tablet Take 81 mg by mouth daily. Swallow whole.     atorvastatin (LIPITOR) 80 MG tablet Take 1 tablet (80 mg total) by mouth daily. 90 tablet 1   blood glucose meter kit and supplies Dispense based on patient and insurance preference. Use up to four times daily as directed. (FOR ICD-10 E10.9, E11.9). 1 each 0   Continuous Blood Gluc Sensor (FREESTYLE LIBRE 2 SENSOR) MISC Use to check glucose continuously 6 each 3   Dulaglutide (TRULICITY) 4.5 0000000 SOPN Inject 4.5 mg into the skin once a week. 8 mL 0   empagliflozin (JARDIANCE) 25 MG TABS tablet Take 1 tablet (25 mg total) by mouth daily. 90 tablet 3   escitalopram (LEXAPRO) 20 MG tablet TAKE 1 TABLET (20 MG) BY MOUTH DAILY 90 tablet 1   ezetimibe (ZETIA) 10 MG tablet Take 1 tablet (10 mg total) by mouth daily. 90 tablet 1   fluticasone (FLONASE) 50 MCG/ACT nasal spray Place 2 sprays into both nostrils daily. 16 g 6  furosemide (LASIX) 20 MG tablet Take 1 tablet (20 mg total) by mouth daily. 90 tablet 1   gabapentin (NEURONTIN) 400 MG capsule Take 1 capsule (400 mg total) by mouth 3 (three) times daily as needed. 270 capsule 0   Insulin Glargine (BASAGLAR KWIKPEN) 100 UNIT/ML Inject 30 Units into the skin daily.     Insulin Pen Needle (PEN NEEDLES) 32G X 4 MM MISC Use to inject insulin up to 4 times daily 400 each 3   losartan (COZAAR) 100 MG tablet Take 1 tablet (100 mg total) by mouth daily. 90 tablet 1   metoprolol succinate (TOPROL-XL) 100 MG 24 hr tablet Take 1 tablet (100 mg total) by mouth daily. 90 tablet 1   pantoprazole (PROTONIX) 40 MG tablet Take 1 tablet (40 mg total) by mouth daily. 90 tablet 1     ROS see history of present  illness  Objective  Physical Exam Vitals:   03/24/22 1137 03/24/22 1209  BP: 114/60 118/74  Pulse:  62  Temp:  (!) 97.4 F (36.3 C)  SpO2:  98%    BP Readings from Last 3 Encounters:  03/24/22 118/74  02/16/22 123/77  02/02/22 (!) 134/91   Wt Readings from Last 3 Encounters:  03/24/22 167 lb 15.9 oz (76.2 kg)  03/24/22 166 lb (75.3 kg)  02/16/22 167 lb (75.8 kg)    Physical Exam Constitutional:      Appearance: He is ill-appearing.     Comments: Not responsive to verbal or pain stimuli  HENT:     Mouth/Throat:     Comments: Lips pale Cardiovascular:     Rate and Rhythm: Regular rhythm.     Comments: Faint radial pulse Pulmonary:     Comments: Respiratory effort increased     Assessment/Plan: Please see individual problem list.  Type 2 diabetes mellitus with hyperglycemia, with long-term current use of insulin (HCC) Assessment & Plan: Chronic issue.  Uncontrolled based on A1c.  We will deal with this issue when the patient is able to follow-up moving forward.  Orders: -     POCT glucose (manual entry) -     POCT glycosylated hemoglobin (Hb A1C)  Altered mental status, unspecified altered mental status type Assessment & Plan: Patient with acute onset altered mental status concerning for stroke.  Could also potentially be a cardiac cause.  His sugar was not low.  Patient will be transported to the emergency department by EMS.  Orders: -     POCT glucose (manual entry)    No follow-ups on file.   [name], MD [place]

## 2022-03-24 NOTE — Progress Notes (Signed)
Pharmacy Antibiotic Note  Shane Jones is a 77 y.o. male admitted on 03/24/2022 with pneumonia.  Pharmacy has been consulted for unasyn dosing.  Pt admitted for CVA. Suspecting asp PNA. Empiric Unasyn.  Scr 1.9  Plan: Unasyn 3g IV q8 F/u cultures  Height: 5\' 5"  (165.1 cm) Weight: 76.2 kg (167 lb 15.9 oz) IBW/kg (Calculated) : 61.5  Temp (24hrs), Avg:97.4 F (36.3 C), Min:97.4 F (36.3 C), Max:97.4 F (36.3 C)  Recent Labs  Lab 03/24/22 1226 03/24/22 1235  WBC 8.3  --   CREATININE 1.97* 1.90*    Estimated Creatinine Clearance: 30.1 mL/min (A) (by C-G formula based on SCr of 1.9 mg/dL (H)).    Allergies  Allergen Reactions   Uloric [Febuxostat] Hives    Antimicrobials this admission: 3/22 Unasyn>>  Dose adjustments this admission:   Microbiology results: Resp panel neg flu/covid/RSV   Onnie Boer, PharmD, BCIDP, AAHIVP, CPP Infectious Disease Pharmacist 03/24/2022 4:16 PM

## 2022-03-24 NOTE — Addendum Note (Signed)
Addended by: Leone Haven on: 03/24/2022 04:14 PM   Modules accepted: Level of Service

## 2022-03-24 NOTE — Anesthesia Preprocedure Evaluation (Signed)
Anesthesia Evaluation  Patient identified by MRN, date of birth, ID band Patient confused    Reviewed: Allergy & Precautions, Patient's Chart, lab work & pertinent test results, Unable to perform ROS - Chart review onlyPreop documentation limited or incomplete due to emergent nature of procedure.  Airway Mallampati: Unable to assess  TM Distance: >3 FB     Dental  (+) Edentulous Upper, Edentulous Lower   Pulmonary sleep apnea , former smoker   Pulmonary exam normal        Cardiovascular hypertension, Pt. on medications and Pt. on home beta blockers  Rhythm:Regular Rate:Normal     Neuro/Psych  Headaches PSYCHIATRIC DISORDERS Anxiety Depression    CVA, Residual Symptoms    GI/Hepatic PUD,,,  Endo/Other  diabetes, Type 2, Insulin Dependent, Oral Hypoglycemic Agents    Renal/GU CRFRenal disease     Musculoskeletal   Abdominal   Peds  Hematology   Anesthesia Other Findings On GLP1-a   Reproductive/Obstetrics                              Anesthesia Physical Anesthesia Plan  ASA: 4 and emergent  Anesthesia Plan: General   Post-op Pain Management: Minimal or no pain anticipated   Induction: Intravenous  PONV Risk Score and Plan: 2 and Treatment may vary due to age or medical condition and Ondansetron  Airway Management Planned: Oral ETT  Additional Equipment: Arterial line  Intra-op Plan:   Post-operative Plan: Possible Post-op intubation/ventilation  Informed Consent:      History available from chart only and Only emergency history available  Plan Discussed with: CRNA and Anesthesiologist  Anesthesia Plan Comments:          Anesthesia Quick Evaluation

## 2022-03-24 NOTE — Sedation Documentation (Signed)
Pt taken to 3M12 with this RN and CRNA. Hand off of care completed. Bilateral DP pulses present at handoff. Right groin site clean, dry and intact. No bleeding or hematoma noted.

## 2022-03-24 NOTE — Procedures (Signed)
INR.  Status post left vertebral artery arteriogram.  Right CFA approach.  Findings.  Occluded  left posterior cerebral artery proximally.  Multiple attempts to access the left posterior cerebral artery unsuccessful due to significant tortuosity at the aortic arch,  at the origin of the left subclavian artery, and tortuosity throughout the dominant left vertebral artery.  8 French Angio-Seal closure device deployed for hemostasis of the right groin puncture site.  Distal pulses all dopplerable bilaterally unchanged from prior to procedure.  Patient left intubated due to preprocedural vomiting with suspected aspiration.  Shane Hopping MD.

## 2022-03-24 NOTE — Consult Note (Addendum)
NAME:  Shane Jones, MRN:  [id number], DOB:  1945-12-14, LOS: 0 ADMISSION DATE:  03/24/2022, CONSULTATION DATE:  03/24/22 REFERRING MD:  Genevie Cheshire, CHIEF COMPLAINT:  endotracheally intubated    History of Present Illness:  77 yo M PMH DM, OSA, HTN who had sudden onset AMS/bradycardia/vomiting while at Dr office for mgmgnt of hyperglycemia. EMS called in this setting, noted pt to have leftward gaze + R hemiplegia prompting code stroke activation and ER presentation. CTA with L P1 occlusion and R P2 severe stenosis.  Unclear LKN-- sounds like hasn't been doing well for a few days-- but did go to NIR for arteriogram. Unable to access L PCA however due to tortuosity at aortic arch, L Subclav, and L vertebral art.   Remains intubated after case due to pre-procedural vomiting.   PCCM consulted in thsi setting  Pertinent  Medical History  DM HTN OSA   Significant Hospital Events: Including procedures, antibiotic start and stop dates in addition to other pertinent events   L PCA occlusion. NIR -- unsuccessful intervention. Remains intubated   Interim History / Subjective:  Remains intubated after NIR  Was on 75 propofol and got a bit hypotensive, Neo started, 500cc LR bolus ordered Propofol discontinued  Objective   Blood pressure 130/80, pulse 65, resp. rate 18, height 5\' 5"  (1.651 m), weight 76.2 kg.    Vent Mode: PRVC FiO2 (%):  [50 %] 50 % Set Rate:  [18 bmp] 18 bmp Vt Set:  [380 mL] 380 mL PEEP:  [5 cmH20] 5 cmH20   Intake/Output Summary (Last 24 hours) at 03/24/2022 1511 Last data filed at 03/24/2022 1440 Gross per 24 hour  Intake --  Output 600 ml  Net -600 ml   Filed Weights   03/24/22 1229  Weight: 76.2 kg    Examination: General: Adult male, resting in bed, in NAD. Neuro: Sedated, not responsive. HEENT: [place]/AT. Sclerae anicteric. ETT in place. Cardiovascular: RRR, no M/R/G.  Lungs: Respirations even and unlabored.  CTA bilaterally, No W/R/R. Abdomen: BS x 4,  soft, NT/ND.  Musculoskeletal: No gross deformities, no edema.  Skin: Intact, warm, no rashes.   Assessment & Plan:   L PCA occlusion with R P2 severe stenosis- attempted neuro IR thrombectomy but unfortunately not successful due to due to tortuosity at aortic arch, L Subclav, and L vertebral art.  - Neuro following, primary stroke management per them. - F/u MRI, echo. - Permissive HTN, goal SBP 140 - 160 (RN to confirm with neuro). - ASA/Plavix/Statin. - PT/OT/ST when able.  Respiratory insufficiency with hx multiple episodes emesis on ED arrival - s/p intubation for attempted neuro IR intervention and left intubated given emesis. Concern for aspiration 2/2 above. Hx OSA - non-compliant with CPAP. - Full vent support. - Get CXR. - SBT in AM for hopeful extubation. - Start Unasyn empirically. - Trach aspirate. - Bronchial hygiene.  Hypotension - presumed exacerbated by high dose Propofol (was on 29mcg/kg/min on ICU arrival) + possible aspiration/shock. - D/c Propofol. - D/c Neo as got brady. - Start Levo. - Might need CVL but hope to avoid as suspect he'll bounce back soon (was previously normotensive). - Check PCT, lactic acid.  Hypokalemia. AKI. - 80 mEq K per tube. - Check Mag. - Follow up BMP tonight and AM.  Hx HTN, HLD. - Permissive HTN as above. - Continue home Atorvastatin, ASA, Zetia. - Hold home Furosemide, Losartan, Toprol-XL.  DM2 poorly controlled. - SSI. - Hold home Dulaglutide, Empagliflozin, Insulin.  Hx Gout. - Hold home Allopurinol.  Best Practice (right click and "Reselect all SmartList Selections" daily)   Diet/type: NPO DVT prophylaxis: prophylactic heparin  GI prophylaxis: PPI Lines: N/A Foley:  N/A Code Status:  full code Last date of multidisciplinary goals of care discussion: None yet.  Labs   CBC: Recent Labs  Lab 03/24/22 1226 03/24/22 1235  WBC 8.3  --   NEUTROABS 4.0  --   HGB 14.9 14.3  HCT 41.6 42.0  MCV 89.5  --    PLT 184  --     Basic Metabolic Panel: Recent Labs  Lab 03/24/22 1226 03/24/22 1235  NA 135 139  K 2.7* 2.8*  CL 101 99  CO2 25  --   GLUCOSE 291* 292*  BUN 29* 31*  CREATININE 1.97* 1.90*  CALCIUM 8.6*  --    GFR: Estimated Creatinine Clearance: 30.1 mL/min (A) (by C-G formula based on SCr of 1.9 mg/dL (H)). Recent Labs  Lab 03/24/22 1226  WBC 8.3    Liver Function Tests: Recent Labs  Lab 03/24/22 1226  AST 23  ALT 14  ALKPHOS 64  BILITOT 0.6  PROT 6.0*  ALBUMIN 3.4*   No results for input(s): "LIPASE", "AMYLASE" in the last 168 hours. No results for input(s): "AMMONIA" in the last 168 hours.  ABG    Component Value Date/Time   TCO2 25 03/24/2022 1235     Coagulation Profile: Recent Labs  Lab 03/24/22 1226  INR 1.0    Cardiac Enzymes: No results for input(s): "CKTOTAL", "CKMB", "CKMBINDEX", "TROPONINI" in the last 168 hours.  HbA1C: Hemoglobin A1C  Date/Time Value Ref Range Status  03/24/2022 12:13 PM 11.1 (A) 4.0 - 5.6 % Final   Hgb A1c MFr Bld  Date/Time Value Ref Range Status  08/09/2021 02:00 PM 8.5 (H) 4.6 - 6.5 % Final    Comment:    Glycemic Control Guidelines for People with Diabetes:Non Diabetic:  <6%Goal of Therapy: <7%Additional Action Suggested:  >8%   02/02/2021 04:43 PM 8.9 (H) 4.6 - 6.5 % Final    Comment:    Glycemic Control Guidelines for People with Diabetes:Non Diabetic:  <6%Goal of Therapy: <7%Additional Action Suggested:  >8%     CBG: Recent Labs  Lab 03/24/22 1224  GLUCAP 266*    Review of Systems:   Unable to obtain as pt is encephalopathic.  Past Medical History:  He,  has a past medical history of Diabetes mellitus without complication ([place]), Gout, Hypertension, Sleep apnea, Ulcer of esophagus without bleeding, and Wears dentures.   Surgical History:   Past Surgical History:  Procedure Laterality Date   BALLOON DILATION N/A 10/22/2017   Procedure: BALLOON DILATION;  Surgeon: Lucilla Lame, MD;   Location: [facility];  Service: Endoscopy;  Laterality: N/A;   CATARACT EXTRACTION W/PHACO Right 02/02/2022   Procedure: CATARACT EXTRACTION PHACO AND INTRAOCULAR LENS PLACEMENT (I[facility]) RIGHT DIABETIC  6.48  00:45.6;  Surgeon: Norvel Richards, MD;  Location: [facility];  Service: Ophthalmology;  Laterality: Right;  Diabetic   CATARACT EXTRACTION W/PHACO Left 02/16/2022   Procedure: CATARACT EXTRACTION PHACO AND INTRAOCULAR LENS PLACEMENT ([place]) LEFT DIABTIC  7.58  00:46.2;  Surgeon: Norvel Richards, MD;  Location: [facility];  Service: Ophthalmology;  Laterality: Left;  Diabetic   ESOPHAGOGASTRODUODENOSCOPY (EGD) WITH PROPOFOL N/A 10/22/2017   Procedure: ESOPHAGOGASTRODUODENOSCOPY (EGD) WITH Biopsy;  Surgeon: Lucilla Lame, MD;  Location: [place] [facility];  Service: Endoscopy;  Laterality: N/A;   TONSILLECTOMY  Social History:   reports that he quit smoking about 43 years ago. His smoking use included cigarettes. He has never used smokeless tobacco. He reports that he does not currently use alcohol after a past usage of about 1.0 standard drink of alcohol per week. He reports that he does not use drugs.   Family History:  His family history is not on file.   Allergies Allergies  Allergen Reactions   Uloric [Febuxostat] Hives     Home Medications  Prior to Admission medications   Medication Sig Start Date End Date Taking? Authorizing Provider  allopurinol (ZYLOPRIM) 100 MG tablet Take 1 tablet (100 mg total) by mouth daily. 01/27/22   Leone Haven, MD  amLODipine (NORVASC) 10 MG tablet Take 1 tablet (10 mg total) by mouth daily. 01/27/22   Leone Haven, MD  aspirin EC 81 MG tablet Take 81 mg by mouth daily. Swallow whole.    [provider]  atorvastatin (LIPITOR) 80 MG tablet Take 1 tablet (80 mg total) by mouth daily. 01/27/22   Leone Haven, MD  blood glucose meter kit and supplies Dispense based on patient and insurance  preference. Use up to four times daily as directed. (FOR ICD-10 E10.9, E11.9). 03/11/18   Leone Haven, MD  Continuous Blood Gluc Sensor (FREESTYLE LIBRE 2 SENSOR) MISC Use to check glucose continuously 12/20/21   Kaylyn Layer T, RPH-CPP  Dulaglutide (TRULICITY) 4.5 0000000 SOPN Inject 4.5 mg into the skin once a week. 11/07/21   Leone Haven, MD  empagliflozin (JARDIANCE) 25 MG TABS tablet Take 1 tablet (25 mg total) by mouth daily. 11/07/21   Leone Haven, MD  escitalopram (LEXAPRO) 20 MG tablet TAKE 1 TABLET (20 MG) BY MOUTH DAILY 11/23/21   Leone Haven, MD  ezetimibe (ZETIA) 10 MG tablet Take 1 tablet (10 mg total) by mouth daily. 11/23/21   Leone Haven, MD  fluticasone (FLONASE) 50 MCG/ACT nasal spray Place 2 sprays into both nostrils daily. 04/11/21   Leone Haven, MD  furosemide (LASIX) 20 MG tablet Take 1 tablet (20 mg total) by mouth daily. 01/27/22   Leone Haven, MD  gabapentin (NEURONTIN) 400 MG capsule Take 1 capsule (400 mg total) by mouth 3 (three) times daily as needed. 01/27/22   Leone Haven, MD  Insulin Glargine [facility]) 100 UNIT/ML Inject 30 Units into the skin daily.    [provider]  Insulin Pen Needle (PEN NEEDLES) 32G X 4 MM MISC Use to inject insulin up to 4 times daily 12/29/19   Leone Haven, MD  losartan (COZAAR) 100 MG tablet Take 1 tablet (100 mg total) by mouth daily. 01/27/22   Leone Haven, MD  metoprolol succinate (TOPROL-XL) 100 MG 24 hr tablet Take 1 tablet (100 mg total) by mouth daily. 01/27/22   Leone Haven, MD  pantoprazole (PROTONIX) 40 MG tablet Take 1 tablet (40 mg total) by mouth daily. 01/27/22   Leone Haven, MD     Critical care time: 35 min.    Montey Hora, [facility] For pager details, please see AMION or use Epic chat  After 1900, please call [facility] for cross coverage needs 03/24/2022, 3:47 PM

## 2022-03-24 NOTE — Assessment & Plan Note (Signed)
Patient with acute onset altered mental status concerning for stroke.  Could also potentially be a cardiac cause.  His sugar was not low.  Patient will be transported to the emergency department by EMS.

## 2022-03-25 ENCOUNTER — Inpatient Hospital Stay (HOSPITAL_COMMUNITY): Payer: PPO

## 2022-03-25 ENCOUNTER — Encounter (HOSPITAL_COMMUNITY): Payer: Self-pay | Admitting: Radiology

## 2022-03-25 DIAGNOSIS — I639 Cerebral infarction, unspecified: Secondary | ICD-10-CM | POA: Diagnosis not present

## 2022-03-25 LAB — GLUCOSE, CAPILLARY
Glucose-Capillary: 212 mg/dL — ABNORMAL HIGH (ref 70–99)
Glucose-Capillary: 175 mg/dL — ABNORMAL HIGH (ref 70–99)
Glucose-Capillary: 193 mg/dL — ABNORMAL HIGH (ref 70–99)
Glucose-Capillary: 180 mg/dL — ABNORMAL HIGH (ref 70–99)
Glucose-Capillary: 119 mg/dL — ABNORMAL HIGH (ref 70–99)
Glucose-Capillary: 158 mg/dL — ABNORMAL HIGH (ref 70–99)
Glucose-Capillary: 175 mg/dL — ABNORMAL HIGH (ref 70–99)

## 2022-03-25 LAB — LIPID PANEL
Cholesterol: 128 mg/dL (ref 0–200)
HDL: 28 mg/dL — ABNORMAL LOW (ref >40)
LDL Cholesterol: 64 mg/dL (ref 0–99)
Total CHOL/HDL Ratio: 4.6 RATIO
Triglycerides: 179 mg/dL — ABNORMAL HIGH (ref <150)
VLDL: 36 mg/dL (ref 0–40)

## 2022-03-25 LAB — RAPID URINE DRUG SCREEN, HOSP PERFORMED
Amphetamines: NOT DETECTED
Barbiturates: NOT DETECTED
Benzodiazepines: POSITIVE — AB
Cocaine: NOT DETECTED
Opiates: NOT DETECTED
Tetrahydrocannabinol: NOT DETECTED

## 2022-03-25 LAB — CBC WITH DIFFERENTIAL/PLATELET
Abs Immature Granulocytes: 0.04 10*3/uL (ref 0.00–0.07)
Basophils Absolute: 0 10*3/uL (ref 0.0–0.1)
Basophils Relative: 0 %
Eosinophils Absolute: 0 10*3/uL (ref 0.0–0.5)
Eosinophils Relative: 0 %
HCT: 37.6 % — ABNORMAL LOW (ref 39.0–52.0)
Hemoglobin: 13.7 g/dL (ref 13.0–17.0)
Immature Granulocytes: 0 %
Lymphocytes Relative: 13 %
Lymphs Abs: 1.3 10*3/uL (ref 0.7–4.0)
MCH: 32.6 pg (ref 26.0–34.0)
MCHC: 36.4 g/dL — ABNORMAL HIGH (ref 30.0–36.0)
MCV: 89.5 fL (ref 80.0–100.0)
Monocytes Absolute: 0.7 10*3/uL (ref 0.1–1.0)
Monocytes Relative: 7 %
Neutro Abs: 8.2 10*3/uL — ABNORMAL HIGH (ref 1.7–7.7)
Neutrophils Relative %: 80 %
Platelets: 203 10*3/uL (ref 150–400)
RBC: 4.2 MIL/uL — ABNORMAL LOW (ref 4.22–5.81)
RDW: 12.5 % (ref 11.5–15.5)
WBC: 10.3 10*3/uL (ref 4.0–10.5)
nRBC: 0 % (ref 0.0–0.2)

## 2022-03-25 LAB — BASIC METABOLIC PANEL
Anion gap: 13 (ref 5–15)
BUN: 21 mg/dL (ref 8–23)
CO2: 23 mmol/L (ref 22–32)
Calcium: 8.4 mg/dL — ABNORMAL LOW (ref 8.9–10.3)
Chloride: 101 mmol/L (ref 98–111)
Creatinine, Ser: 1.66 mg/dL — ABNORMAL HIGH (ref 0.61–1.24)
GFR, Estimated: 42 mL/min — ABNORMAL LOW (ref >60)
Glucose, Bld: 213 mg/dL — ABNORMAL HIGH (ref 70–99)
Potassium: 3 mmol/L — ABNORMAL LOW (ref 3.5–5.1)
Sodium: 137 mmol/L (ref 135–145)

## 2022-03-25 LAB — PHOSPHORUS
Phosphorus: 2.6 mg/dL (ref 2.5–4.6)
Phosphorus: 2.2 mg/dL — ABNORMAL LOW (ref 2.5–4.6)

## 2022-03-25 LAB — MAGNESIUM
Magnesium: 1.8 mg/dL (ref 1.7–2.4)
Magnesium: 2.3 mg/dL (ref 1.7–2.4)

## 2022-03-25 MED ORDER — LABETALOL HCL 5 MG/ML IV SOLN
10.0000 mg | INTRAVENOUS | Status: DC | PRN
  Administered 2022-03-26 (×2): 10 mg via INTRAVENOUS
  Filled 2022-03-25: qty 4

## 2022-03-25 MED ORDER — CHLORHEXIDINE GLUCONATE CLOTH 2 % EX PADS
6.0000 | MEDICATED_PAD | Freq: Every day | CUTANEOUS | Status: DC
  Administered 2022-03-26 – 2022-03-30 (×6): 6 via TOPICAL

## 2022-03-25 MED ORDER — VITAL HIGH PROTEIN PO LIQD
1000.0000 mL | ORAL | Status: DC
  Administered 2022-03-25 (×2): 1000 mL

## 2022-03-25 MED ORDER — POTASSIUM CHLORIDE 20 MEQ PO PACK
60.0000 meq | PACK | ORAL | Status: AC
  Administered 2022-03-25: 60 meq
  Filled 2022-03-25: qty 3

## 2022-03-25 MED ORDER — PROSOURCE TF20 ENFIT COMPATIBL EN LIQD
60.0000 mL | Freq: Every day | ENTERAL | Status: DC
  Administered 2022-03-25: 60 mL
  Filled 2022-03-25: qty 60

## 2022-03-25 MED ORDER — MAGNESIUM SULFATE 2 GM/50ML IV SOLN
2.0000 g | Freq: Once | INTRAVENOUS | Status: AC
  Administered 2022-03-25: 2 g via INTRAVENOUS
  Filled 2022-03-25: qty 50

## 2022-03-25 MED ORDER — SODIUM CHLORIDE 0.9 % IV SOLN: INTRAVENOUS | Status: DC | PRN

## 2022-03-25 MED ORDER — LACTATED RINGERS IV BOLUS
500.0000 mL | Freq: Once | INTRAVENOUS | Status: AC
  Administered 2022-03-25: 500 mL via INTRAVENOUS

## 2022-03-25 MED ORDER — POTASSIUM CHLORIDE 20 MEQ PO PACK
60.0000 meq | PACK | Freq: Two times a day (BID) | ORAL | Status: DC
  Administered 2022-03-25: 60 meq
  Filled 2022-03-25: qty 3

## 2022-03-25 MED ORDER — HYDRALAZINE HCL 20 MG/ML IJ SOLN
10.0000 mg | Freq: Four times a day (QID) | INTRAMUSCULAR | Status: DC | PRN
  Administered 2022-03-26 (×2): 10 mg via INTRAVENOUS
  Filled 2022-03-25 (×2): qty 1

## 2022-03-25 NOTE — Progress Notes (Signed)
OT Cancellation Note  Patient Details Name: Shane Jones MRN: [id number] DOB: [birth year]   Cancelled Treatment:    Reason Eval/Treat Not Completed: Patient not medically ready;Medical issues which prohibited therapy (Pt on vent, Levophed, and sedated. Per RN and NP, will hold OT today.)  Elliot Cousin 03/25/2022, 9:53 AM

## 2022-03-25 NOTE — Progress Notes (Signed)
eLink Physician-Brief Progress Note Patient Name: Shane Jones DOB: 07-Feb-1945 MRN: [id number]   Date of Service  03/25/2022  HPI/Events of Note  Patient on levophed drip at 15 mcg/min.  Only has PIV.  Peripheral access good at this time.  Low potassium  eICU Interventions  Will need central access when ground team able.  May need to be addressed by daytime team. Will replace potassium enterally     Intervention Category Major Interventions: Hypotension - evaluation and management  Mauri Brooklyn, P 03/25/2022, 5:16 AM

## 2022-03-25 NOTE — Progress Notes (Signed)
NAME:  Shane Jones, MRN:  [id number], DOB:  1945/09/01, LOS: 1 ADMISSION DATE:  03/24/2022, CONSULTATION DATE:  03/24/22 REFERRING MD:  Genevie Cheshire, CHIEF COMPLAINT:  endotracheally intubated    History of Present Illness:  77 yo M PMH DM, OSA, HTN who had sudden onset AMS/bradycardia/vomiting while at Dr office for mgmgnt of hyperglycemia. EMS called in this setting, noted pt to have leftward gaze + R hemiplegia prompting code stroke activation and ER presentation. CTA with L P1 occlusion and R P2 severe stenosis.  Unclear LKN-- sounds like hasn't been doing well for a few days-- but did go to NIR for arteriogram. Unable to access L PCA however due to tortuosity at aortic arch, L Subclav, and L vertebral art.   Remains intubated after case due to pre-procedural vomiting.   PCCM consulted in thsi setting   Pertinent  Medical History  DM, HTN, OSA   Significant Hospital Events: Including procedures, antibiotic start and stop dates in addition to other pertinent events   L PCA occlusion. NIR -- unsuccessful intervention. Remains intubated 3/23 MRI with large acute left PCA infarct with associated mild petechial blood products in the left occipital lobe without mass effect   Interim History / Subjective:  Remains intubated. This AM on low dose fentanyl gtt. Awakens with verbal stimulation. Family at bedside   Objective   Blood pressure (!) 169/96, pulse (!) 57, temperature 98.7 F (37.1 C), temperature source Axillary, resp. rate (!) 24, height 5\' 5"  (1.651 m), weight 76.2 kg, SpO2 99 %.    Vent Mode: PRVC FiO2 (%):  [30 %-50 %] 40 % Set Rate:  [18 bmp] 18 bmp Vt Set:  [490 mL] 490 mL PEEP:  [5 cmH20] 5 cmH20 Plateau Pressure:  [13 cmH20-17 cmH20] 15 cmH20   Intake/Output Summary (Last 24 hours) at 03/25/2022 [id number] Last data filed at 03/25/2022 0800 Gross per 24 hour  Intake 2816.61 ml  Output 4300 ml  Net -1483.39 ml   Filed Weights   03/24/22 1229  Weight: 76.2 kg     Examination: General: Critically ill adult male, lying in bed, on vent  Neuro: Sedated, opens eyes with verbal stimulation, withdrawals on left hemibody, right lower triple flex, right upper extension  HEENT: ETT/OG in place  Cardiovascular: Brady, HR 52, no mRG Lungs: Coarse breath sounds, vent assisted breaths  Abdomen: soft, non-distended, active bowel sounds  Musculoskeletal: -edema  Skin: Intact, warm, no rashes. Left groin site soft, no hematoma    Assessment & Plan:   L PCA CVA - L PCA occlusion with R P2 severe stenosis- attempted neuro IR thrombectomy but unfortunately not successful due to due to tortuosity at aortic arch, L Subclav, and L vertebral art.  - outside window for TPN - MRI with large acute left PCA infarct with associated mild petechial blood products in the left occipital lobe without mass effect Plan  - Neuro following, primary stroke management per them. - ASA/Plavix/Statin. - ECHO pending  - PT/OT/ST when able.  Respiratory insufficiency with multiple episodes emesis on ED arrival - s/p intubation for attempted neuro IR intervention and left intubated given emesis. Concern for aspiration 2/2 above. Hx OSA - non-compliant with CPAP. Plan - Full vent support. - Plan for SBT with goal extubation this AM  - VAP bundle  - Continue Unasyn empirically. - Bronchial hygiene. - Tracheal asp pending   Hypotension - presumed medication induced + possible aspiration/shock. HTN, HLD  Plan - Cardiac Monitoring - Titrate  Levophed for Systolic goal [id number] (per Neurology) > will talk about adjusting today.  - Continue home Atorvastatin, ASA, Zetia. - Hold home Furosemide, Losartan, Toprol-XL. - ECHO with bubble pending   Hypokalemia. Acute kidney Injury, some component of chronic, last Crt 1.58 (01/2022) Plan - Trend BMP - Mag and K replacing now   DM2 poorly controlled. - Hemoglobin AIC 11.1  Plan - SSI. - Hold home Dulaglutide, Empagliflozin,  Insulin.  Hx Gout. - Hold home Allopurinol.  Best Practice (right click and "Reselect all SmartList Selections" daily)   Diet/type: NPO. If not extubated  DVT prophylaxis: prophylactic heparin  GI prophylaxis: PPI Lines: N/A Foley:  Yes, and it is still needed Code Status:  full code Last date of multidisciplinary goals of care discussion: None yet.  Labs   CBC: Recent Labs  Lab 03/24/22 1226 03/24/22 1235 03/24/22 1600 03/25/22 0339  WBC 8.3  --   --  10.3  NEUTROABS 4.0  --   --  8.2*  HGB 14.9 14.3 12.6* 13.7  HCT 41.6 42.0 37.0* 37.6*  MCV 89.5  --   --  89.5  PLT 184  --   --  123456    Basic Metabolic Panel: Recent Labs  Lab 03/24/22 1226 03/24/22 1235 03/24/22 1600 03/24/22 1731 03/25/22 0339  NA 135 139 139 135 137  K 2.7* 2.8* 3.3* 3.4* 3.0*  CL 101 99  --  104 101  CO2 25  --   --  22 23  GLUCOSE 291* 292*  --  232* 213*  BUN 29* 31*  --  25* 21  CREATININE 1.97* 1.90*  --  1.55* 1.66*  CALCIUM 8.6*  --   --  8.5* 8.4*  MG  --   --   --   --  1.8  PHOS  --   --   --   --  2.6   GFR: Estimated Creatinine Clearance: 34.4 mL/min (A) (by C-G formula based on SCr of 1.66 mg/dL (H)). Recent Labs  Lab 03/24/22 1226 03/24/22 1731 03/24/22 1950 03/25/22 0339  PROCALCITON  --  <0.10  --   --   WBC 8.3  --   --  10.3  LATICACIDVEN  --  1.6 1.6  --     Liver Function Tests: Recent Labs  Lab 03/24/22 1226  AST 23  ALT 14  ALKPHOS 64  BILITOT 0.6  PROT 6.0*  ALBUMIN 3.4*   No results for input(s): "LIPASE", "AMYLASE" in the last 168 hours. No results for input(s): "AMMONIA" in the last 168 hours.  ABG    Component Value Date/Time   PHART 7.436 03/24/2022 1600   PCO2ART 32.6 03/24/2022 1600   PO2ART 189 (H) 03/24/2022 1600   HCO3 22.7 03/24/2022 1600   TCO2 24 03/24/2022 1600   ACIDBASEDEF 2.0 03/24/2022 1600   O2SAT 100 03/24/2022 1600     Coagulation Profile: Recent Labs  Lab 03/24/22 1226  INR 1.0    Cardiac Enzymes: No  results for input(s): "CKTOTAL", "CKMB", "CKMBINDEX", "TROPONINI" in the last 168 hours.  HbA1C: Hemoglobin A1C  Date/Time Value Ref Range Status  03/24/2022 12:13 PM 11.1 (A) 4.0 - 5.6 % Final   Hgb A1c MFr Bld  Date/Time Value Ref Range Status  08/09/2021 02:00 PM 8.5 (H) 4.6 - 6.5 % Final    Comment:    Glycemic Control Guidelines for People with Diabetes:Non Diabetic:  <6%Goal of Therapy: <7%Additional Action Suggested:  >8%   02/02/2021 04:43 PM 8.9 (  H) 4.6 - 6.5 % Final    Comment:    Glycemic Control Guidelines for People with Diabetes:Non Diabetic:  <6%Goal of Therapy: <7%Additional Action Suggested:  >8%     CBG: Recent Labs  Lab 03/24/22 1820 03/24/22 1938 03/24/22 2320 03/25/22 0323 03/25/22 0748  GLUCAP 231* 216* 268* 212* 175*    Review of Systems:   Unable to obtain as pt is sedated/intubated.   Home Medications  Prior to Admission medications   Medication Sig Start Date End Date Taking? Authorizing Provider  allopurinol (ZYLOPRIM) 100 MG tablet Take 1 tablet (100 mg total) by mouth daily. 01/27/22   Leone Haven, MD  amLODipine (NORVASC) 10 MG tablet Take 1 tablet (10 mg total) by mouth daily. 01/27/22   Leone Haven, MD  aspirin EC 81 MG tablet Take 81 mg by mouth daily. Swallow whole.    [provider]  atorvastatin (LIPITOR) 80 MG tablet Take 1 tablet (80 mg total) by mouth daily. 01/27/22   Leone Haven, MD  blood glucose meter kit and supplies Dispense based on patient and insurance preference. Use up to four times daily as directed. (FOR ICD-10 E10.9, E11.9). 03/11/18   Leone Haven, MD  Continuous Blood Gluc Sensor (FREESTYLE LIBRE 2 SENSOR) MISC Use to check glucose continuously 12/20/21   Kaylyn Layer T, RPH-CPP  Dulaglutide (TRULICITY) 4.5 0000000 SOPN Inject 4.5 mg into the skin once a week. 11/07/21   Leone Haven, MD  empagliflozin (JARDIANCE) 25 MG TABS tablet Take 1 tablet (25 mg total) by mouth daily.  11/07/21   Leone Haven, MD  escitalopram (LEXAPRO) 20 MG tablet TAKE 1 TABLET (20 MG) BY MOUTH DAILY 11/23/21   Leone Haven, MD  ezetimibe (ZETIA) 10 MG tablet Take 1 tablet (10 mg total) by mouth daily. 11/23/21   Leone Haven, MD  fluticasone (FLONASE) 50 MCG/ACT nasal spray Place 2 sprays into both nostrils daily. 04/11/21   Leone Haven, MD  furosemide (LASIX) 20 MG tablet Take 1 tablet (20 mg total) by mouth daily. 01/27/22   Leone Haven, MD  gabapentin (NEURONTIN) 400 MG capsule Take 1 capsule (400 mg total) by mouth 3 (three) times daily as needed. 01/27/22   Leone Haven, MD  Insulin Glargine [facility]) 100 UNIT/ML Inject 30 Units into the skin daily.    [provider]  Insulin Pen Needle (PEN NEEDLES) 32G X 4 MM MISC Use to inject insulin up to 4 times daily 12/29/19   Leone Haven, MD  losartan (COZAAR) 100 MG tablet Take 1 tablet (100 mg total) by mouth daily. 01/27/22   Leone Haven, MD  metoprolol succinate (TOPROL-XL) 100 MG 24 hr tablet Take 1 tablet (100 mg total) by mouth daily. 01/27/22   Leone Haven, MD  pantoprazole (PROTONIX) 40 MG tablet Take 1 tablet (40 mg total) by mouth daily. 01/27/22   Leone Haven, MD     Critical care time: 35 min.   CRITICAL CARE Performed by: Omar Person   Total critical care time: 35 minutes  Critical care time was exclusive of separately billable procedures and treating other patients.  Critical care was necessary to treat or prevent imminent or life-threatening deterioration.  Critical care was time spent personally by me on the following activities: development of treatment plan with patient and/or surrogate as well as nursing, discussions with consultants, evaluation of patient's response to treatment, examination of patient, obtaining history from  patient or surrogate, ordering and performing treatments and interventions, ordering and review of laboratory  studies, ordering and review of radiographic studies, pulse oximetry and re-evaluation of patient's condition. a  Hayden Pedro, AGACNP-BC [facility] [facility]  PCCM Pgr: [phone]

## 2022-03-25 NOTE — Progress Notes (Signed)
SLP Cancellation Note  Patient Details Name: Shane Jones MRN: [id number] DOB: [birth year]   Cancelled treatment:       Reason Eval/Treat Not Completed: Medical issues which prohibited therapy;Other (comment) (Patient on vent. SLP will follow for readiness)  Sonia Baller, MA, CCC-SLP Speech Therapy

## 2022-03-25 NOTE — Progress Notes (Addendum)
STROKE TEAM PROGRESS NOTE   INTERVAL HISTORY His family is at the bedside.  He is on the ventilator no sedation.  Eyes are closed, easily arousable, left side is purposeful and localizes to pain right side is hemiplegic Patient has been unable to wean due to apnea and unable to be extubated He is on Levophed at 12 mcg MRI scan of the brain shows a large complete left PCA territory distribution infarct with mild petechial blood products but no significant mass effect.  Mechanical thrombectomy was attempted but could not be performed due to technical difficulties Vitals:   03/25/22 1513 03/25/22 1515 03/25/22 1530 03/25/22 1545  BP:      Pulse:  (!) 52 (!) 52 (!) 50  Resp:  16 14 18   Temp: (!) 97.4 F (36.3 C)     TempSrc: Axillary     SpO2:  98% 98% 98%  Weight:      Height:       CBC:  Recent Labs  Lab 03/24/22 1226 03/24/22 1235 03/24/22 1600 03/25/22 0339  WBC 8.3  --   --  10.3  NEUTROABS 4.0  --   --  8.2*  HGB 14.9   < > 12.6* 13.7  HCT 41.6   < > 37.0* 37.6*  MCV 89.5  --   --  89.5  PLT 184  --   --  203   < > = values in this interval not displayed.   Basic Metabolic Panel:  Recent Labs  Lab 03/24/22 1731 03/25/22 0339  NA 135 137  K 3.4* 3.0*  CL 104 101  CO2 22 23  GLUCOSE 232* 213*  BUN 25* 21  CREATININE 1.55* 1.66*  CALCIUM 8.5* 8.4*  MG  --  1.8  PHOS  --  2.6   Lipid Panel:  Recent Labs  Lab 03/25/22 0339  CHOL 128  TRIG 179*  HDL 28*  CHOLHDL 4.6  VLDL 36  LDLCALC 64   HgbA1c:  Recent Labs  Lab 03/24/22 1213  HGBA1C 11.1*   Urine Drug Screen:  Recent Labs  Lab 03/25/22 1224  LABOPIA NONE DETECTED  COCAINSCRNUR NONE DETECTED  LABBENZ POSITIVE*  AMPHETMU NONE DETECTED  THCU NONE DETECTED  LABBARB NONE DETECTED    Alcohol Level  Recent Labs  Lab 03/24/22 1522  ETH <10    IMAGING past 24 hours DG Chest Port 1 View  Result Date: 03/25/2022 CLINICAL DATA:  [id number] with ventilator dependent respiratory failure. EXAM:  PORTABLE CHEST 1 VIEW COMPARISON:  Portable chest yesterday at 4:30 p.m. FINDINGS: 5:05 a.m. ETT tip is 3.4 cm from the carina. NGT tip curves to the left with tip in the proximal stomach. The cardiac size is normal. There is overlying monitor wiring. The aorta is tortuous with moderate calcification, stable mediastinum. No pleural effusion is seen. The lungs are hypoinflated but radiographically clear, as visualized. Thoracic cage is intact. IMPRESSION: 1. ETT tip 3.4 cm from the carina. 2. NGT tip in the proximal stomach. 3. No interval change. Hypoinflated with no appreciable acute findings. Electronically Signed   By: Telford Nab M.D.   On: 03/25/2022 06:54   MR BRAIN WO CONTRAST  Result Date: 03/25/2022 CLINICAL DATA:  Follow-up examination for stroke. EXAM: MRI HEAD WITHOUT CONTRAST TECHNIQUE: Multiplanar, multiecho pulse sequences of the brain and surrounding structures were obtained without intravenous contrast. COMPARISON:  Prior studies from 03/24/2022. FINDINGS: Brain: Cerebral volume within normal limits for age. Scattered patchy T2/FLAIR hyperintensity involving the periventricular and  deep white matter both cerebral hemispheres, consistent with chronic small vessel ischemic disease, mild for age. Confluent restricted diffusion involving the left temporal occipital region, consistent with a large evolving left PCA distribution infarct. Involvement of the left thalamus and thalamocapsular region. No significant mass effect. Associated mild petechial blood products noted at the left occipital lobe (series 12, image 25) Heidelberg classification 1a: HI1, scattered small petechiae, no mass effect. No other evidence for acute or subacute ischemia. Gray-white matter differentiation maintained. No other areas of chronic cortical infarction. No other acute or chronic intracranial blood products. No mass lesion, midline shift or mass effect. Mild ventricular prominence related global parenchymal volume  loss without hydrocephalus. No extra-axial fluid collection. Pituitary gland and suprasellar region within normal limits. Vascular: Major intracranial vascular flow voids are maintained. Skull and upper cervical spine: Craniocervical junction within normal limits. Degenerative spondylosis noted within the upper cervical spine at C3-4 without high-grade spinal stenosis. Bone marrow signal intensity overall within normal limits. No scalp soft tissue abnormality. Sinuses/Orbits: Prior bilateral ocular lens replacement. Scattered mucosal thickening noted throughout the paranasal sinuses. No air-fluid levels to suggest acute sinusitis. No mastoid effusion. Other: None. IMPRESSION: 1. Large acute left PCA distribution infarct. Associated mild petechial blood products at the left occipital lobe without significant mass effect. Heidelberg classification 1a: HI1, scattered small petechiae, no mass effect. 2. No other acute intracranial abnormality. 3. Underlying mild chronic microvascular ischemic disease. Electronically Signed   By: Jeannine Boga M.D.   On: 03/25/2022 03:39   DG CHEST PORT 1 VIEW  Result Date: 03/24/2022 CLINICAL DATA:  Endotracheal tube placement EXAM: PORTABLE CHEST 1 VIEW COMPARISON:  08/25/2010 FINDINGS: Tip of endotracheal tube is 2 cm above the carina. NG tube is noted traversing the esophagus with its tip in the stomach. Side-port in NG tube is not clearly visualized. Cardiac size is within normal limits. There are linear densities in left lower lung field. There are no signs of alveolar pulmonary edema or focal pulmonary consolidation. There is no pleural effusion or pneumothorax. IMPRESSION: Tip of endotracheal tube is 2 cm above the carina. It could be pulled back 1-2 cm. Small linear densities in left lower lung field may suggest minimal scarring or subsegmental atelectasis. There are no signs of pulmonary edema or focal pulmonary consolidation. Electronically Signed   By: Elmer Picker M.D.   On: 03/24/2022 16:50   DG Abd Portable 1V  Result Date: 03/24/2022 CLINICAL DATA:  Orogastric tube placement. EXAM: PORTABLE ABDOMEN - 1 VIEW COMPARISON:  None Available. FINDINGS: Distal tip of enteric tube is seen in expected position of proximal stomach. No abnormal bowel dilatation. IMPRESSION: Distal tip of enteric tube is seen in expected position of proximal stomach. Electronically Signed   By: Marijo Conception M.D.   On: 03/24/2022 16:33    PHYSICAL EXAM  Temp:  [94 F (34.4 C)-98.9 F (37.2 C)] 97.4 F (36.3 C) (03/23 1513) Pulse Rate:  [49-73] 50 (03/23 1545) Resp:  [11-24] 18 (03/23 1545) BP: (116-169)/(70-101) 116/72 (03/23 1200) SpO2:  [97 %-100 %] 98 % (03/23 1545) Arterial Line BP: (107-199)/(61-109) 135/67 (03/23 1545) FiO2 (%):  [30 %-40 %] 30 % (03/23 1407)  General critically ill on ventilator with no sedation Cardiovascular - Regular rhythm and rate.  Mental Status -  Eyes are closed.  Opens eyes to voice and noxious stimuli.  He will intermittently follow commands  Cranial Nerves II - XII - II - Visual field intact blinks to threat bilaterally.  III, IV, VI - Extraocular movements intact. V - Facial sensation intact bilaterally. VII -unable to assess VIII - Hearing & vestibular intact bilaterally. X - Palate elevates symmetrically. XI - Chin turning & shoulder shrug intact bilaterally. XII - Tongue protrusion intact.  Motor Strength -left side is purposeful.  Right side is hemiplegic Motor Tone - Muscle tone was assessed at the neck and appendages and was normal. Sensory -decreased sensation on the  right  Coordination -unable to assess  Gait and Station - deferred.  ASSESSMENT/PLAN Shane Jones is a 77 y.o. male with history of uncontrolled diabetes, sleep apnea, hypertension who had not been doing well for the past few days and was taken to his primary care's office for management of high blood sugars, was noted to have a sudden  onset of bradycardia, unresponsiveness and vomiting at the doctor's office. EMS was called and they noted that he has a leftward gaze preference as well as right hemiplegia, and activated a code stroke and brought him to the ER.   Stroke: Large acute left PCA infarct Etiology: Large vessel disease versus cardiogenic embolism  code Stroke CT head No acute abnormality. ASPECTS 10.    CTA head & neck left P1 patient in severe stenosis of the right P2 Cerebral angio noted left posterior cerebral artery unsuccessful MRI large left acute PCA infarct with mild petechial blood products in left occipital lobe 2D Echo pending LDL 64 HgbA1c 11.1 VTE prophylaxis -heparin subcu    Diet   Diet NPO time specified   81 mg prior to admission, now on aspirin 81 mg and Plavix 75 mg.  Therapy recommendations: Pending Disposition: Pending   Hypertension Home meds: Norvasc 10, losartan 100 mg, metoprolol 100 mg Stable Was on Levophed @ 12 mcg, can D/c  BP goal less than 180 Long-term BP goal normotensive  Hyperlipidemia Home meds: Zetia 10 mg, atorvastatin 80 mg, resumed in hospital LDL 64, goal < 70 Continue statin at discharge  Acute hypoxic respiratory failure due to stroke Possible aspiration pneumonia Management per CCM Antibiotics per CCM  Dysphagia OG tube placed Consider starting tube feeds if patient not able to be extubated  Diabetes type II UnControlled Home meds: Trulicity, Jardiance 25 mg HgbA1c 11.1, goal < 7.0 CBGs Recent Labs    03/25/22 1132 03/25/22 1252 03/25/22 1512  GLUCAP 193* 180* 119*    SSI Will need diabetes management with PCP after discharge  Other Stroke Risk Factors Advanced Age >/= 34  Cigarette smoker advised to stop smoking Coronary artery disease Obstructive sleep apnea, on CPAP at home Congestive heart failure  Other Active Problems Acute kidney injury-creatinine 1.66 friend Gout Hypokalemia K3.0 replaced will check in the a.m.  Hospital  day # 1  Beulah Gandy DNP, ACNPC-AG  [facility]  STROKE MD NOTE :  I have personally obtained history,examined this patient, reviewed notes, independently viewed imaging studies, participated in medical decision making and plan of care.ROS completed by me personally and pertinent positives fully documented  I have made any additions or clarifications directly to the above note. Agree with note above.  Patient presented with left gaze preference and right hemiplegia outside the time window for thrombolysis but possibly within 24 hours and was taken for emergent mechanical thrombectomy for left PCA occlusion but unfortunately due to technical difficulties mechanical thrombectomy could not be successfully performed.  MRI scan shows complete left PCA territory infarction.  Patient remains on ventilatory support recommend stopping sedation and wean off ventilator  as tolerated.  Permissive hypertension and wean off vasopressor support.  Discussed with son and daughter at bedside.  Questions.  Discussed with Dr. Leory Plowman [facility] critical care medicine. This patient is critically ill and at significant risk of neurological worsening, death and care requires constant monitoring of vital signs, hemodynamics,respiratory and cardiac monitoring, extensive review of multiple databases, frequent neurological assessment, discussion with family, other specialists and medical decision making of high complexity.I have made any additions or clarifications directly to the above note.This critical care time does not reflect procedure time, or teaching time or supervisory time of PA/NP/Med Resident etc but could involve care discussion time.  I spent 32 minutes of neurocritical care time  in the care of  this patient.     Antony Contras, MD Medical Director [facility] Pager: [phone] 03/25/2022 4:29 PM   To contact Stroke Continuity provider, please refer to [email or web address] After hours, contact  General Neurology

## 2022-03-25 NOTE — Progress Notes (Signed)
Brief Nutrition Note  Consult received for enteral/tube feeding initiation and management.  Adult Enteral Nutrition Protocol initiated. Full assessment to follow.  OG tube in place with tip located in proximal stomach per xray imaging. Question location of side port. Recommend advancement to ensure side port is gastric and order new xray to confirm. Discussed with RN.    Admitting Dx: Acute ischemic stroke ([place]) [I63.9] Cerebrovascular accident (CVA), unspecified mechanism ([place]) [I63.9] Occlusion and stenosis of left posterior cerebral artery [I66.22]  Body mass index is 27.96 kg/m. Pt meets criteria for overweight based on current BMI.  Labs:  Recent Labs  Lab 03/24/22 1226 03/24/22 1235 03/24/22 1600 03/24/22 1731 03/25/22 0339 03/25/22 1535  NA 135 139 139 135 137  --   K 2.7* 2.8* 3.3* 3.4* 3.0*  --   CL 101 99  --  104 101  --   CO2 25  --   --  22 23  --   BUN 29* 31*  --  25* 21  --   CREATININE 1.97* 1.90*  --  1.55* 1.66*  --   CALCIUM 8.6*  --   --  8.5* 8.4*  --   MG  --   --   --   --  1.8 2.3  PHOS  --   --   --   --  2.6 2.2*  GLUCOSE 291* 292*  --  232* 213*  --     [place] Dana, RDN, LDN Clinical Nutrition

## 2022-03-25 NOTE — Progress Notes (Addendum)
Patient remains off continuous sedation since early this AM, alert, follows occasional commands. Has failed PS three separate times today due to apnea, bradypnea. Will start tube feeds, D/C fluids, and try again at a later time.   BP has improved, off levophed, goal systolic is to allow permissive HTN up to [id number] systolic, PRN Hydralazine/labetalol placed if patient were to be above 200.

## 2022-03-25 NOTE — Progress Notes (Signed)
PT Cancellation Note  Patient Details Name: Shane Jones MRN: [id number] DOB: 06-Dec-1945   Cancelled Treatment:    Reason Eval/Treat Not Completed: Medical issues which prohibited therapy  Pt on vent, Levophed, and sedated. Spoke with RN and NP, will hold PT today. Abran Richard, PT [facility] [phone]  Karlton Lemon 03/25/2022, 9:46 AM

## 2022-03-25 NOTE — Progress Notes (Signed)
Referring Physician(s): Dr Bethann Humble  Supervising Physician: Luanne Bras  Patient Status:  [facility] - In-pt  Chief Complaint:  CVA; Left PCA occlusion  Subjective:  Dr Estanislado Pandy note 3/22: Multiple attempts to access the left posterior cerebral artery unsuccessful due to significant tortuosity at the aortic arch,  at the origin of the left subclavian artery, and tortuosity throughout the dominant left vertebral artery.   Intubated still--- for extubation per CCM Family at bedside They feel like he has more movement and tries to communicate  MRI today this am:  IMPRESSION: 1. Large acute left PCA distribution infarct. Associated mild petechial blood products at the left occipital lobe without significant mass effect. Heidelberg classification 1a: HI1, scattered small petechiae, no mass effect. 2. No other acute intracranial abnormality. 3. Underlying mild chronic microvascular ischemic disease.   Allergies: Uloric [febuxostat]  Medications: Prior to Admission medications   Medication Sig Start Date End Date Taking? Authorizing Provider  allopurinol (ZYLOPRIM) 100 MG tablet Take 1 tablet (100 mg total) by mouth daily. 01/27/22   Leone Haven, MD  amLODipine (NORVASC) 10 MG tablet Take 1 tablet (10 mg total) by mouth daily. 01/27/22   Leone Haven, MD  aspirin EC 81 MG tablet Take 81 mg by mouth daily. Swallow whole.    [provider]  atorvastatin (LIPITOR) 80 MG tablet Take 1 tablet (80 mg total) by mouth daily. 01/27/22   Leone Haven, MD  blood glucose meter kit and supplies Dispense based on patient and insurance preference. Use up to four times daily as directed. (FOR ICD-10 E10.9, E11.9). 03/11/18   Leone Haven, MD  Continuous Blood Gluc Sensor (FREESTYLE LIBRE 2 SENSOR) MISC Use to check glucose continuously 12/20/21   Kaylyn Layer T, RPH-CPP  Dulaglutide (TRULICITY) 4.5 0000000 SOPN Inject 4.5 mg into the skin once a week.  11/07/21   Leone Haven, MD  empagliflozin (JARDIANCE) 25 MG TABS tablet Take 1 tablet (25 mg total) by mouth daily. 11/07/21   Leone Haven, MD  escitalopram (LEXAPRO) 20 MG tablet TAKE 1 TABLET (20 MG) BY MOUTH DAILY 11/23/21   Leone Haven, MD  ezetimibe (ZETIA) 10 MG tablet Take 1 tablet (10 mg total) by mouth daily. 11/23/21   Leone Haven, MD  fluticasone (FLONASE) 50 MCG/ACT nasal spray Place 2 sprays into both nostrils daily. 04/11/21   Leone Haven, MD  furosemide (LASIX) 20 MG tablet Take 1 tablet (20 mg total) by mouth daily. 01/27/22   Leone Haven, MD  gabapentin (NEURONTIN) 400 MG capsule Take 1 capsule (400 mg total) by mouth 3 (three) times daily as needed. 01/27/22   Leone Haven, MD  Insulin Glargine [facility]) 100 UNIT/ML Inject 30 Units into the skin daily.    [provider]  Insulin Pen Needle (PEN NEEDLES) 32G X 4 MM MISC Use to inject insulin up to 4 times daily 12/29/19   Leone Haven, MD  losartan (COZAAR) 100 MG tablet Take 1 tablet (100 mg total) by mouth daily. 01/27/22   Leone Haven, MD  metoprolol succinate (TOPROL-XL) 100 MG 24 hr tablet Take 1 tablet (100 mg total) by mouth daily. 01/27/22   Leone Haven, MD  pantoprazole (PROTONIX) 40 MG tablet Take 1 tablet (40 mg total) by mouth daily. 01/27/22   Leone Haven, MD     Vital Signs: BP (!) 169/96   Pulse (!) 56   Temp 98.7 F (37.1  C) (Axillary)   Resp 18   Ht 5\' 5"  (1.651 m)   Wt 167 lb 15.9 oz (76.2 kg)   SpO2 99%   BMI 27.96 kg/m   Physical Exam Musculoskeletal:     Comments: Moves Left to pain Rt leg moves spontaneously No movements to command Mittens on both hands---- some movement noted for both   Skin:    Comments: Rt groin site is clean and dry NT no bleeding  No hematoma Rt foot good pulses  Neurological:     Comments: Opens eyes to name      Imaging: DG Chest Port 1 View  Result Date:  03/25/2022 CLINICAL DATA:  [id number] with ventilator dependent respiratory failure. EXAM: PORTABLE CHEST 1 VIEW COMPARISON:  Portable chest yesterday at 4:30 p.m. FINDINGS: 5:05 a.m. ETT tip is 3.4 cm from the carina. NGT tip curves to the left with tip in the proximal stomach. The cardiac size is normal. There is overlying monitor wiring. The aorta is tortuous with moderate calcification, stable mediastinum. No pleural effusion is seen. The lungs are hypoinflated but radiographically clear, as visualized. Thoracic cage is intact. IMPRESSION: 1. ETT tip 3.4 cm from the carina. 2. NGT tip in the proximal stomach. 3. No interval change. Hypoinflated with no appreciable acute findings. Electronically Signed   By: Telford Nab M.D.   On: 03/25/2022 06:54   MR BRAIN WO CONTRAST  Result Date: 03/25/2022 CLINICAL DATA:  Follow-up examination for stroke. EXAM: MRI HEAD WITHOUT CONTRAST TECHNIQUE: Multiplanar, multiecho pulse sequences of the brain and surrounding structures were obtained without intravenous contrast. COMPARISON:  Prior studies from 03/24/2022. FINDINGS: Brain: Cerebral volume within normal limits for age. Scattered patchy T2/FLAIR hyperintensity involving the periventricular and deep white matter both cerebral hemispheres, consistent with chronic small vessel ischemic disease, mild for age. Confluent restricted diffusion involving the left temporal occipital region, consistent with a large evolving left PCA distribution infarct. Involvement of the left thalamus and thalamocapsular region. No significant mass effect. Associated mild petechial blood products noted at the left occipital lobe (series 12, image 25) Heidelberg classification 1a: HI1, scattered small petechiae, no mass effect. No other evidence for acute or subacute ischemia. Gray-white matter differentiation maintained. No other areas of chronic cortical infarction. No other acute or chronic intracranial blood products. No mass lesion, midline  shift or mass effect. Mild ventricular prominence related global parenchymal volume loss without hydrocephalus. No extra-axial fluid collection. Pituitary gland and suprasellar region within normal limits. Vascular: Major intracranial vascular flow voids are maintained. Skull and upper cervical spine: Craniocervical junction within normal limits. Degenerative spondylosis noted within the upper cervical spine at C3-4 without high-grade spinal stenosis. Bone marrow signal intensity overall within normal limits. No scalp soft tissue abnormality. Sinuses/Orbits: Prior bilateral ocular lens replacement. Scattered mucosal thickening noted throughout the paranasal sinuses. No air-fluid levels to suggest acute sinusitis. No mastoid effusion. Other: None. IMPRESSION: 1. Large acute left PCA distribution infarct. Associated mild petechial blood products at the left occipital lobe without significant mass effect. Heidelberg classification 1a: HI1, scattered small petechiae, no mass effect. 2. No other acute intracranial abnormality. 3. Underlying mild chronic microvascular ischemic disease. Electronically Signed   By: Jeannine Boga M.D.   On: 03/25/2022 03:39   DG CHEST PORT 1 VIEW  Result Date: 03/24/2022 CLINICAL DATA:  Endotracheal tube placement EXAM: PORTABLE CHEST 1 VIEW COMPARISON:  08/25/2010 FINDINGS: Tip of endotracheal tube is 2 cm above the carina. NG tube is noted traversing the esophagus with its  tip in the stomach. Side-port in NG tube is not clearly visualized. Cardiac size is within normal limits. There are linear densities in left lower lung field. There are no signs of alveolar pulmonary edema or focal pulmonary consolidation. There is no pleural effusion or pneumothorax. IMPRESSION: Tip of endotracheal tube is 2 cm above the carina. It could be pulled back 1-2 cm. Small linear densities in left lower lung field may suggest minimal scarring or subsegmental atelectasis. There are no signs of  pulmonary edema or focal pulmonary consolidation. Electronically Signed   By: Elmer Picker M.D.   On: 03/24/2022 16:50   DG Abd Portable 1V  Result Date: 03/24/2022 CLINICAL DATA:  Orogastric tube placement. EXAM: PORTABLE ABDOMEN - 1 VIEW COMPARISON:  None Available. FINDINGS: Distal tip of enteric tube is seen in expected position of proximal stomach. No abnormal bowel dilatation. IMPRESSION: Distal tip of enteric tube is seen in expected position of proximal stomach. Electronically Signed   By: Marijo Conception M.D.   On: 03/24/2022 16:33   CT ANGIO HEAD NECK W WO CM  Result Date: 03/24/2022 CLINICAL DATA:  Stroke code, right-sided weakness EXAM: CT ANGIOGRAPHY HEAD AND NECK TECHNIQUE: Multidetector CT imaging of the head and neck was performed using the standard protocol during bolus administration of intravenous contrast. Multiplanar CT image reconstructions and MIPs were obtained to evaluate the vascular anatomy. Carotid stenosis measurements (when applicable) are obtained utilizing NASCET criteria, using the distal internal carotid diameter as the denominator. RADIATION DOSE REDUCTION: This exam was performed according to the departmental dose-optimization program which includes automated exposure control, adjustment of the mA and/or kV according to patient size and/or use of iterative reconstruction technique. CONTRAST:  23mL OMNIPAQUE IOHEXOL 350 MG/ML SOLN COMPARISON:  No prior CTA available, correlation is made with CT head 03/24/2022 FINDINGS: CT HEAD FINDINGS For noncontrast findings, please see same day CT head. CTA NECK FINDINGS Aortic arch: Two-vessel arch with a common origin of the brachiocephalic and left common carotid arteries. Imaged portion shows no evidence of aneurysm or dissection. No significant stenosis of the major arch vessel origins. Aortic atherosclerosis. Right carotid system: No evidence of dissection, occlusion, or hemodynamically significant stenosis (greater than  50%). Left carotid system: No evidence of dissection, occlusion, or hemodynamically significant stenosis (greater than 50%). Medially directed outpouching from the mid left ICA (series 7, image 159 and series 8, image 112), possibly a pseudoaneurysm Vertebral arteries: Mild stenosis of the origin of the bilateral vertebral arteries. The vertebral arteries are otherwise patent to the skull base, without significant stenosis, dissection, or occlusion. Skeleton: No acute osseous abnormality. Severe degenerative changes in the cervical spine, with severe disc height loss and partial fusion across C3-C4 and C5-C7. Edentulous. Other neck: Negative. Upper chest: No focal pulmonary opacity or pleural effusion. Review of the MIP images confirms the above findings CTA HEAD FINDINGS Anterior circulation: Both internal carotid arteries are patent to the termini, without significant stenosis. A1 segments patent. Normal anterior communicating artery. Anterior cerebral arteries are patent to their distal aspects. No M1 stenosis or occlusion. MCA branches perfused and symmetric. Posterior circulation: Vertebral arteries patent to the vertebrobasilar junction without stenosis. Suspect a small fenestration in the proximal basilar artery (series 7, image 118). Basilar patent to its distal aspect. Mild dilatation of the basilar about the origin of the bilateral superior cerebellar arteries. 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 The right superior cerebellar artery  is patent proximally. Occlusion of the left PCA just distal to the left P1 takeoff (series 7, image 80 7-88), without evidence of reconstitution. Patent right P1. Severe stenosis proximal and mid right P2 (series 7, image 96). The remainder of the right PCA is patent to its distal aspects PCAs perfused to their distal aspects without stenosis. The bilateral posterior communicating arteries are not  visualized. Venous sinuses: As permitted by contrast timing, patent. Anatomic variants: None significant. Review of the MIP images confirms the above findings IMPRESSION: 1. Occlusion of the left PCA just distal to the left P1 takeoff, without evidence of reconstitution. 2. 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 3. Severe stenosis in the proximal and mid right P2. 4. No hemodynamically significant stenosis in the neck. 5. Medially directed outpouching from the mid left ICA, possibly a pseudoaneurysm. 6. Aortic atherosclerosis. Aortic Atherosclerosis (ICD10-I70.0). Impression #1 was communicated on 03/24/2022 at 12:58 pm to provider Dr. Rory Percy via secure text paging. Electronically Signed   By: Merilyn Baba M.D.   On: 03/24/2022 13:21   CT HEAD CODE STROKE WO CONTRAST  Result Date: 03/24/2022 CLINICAL DATA:  Code stroke. Neuro deficit, acute, stroke suspected. Right-sided weakness. EXAM: CT HEAD WITHOUT CONTRAST TECHNIQUE: Contiguous axial images were obtained from the base of the skull through the vertex without intravenous contrast. RADIATION DOSE REDUCTION: This exam was performed according to the departmental dose-optimization program which includes automated exposure control, adjustment of the mA and/or kV according to patient size and/or use of iterative reconstruction technique. COMPARISON:  Head CT 08/24/2010. FINDINGS: Brain: Motion degraded study. No acute intracranial hemorrhage. Moderate chronic small-vessel disease. Cortical gray-white differentiation is preserved. No hydrocephalus or extra-axial collection. No mass effect or midline shift. Vascular: No hyperdense vessel or unexpected calcification. Skull: No calvarial fracture or suspicious bone lesion. Skull base is unremarkable. Sinuses/Orbits: Unremarkable. Other: None. ASPECTS [facility] Stroke Program Early CT Score) - Ganglionic level infarction (caudate, lentiform nuclei, internal capsule, insula, M1-M3 cortex): 7  - Supraganglionic infarction (M4-M6 cortex): 3 Total score (0-10 with 10 being normal): 10 IMPRESSION: No acute intracranial hemorrhage.  ASPECT score is 10. Code stroke imaging results were communicated on 03/24/2022 at 12:45 pm to provider Dr. Rory Percy via secure text paging. Electronically Signed   By: Emmit Alexanders M.D.   On: 03/24/2022 12:45    Labs:  CBC: Recent Labs    08/09/21 1400 12/15/21 1354 03/24/22 1226 03/24/22 1235 03/24/22 1600 03/25/22 0339  WBC 8.4 8.0 8.3  --   --  10.3  HGB 15.5 16.5 14.9 14.3 12.6* 13.7  HCT 44.5 44.1 41.6 42.0 37.0* 37.6*  PLT 166.0 178 184  --   --  203    COAGS: Recent Labs    03/24/22 1226  INR 1.0  APTT 24    BMP: Recent Labs    12/15/21 1354 01/27/22 1054 03/24/22 1226 03/24/22 1235 03/24/22 1600 03/24/22 1731 03/25/22 0339  NA 133* 141 135 139 139 135 137  K 3.4* 3.9 2.7* 2.8* 3.3* 3.4* 3.0*  CL 102 99 101 99  --  104 101  CO2 24 30 25   --   --  22 23  GLUCOSE 316* 198* 291* 292*  --  232* 213*  BUN 32* 28* 29* 31*  --  25* 21  CALCIUM 9.0 10.1 8.6*  --   --  8.5* 8.4*  CREATININE 1.74* 1.58* 1.97* 1.90*  --  1.55* 1.66*  GFRNONAA 40*  --  34*  --   --  45* 42*    LIVER FUNCTION TESTS: Recent Labs    01/27/22 1054 03/24/22 1226  BILITOT 0.5 0.6  AST 14 23  ALT 10 14  ALKPHOS 64 64  PROT 6.9 6.0*  ALBUMIN 4.2 3.4*    Assessment and Plan:  Left PCA occlusion No IR procedure performed yesterday secondary tortuosity of vessels Plan per Neuro and CCM   Electronically Signed: Lavonia Drafts, PA-C 03/25/2022, 10:19 AM   I spent a total of 15 Minutes at the the patient's bedside AND on the patient's hospital floor or unit, greater than 50% of which was counseling/coordinating care for L PCA occlusion

## 2022-03-25 NOTE — Progress Notes (Signed)
Pt transported from 3M12 to MRI and back by RN and RT w/o complications

## 2022-03-25 NOTE — Progress Notes (Signed)
eLink Physician-Brief Progress Note Patient Name: Shane Jones DOB: Jul 28, 1945 MRN: [id number]   Date of Service  03/25/2022  HPI/Events of Note  Increasing vasopressor need.  eICU Interventions  Fluid bolus ordered     Intervention Category Major Interventions: Hypotension - evaluation and management  Mauri Brooklyn, P 03/25/2022, 3:30 AM

## 2022-03-26 DIAGNOSIS — I639 Cerebral infarction, unspecified: Secondary | ICD-10-CM | POA: Diagnosis not present

## 2022-03-26 LAB — BASIC METABOLIC PANEL
Anion gap: 8 (ref 5–15)
BUN: 23 mg/dL (ref 8–23)
CO2: 22 mmol/L (ref 22–32)
Calcium: 8.4 mg/dL — ABNORMAL LOW (ref 8.9–10.3)
Chloride: 112 mmol/L — ABNORMAL HIGH (ref 98–111)
Creatinine, Ser: 1.79 mg/dL — ABNORMAL HIGH (ref 0.61–1.24)
GFR, Estimated: 38 mL/min — ABNORMAL LOW (ref >60)
Glucose, Bld: 195 mg/dL — ABNORMAL HIGH (ref 70–99)
Potassium: 4 mmol/L (ref 3.5–5.1)
Sodium: 142 mmol/L (ref 135–145)

## 2022-03-26 LAB — POCT I-STAT 7, (LYTES, BLD GAS, ICA,H+H)
Acid-base deficit: 1 mmol/L (ref 0.0–2.0)
Bicarbonate: 25 mmol/L (ref 20.0–28.0)
Calcium, Ion: 1.28 mmol/L (ref 1.15–1.40)
HCT: 39 % (ref 39.0–52.0)
Hemoglobin: 13.3 g/dL (ref 13.0–17.0)
O2 Saturation: 97 %
Patient temperature: 98.8
Potassium: 3.9 mmol/L (ref 3.5–5.1)
Sodium: 146 mmol/L — ABNORMAL HIGH (ref 135–145)
TCO2: 26 mmol/L (ref 22–32)
pCO2 arterial: 45.6 mmHg (ref 32–48)
pH, Arterial: 7.346 — ABNORMAL LOW (ref 7.35–7.45)
pO2, Arterial: 93 mmHg (ref 83–108)

## 2022-03-26 LAB — GLUCOSE, CAPILLARY
Glucose-Capillary: 188 mg/dL — ABNORMAL HIGH (ref 70–99)
Glucose-Capillary: 174 mg/dL — ABNORMAL HIGH (ref 70–99)
Glucose-Capillary: 151 mg/dL — ABNORMAL HIGH (ref 70–99)
Glucose-Capillary: 143 mg/dL — ABNORMAL HIGH (ref 70–99)
Glucose-Capillary: 124 mg/dL — ABNORMAL HIGH (ref 70–99)
Glucose-Capillary: 191 mg/dL — ABNORMAL HIGH (ref 70–99)

## 2022-03-26 LAB — MAGNESIUM: Magnesium: 2.4 mg/dL (ref 1.7–2.4)

## 2022-03-26 LAB — PHOSPHORUS: Phosphorus: 2.6 mg/dL (ref 2.5–4.6)

## 2022-03-26 MED ORDER — ORAL CARE MOUTH RINSE: 15.0000 mL | OROMUCOSAL | Status: DC | PRN

## 2022-03-26 MED ORDER — DEXMEDETOMIDINE HCL IN NACL 400 MCG/100ML IV SOLN
0.0000 ug/kg/h | INTRAVENOUS | Status: DC
  Administered 2022-03-26: 0.2 ug/kg/h via INTRAVENOUS
  Administered 2022-03-27: 0.4 ug/kg/h via INTRAVENOUS
  Filled 2022-03-26 (×2): qty 100

## 2022-03-26 MED ORDER — METOPROLOL TARTRATE 50 MG PO TABS
50.0000 mg | ORAL_TABLET | Freq: Two times a day (BID) | ORAL | Status: DC
  Administered 2022-03-27 – 2022-04-01 (×10): 50 mg
  Filled 2022-03-26 (×10): qty 1

## 2022-03-26 MED ORDER — AMLODIPINE BESYLATE 5 MG PO TABS
5.0000 mg | ORAL_TABLET | Freq: Every day | ORAL | Status: DC
  Administered 2022-03-27 – 2022-04-01 (×5): 5 mg
  Filled 2022-03-26 (×5): qty 1

## 2022-03-26 MED ORDER — ORAL CARE MOUTH RINSE
15.0000 mL | OROMUCOSAL | Status: DC
  Administered 2022-03-26 (×9): 15 mL via OROMUCOSAL

## 2022-03-26 MED ORDER — METOPROLOL TARTRATE 50 MG PO TABS: 50.0000 mg | ORAL_TABLET | Freq: Two times a day (BID) | ORAL | Status: DC

## 2022-03-26 NOTE — Progress Notes (Signed)
PT Cancellation Note  Patient Details Name: Shane Jones MRN: [id number] DOB: 02-14-1945   Cancelled Treatment:    Reason Eval/Treat Not Completed: Medical issues which prohibited therapy;Patient not medically ready (RN reports pt remains unable to participate in PT due to not being medically ready (BP elevated, vented, restless). Will follow up at later date/time as schedule allows.)   [place], DPT Acute Rehabilitation Services Office [phone]  03/26/22 9:00 AM

## 2022-03-26 NOTE — Progress Notes (Signed)
Pts SBP above parameters and MD made aware. PRN meds used. W/ some relief; however pt remains above. Will continue to check SBP via art line per MD orders

## 2022-03-26 NOTE — Progress Notes (Signed)
NAME:  DEANTHONY DIZE, MRN:  [id number], DOB:  04-19-1945, LOS: 2 ADMISSION DATE:  03/24/2022, CONSULTATION DATE:  03/24/22 REFERRING MD:  Genevie Cheshire, CHIEF COMPLAINT:  endotracheally intubated    History of Present Illness:  77 yo M PMH DM, OSA, HTN who had sudden onset AMS/bradycardia/vomiting while at Dr office for mgmgnt of hyperglycemia. EMS called in this setting, noted pt to have leftward gaze + R hemiplegia prompting code stroke activation and ER presentation. CTA with L P1 occlusion and R P2 severe stenosis.  Unclear LKN-- sounds like hasn't been doing well for a few days-- but did go to NIR for arteriogram. Unable to access L PCA however due to tortuosity at aortic arch, L Subclav, and L vertebral art.   Remains intubated after case due to pre-procedural vomiting.   PCCM consulted in thsi setting   Pertinent  Medical History  DM, HTN, OSA   Significant Hospital Events: Including procedures, antibiotic start and stop dates in addition to other pertinent events   L PCA occlusion. NIR -- unsuccessful intervention. Remains intubated 3/23 MRI with large acute left PCA infarct with associated mild petechial blood products in the left occipital lobe without mass effect   Interim History / Subjective:  No events, wakes up. Still no resp effort on SBT.  Objective   Blood pressure (!) 190/120, pulse 77, temperature 99.4 F (37.4 C), temperature source Axillary, resp. rate 14, height 5\' 5"  (1.651 m), weight 76.2 kg, SpO2 99 %.    Vent Mode: PRVC FiO2 (%):  [30 %] 30 % Set Rate:  [10 bmp-18 bmp] 10 bmp Vt Set:  [490 mL] 490 mL PEEP:  [5 cmH20] 5 cmH20 Plateau Pressure:  [14 cmH20-16 cmH20] 14 cmH20   Intake/Output Summary (Last 24 hours) at 03/26/2022 0849 Last data filed at 03/26/2022 0700 Gross per 24 hour  Intake 1487.03 ml  Output 1675 ml  Net -187.97 ml    Filed Weights   03/24/22 1229  Weight: 76.2 kg    Examination: No distress Moves all 4 ext to command RASS  0 Abd soft, +BS L groin soft Lungs clear Minimal secretions  Cr up but around her baseline CBC stable  Assessment & Plan:   L PCA CVA - L PCA occlusion with R P2 severe stenosis- attempted neuro IR thrombectomy but unfortunately not successful due to due to tortuosity at aortic arch, L Subclav, and L vertebral art.  - outside window for TPN - MRI with large acute left PCA infarct with associated mild petechial blood products in the left occipital lobe without mass effect Plan  - Neuro following, primary stroke management per them. - ASA/Plavix/Statin. - f/u echo - PT/OT/ST when able.  Respiratory insufficiency with multiple episodes emesis on ED arrival - s/p intubation for attempted neuro IR intervention and left intubated given emesis. Concern for aspiration 2/2 above. Hx OSA - non-compliant with CPAP. Plan - DC abx, follow fever curve - Extubation with NIV PRN - Swallow screen etc - If recurrent N/V will have to reintubate, no issues over past day  Hypotension - presumed medication induced + possible aspiration/shock. HTN, HLD  Plan - Continue home Atorvastatin, ASA, Zetia. - ECHO with bubble pending  - Resume home BB, amlodipine  Hypokalemia. Acute kidney Injury, some component of chronic, last Crt 1.58 (01/2022) Plan - BMP check tomorrow  DM2 poorly controlled. - Hemoglobin AIC 11.1  Plan - SSI. - Hold home Dulaglutide, Empagliflozin, Insulin.  Hx Gout. - Hold home  Allopurinol.  Best Practice (right click and "Reselect all SmartList Selections" daily)   Diet/type: Pending swallow eval DVT prophylaxis: prophylactic heparin  GI prophylaxis: PPI Lines: N/A Foley:  remove Code Status:  full code Last date of multidisciplinary goals of care discussion: None yet.  32 min cc time Erskine Emery MD PCCM

## 2022-03-26 NOTE — Progress Notes (Signed)
AM Bps are reading high from agitation. PRN meds used and SBP back w/in the order set

## 2022-03-26 NOTE — Progress Notes (Signed)
Art line is not correlating w/ manual BP check. Art line is more positional.

## 2022-03-26 NOTE — Progress Notes (Signed)
Spoke we MD and RX about pts status and unable to swallow. Held meds per verbal from MD. Monday plan for cortrack

## 2022-03-26 NOTE — Progress Notes (Addendum)
eLink Physician-Brief Progress Note Patient Name: Shane Jones DOB: [birth year] MRN: [id number]   Date of Service  03/26/2022  HPI/Events of Note  Received request to renew left wrist restraint.  Pt with large PCA infarct.   Pt is restless in bed, moving around, banging his left arm against the rails, putting his legs up in the air.   Pt is still NPO at this time.   eICU Interventions  Ok to renew wrist restraint.  Start precedex gtt.      Intervention Category Intermediate Interventions: Other:  Elsie Lincoln 03/26/2022, 9:03 PM

## 2022-03-26 NOTE — Procedures (Signed)
Extubation Procedure Note  Patient Details:   Name: Shane Jones DOB: 25-Nov-1945 MRN: [id number]   Airway Documentation:    Vent end date: 03/26/22 Vent end time: 0905   Evaluation  O2 sats: stable throughout Complications: No apparent complications Patient did tolerate procedure well. Bilateral Breath Sounds: Clear, Rhonchi   No, pt could not speak post extubation.  Pt extubated to BiPAP per physician order.  Earney Navy 03/26/2022, 9:06 AM

## 2022-03-26 NOTE — Progress Notes (Addendum)
STROKE TEAM PROGRESS NOTE   INTERVAL HISTORY No family at the bedside.  RN at the bedside Patient has been extubated this morning and is currently on BiPAP Neurological exam i is unchanged from yesterday.  Blood pressure adequately controlled.  Vital signs stable. Vitals:   03/26/22 0336 03/26/22 0700 03/26/22 0749 03/26/22 0800  BP:      Pulse: 83 (!) 59  77  Resp: 19 18  14   Temp:   99.4 F (37.4 C)   TempSrc:   Axillary   SpO2: 96% 98%  99%  Weight:      Height:       CBC:  Recent Labs  Lab 03/24/22 1226 03/24/22 1235 03/24/22 1600 03/25/22 0339  WBC 8.3  --   --  10.3  NEUTROABS 4.0  --   --  8.2*  HGB 14.9   < > 12.6* 13.7  HCT 41.6   < > 37.0* 37.6*  MCV 89.5  --   --  89.5  PLT 184  --   --  203   < > = values in this interval not displayed.    Basic Metabolic Panel:  Recent Labs  Lab 03/25/22 0339 03/25/22 1535 03/26/22 0406  NA 137  --  142  K 3.0*  --  4.0  CL 101  --  112*  CO2 23  --  22  GLUCOSE 213*  --  195*  BUN 21  --  23  CREATININE 1.66*  --  1.79*  CALCIUM 8.4*  --  8.4*  MG 1.8 2.3 2.4  PHOS 2.6 2.2* 2.6    Lipid Panel:  Recent Labs  Lab 03/25/22 0339  CHOL 128  TRIG 179*  HDL 28*  CHOLHDL 4.6  VLDL 36  LDLCALC 64    HgbA1c:  Recent Labs  Lab 03/24/22 1213  HGBA1C 11.1*    Urine Drug Screen:  Recent Labs  Lab 03/25/22 1224  LABOPIA NONE DETECTED  COCAINSCRNUR NONE DETECTED  LABBENZ POSITIVE*  AMPHETMU NONE DETECTED  THCU NONE DETECTED  LABBARB NONE DETECTED     Alcohol Level  Recent Labs  Lab 03/24/22 1522  ETH <10     IMAGING past 24 hours DG CHEST PORT 1 VIEW  Result Date: 03/25/2022 CLINICAL DATA:  Orogastric tube placement EXAM: PORTABLE CHEST 1 VIEW COMPARISON:  Portable exam 1736 hours compared to 03/25/2022 FINDINGS: Orogastric tube coiled in proximal stomach. Endotracheal tube unchanged. Normal heart size, mediastinal contours, and pulmonary vascularity. Atherosclerotic calcification aorta.  Lungs clear. No pleural effusion or pneumothorax. IMPRESSION: Orogastric tube coiled in proximal stomach. Aortic Atherosclerosis (ICD10-I70.0). Electronically Signed   By: Lavonia Dana M.D.   On: 03/25/2022 18:02    PHYSICAL EXAM  Temp:  [97.4 F (36.3 C)-100.2 F (37.9 C)] 99.4 F (37.4 C) (03/24 0749) Pulse Rate:  [50-83] 77 (03/24 0800) Resp:  [11-21] 14 (03/24 0800) BP: (116-139)/(72-87) 139/82 (03/24 0000) SpO2:  [96 %-100 %] 99 % (03/24 0800) Arterial Line BP: (107-178)/(61-90) 178/90 (03/24 0800) FiO2 (%):  [30 %] 30 % (03/24 0725)  General critically ill on ventilator with no sedation Cardiovascular -on BiPAP  Mental Status -  Eyes are closed.  Opens eyes to voice and noxious stimuli.  He will intermittently follow commands  Cranial Nerves II - XII - II - Visual field intact blinks to threat bilaterally.  But more on the left III, IV, VI -slight left gaze preference, eyes do not cross all the way to the right V -  Facial sensation intact bilaterally. VII -unable to assess VIII - Hearing & vestibular intact bilaterally. X - Palate elevates symmetrically. XI - Chin turning & shoulder shrug intact bilaterally. XII - Tongue protrusion intact.  Motor Strength -left side is purposeful.  Right side is hemiplegic Motor Tone - Muscle tone was assessed at the neck and appendages and was normal. Sensory -decreased sensation on the  right  Coordination -unable to assess  Gait and Station - deferred.  ASSESSMENT/PLAN Mr. STEPEHN MCMANIGAL is a 77 y.o. male with history of uncontrolled diabetes, sleep apnea, hypertension who had not been doing well for the past few days and was taken to his primary care's office for management of high blood sugars, was noted to have a sudden onset of bradycardia, unresponsiveness and vomiting at the doctor's office. EMS was called and they noted that he has a leftward gaze preference as well as right hemiplegia, and activated a code stroke and brought him  to the ER.   Stroke: Large acute left PCA infarct Etiology: Large vessel disease versus cardiogenic embolism  code Stroke CT head No acute abnormality. ASPECTS 10.    CTA head & neck left P1 patient in severe stenosis of the right P2 Cerebral angio noted left posterior cerebral artery unsuccessful MRI large left acute PCA infarct with mild petechial blood products in left occipital lobe 2D Echo pending LDL 64 HgbA1c 11.1 VTE prophylaxis -heparin subcu    Diet   Diet NPO time specified   81 mg prior to admission, now on aspirin 81 mg and Plavix 75 mg.  Therapy recommendations: Pending Disposition: Pending   Hypertension Home meds: Norvasc 10, losartan 100 mg, metoprolol 100 mg Stable Was on Levophed @ 12 mcg, can D/c  BP goal less than 180 Long-term BP goal normotensive  Hyperlipidemia Home meds: Zetia 10 mg, atorvastatin 80 mg, resumed in hospital LDL 64, goal < 70 Continue statin at discharge  Acute hypoxic respiratory failure due to stroke, extubated Possible aspiration pneumonia Management per CCM Antibiotics per CCM Currently on BiPAP  Dysphagia Consider starting tube feeds if patient not able to be extubated Will need core track for meds and feeds  Diabetes type II UnControlled Home meds: Trulicity, Jardiance 25 mg HgbA1c 11.1, goal < 7.0 CBGs Recent Labs    03/25/22 2318 03/26/22 0319 03/26/22 0744  GLUCAP 175* 188* 174*     SSI Will need diabetes management with PCP after discharge  Other Stroke Risk Factors Advanced Age >/= 68  Cigarette smoker advised to stop smoking Coronary artery disease Obstructive sleep apnea, on CPAP at home Congestive heart failure  Other Active Problems Acute kidney injury-creatinine 1.66 -179 Gout Hypokalemia K 4 .0 replaced will check in the a.m.  Hospital day # 2  Beulah Gandy DNP, ACNPC-AG  [facility]  I have personally obtained history,examined this patient, reviewed notes, independently  viewed imaging studies, participated in medical decision making and plan of care.ROS completed by me personally and pertinent positives fully documented  I have made any additions or clarifications directly to the above note. Agree with note above.  Mobilize out of bed.  Therapy consults.  Check echocardiogram.  Transfer to neurology stepdown bed when available.  Discussed with Dr. Tamala Julian critical care medicine.  No family available at the bedside for discussion.This patient is critically ill and at significant risk of neurological worsening, death and care requires constant monitoring of vital signs, hemodynamics,respiratory and cardiac monitoring, extensive review of multiple databases, frequent neurological assessment, discussion  with family, other specialists and medical decision making of high complexity.I have made any additions or clarifications directly to the above note.This critical care time does not reflect procedure time, or teaching time or supervisory time of PA/NP/Med Resident etc but could involve care discussion time.  I spent 30 minutes of neurocritical care time  in the care of  this patient.      Antony Contras, MD Medical Director [facility] Pager: [phone] 03/26/2022 2:14 PM     To contact Stroke Continuity provider, please refer to [email or web address] After hours, contact General Neurology

## 2022-03-27 ENCOUNTER — Inpatient Hospital Stay (HOSPITAL_COMMUNITY): Payer: PPO

## 2022-03-27 ENCOUNTER — Other Ambulatory Visit (HOSPITAL_COMMUNITY): Payer: PPO

## 2022-03-27 DIAGNOSIS — E440 Moderate protein-calorie malnutrition: Secondary | ICD-10-CM | POA: Insufficient documentation

## 2022-03-27 DIAGNOSIS — I639 Cerebral infarction, unspecified: Secondary | ICD-10-CM | POA: Diagnosis not present

## 2022-03-27 DIAGNOSIS — I6389 Other cerebral infarction: Secondary | ICD-10-CM

## 2022-03-27 LAB — CBC
HCT: 37.8 % — ABNORMAL LOW (ref 39.0–52.0)
Hemoglobin: 12.9 g/dL — ABNORMAL LOW (ref 13.0–17.0)
MCH: 31.9 pg (ref 26.0–34.0)
MCHC: 34.1 g/dL (ref 30.0–36.0)
MCV: 93.6 fL (ref 80.0–100.0)
Platelets: 132 10*3/uL — ABNORMAL LOW (ref 150–400)
RBC: 4.04 MIL/uL — ABNORMAL LOW (ref 4.22–5.81)
RDW: 12.7 % (ref 11.5–15.5)
WBC: 6.4 10*3/uL (ref 4.0–10.5)
nRBC: 0 % (ref 0.0–0.2)

## 2022-03-27 LAB — ECHOCARDIOGRAM COMPLETE
Area-P 1/2: 2.39 cm2
Height: 65 in
Weight: 2744.29 oz

## 2022-03-27 LAB — MAGNESIUM: Magnesium: 2.3 mg/dL (ref 1.7–2.4)

## 2022-03-27 LAB — GLUCOSE, CAPILLARY
Glucose-Capillary: 188 mg/dL — ABNORMAL HIGH (ref 70–99)
Glucose-Capillary: 155 mg/dL — ABNORMAL HIGH (ref 70–99)
Glucose-Capillary: 189 mg/dL — ABNORMAL HIGH (ref 70–99)
Glucose-Capillary: 192 mg/dL — ABNORMAL HIGH (ref 70–99)
Glucose-Capillary: 275 mg/dL — ABNORMAL HIGH (ref 70–99)
Glucose-Capillary: 258 mg/dL — ABNORMAL HIGH (ref 70–99)
Glucose-Capillary: 247 mg/dL — ABNORMAL HIGH (ref 70–99)

## 2022-03-27 LAB — BASIC METABOLIC PANEL
Anion gap: 10 (ref 5–15)
BUN: 22 mg/dL (ref 8–23)
CO2: 24 mmol/L (ref 22–32)
Calcium: 8.8 mg/dL — ABNORMAL LOW (ref 8.9–10.3)
Chloride: 108 mmol/L (ref 98–111)
Creatinine, Ser: 1.69 mg/dL — ABNORMAL HIGH (ref 0.61–1.24)
GFR, Estimated: 41 mL/min — ABNORMAL LOW (ref >60)
Glucose, Bld: 161 mg/dL — ABNORMAL HIGH (ref 70–99)
Potassium: 4 mmol/L (ref 3.5–5.1)
Sodium: 142 mmol/L (ref 135–145)

## 2022-03-27 LAB — PHOSPHORUS: Phosphorus: 3.3 mg/dL (ref 2.5–4.6)

## 2022-03-27 MED ORDER — EZETIMIBE 10 MG PO TABS
10.0000 mg | ORAL_TABLET | Freq: Every day | ORAL | Status: DC
  Administered 2022-03-27 – 2022-04-01 (×6): 10 mg
  Filled 2022-03-27 (×6): qty 1

## 2022-03-27 MED ORDER — OSMOLITE 1.5 CAL PO LIQD
1000.0000 mL | ORAL | Status: DC
  Administered 2022-03-27 – 2022-03-30 (×3): 1000 mL
  Filled 2022-03-27: qty 1000

## 2022-03-27 MED ORDER — HYDRALAZINE HCL 20 MG/ML IJ SOLN
10.0000 mg | INTRAMUSCULAR | Status: DC | PRN
  Administered 2022-03-28: 10 mg via INTRAVENOUS
  Filled 2022-03-27: qty 1

## 2022-03-27 MED ORDER — ADULT MULTIVITAMIN W/MINERALS CH
1.0000 | ORAL_TABLET | Freq: Every day | ORAL | Status: DC
  Administered 2022-03-27 – 2022-04-01 (×6): 1
  Filled 2022-03-27 (×6): qty 1

## 2022-03-27 MED ORDER — GABAPENTIN 250 MG/5ML PO SOLN
300.0000 mg | Freq: Two times a day (BID) | ORAL | Status: DC
  Administered 2022-03-28 – 2022-03-30 (×5): 300 mg
  Filled 2022-03-27 (×6): qty 6

## 2022-03-27 MED ORDER — ASPIRIN 300 MG RE SUPP: 150.0000 mg | Freq: Every day | RECTAL | Status: DC

## 2022-03-27 MED ORDER — LABETALOL HCL 5 MG/ML IV SOLN: 20.0000 mg | INTRAVENOUS | Status: DC | PRN

## 2022-03-27 MED ORDER — ASPIRIN 81 MG PO CHEW
81.0000 mg | CHEWABLE_TABLET | Freq: Every day | ORAL | Status: DC
  Administered 2022-03-27 – 2022-04-01 (×6): 81 mg
  Filled 2022-03-27 (×6): qty 1

## 2022-03-27 MED ORDER — HALOPERIDOL LACTATE 5 MG/ML IJ SOLN
1.0000 mg | INTRAMUSCULAR | Status: DC | PRN
  Administered 2022-03-27: 1 mg via INTRAVENOUS
  Filled 2022-03-27: qty 1

## 2022-03-27 MED ORDER — PROSOURCE TF20 ENFIT COMPATIBL EN LIQD
60.0000 mL | Freq: Two times a day (BID) | ENTERAL | Status: DC
  Administered 2022-03-27 – 2022-03-30 (×7): 60 mL
  Filled 2022-03-27 (×7): qty 60

## 2022-03-27 MED ORDER — GABAPENTIN 250 MG/5ML PO SOLN
300.0000 mg | Freq: Two times a day (BID) | ORAL | Status: DC
  Administered 2022-03-27: 300 mg via ORAL
  Filled 2022-03-27: qty 6

## 2022-03-27 NOTE — Evaluation (Signed)
Clinical/Bedside Swallow Evaluation Patient Details  Name: Shane Jones MRN: [id number] Date of Birth: [birth year]  Today's Date: 03/27/2022 Time: SLP Start Time (ACUTE ONLY): 51 SLP Stop Time (ACUTE ONLY): 1143 SLP Time Calculation (min) (ACUTE ONLY): 28 min  Past Medical History:  Past Medical History:  Diagnosis Date   Diabetes mellitus without complication ([place])    Gout    Hypertension    Sleep apnea    has CPAP, doesn't use   Ulcer of esophagus without bleeding    Wears dentures    full upper and lower   Past Surgical History:  Past Surgical History:  Procedure Laterality Date   BALLOON DILATION N/A 10/22/2017   Procedure: BALLOON DILATION;  Surgeon: Lucilla Lame, MD;  Location: [place];  Service: Endoscopy;  Laterality: N/A;   CATARACT EXTRACTION W/PHACO Right 02/02/2022   Procedure: CATARACT EXTRACTION PHACO AND INTRAOCULAR LENS PLACEMENT ([facility]) RIGHT DIABETIC  6.48  00:45.6;  Surgeon: Norvel Richards, MD;  Location: [place];  Service: Ophthalmology;  Laterality: Right;  Diabetic   CATARACT EXTRACTION W/PHACO Left 02/16/2022   Procedure: CATARACT EXTRACTION PHACO AND INTRAOCULAR LENS PLACEMENT ([place]) LEFT DIABTIC  7.58  00:46.2;  Surgeon: Norvel Richards, MD;  Location: [place];  Service: Ophthalmology;  Laterality: Left;  Diabetic   ESOPHAGOGASTRODUODENOSCOPY (EGD) WITH PROPOFOL N/A 10/22/2017   Procedure: ESOPHAGOGASTRODUODENOSCOPY (EGD) WITH Biopsy;  Surgeon: Lucilla Lame, MD;  Location: [facility];  Service: Endoscopy;  Laterality: N/A;   RADIOLOGY WITH ANESTHESIA N/A 03/24/2022   Procedure: IR WITH ANESTHESIA;  Surgeon: Radiologist, Medication, MD;  Location: [facility];  Service: Radiology;  Laterality: N/A;   TONSILLECTOMY     HPI:  Shane Jones is a 77 y.o. male past medical history of uncontrolled diabetes, sleep apnea, hypertension who had not been doing well for the past few days and was taken to his primary care's  office for management of high blood sugars, was noted to have a sudden onset of bradycardia, unresponsiveness and vomiting at the doctor's office.  EMS was called and they noted that he has a leftward gaze preference as well as right hemiplegia, and activated a code stroke and brought him to the ER.MRI head revealed on 03/25/22  Large acute left PCA distribution infarct. Associated mild  petechial blood products at the left occipital lobe without  significant mass effect.  SLE/BSE generated to assess swallow and speech/language function.    Assessment / Plan / Recommendation  Clinical Impression  Pt seen for limited clinical swallow evaluation with cognitive-based oropharyngeal dysphagia noted with oral holding, prolonged bolus formation, delay in the initiation of the swallow and delayed cough/immediate cough noted with thin liquids given via occluding portion of a straw with initial sips appeared WFL, but subsequent swallows elicited an immediate/delayed cough.  Pt swatted at SLP during attempts to provide oral care and OME and during presentation of food/liquids, so limited amounts assessed.  Pt is not a candidate for a MBS at this time d/t impaired language/cognition and inability to follow 1-step commands.  Pt is also at risk for decreased nutrition/hydration d/t refusal of small amounts of puree/thin.  Solids were expelled from oral cavity volitionally by patient.  Pt's dentures were not available during assessment as he was edentulous which may have also impacted oral manipulation paired with cognitive impairment.  Recommend NPO with ice chips/water sips allowed after oral care and ST will f/u for PO readiness and completion of objective assessment when pt is ready.  Thank you for this consult. SLP Visit Diagnosis: Dysphagia, unspecified (R13.10)    Aspiration Risk  Mild aspiration risk;Moderate aspiration risk;Risk for inadequate nutrition/hydration    Diet Recommendation   NPO  Medication  Administration: Via alternative means    Other  Recommendations Oral Care Recommendations: Oral care QID;Oral care prior to ice chip/H20;Staff/trained caregiver to provide oral care    Recommendations for follow up therapy are one component of a multi-disciplinary discharge planning process, led by the attending physician.  Recommendations may be updated based on patient status, additional functional criteria and insurance authorization.  Follow up Recommendations Other (comment) (TBD)      Assistance Recommended at Discharge  FULL  Functional Status Assessment Patient has had a recent decline in their functional status and demonstrates the ability to make significant improvements in function in a reasonable and predictable amount of time.  Frequency and Duration min 2x/week  1 week       Prognosis Prognosis for improved oropharyngeal function: Fair Barriers to Reach Goals: Cognitive deficits      Swallow Study   General Date of Onset: 03/24/22 HPI: Shane Jones is a 77 y.o. male past medical history of uncontrolled diabetes, sleep apnea, hypertension who had not been doing well for the past few days and was taken to his primary care's office for management of high blood sugars, was noted to have a sudden onset of bradycardia, unresponsiveness and vomiting at the doctor's office.  EMS was called and they noted that he has a leftward gaze preference as well as right hemiplegia, and activated a code stroke and brought him to the ER.MRI head revealed on 03/25/22  Large acute left PCA distribution infarct. Associated mild  petechial blood products at the left occipital lobe without  significant mass effect.  SLE/BSE generated to assess swallow and speech/language function. Type of Study: Bedside Swallow Evaluation Previous Swallow Assessment: n/a Diet Prior to this Study: NPO Temperature Spikes Noted: No Respiratory Status: Room air History of Recent Intubation: Yes Total duration of  intubation (days): 3 days Date extubated: 03/26/22 Behavior/Cognition: Alert;Confused;Agitated;Impulsive;Requires cueing;Doesn't follow directions Oral Cavity Assessment: Other (comment) (DTA d/t pt refusal/swatting with mitts) Oral Care Completed by SLP: No Oral Cavity - Dentition: Edentulous Vision: Impaired for self-feeding Self-Feeding Abilities: Total assist Patient Positioning: Upright in bed Baseline Vocal Quality: Low vocal intensity Volitional Cough: Cognitively unable to elicit Volitional Swallow: Unable to elicit    Oral/Motor/Sensory Function Overall Oral Motor/Sensory Function: Generalized oral weakness   Ice Chips Ice chips: Not tested (pt refused)   Thin Liquid Thin Liquid: Impaired Presentation: Straw (occluded with small amounts given per presentation) Oral Phase Functional Implications: Oral holding Pharyngeal  Phase Impairments: Suspected delayed Swallow;Cough - Delayed    Nectar Thick Nectar Thick Liquid: Not tested   Honey Thick Honey Thick Liquid: Not tested   Puree Puree: Impaired Presentation: Spoon Oral Phase Impairments: Reduced lingual movement/coordination Oral Phase Functional Implications: Oral holding Pharyngeal Phase Impairments: Suspected delayed Swallow   Solid     Solid: Impaired Presentation: Spoon Oral Phase Impairments: Impaired mastication Oral Phase Functional Implications: Impaired mastication;Prolonged oral transit;Other (comment) (oral expulsion)      Pat Makayah Pauli,M.S., CCC-SLP 03/27/2022,1:05 PM

## 2022-03-27 NOTE — Progress Notes (Signed)
Echocardiogram 2D Echocardiogram has been performed.  Oneal Deputy Wyat Infinger RDCS 03/27/2022, 10:43 AM

## 2022-03-27 NOTE — Progress Notes (Signed)
Initial Nutrition Assessment  DOCUMENTATION CODES:   Non-severe (moderate) malnutrition in context of chronic illness  INTERVENTION:   Initiate tube feeds via Cortrak once placement confirmed with abdominal x-ray: - Start Osmolite 1.5 @ 40 ml/hr and advance by 10 ml q 6 hours to goal rate of 50 ml/hr (1200 ml/day) - PROSource TF20 60 ml BID  Tube feeding regimen at goal rate provides 1960 kcal, 115 grams of protein, and 914 ml of H2O.   - MVI with minerals daily per tube  NUTRITION DIAGNOSIS:   Moderate Malnutrition related to chronic illness (uncontrolled T2DM) as evidenced by moderate fat depletion, severe muscle depletion.  GOAL:   Patient will meet greater than or equal to 90% of their needs  MONITOR:   Diet advancement, Labs, Weight trends, TF tolerance  REASON FOR ASSESSMENT:   Consult, Ventilator Enteral/tube feeding initiation and management  ASSESSMENT:   77 year old male who presented to the ED on 3/22 as a code stroke. PMH of uncontrolled DM, sleep apnea, HTN. Pt admitted with large L PCA infarct.  03/22 - multiple episodes of vomiting, intubated, unsuccessful IR intervention 03/23 - TF protocol initiated 03/24 - extubated 03/25 - Cortrak placement pending  Discussed pt with RN and during ICU rounds. Consult received for enteral nutrition initiation and management. Pt is NPO at this time. Pt received TF protocol for brief period prior to extubation. Noted pt with multiple episodes of vomiting on admission; RN reports no vomiting today.  Unable to obtain diet and weight history at this time. Reviewed weight history in chart. Pt with a 3.3 kg weight loss since 05/16/21. This is a 4.1% weight loss in 10 months which is not clinically significant for timeframe but is concerning. Based on NFPE, pt meets criteria for moderate chronic malnutrition.  Admit weight: 76.2 kg Current weight: 77.8 kg  Medications reviewed and include: colace, SSI q 4 hours, IV protonix,  miralax  Labs reviewed: creatinine 1.69, platelets 132, hemoglobin A1C 11.1 CBG's: 124-191 x 24 hours  UOP: 1700 ml x 24 hours I/O's: -2.5 L since admit  NUTRITION - FOCUSED PHYSICAL EXAM:  Flowsheet Row Most Recent Value  Orbital Region Moderate depletion  Upper Arm Region Moderate depletion  Thoracic and Lumbar Region Severe depletion  Buccal Region Moderate depletion  Temple Region Moderate depletion  Clavicle Bone Region Severe depletion  Clavicle and Acromion Bone Region Severe depletion  Scapular Bone Region Moderate depletion  Dorsal Hand Unable to assess  Patellar Region Moderate depletion  Anterior Thigh Region Severe depletion  Posterior Calf Region Severe depletion  Edema (RD Assessment) None  Hair Reviewed  Eyes Reviewed  Mouth Reviewed  Skin Reviewed  Nails Reviewed       Diet Order:   Diet Order             Diet NPO time specified  Diet effective now                   EDUCATION NEEDS:   Not appropriate for education at this time  Skin:  Skin Assessment: Reviewed RN Assessment  Last BM:  no documented BM  Height:   Ht Readings from Last 1 Encounters:  03/24/22 5\' 5"  (1.651 m)    Weight:   Wt Readings from Last 1 Encounters:  03/27/22 77.8 kg    Ideal Body Weight:  61.8 kg  BMI:  Body mass index is 28.54 kg/m.  Estimated Nutritional Needs:   Kcal:  1800-2000  Protein:  90-110 grams  Fluid:  1.8-2.0 L    Gustavus Bryant, MS, RD, LDN Inpatient Clinical Dietitian Please see AM[facility] for contact information.

## 2022-03-27 NOTE — Progress Notes (Addendum)
NAME:  Shane Jones, MRN:  [id number], DOB:  [birth year], LOS: 3 ADMISSION DATE:  03/24/2022, CONSULTATION DATE:  03/24/22 REFERRING MD:  Genevie Cheshire, CHIEF COMPLAINT:  endotracheally intubated    History of Present Illness:  77 yo M PMH DM, OSA, HTN who had sudden onset AMS/bradycardia/vomiting while at Dr office for mgmgnt of hyperglycemia. EMS called in this setting, noted pt to have leftward gaze + R hemiplegia prompting code stroke activation and ER presentation. CTA with L P1 occlusion and R P2 severe stenosis.  Unclear LKN-- sounds like hasn't been doing well for a few days-- but did go to NIR for arteriogram. Unable to access L PCA however due to tortuosity at aortic arch, L Subclav, and L vertebral art.   Remains intubated after case due to pre-procedural vomiting.   PCCM consulted in thsi setting   Pertinent  Medical History  DM, HTN, OSA   Significant Hospital Events: Including procedures, antibiotic start and stop dates in addition to other pertinent events   L PCA occlusion. NIR -- unsuccessful intervention. Remains intubated 3/23 MRI with large acute left PCA infarct with associated mild petechial blood products in the left occipital lobe without mass effect   Interim History / Subjective:  Extubated to bipap. Agitation overnight started on precedex.  Objective   Blood pressure 137/77, pulse (!) 53, temperature 99.2 F (37.3 C), temperature source Axillary, resp. rate (!) 25, height 5\' 5"  (1.651 m), weight 77.8 kg, SpO2 92 %.    Vent Mode: BIPAP;PCV FiO2 (%):  [30 %-36 %] 36 % Set Rate:  [10 bmp-18 bmp] 10 bmp Vt Set:  [490 mL] 490 mL PEEP:  [5 cmH20] 5 cmH20 Plateau Pressure:  [14 cmH20] 14 cmH20   Intake/Output Summary (Last 24 hours) at 03/27/2022 [id number] Last data filed at 03/27/2022 0600 Gross per 24 hour  Intake 781.09 ml  Output 1700 ml  Net -918.91 ml   Filed Weights   03/24/22 1229 03/27/22 0500  Weight: 76.2 kg 77.8 kg    Examination: General  appearance: 77 y.o., male, chronically ill appearing Eyes: not tracking  HENT: NCAT; dry MM Lungs: CTAB, no crackles, no wheeze, with normal respiratory effort CV: brady RR, no murmur  Abdomen: Soft, non-tender; non-distended, BS present  Extremities: No peripheral edema, warm Neuro: does not follow commands, does not move RUE, RLE to noxious stim   Cr table CBC stable  Assessment & Plan:   L PCA CVA - L PCA occlusion with R P2 severe stenosis- attempted neuro IR thrombectomy but unfortunately not successful due to due to tortuosity at aortic arch, L Subclav, and L vertebral art.  - outside window for TPN - MRI with large acute left PCA infarct with associated mild petechial blood products in the left occipital lobe without mass effect Plan  - Neuro following, primary stroke management per them. - ASA/Plavix/Statin. - f/u echo - PT/OT/ST when able - reengage now that he's off bipap  # Acute metabolic encephalopathy - wean precedex as able   Respiratory insufficiency with multiple episodes emesis on ED arrival - s/p intubation for attempted neuro IR intervention and left intubated given emesis. Concern for aspiration 2/2 above. Hx OSA - non-compliant with CPAP. Plan - follow fever curve - Extubation with NIV PRN - Swallow screen etc - If recurrent N/V, trouble managing secretions, increased WOB would need to consider reintubation - must discuss with son and daughter as he would be high risk for tracheostomy should he require reintubation  Hypotension - presumed medication induced + possible aspiration/shock. HTN, HLD  Plan - Continue home Atorvastatin, ASA, Zetia. - ECHO with bubble pending  - Resume home BB, amlodipine pending SLP eval or when cortrak in place  Hypokalemia. Acute kidney Injury, some component of chronic, last Crt 1.58 (01/2022), resolving Plan - trend bmet - avoid nephrotoxins  DM2 poorly controlled. - Hemoglobin AIC 11.1  Plan - SSI. - Hold home  Dulaglutide, Empagliflozin, Insulin.  Hx Gout. - Hold home Allopurinol.  Best Practice (right click and "Reselect all SmartList Selections" daily)   Diet/type: Pending swallow eval DVT prophylaxis: prophylactic heparin  GI prophylaxis: PPI Lines: N/A - remove arterial line Foley:  remove Code Status:  full code Last date of multidisciplinary goals of care discussion: None yet - son and daughter are planning to come in to visit today  34 min cc time [place]

## 2022-03-27 NOTE — Progress Notes (Signed)
STROKE TEAM PROGRESS NOTE   INTERVAL HISTORY His wife is at the bedside.  RN at the bedside.  He is now on no supplemental oxygen.  Remains aphasic with right hemiplegia and neglect and left gaze deviation. Patient has been unable to swallow and speech therapy is doing bedside swallow eval Blood pressure adequately controlled.  Vital signs stable. Vitals:   03/27/22 1245 03/27/22 1300 03/27/22 1315 03/27/22 1330  BP:  (!) 207/107    Pulse: 79 90 91 88  Resp: (!) 24 (!) 24 (!) 27 (!) 22  Temp:      TempSrc:      SpO2: 93% 95% 94% 94%  Weight:      Height:       CBC:  Recent Labs  Lab 03/24/22 1226 03/24/22 1235 03/25/22 0339 03/26/22 1421 03/27/22 0556  WBC 8.3  --  10.3  --  6.4  NEUTROABS 4.0  --  8.2*  --   --   HGB 14.9   < > 13.7 13.3 12.9*  HCT 41.6   < > 37.6* 39.0 37.8*  MCV 89.5  --  89.5  --  93.6  PLT 184  --  203  --  132*   < > = values in this interval not displayed.   Basic Metabolic Panel:  Recent Labs  Lab 03/26/22 0406 03/26/22 1421 03/27/22 0556  NA 142 146* 142  K 4.0 3.9 4.0  CL 112*  --  108  CO2 22  --  24  GLUCOSE 195*  --  161*  BUN 23  --  22  CREATININE 1.79*  --  1.69*  CALCIUM 8.4*  --  8.8*  MG 2.4  --  2.3  PHOS 2.6  --  3.3   Lipid Panel:  Recent Labs  Lab 03/25/22 0339  CHOL 128  TRIG 179*  HDL 28*  CHOLHDL 4.6  VLDL 36  LDLCALC 64   HgbA1c:  Recent Labs  Lab 03/24/22 1213  HGBA1C 11.1*   Urine Drug Screen:  Recent Labs  Lab 03/25/22 1224  LABOPIA NONE DETECTED  COCAINSCRNUR NONE DETECTED  LABBENZ POSITIVE*  AMPHETMU NONE DETECTED  THCU NONE DETECTED  LABBARB NONE DETECTED    Alcohol Level  Recent Labs  Lab 03/24/22 1522  ETH <10    IMAGING past 24 hours ECHOCARDIOGRAM COMPLETE  Result Date: 03/27/2022    ECHOCARDIOGRAM REPORT   Patient Name:   Shane Jones Date of Exam: 03/27/2022 Medical Rec #:  [id number]    Height:       65.0 in Accession #:    [id number]   Weight:       171.5 lb Date of Birth:   Jul 16, 1945    BSA:          1.853 m Patient Age:    77 years     BP:           206/101 mmHg Patient Gender: M            HR:           71 bpm. Exam Location:  Inpatient Procedure: 2D Echo, Color Doppler and Cardiac Doppler Indications:    Stroke i63.9  History:        Patient has no prior history of Echocardiogram examinations.                 Risk Factors:Hypertension, Diabetes, Dyslipidemia and Sleep  Apnea.  Sonographer:    Raquel Sarna Senior RDCS Referring Phys: [id number] ASHISH ARORA  Sonographer Comments: Very poor windows due to lung interference, patient moving around during exam. IMPRESSIONS  1. Technically difficult study with limited visualization.  2. Left ventricular ejection fraction, by estimation, is 50 to 55%. The left ventricle has low normal function. The left ventricle has no regional wall motion abnormalities. Left ventricular diastolic parameters are consistent with Grade I diastolic dysfunction (impaired relaxation).  3. Right ventricular systolic function is normal. The right ventricular size is normal.  4. The mitral valve is normal in structure. No evidence of mitral valve regurgitation. No evidence of mitral stenosis.  5. The aortic valve was not well visualized. There is mild calcification of the aortic valve. There is mild thickening of the aortic valve. Aortic valve regurgitation is not visualized. No aortic stenosis is present.  6. The inferior vena cava is less than 1cm. FINDINGS  Left Ventricle: Left ventricular ejection fraction, by estimation, is 50 to 55%. The left ventricle has low normal function. The left ventricle has no regional wall motion abnormalities. The left ventricular internal cavity size was normal in size. There is no left ventricular hypertrophy. Left ventricular diastolic parameters are consistent with Grade I diastolic dysfunction (impaired relaxation). Right Ventricle: The right ventricular size is normal. No increase in right ventricular wall thickness.  Right ventricular systolic function is normal. Left Atrium: Left atrial size was normal in size. Right Atrium: Right atrial size was normal in size. Pericardium: There is no evidence of pericardial effusion. Mitral Valve: The mitral valve is normal in structure. No evidence of mitral valve regurgitation. No evidence of mitral valve stenosis. Tricuspid Valve: The tricuspid valve is normal in structure. Tricuspid valve regurgitation is not demonstrated. No evidence of tricuspid stenosis. Aortic Valve: The aortic valve was not well visualized. There is mild calcification of the aortic valve. There is mild thickening of the aortic valve. Aortic valve regurgitation is not visualized. No aortic stenosis is present. Pulmonic Valve: The pulmonic valve was not well visualized. Pulmonic valve regurgitation is not visualized. No evidence of pulmonic stenosis. Aorta: The aortic root is normal in size and structure. Venous: The inferior vena cava is normal in size with greater than 50% respiratory variability, suggesting right atrial pressure of 3 mmHg. IAS/Shunts: No atrial level shunt detected by color flow Doppler.   Diastology LV e' medial:    5.87 cm/s LV E/e' medial:  12.0 LV e' lateral:   7.94 cm/s LV E/e' lateral: 8.9  RIGHT VENTRICLE RV S prime:     18.00 cm/s TAPSE (M-mode): 2.1 cm LEFT ATRIUM             Index        RIGHT ATRIUM          Index LA Vol (A2C):   39.4 ml 21.26 ml/m  RA Area:     8.26 cm LA Vol (A4C):   24.5 ml 13.22 ml/m  RA Volume:   12.60 ml 6.80 ml/m LA Biplane Vol: 31.1 ml 16.78 ml/m  AORTIC VALVE LVOT Vmax:   92.20 cm/s LVOT Vmean:  60.000 cm/s LVOT VTI:    0.183 m MITRAL VALVE MV Area (PHT): 2.39 cm     SHUNTS MV Decel Time: 317 msec     Systemic VTI: 0.18 m MV E velocity: 70.40 cm/s MV A velocity: 106.00 cm/s MV E/A ratio:  0.66 Aditya Sabharwal Electronically signed by Hebert Soho Signature Date/Time: 03/27/2022/2:13:31 PM  Final    DG Abd Portable 1V  Result Date:  03/27/2022 CLINICAL DATA:  Feeding tube placement EXAM: PORTABLE ABDOMEN - 1 VIEW limited for tube placement COMPARISON:  None Available. FINDINGS: Limited x-ray of the upper abdomen has Dobbhoff tube with the tip extending into the right upper quadrant this could be proximal duodenal. There is limited slack in the stomach. Gas is seen in nondilated loops of bowel elsewhere in the visualized upper abdomen. IMPRESSION: Feeding tube in place with tip in the right midabdomen, possibly proximal duodenal. Minimal slack in the stomach Electronically Signed   By: Jill Side M.D.   On: 03/27/2022 14:09    PHYSICAL EXAM  Temp:  [98.2 F (36.8 C)-100.5 F (38.1 C)] 98.3 F (36.8 C) (03/25 1158) Pulse Rate:  [48-101] 88 (03/25 1330) Resp:  [11-29] 22 (03/25 1330) BP: (137-207)/(77-113) 207/107 (03/25 1300) SpO2:  [88 %-99 %] 94 % (03/25 1330) Arterial Line BP: (126-212)/(64-126) 191/87 (03/25 1200) Weight:  [77.8 kg] 77.8 kg (03/25 0500)  General critically ill on ventilator with no sedation Cardiovascular -on BiPAP  Mental Status -  Eyes are closed.  Opens eyes to voice and noxious stimuli.  He will intermittently follow commands.  Speech is incomprehensible.  Mild dysarthria. Cranial Nerves II - XII - II - Visual field intact blinks to threat only on the left III, IV, VI -slight left gaze preference, eyes do not cross all the way to the right V - Facial sensation intact bilaterally. VII -unable to assess VIII - Hearing & vestibular intact bilaterally. X - Palate elevates symmetrically. XI - Chin turning & shoulder shrug intact bilaterally. XII - Tongue protrusion intact.  Motor Strength -left side is purposeful.  Right side is hemiplegic Motor Tone - Muscle tone was assessed at the neck and appendages and was normal. Sensory -decreased sensation on the  right  Coordination -unable to assess  Gait and Station - deferred.  ASSESSMENT/PLAN [name] is a 77 y.o. male with history  of uncontrolled diabetes, sleep apnea, hypertension who had not been doing well for the past few days and was taken to his primary care's office for management of high blood sugars, was noted to have a sudden onset of bradycardia, unresponsiveness and vomiting at the doctor's office. EMS was called and they noted that he has a leftward gaze preference as well as right hemiplegia, and activated a code stroke and brought him to the ER.   Stroke: Large acute left PCA infarct Etiology: Large vessel disease versus cardiogenic embolism  code Stroke CT head No acute abnormality. ASPECTS 10.    CTA head & neck left P1 patient in severe stenosis of the right P2 Cerebral angio noted left posterior cerebral artery unsuccessful MRI large left acute PCA infarct with mild petechial blood products in left occipital lobe 2D Echo technically difficult study.  Left ventricular ejection fraction 50 to 75%.  Left atrium size is normal   LDL 64 HgbA1c 11.1 VTE prophylaxis -heparin subcu    Diet   Diet NPO time specified   81 mg prior to admission, now on aspirin 81 mg and Plavix 75 mg.  Therapy recommendations: Pending Disposition: Pending   Hypertension Home meds: Norvasc 10, losartan 100 mg, metoprolol 100 mg Stable Was on Levophed @ 12 mcg, can D/c  BP goal less than 180 Long-term BP goal normotensive  Hyperlipidemia Home meds: Zetia 10 mg, atorvastatin 80 mg, resumed in hospital LDL 64, goal < 70 Continue statin at  discharge  Acute hypoxic respiratory failure due to stroke, extubated Possible aspiration pneumonia Management per CCM Antibiotics per CCM Currently on BiPAP  Dysphagia Consider starting tube feeds if patient not able to be extubated Will need core track for meds and feeds  Diabetes type II UnControlled Home meds: Trulicity, Jardiance 25 mg HgbA1c 11.1, goal < 7.0 CBGs Recent Labs    03/27/22 0309 03/27/22 0721 03/27/22 1150  GLUCAP 188* 155* 189*    SSI Will need  diabetes management with PCP after discharge  Other Stroke Risk Factors Advanced Age >/= 44  Cigarette smoker advised to stop smoking Coronary artery disease Obstructive sleep apnea, on CPAP at home Congestive heart failure  Other Active Problems Acute kidney injury-creatinine 1.66 -179 Gout Hypokalemia K 4 .0 replaced will check in the a.m.  Hospital day # 3  Patient has been unable to swallow.  He will need panda tube for nutrition and medicines.Starleen Blue out of bed.  Therapy consults.  Will likely need rehabilitation and inpatient or skilled nursing.  Depending upon family support.   transfer to neurology stepdown bed when available.  Discussed with Dr. Verlee Monte critical care medicine.  Long discussion with family at the bedside and answered questions.  This patient is critically ill and at significant risk of neurological worsening, death and care requires constant monitoring of vital signs, hemodynamics,respiratory and cardiac monitoring, extensive review of multiple databases, frequent neurological assessment, discussion with family, other specialists and medical decision making of high complexity.I have made any additions or clarifications directly to the above note.This critical care time does not reflect procedure time, or teaching time or supervisory time of PA/NP/Med Resident etc but could involve care discussion time.  I spent 30 minutes of neurocritical care time  in the care of  this patient.      Antony Contras, MD Medical Director [facility] Pager: [phone] 03/27/2022 2:24 PM     To contact Stroke Continuity provider, please refer to [email or web address] After hours, contact General Neurology

## 2022-03-27 NOTE — TOC Initial Note (Signed)
Transition of Care [facility]) - Initial/Assessment Note    Patient Details  Name: Shane Jones MRN: [id number] Date of Birth: 10-04-1945  Transition of Care [facility]) CM/SW Contact:    Tom-Johnson, Renea Ee, RN Phone Number: 03/27/2022, 3:02 PM  Clinical Narrative:                  CM spoke with patient's daughter, [name] and son, [name] at bedside about discharge disposition.  [name] requesting information about Medicare and Medicaid and the next step of transition. CM informed [name] that CIR has been recommended and they have to screen patient for eligibility and then start insurance auth which could be accepted or denied. [name] states patient does not have Medicaid and CM encouraged [name] to apply for one . CM called the [facility] 72 hrs notification hotline and Notification ID is [id number].    CM called the [facility] number [phone] ex [phone]) and left a message for a return call as [name] is requesting information about benefits patient is eligible for.  Awaiting return call. CM will continue to follow as patient progresses with care towards discharge.      Expected Discharge Plan: IP Rehab Facility Barriers to Discharge: Continued Medical Work up   Patient Goals and CMS Choice Patient states their goals for this hospitalization and ongoing recovery are:: To go to rehab and return home CMS Medicare.gov Compare Post Acute Care list provided to:: Patient Choice offered to / list presented to : Patient, Adult Children (Son and daughter)      Expected Discharge Plan and Services   Discharge Planning Services: CM Consult Post Acute Care Choice: IP Rehab Living arrangements for the past 2 months: Single Family Home                                      Prior Living Arrangements/Services Living arrangements for the past 2 months: Single Family Home Lives with:: Self Patient language and need for interpreter reviewed:: Yes Do you feel safe going back to the place  where you live?: Yes      Need for Family Participation in Patient Care: Yes (Comment) Care giver support system in place?: Yes (comment) Current home services: DME (All necessary DME's) Criminal Activity/Legal Involvement Pertinent to Current Situation/Hospitalization: No - Comment as needed  Activities of Daily Living      Permission Sought/Granted Permission sought to share information with : Facility Sport and exercise psychologist, Tourist information centre manager, Family Supports Permission granted to share information with : Yes, Verbal Permission Granted              Emotional Assessment Appearance:: Appears stated age Attitude/Demeanor/Rapport: Engaged, Gracious Affect (typically observed): Accepting, Calm, Pleasant Orientation: : Oriented to Self Alcohol / Substance Use: Not Applicable Psych Involvement: No (comment)  Admission diagnosis:  Acute ischemic stroke ([place]) [I63.9] Cerebrovascular accident (CVA), unspecified mechanism ([place]) [I63.9] Occlusion and stenosis of left posterior cerebral artery [I66.22] Patient Active Problem List   Diagnosis Date Noted   Malnutrition of moderate degree 03/27/2022   Altered mental status 03/24/2022   Acute ischemic stroke ([place]) 03/24/2022   Ventilator dependence ([place]) 03/24/2022   Occlusion and stenosis of left posterior cerebral artery 03/24/2022   Cataracts, bilateral 07/25/2021   Allergic rhinitis 04/12/2021   Grief 02/02/2021   Urinary urgency 11/15/2020   History of hypokalemia 11/15/2020   Erectile dysfunction 06/29/2020   Tick bite of foot 06/29/2020   Headache  05/13/2020   Overweight 05/30/2019   AKI (acute kidney injury) ([place]) 01/22/2019   Benign hypertensive kidney disease with chronic kidney disease 01/22/2019   Secondary hyperparathyroidism of renal origin ([place]) 01/22/2019   Stage 3a chronic kidney disease ([place]) 01/22/2019   Lightheadedness 12/19/2018   Skin lesions 01/21/2018   Esophagitis    Proteinuria 10/10/2017   Onychomycosis  03/19/2017   Anxiety and depression 09/19/2016   Hypokalemia 09/19/2016   Loss of height 05/01/2016   Left shoulder pain 01/31/2016   Sleep apnea 11/22/2011   Chronic back pain 08/17/2011   Hyperlipidemia 08/17/2011   Gout 08/17/2011   T2DM (type 2 diabetes mellitus) ([place]) 02/20/2011   Neuropathy ([place]) 02/17/2011   Hypertension 10/13/2010   PCP:  Leone Haven, MD Pharmacy:   [place], [place] - [address] [place] [address] Phone: [phone] Fax: [place] [address] Phone: [phone] Fax: [phone]  [facility] [id number] [place], [place] - [address] [place] [address] Phone: [phone] Fax: [phone]  [facility] [address] [place] [address] Phone: [phone] Fax: [phone]     Social Determinants of Health (SDOH) Social History: SDOH Screenings   Food Insecurity: No Food Insecurity (05/23/2021)  Housing: Medium Risk (05/23/2021)  Transportation Needs: No Transportation Needs (05/23/2021)  Depression (PHQ2-9): Low Risk  (03/24/2022)  Financial Resource Strain: High Risk (12/22/2021)  Physical Activity: Sufficiently Active (02/10/2020)  Social Connections: Socially Isolated (02/10/2020)  Stress: No Stress Concern Present (05/23/2021)  Tobacco Use: Medium Risk (03/25/2022)   SDOH Interventions: Transportation Interventions: Intervention Not Indicated, Inpatient TOC, Patient Resources (Friends/Family)   Readmission Risk Interventions    03/27/2022    2:58 PM  Readmission Risk Prevention Plan  Transportation Screening Complete  PCP or Specialist Appt within 5-7 Days Complete  Home Care Screening Complete  Medication Review (RN CM) Referral to Pharmacy

## 2022-03-27 NOTE — Evaluation (Signed)
Speech Language Pathology Evaluation Patient Details Name: Shane Jones MRN: [id number] DOB: [birth year] Today's Date: 03/27/2022 Time: [id number] SLP Time Calculation (min) (ACUTE ONLY): 28 min  Problem List:  Patient Active Problem List   Diagnosis Date Noted   Altered mental status 03/24/2022   Acute ischemic stroke ([place]) 03/24/2022   Ventilator dependence ([place]) 03/24/2022   Occlusion and stenosis of left posterior cerebral artery 03/24/2022   Cataracts, bilateral 07/25/2021   Allergic rhinitis 04/12/2021   Grief 02/02/2021   Urinary urgency 11/15/2020   History of hypokalemia 11/15/2020   Erectile dysfunction 06/29/2020   Tick bite of foot 06/29/2020   Headache 05/13/2020   Overweight 05/30/2019   AKI (acute kidney injury) ([place]) 01/22/2019   Benign hypertensive kidney disease with chronic kidney disease 01/22/2019   Secondary hyperparathyroidism of renal origin ([place]) 01/22/2019   Stage 3a chronic kidney disease ([place]) 01/22/2019   Lightheadedness 12/19/2018   Skin lesions 01/21/2018   Esophagitis    Proteinuria 10/10/2017   Onychomycosis 03/19/2017   Anxiety and depression 09/19/2016   Hypokalemia 09/19/2016   Loss of height 05/01/2016   Left shoulder pain 01/31/2016   Sleep apnea 11/22/2011   Chronic back pain 08/17/2011   Hyperlipidemia 08/17/2011   Gout 08/17/2011   T2DM (type 2 diabetes mellitus) ([place]) 02/20/2011   Neuropathy ([place]) 02/17/2011   Hypertension 10/13/2010   Past Medical History:  Past Medical History:  Diagnosis Date   Diabetes mellitus without complication ([place])    Gout    Hypertension    Sleep apnea    has CPAP, doesn't use   Ulcer of esophagus without bleeding    Wears dentures    full upper and lower   Past Surgical History:  Past Surgical History:  Procedure Laterality Date   BALLOON DILATION N/A 10/22/2017   Procedure: BALLOON DILATION;  Surgeon: Lucilla Lame, MD;  Location: [facility] [place];  Service: Endoscopy;  Laterality: N/A;    CATARACT EXTRACTION W/PHACO Right 02/02/2022   Procedure: CATARACT EXTRACTION PHACO AND INTRAOCULAR LENS PLACEMENT (IOC) RIGHT DIABETIC  6.48  00:45.6;  Surgeon: Norvel Richards, MD;  Location: [place];  Service: Ophthalmology;  Laterality: Right;  Diabetic   CATARACT EXTRACTION W/PHACO Left 02/16/2022   Procedure: CATARACT EXTRACTION PHACO AND INTRAOCULAR LENS PLACEMENT ([place]) LEFT DIABTIC  7.58  00:46.2;  Surgeon: Norvel Richards, MD;  Location: [place];  Service: Ophthalmology;  Laterality: Left;  Diabetic   ESOPHAGOGASTRODUODENOSCOPY (EGD) WITH PROPOFOL N/A 10/22/2017   Procedure: ESOPHAGOGASTRODUODENOSCOPY (EGD) WITH Biopsy;  Surgeon: Lucilla Lame, MD;  Location: [place];  Service: Endoscopy;  Laterality: N/A;   RADIOLOGY WITH ANESTHESIA N/A 03/24/2022   Procedure: IR WITH ANESTHESIA;  Surgeon: Radiologist, Medication, MD;  Location: [facility];  Service: Radiology;  Laterality: N/A;   TONSILLECTOMY     HPI:  Shane Jones is a 77 y.o. male past medical history of uncontrolled diabetes, sleep apnea, hypertension who had not been doing well for the past few days and was taken to his primary care's office for management of high blood sugars, was noted to have a sudden onset of bradycardia, unresponsiveness and vomiting at the doctor's office. EMS was called and they noted that he has a leftward gaze preference as well as right hemiplegia, and activated a code stroke and brought him to the ER.MRI head revealed on 03/25/22 Large acute left PCA distribution infarct. Associated mild petechial blood products at the left occipital lobe without significant mass effect. SLE/BSE generated to assess  swallow and speech/language function.   Assessment / Plan / Recommendation Clinical Impression  Pt seen for speech/language cognitive assessment with pt being unable to follow 1-step commands or answer simple personal information questions.  Focused attention altered as pt  could not attend to L side and was agitated, restless and refused most tasks attempted.  Education provided for pt's family regarding CVA and effects of this impacting his overall functioning with attention, auditory comprehension and verbal expression affected with functional tasks/identifying basic needs/wants.  Pt verbalized a few times during evaluation, but responses were unintelligible.  Recommend for ST to f/u for cog/linguistic deficits acutely with recommendation for CIR as appropriate.  Thank you for this consult.    SLP Assessment  SLP Recommendation/Assessment: Patient needs continued Speech Lanaguage Pathology Services SLP Visit Diagnosis: Cognitive communication deficit (R41.841);Attention and concentration deficit;Aphasia (R47.01) Attention and concentration deficit following: Cerebral infarction    Recommendations for follow up therapy are one component of a multi-disciplinary discharge planning process, led by the attending physician.  Recommendations may be updated based on patient status, additional functional criteria and insurance authorization.    Follow Up Recommendations   (TBD)    Assistance Recommended at Discharge  Frequent or constant Supervision/Assistance  Functional Status Assessment Patient has had a recent decline in their functional status and demonstrates the ability to make significant improvements in function in a reasonable and predictable amount of time.  Frequency and Duration min 2x/week  1 week      SLP Evaluation Cognition  Overall Cognitive Status: Impaired/Different from baseline Arousal/Alertness: Awake/alert Orientation Level: Disoriented to person;Disoriented to place;Disoriented to time;Disoriented to situation Attention: Sustained Sustained Attention: Impaired Sustained Attention Impairment: Verbal basic;Functional basic Awareness: Impaired Awareness Impairment: Intellectual impairment;Emergent impairment;Anticipatory impairment Problem  Solving: Impaired Problem Solving Impairment: Verbal basic;Functional basic Behaviors: Restless;Impulsive;Physical agitation Safety/Judgment: Impaired Comments: trying to get out of bed, pull IV (had restraints on)       Comprehension  Auditory Comprehension Overall Auditory Comprehension: Impaired Yes/No Questions: Impaired Basic Biographical Questions: 0-25% accurate Commands: Impaired One Step Basic Commands: 0-24% accurate Conversation: Other (comment) (n/a) Interfering Components: Attention;Working memory;Processing speed;Visual impairments Retail banker: Not tested Reading Comprehension Reading Status: Not tested    Expression Expression Primary Mode of Expression: Nonverbal - gestures Verbal Expression Overall Verbal Expression: Impaired Naming: Not tested Pragmatics: Unable to assess Interfering Components: Attention Non-Verbal Means of Communication: Gestures Written Expression Dominant Hand: Right Written Expression: Not tested   Oral / Motor  Oral Motor/Sensory Function Overall Oral Motor/Sensory Function: Generalized oral weakness (DTA) Motor Speech Overall Motor Speech: Other (comment) (unable to assess) Respiration: Within functional limits Phonation: Normal Resonance: Within functional limits Intelligibility: Not tested Motor Planning: Not tested Motor Speech Errors: Not applicable            Elvina Sidle 03/27/2022, 1:20 PM

## 2022-03-27 NOTE — Procedures (Signed)
Cortrak  Person Inserting Tube:  Alroy Dust, Georjean Toya L, RD Tube Type:  Cortrak - 43 inches Tube Size:  10 Tube Location:  Right nare Secured by: Bridle Technique Used to Measure Tube Placement:  Marking at nare/corner of mouth Cortrak Secured At:  63 cm   Cortrak Tube Team Note:  Consult received to place a Cortrak feeding tube.   X-ray is required, abdominal x-ray has been ordered by the Cortrak team. Please confirm tube placement before using the Cortrak tube.   If the tube becomes dislodged please keep the tube and contact the Cortrak team at [email or web address] for replacement.  If after hours and replacement cannot be delayed, place a NG tube and confirm placement with an abdominal x-ray.    Hermina Barters RD, LDN Clinical Dietitian See Shea Evans for contact information.

## 2022-03-27 NOTE — Evaluation (Signed)
Occupational Therapy Evaluation Patient Details Name: Shane Jones MRN: [id number] DOB: September 10, 1945 Today's Date: 03/27/2022   History of Present Illness 77 year old male admitted to ED on 3/22 for R weakness, L gaze, vomiting at MD office. CT CTA shows P1 occlusion, s/p L vertebral artery arteriogram unsuccessful due to significant tortuosity at aortic arch, L subclavian artery, L vertebral artery.   Clinical Impression   Shane Jones was evaluated s/p the above admission list. Per family, he is indep at baseline. Upon evaluation he was limited by R hemiplegia, R neglect, impaired communication and cognition, impulsivity, restlessness, balance, attention and progressive agitation. Overall he required total A +2 for safety bed mobility and needed min-max A for sitting balance. Due to the deficits listed below, he also requires max A +2 for UB ADLs and total A +2 fro LB ADLs at bed level. Pt will benefit from continued acute OT services. Pt would benefit from intensive post-acute rehab >3hr/day to progress function.       Recommendations for follow up therapy are one component of a multi-disciplinary discharge planning process, led by the attending physician.  Recommendations may be updated based on patient status, additional functional criteria and insurance authorization.   Assistance Recommended at Discharge Frequent or constant Supervision/Assistance  Patient can return home with the following A lot of help with walking and/or transfers;Two people to help with walking and/or transfers;A lot of help with bathing/dressing/bathroom;Two people to help with bathing/dressing/bathroom;Assist for transportation;Help with stairs or ramp for entrance    Functional Status Assessment  Patient has had a recent decline in their functional status and demonstrates the ability to make significant improvements in function in a reasonable and predictable amount of time.  Equipment Recommendations  None recommended by OT  (defer)    Recommendations for Other Services Rehab consult     Precautions / Restrictions Precautions Precautions: Fall Precaution Comments: R inattention vs neglect Restrictions Weight Bearing Restrictions: No      Mobility Bed Mobility Overal bed mobility: Needs Assistance Bed Mobility: Supine to Sit, Sit to Supine     Supine to sit: Max assist, +2 for physical assistance, +2 for safety/equipment Sit to supine: Total assist, +2 for physical assistance, +2 for safety/equipment   General bed mobility comments: increased assist needed due to impaired congition, R neglect, impulsivity, safety    Transfers Overall transfer level: Needs assistance                 General transfer comment: deferred for safety      Balance Overall balance assessment: Needs assistance Sitting-balance support: Feet supported, Single extremity supported Sitting balance-Leahy Scale: Poor Sitting balance - Comments: min-max A for balance                                   ADL either performed or assessed with clinical judgement   ADL Overall ADL's : Needs assistance/impaired Eating/Feeding: NPO   Grooming: Maximal assistance;Sitting   Upper Body Bathing: Maximal assistance;Sitting   Lower Body Bathing: Total assistance;Bed level   Upper Body Dressing : Maximal assistance;Sitting   Lower Body Dressing: Total assistance;Bed level   Toilet Transfer: Total assistance;+2 for physical assistance;+2 for safety/equipment   Toileting- Clothing Manipulation and Hygiene: Total assistance;+2 for physical assistance;+2 for safety/equipment;Bed level       Functional mobility during ADLs: Total assistance;+2 for physical assistance;+2 for safety/equipment General ADL Comments: due to impiared cognition, safety, R neglect  Vision Baseline Vision/History: 0 No visual deficits Vision Assessment?: Vision impaired- to be further tested in functional context Additional  Comments: R neglect. R eye did not blink to threat. Difficult to assess     Perception Perception Perception Tested?: Yes Perception Deficits: Inattention/neglect Inattention/Neglect: Does not attend to right side of body;Does not attend to right visual field;Impaired- to be further tested in functional context Comments: Dense R neglect   Praxis Praxis Praxis tested?: Deficits    Pertinent Vitals/Pain Pain Assessment Pain Assessment: Faces Faces Pain Scale: No hurt Pain Intervention(s): Monitored during session     Hand Dominance Right   Extremity/Trunk Assessment Upper Extremity Assessment Upper Extremity Assessment: Defer to OT evaluation RUE Deficits / Details: no activation. tight shoulder PROM, flaccid elbow, wrist and hand. edematous RUE Sensation: decreased light touch;decreased proprioception RUE Coordination: decreased fine motor;decreased gross motor LUE Deficits / Details: unable to formally assess, moving through full ROM with good strength   Lower Extremity Assessment Lower Extremity Assessment: RLE deficits/detail RLE Deficits / Details: 0/5 observed throughout, hold RLE in extension in supine and hypertonicity noted when moving pt through hip and knee flexion. RLE Sensation: decreased light touch (no indication pt aware of pain or light touch on assessment) RLE Coordination: decreased gross motor;decreased fine motor   Cervical / Trunk Assessment Cervical / Trunk Assessment: Other exceptions Cervical / Trunk Exceptions: L lateral bias in sitting   Communication Communication Communication: Receptive difficulties;Expressive difficulties   Cognition Arousal/Alertness: Awake/alert Behavior During Therapy: Agitated, Restless, Impulsive Overall Cognitive Status: Difficult to assess Area of Impairment: Attention, Following commands, Awareness                   Current Attention Level: Sustained       Awareness: Intellectual   General Comments:  minimal veralizations made. no command following despite multimodal cues. Did not initiate functional task. R neglect. Highly distractible in L environment     General Comments  VSS on RA, family present    Exercises     Shoulder Instructions      Home Living Family/patient expects to be discharged to:: Private residence Living Arrangements: Alone Available Help at Discharge: Family Type of Home: House (following information is about pt's son's house, family states this is where he will d/c when he is ready for home) Home Access: Ramped entrance     Home Layout: Multi-level Alternate Level Stairs-Number of Steps: level entry from outside, flight from main level of house   Bathroom Shower/Tub: Walk-in shower (WC accessible)   Bathroom Toilet: Handicapped height     [place] (2 wheels)   Additional Comments: plans to d/c to son's home with the above information      Prior Functioning/Environment Prior Level of Function : Independent/Modified Independent             Mobility Comments: no AD. Per family, he was a little unsteady. No falls ADLs Comments: indep ADL/IADLs        OT Problem List: Decreased strength;Decreased range of motion;Decreased activity tolerance;Impaired balance (sitting and/or standing);Decreased coordination;Decreased cognition;Decreased safety awareness;Decreased knowledge of use of DME or AE;Decreased knowledge of precautions;Impaired sensation;Impaired tone;Impaired UE functional use;Increased edema      OT Treatment/Interventions: Self-care/ADL training;Therapeutic exercise;Neuromuscular education;DME and/or AE instruction;Therapeutic activities;Patient/family education;Balance training    OT Goals(Current goals can be found in the care plan section) Acute Rehab OT Goals Patient Stated Goal: unable to state OT Goal Formulation: With patient Time For Goal  Achievement:  04/10/22 Potential to Achieve Goals: Good ADL Goals Pt Will Perform Grooming: with mod assist;sitting Pt Will Perform Upper Body Dressing: with mod assist;sitting Additional ADL Goal #1: Pt will complete bed mobility with mod A as a precursor to ADLs Additional ADL Goal #2: Pt will locate at least 3 items in R environment with mod A as a precursor to ADL task  OT Frequency: Min 2X/week    Co-evaluation PT/OT/SLP Co-Evaluation/Treatment: Yes Reason for Co-Treatment: For patient/therapist safety;Complexity of the patient's impairments (multi-system involvement);To address functional/ADL transfers;Necessary to address cognition/behavior during functional activity PT goals addressed during session: Mobility/safety with mobility;Balance OT goals addressed during session: ADL's and self-care      AM-PAC OT "6 Clicks" Daily Activity     Outcome Measure Help from another person eating meals?: Total Help from another person taking care of personal grooming?: Total Help from another person toileting, which includes using toliet, bedpan, or urinal?: Total Help from another person bathing (including washing, rinsing, drying)?: A Lot Help from another person to put on and taking off regular upper body clothing?: A Lot Help from another person to put on and taking off regular lower body clothing?: Total 6 Click Score: 8   End of Session Nurse Communication: Mobility status  Activity Tolerance: Patient tolerated treatment well Patient left: in bed;with call bell/phone within reach;with bed alarm set;with family/visitor present  OT Visit Diagnosis: Unsteadiness on feet (R26.81);Other abnormalities of gait and mobility (R26.89);Muscle weakness (generalized) (M62.81);Cognitive communication deficit (R41.841);Hemiplegia and hemiparesis Symptoms and signs involving cognitive functions: Cerebral infarction Hemiplegia - Right/Left: Right Hemiplegia - dominant/non-dominant: Dominant Hemiplegia - caused  by: Cerebral infarction                [id number] OT Time Calculation (min): 30 min Charges:  OT General Charges $OT Visit: 1 Visit OT Evaluation $OT Eval Moderate Complexity: 1 Mod  Shade Flood, OTR/L Acute Rehabilitation Services Office [phone] Secure Chat Communication Preferred   Elliot Cousin 03/27/2022, 10:57 AM

## 2022-03-27 NOTE — Progress Notes (Signed)
Inpatient Rehab Admissions Coordinator:   Per therapy recommendations pt was screened for CIR by Shann Medal, PT, DPT.  Note EOB only with total +2 assist.  Will follow for next therapy session to assess progress and whether pt would tolerate intensity of CIR/make measurable gains.  No consult at this time.   Shann Medal, PT, DPT Admissions Coordinator [phone] 03/27/22  1:40 PM

## 2022-03-27 NOTE — Evaluation (Signed)
Physical Therapy Evaluation Patient Details Name: Shane Jones MRN: [id number] DOB: June 27, 1945 Today's Date: 03/27/2022  History of Present Illness  77 year old male admitted to ED on 3/22 for R weakness, L gaze, vomiting at MD office. CT CTA shows P1 occlusion, s/p L vertebral artery arteriogram unsuccessful due to significant tortuosity at aortic arch, L subclavian artery, L vertebral artery.  Clinical Impression   Pt presents with R hemiplegia, significant R inattention with no awareness of R side body, restlessness verging on agitation with mobility, impaired cognition, impaired balance, and decreased activity tolerance vs baseline. Pt to benefit from acute PT to address deficits. Pt requiring max-total +2 for moving to/from EOB, pt with L lateral bias and requiring significant truncal support to maintain upright sitting. Pt increasingly irritable with continued sitting, transitioned back to supine for pt safety. PT anticipates post-acute rehab needs. PT to progress mobility as tolerated, and will continue to follow acutely.         Recommendations for follow up therapy are one component of a multi-disciplinary discharge planning process, led by the attending physician.  Recommendations may be updated based on patient status, additional functional criteria and insurance authorization.  Follow Up Recommendations       Assistance Recommended at Discharge Frequent or constant Supervision/Assistance  Patient can return home with the following  Two people to help with walking and/or transfers;Two people to help with bathing/dressing/bathroom;Assistance with cooking/housework;Direct supervision/assist for medications management;Direct supervision/assist for financial management;Assist for transportation;Help with stairs or ramp for entrance    Equipment Recommendations None recommended by PT  Recommendations for Other Services       Functional Status Assessment Patient has had a recent  decline in their functional status and demonstrates the ability to make significant improvements in function in a reasonable and predictable amount of time.     Precautions / Restrictions Precautions Precautions: Fall Precaution Comments: R inattention vs neglect Restrictions Weight Bearing Restrictions: No      Mobility  Bed Mobility Overal bed mobility: Needs Assistance Bed Mobility: Supine to Sit, Sit to Supine     Supine to sit: Max assist, +2 for physical assistance, +2 for safety/equipment Sit to supine: Total assist, +2 for physical assistance, +2 for safety/equipment   General bed mobility comments: increased assist needed due to impaired congition, R neglect, impulsivity, safety    Transfers Overall transfer level: Needs assistance                 General transfer comment: deferred for safety    Ambulation/Gait                  Stairs            Wheelchair Mobility    Modified Rankin (Stroke Patients Only) Modified Rankin (Stroke Patients Only) Pre-Morbid Rankin Score: No symptoms Modified Rankin: Severe disability     Balance Overall balance assessment: Needs assistance Sitting-balance support: Feet supported, Single extremity supported Sitting balance-Leahy Scale: Poor Sitting balance - Comments: min-max A for balance                                     Pertinent Vitals/Pain Pain Assessment Pain Assessment: Faces Faces Pain Scale: Hurts little more Pain Location: generalized discomfort Pain Descriptors / Indicators: Discomfort, Grimacing, Guarding Pain Intervention(s): Limited activity within patient's tolerance, Monitored during session    Home Living Family/patient expects to be discharged to:: Private residence Living  Arrangements: Alone Available Help at Discharge: Family Type of Home: House (following information is about pt's son's house, family states this is where he will d/c when he is ready for  home) Home Access: Ramped entrance     Alternate Level Stairs-Number of Steps: level entry from outside, flight from main level of house Home Layout: [place] (2 wheels) Additional Comments: plans to d/c to son's home with the above information    Prior Function Prior Level of Function : Independent/Modified Independent             Mobility Comments: no AD. Per family, he was a little unsteady. No falls ADLs Comments: indep ADL/IADLs     Hand Dominance   Dominant Hand: Right    Extremity/Trunk Assessment   Upper Extremity Assessment Upper Extremity Assessment: Defer to OT evaluation RUE Deficits / Details: no activation. tight shoulder PROM, flaccid elbow, wrist and hand. edematous RUE Sensation: decreased light touch;decreased proprioception RUE Coordination: decreased fine motor;decreased gross motor LUE Deficits / Details: unable to formally assess, moving through full ROM with good strength    Lower Extremity Assessment Lower Extremity Assessment: RLE deficits/detail RLE Deficits / Details: 0/5 observed throughout, hold RLE in extension in supine and hypertonicity noted when moving pt through hip and knee flexion. RLE Sensation: decreased light touch (no indication pt aware of pain or light touch on assessment) RLE Coordination: decreased gross motor;decreased fine motor    Cervical / Trunk Assessment Cervical / Trunk Assessment: Other exceptions Cervical / Trunk Exceptions: L lateral bias in sitting  Communication   Communication: Receptive difficulties;Expressive difficulties  Cognition Arousal/Alertness: Awake/alert Behavior During Therapy: Restless, Agitated Overall Cognitive Status: Difficult to assess                                 General Comments: does not follow commands (thumbs up, mobility cues), inattentive vs neglect R side. Irritable verging on agitated  during mobility and with fatigue, pt irritated by a-line on L hand. Limited vocalization during session, clearly curses x2 but other than that pt unintelligible        General Comments General comments (skin integrity, edema, etc.): vss on RA, pt's daughter and son present    Exercises     Assessment/Plan    PT Assessment Patient needs continued PT services  PT Problem List Decreased strength;Decreased mobility;Decreased safety awareness;Decreased activity tolerance;Decreased balance;Decreased knowledge of use of DME;Decreased knowledge of precautions;Decreased coordination;Impaired tone;Decreased cognition;Impaired sensation       PT Treatment Interventions DME instruction;Therapeutic activities;Gait training;Therapeutic exercise;Patient/family education;Balance training;Neuromuscular re-education;Functional mobility training    PT Goals (Current goals can be found in the Care Plan section)  Acute Rehab PT Goals PT Goal Formulation: With patient/family Time For Goal Achievement: 04/10/22 Potential to Achieve Goals: Good    Frequency Min 4X/week     Co-evaluation PT/OT/SLP Co-Evaluation/Treatment: Yes Reason for Co-Treatment: For patient/therapist safety;Complexity of the patient's impairments (multi-system involvement);To address functional/ADL transfers;Necessary to address cognition/behavior during functional activity PT goals addressed during session: Mobility/safety with mobility;Balance OT goals addressed during session: ADL's and self-care       AM-PAC PT "6 Clicks" Mobility  Outcome Measure Help needed turning from your back to your side while in a flat bed without using bedrails?: Total Help needed moving from lying on your back to sitting on the side of a flat bed without using bedrails?: Total Help needed  moving to and from a bed to a chair (including a wheelchair)?: Total Help needed standing up from a chair using your arms (e.g., wheelchair or bedside chair)?:  Total Help needed to walk in hospital room?: Total Help needed climbing 3-5 steps with a railing? : Total 6 Click Score: 6    End of Session   Activity Tolerance: Patient tolerated treatment well Patient left: in bed;with call bell/phone within reach;with bed alarm set;with family/visitor present Nurse Communication: Mobility status PT Visit Diagnosis: Hemiplegia and hemiparesis;Other abnormalities of gait and mobility (R26.89) Hemiplegia - Right/Left: Right Hemiplegia - dominant/non-dominant: Dominant Hemiplegia - caused by: Cerebral infarction    Time: [id number] PT Time Calculation (min) (ACUTE ONLY): 31 min   Charges:   PT Evaluation $PT Eval Moderate Complexity: 1 Mod          Lexie Morini S, PT DPT Acute Rehabilitation Services Pager [phone]  Office [phone]   [place] 03/27/2022, 11:02 AM

## 2022-03-28 ENCOUNTER — Inpatient Hospital Stay (HOSPITAL_COMMUNITY): Payer: PPO

## 2022-03-28 DIAGNOSIS — I639 Cerebral infarction, unspecified: Secondary | ICD-10-CM | POA: Diagnosis not present

## 2022-03-28 LAB — BASIC METABOLIC PANEL
Anion gap: 15 (ref 5–15)
BUN: 35 mg/dL — ABNORMAL HIGH (ref 8–23)
CO2: 26 mmol/L (ref 22–32)
Calcium: 9.7 mg/dL (ref 8.9–10.3)
Chloride: 105 mmol/L (ref 98–111)
Creatinine, Ser: 1.78 mg/dL — ABNORMAL HIGH (ref 0.61–1.24)
GFR, Estimated: 38 mL/min — ABNORMAL LOW (ref >60)
Glucose, Bld: 149 mg/dL — ABNORMAL HIGH (ref 70–99)
Potassium: 3.4 mmol/L — ABNORMAL LOW (ref 3.5–5.1)
Sodium: 146 mmol/L — ABNORMAL HIGH (ref 135–145)

## 2022-03-28 LAB — GLUCOSE, CAPILLARY
Glucose-Capillary: 392 mg/dL — ABNORMAL HIGH (ref 70–99)
Glucose-Capillary: 198 mg/dL — ABNORMAL HIGH (ref 70–99)
Glucose-Capillary: 291 mg/dL — ABNORMAL HIGH (ref 70–99)
Glucose-Capillary: 297 mg/dL — ABNORMAL HIGH (ref 70–99)
Glucose-Capillary: 369 mg/dL — ABNORMAL HIGH (ref 70–99)

## 2022-03-28 LAB — CBC
HCT: 46.9 % (ref 39.0–52.0)
Hemoglobin: 16.1 g/dL (ref 13.0–17.0)
MCH: 32.1 pg (ref 26.0–34.0)
MCHC: 34.3 g/dL (ref 30.0–36.0)
MCV: 93.6 fL (ref 80.0–100.0)
Platelets: 215 10*3/uL (ref 150–400)
RBC: 5.01 MIL/uL (ref 4.22–5.81)
RDW: 13 % (ref 11.5–15.5)
WBC: 7.8 10*3/uL (ref 4.0–10.5)
nRBC: 0 % (ref 0.0–0.2)

## 2022-03-28 LAB — PHOSPHORUS: Phosphorus: 2.5 mg/dL (ref 2.5–4.6)

## 2022-03-28 LAB — MAGNESIUM: Magnesium: 2.7 mg/dL — ABNORMAL HIGH (ref 1.7–2.4)

## 2022-03-28 MED ORDER — QUETIAPINE FUMARATE 25 MG PO TABS
25.0000 mg | ORAL_TABLET | Freq: Every day | ORAL | Status: DC
  Administered 2022-03-28 – 2022-03-29 (×2): 25 mg via ORAL
  Filled 2022-03-28 (×2): qty 1

## 2022-03-28 MED ORDER — QUETIAPINE FUMARATE 25 MG PO TABS
12.5000 mg | ORAL_TABLET | Freq: Every morning | ORAL | Status: DC
  Administered 2022-03-28 – 2022-03-29 (×2): 12.5 mg via ORAL
  Filled 2022-03-28 (×3): qty 1

## 2022-03-28 NOTE — Care Management Important Message (Signed)
Important Message  Patient Details  Name: Shane Jones MRN: [id number] Date of Birth: 10-03-1945   Medicare Important Message Given:  Yes     Shemika Robbs Montine Circle 03/28/2022, 1:55 PM

## 2022-03-28 NOTE — Progress Notes (Signed)
Speech Language Pathology Treatment: Dysphagia;Cognitive-Linquistic  Patient Details Name: Shane Jones MRN: [id number] DOB: [birth year] Today's Date: 03/28/2022 Time: [id number] SLP Time Calculation (min) (ACUTE ONLY): 16 min  Assessment / Plan / Recommendation Clinical Impression  Pt participated in therapy for aphasia/cognition and dysphagia. He was able to follow several commands with visual ad auditory cueing. No spontaneous utterances and despite prompts, phonemic cues and phrase completion cues unable to elicit phonation. He was restless, distracted with right inattention and brought gaze to midline.  After oral care he consumed applesauce and cup sips water with oral residue with applesauce falling from mouth after sip liquid. Delayed oral transit and suspect delayed pharyngeal swallow. Multiple cups sips with hand over hand resulted in one subtle throat clear. Pt needs instrumental assessment with MBS which is scheduled this morning at 10:00 to fully assess swallow and ability to initiation po's.   HPI HPI: Shane Jones is a 77 y.o. male past medical history of uncontrolled diabetes, sleep apnea, hypertension who had not been doing well for the past few days and was taken to his primary care's office for management of high blood sugars, was noted to have a sudden onset of bradycardia, unresponsiveness and vomiting at the doctor's office. EMS was called and they noted that he has a leftward gaze preference as well as right hemiplegia, and activated a code stroke and brought him to the ER.MRI head revealed on 03/25/22 Large acute left PCA distribution infarct. Associated mild petechial blood products at the left occipital lobe without significant mass effect. SLE/BSE generated to assess swallow and speech/language function.      SLP Plan  Continue with current plan of care;MBS      Recommendations for follow up therapy are one component of a multi-disciplinary discharge planning process, led by  the attending physician.  Recommendations may be updated based on patient status, additional functional criteria and insurance authorization.    Recommendations  Diet recommendations: NPO Medication Administration: Via alternative means                  Oral care QID;Oral care prior to ice chip/H20;Staff/trained caregiver to provide oral care   Frequent or constant Supervision/Assistance Cognitive communication deficit (R41.841);Aphasia (R47.01) Cerebral infarction   Continue with current plan of care;MBS     Houston Siren  03/28/2022, 8:59 AM

## 2022-03-28 NOTE — Progress Notes (Signed)
STROKE TEAM PROGRESS NOTE   INTERVAL HISTORY His wife and 1 more lady family member at the bedside.  Neurological exam is unchanged.  Remains aphasic with right hemiplegia and neglect and left gaze deviation.  He became agitated last night requiring restraints. Patient has been unable to swallow and has Bambec tube feeding.  Blood pressure adequately controlled.  Vital signs stable.  I called several times yesterday and left a message for his daughter to discuss goals of care but she was not available Vitals:   03/28/22 0359 03/28/22 0723 03/28/22 0931 03/28/22 1154  BP: (!) 192/117 (!) 183/117 (!) 118/106 (!) 184/107  Pulse: 87 96  72  Resp:  19 12 18   Temp: 98.9 F (37.2 C) 98.7 F (37.1 C)  (!) 100.8 F (38.2 C)  TempSrc: Oral Oral  Axillary  SpO2: 95% 97%  98%  Weight:      Height:       CBC:  Recent Labs  Lab 03/24/22 1226 03/24/22 1235 03/25/22 0339 03/26/22 1421 03/27/22 0556 03/28/22 1203  WBC 8.3  --  10.3  --  6.4 7.8  NEUTROABS 4.0  --  8.2*  --   --   --   HGB 14.9   < > 13.7   < > 12.9* 16.1  HCT 41.6   < > 37.6*   < > 37.8* 46.9  MCV 89.5  --  89.5  --  93.6 93.6  PLT 184  --  203  --  132* 215   < > = values in this interval not displayed.   Basic Metabolic Panel:  Recent Labs  Lab 03/27/22 0556 03/28/22 0728  NA 142 146*  K 4.0 3.4*  CL 108 105  CO2 24 26  GLUCOSE 161* 149*  BUN 22 35*  CREATININE 1.69* 1.78*  CALCIUM 8.8* 9.7  MG 2.3 2.7*  PHOS 3.3 2.5   Lipid Panel:  Recent Labs  Lab 03/25/22 0339  CHOL 128  TRIG 179*  HDL 28*  CHOLHDL 4.6  VLDL 36  LDLCALC 64   HgbA1c:  Recent Labs  Lab 03/24/22 1213  HGBA1C 11.1*   Urine Drug Screen:  Recent Labs  Lab 03/25/22 1224  LABOPIA NONE DETECTED  COCAINSCRNUR NONE DETECTED  LABBENZ POSITIVE*  AMPHETMU NONE DETECTED  THCU NONE DETECTED  LABBARB NONE DETECTED    Alcohol Level  Recent Labs  Lab 03/24/22 1522  ETH <10    IMAGING past 24 hours DG Swallowing Func-Speech  Pathology  Result Date: 03/28/2022 Table formatting from the original result was not included. Modified Barium Swallow Study Patient Details Name: Shane Jones MRN: [id number] Date of Birth: 12/17/1945 Today's Date: 03/28/2022 HPI/PMH: HPI: Shane Jones is a 77 y.o. male past medical history of uncontrolled diabetes, sleep apnea, hypertension who had not been doing well for the past few days and was taken to his primary care's office for management of high blood sugars, was noted to have a sudden onset of bradycardia, unresponsiveness and vomiting at the doctor's office. EMS was called and they noted that he has a leftward gaze preference as well as right hemiplegia, and activated a code stroke and brought him to the ER.MRI head revealed on 03/25/22 Large acute left PCA distribution infarct. Associated mild petechial blood products at the left occipital lobe without significant mass effect. SLE/BSE generated to assess swallow and speech/language function. Clinical Impression: Clinical Impression: Pt exhibited moderate oropharyngeal dysphagia with cues needed to attend due to distractibility and  right inattention. There was significantly decreased oral control with bolus spilling under tongue, oral transit  delays and lingual residue with all textures but moderate amount with honey barium. Independent subsequent swallows pt able to reduce resdiue to min-mild. Pharyngeally, there was aspiration with teaspoon thin liquid that spilled from his pyriform sinuses with immediate cough. Cup sip nectar thick aspirated during the swallow due to reduced laryngeal elevation and closure without sensation. Pt was unable to perform compensatory strategies given decreased sustained attention. Majority of pharyngeal residue was in his vallecuale and ranged from min-moderate with spontaneous swallows inconsistently decreasing to mild. Brief esophageal scan was unremarkable for significant findings. Recommend pt initiate puree (Dys 1),  honey thick liquid, check oral cavity for residue and will need FULL supervision to attend to meals and provide cueing, check for  oral clearance. Pills crushed in applesauce. ST to continue. Factors that may increase risk of adverse event in presence of aspiration ([place] 2021): Factors that may increase risk of adverse event in presence of aspiration Phineas Douglas & Padilla 2021): Reduced cognitive function (aphasia) Recommendations/Plan: Swallowing Evaluation Recommendations Swallowing Evaluation Recommendations Recommendations: PO diet PO Diet Recommendation: Dysphagia 1 (Pureed); Moderately thick liquids (Level 3, honey thick) Liquid Administration via: Cup Medication Administration: Crushed with puree Supervision: Staff to assist with self-feeding; Full supervision/cueing for swallowing strategies Swallowing strategies  : Slow rate; Small bites/sips; Check for pocketing or oral holding Postural changes: Stay upright 30-60 min after meals Oral care recommendations: Oral care BID (2x/day) Treatment Plan Treatment Plan Treatment recommendations: Therapy as outlined in treatment plan below Follow-up recommendations: Acute inpatient rehab (3 hours/day) Functional status assessment: Patient has had a recent decline in their functional status and demonstrates the ability to make significant improvements in function in a reasonable and predictable amount of time. Treatment frequency: Min 2x/week Treatment duration: 2 weeks Interventions: Aspiration precaution training; Compensatory techniques; Patient/family education; Trials of upgraded texture/liquids; Diet toleration management by SLP Recommendations Recommendations for follow up therapy are one component of a multi-disciplinary discharge planning process, led by the attending physician.  Recommendations may be updated based on patient status, additional functional criteria and insurance authorization. Assessment: Orofacial Exam: Orofacial Exam Oral Cavity:  Oral Hygiene: WFL Oral Cavity - Dentition: Edentulous Orofacial Anatomy: Other (comment) Oral Motor/Sensory Function: Unable to test Anatomy: Anatomy: Suspected cervical osteophytes Boluses Administered: Boluses Administered Boluses Administered: Thin liquids (Level 0); Mildly thick liquids (Level 2, nectar thick); Moderately thick liquids (Level 3, honey thick); Puree  Oral Impairment Domain: Oral Impairment Domain Lip Closure: No labial escape Tongue control during bolus hold: Escape to lateral buccal cavity/floor of mouth Bolus preparation/mastication: -- (NT solid) Bolus transport/lingual motion: Delayed initiation of tongue motion (oral holding) Oral residue: Residue collection on oral structures; Majority of bolus remaining (majority remainng with honey) Location of oral residue : Floor of mouth; Tongue Initiation of pharyngeal swallow : Pyriform sinuses; Valleculae  Pharyngeal Impairment Domain: Pharyngeal Impairment Domain Soft palate elevation: No bolus between soft palate (SP)/pharyngeal wall (PW) Laryngeal elevation: Partial superior movement of thyroid cartilage/partial approximation of arytenoids to epiglottic petiole Anterior hyoid excursion: Partial anterior movement Epiglottic movement: Complete inversion Laryngeal vestibule closure: Incomplete, narrow column air/contrast in laryngeal vestibule Pharyngeal stripping wave : Present - complete Pharyngeal contraction (A/P view only): N/A Pharyngoesophageal segment opening: Complete distension and complete duration, no obstruction of flow Tongue base retraction: Narrow column of contrast or air between tongue base and PPW Pharyngeal residue: Collection of residue within or on pharyngeal structures Location of pharyngeal  residue: Valleculae; Pyriform sinuses  Esophageal Impairment Domain: Esophageal Impairment Domain Esophageal clearance upright position: Complete clearance, esophageal coating Pill: Esophageal Impairment Domain Esophageal clearance upright  position: Complete clearance, esophageal coating Penetration/Aspiration Scale Score: Penetration/Aspiration Scale Score 7.  Material enters airway, passes BELOW cords and not ejected out despite cough attempt by patient: Thin liquids (Level 0) 8.  Material enters airway, passes BELOW cords without attempt by patient to eject out (silent aspiration) : Mildly thick liquids (Level 2, nectar thick) Compensatory Strategies: Compensatory Strategies Compensatory strategies: No (pt not appropriate)   General Information: Caregiver present: No  Diet Prior to this Study: NPO   Temperature : Normal   Respiratory Status: WFL   Supplemental O2: None (Room air)   History of Recent Intubation: Yes  Behavior/Cognition: Alert; Impulsive; Distractible; Requires cueing; Pleasant mood; Confused; Cooperative Self-Feeding Abilities: Dependent for feeding Baseline vocal quality/speech: -- (no vocalizations) Volitional Cough: Unable to elicit Volitional Swallow: Unable to elicit No data recorded Goal Planning: Prognosis for improved oropharyngeal function: Fair Barriers to Reach Goals: Cognitive deficits (communication impairments) No data recorded Patient/Family Stated Goal: Eat/drink; improve overall Consulted and agree with results and recommendations: Pt unable/family or caregiver not available; Nurse Pain: Pain Assessment Pain Assessment: Faces Faces Pain Scale: 0 Breathing: 0 Negative Vocalization: 0 Facial Expression: 0 Body Language: 1 Consolability: 1 PAINAD Score: 2 Facial Expression: 0 Body Movements: 0 Muscle Tension: 0 Compliance with ventilator (intubated pts.): N/A Vocalization (extubated pts.): N/A CPOT Total: 0 Pain Location: generalized discomfort Pain Descriptors / Indicators: Discomfort; Grimacing; Guarding Pain Intervention(s): Limited activity within patient's tolerance; Monitored during session End of Session: Start Time:SLP Start Time (ACUTE ONLY): 1017 Stop Time: SLP Stop Time (ACUTE ONLY): 1031 Time  Calculation:SLP Time Calculation (min) (ACUTE ONLY): 14 min Charges: SLP Evaluations $ SLP Speech Visit: 1 Visit SLP Evaluations $BSS Swallow: 1 Procedure $MBS Swallow: 1 Procedure $ SLP EVAL LANGUAGE/SOUND PRODUCTION: 1 Procedure $Swallowing Treatment: 1 Procedure $Speech Treatment for Individual: 1 Procedure SLP visit diagnosis: SLP Visit Diagnosis: Dysphagia, oropharyngeal phase (R13.12) Past Medical History: Past Medical History: Diagnosis Date  Diabetes mellitus without complication (HCC)   Gout   Hypertension   Sleep apnea   has CPAP, doesn't use  Ulcer of esophagus without bleeding   Wears dentures   full upper and lower Past Surgical History: Past Surgical History: Procedure Laterality Date  BALLOON DILATION N/A 10/22/2017  Procedure: BALLOON DILATION;  Surgeon: Lucilla Lame, MD;  Location: [place];  Service: Endoscopy;  Laterality: N/A;  CATARACT EXTRACTION W/PHACO Right 02/02/2022  Procedure: CATARACT EXTRACTION PHACO AND INTRAOCULAR LENS PLACEMENT (I[facility]) RIGHT DIABETIC  6.48  00:45.6;  Surgeon: Norvel Richards, MD;  Location: [place];  Service: Ophthalmology;  Laterality: Right;  Diabetic  CATARACT EXTRACTION W/PHACO Left 02/16/2022  Procedure: CATARACT EXTRACTION PHACO AND INTRAOCULAR LENS PLACEMENT ([place]) LEFT DIABTIC  7.58  00:46.2;  Surgeon: Norvel Richards, MD;  Location: [place];  Service: Ophthalmology;  Laterality: Left;  Diabetic  ESOPHAGOGASTRODUODENOSCOPY (EGD) WITH PROPOFOL N/A 10/22/2017  Procedure: ESOPHAGOGASTRODUODENOSCOPY (EGD) WITH Biopsy;  Surgeon: Lucilla Lame, MD;  Location: [place];  Service: Endoscopy;  Laterality: N/A;  IR PERCUTANEOUS ART THROMBECTOMY/INFUSION INTRACRANIAL INC DIAG ANGIO  03/24/2022  RADIOLOGY WITH ANESTHESIA N/A 03/24/2022  Procedure: IR WITH ANESTHESIA;  Surgeon: Radiologist, Medication, MD;  Location: [place];  Service: Radiology;  Laterality: N/A;  TONSILLECTOMY   Houston Siren 03/28/2022, 12:03  PM  DG Abd Portable 1V  Result Date: 03/27/2022 CLINICAL DATA:  Feeding tube placement EXAM: PORTABLE ABDOMEN - 1 VIEW limited for tube placement COMPARISON:  None Available. FINDINGS: Limited x-ray of the upper abdomen has Dobbhoff tube with the tip extending into the right upper quadrant this could be proximal duodenal. There is limited slack in the stomach. Gas is seen in nondilated loops of bowel elsewhere in the visualized upper abdomen. IMPRESSION: Feeding tube in place with tip in the right midabdomen, possibly proximal duodenal. Minimal slack in the stomach Electronically Signed   By: Jill Side M.D.   On: 03/27/2022 14:09    PHYSICAL EXAM  Temp:  [98.3 F (36.8 C)-100.8 F (38.2 C)] 100.8 F (38.2 C) (03/26 1154) Pulse Rate:  [71-96] 72 (03/26 1154) Resp:  [12-26] 18 (03/26 1154) BP: (118-204)/(91-124) 184/107 (03/26 1154) SpO2:  [89 %-98 %] 98 % (03/26 1154)  General critically ill on ventilator with no sedation Cardiovascular -on BiPAP  Mental Status -  Eyes are closed.  Opens eyes to voice and noxious stimuli.  He will intermittently follow commands but inconsistently.Marland Kitchen  Speech is incomprehensible.  Mild dysarthria. Cranial Nerves II - XII - II - Visual field intact blinks to threat only on the left III, IV, VI -slight left gaze preference, eyes do not cross all the way to the right V - Facial sensation intact bilaterally. VII -unable to assess VIII - Hearing & vestibular intact bilaterally. X - Palate elevates symmetrically. XI - Chin turning & shoulder shrug intact bilaterally. XII - Tongue protrusion intact.  Motor Strength -left side is purposeful.  Right side is hemiplegic Motor Tone - Muscle tone was assessed at the neck and appendages and was normal. Sensory -decreased sensation on the  right  Coordination -unable to assess  Gait and Station - deferred.  ASSESSMENT/PLAN Shane Jones is a 77 y.o. male with history of uncontrolled diabetes, sleep apnea,  hypertension who had not been doing well for the past few days and was taken to his primary care's office for management of high blood sugars, was noted to have a sudden onset of bradycardia, unresponsiveness and vomiting at the doctor's office. EMS was called and they noted that he has a leftward gaze preference as well as right hemiplegia, and activated a code stroke and brought him to the ER.   Stroke: Large acute left PCA infarct Etiology: Large vessel disease versus cardiogenic embolism  code Stroke CT head No acute abnormality. ASPECTS 10.    CTA head & neck left P1 patient in severe stenosis of the right P2 Cerebral angio noted left posterior cerebral artery unsuccessful MRI large left acute PCA infarct with mild petechial blood products in left occipital lobe 2D Echo technically difficult study.  Left ventricular ejection fraction 50 to 75%.  Left atrium size is normal   LDL 64 HgbA1c 11.1 VTE prophylaxis -heparin subcu    Diet   DIET - DYS 1 Room service appropriate? No; Fluid consistency: Honey Thick   81 mg prior to admission, now on aspirin 81 mg and Plavix 75 mg.  Therapy recommendations: Pending Disposition: Pending   Hypertension Home meds: Norvasc 10, losartan 100 mg, metoprolol 100 mg Stable Was on Levophed @ 12 mcg, can D/c  BP goal less than 180 Long-term BP goal normotensive  Hyperlipidemia Home meds: Zetia 10 mg, atorvastatin 80 mg, resumed in hospital LDL 64, goal < 70 Continue statin at discharge  Acute hypoxic respiratory failure due to stroke, extubated Possible aspiration pneumonia Management per CCM Antibiotics per CCM Currently on  BiPAP  Dysphagia Consider starting tube feeds if patient not able to be extubated Will need core track for meds and feeds  Diabetes type II UnControlled Home meds: Trulicity, Jardiance 25 mg HgbA1c 11.1, goal < 7.0 CBGs Recent Labs    03/28/22 0402 03/28/22 0810 03/28/22 1141  GLUCAP 392* 198* 291*     SSI Will need diabetes management with PCP after discharge  Other Stroke Risk Factors Advanced Age >/= 41  Cigarette smoker advised to stop smoking Coronary artery disease Obstructive sleep apnea, on CPAP at home Congestive heart failure  Other Active Problems Acute kidney injury-creatinine 1.66 -179 Gout Hypokalemia K 4 .0 replaced will check in the a.m.  Hospital day # 4  Patient has been unable to swallow.  He will need panda tube for nutrition and medicines and possibly PEG tube for long-term if family wants...  Mobilize out of bed.  Therapy consults.  Will likely need rehabilitation  in skilled nursing facility.  Plan to start Seroquel 12.5 mg during morning and 20 5 at night to help with agitation patient's wife feels he would not want that but the daughter's health power of attorney and I have been unable to speak to her yet to discuss goals of care.  Long discussion with family at the bedside and answered questions.    Greater than 50% time during this 35-minute visit was spent in counseling and coordination of care and discussion with patient and care team and answering questions.     Antony Contras, MD Medical Director [facility] Pager: [phone] 03/28/2022 1:40 PM     To contact Stroke Continuity provider, please refer to [email or web address] After hours, contact General Neurology

## 2022-03-28 NOTE — Plan of Care (Signed)
  Problem: Education: Goal: Knowledge of disease or condition will improve Outcome: Progressing   

## 2022-03-28 NOTE — Progress Notes (Signed)
Pt has PRN bipap orders, no distress noted at this time 

## 2022-03-28 NOTE — Progress Notes (Signed)
Modified Barium Swallow Study  Patient Details  Name: Shane Jones MRN: [id number] Date of Birth: [birth year]  Today's Date: 03/28/2022  Modified Barium Swallow completed.  Full report located under Chart Review in the Imaging Section.  History of Present Illness Shane Jones is a 77 y.o. male past medical history of uncontrolled diabetes, sleep apnea, hypertension who had not been doing well for the past few days and was taken to his primary care's office for management of high blood sugars, was noted to have a sudden onset of bradycardia, unresponsiveness and vomiting at the doctor's office. EMS was called and they noted that he has a leftward gaze preference as well as right hemiplegia, and activated a code stroke and brought him to the ER.MRI head revealed on 03/25/22 Large acute left PCA distribution infarct. Associated mild petechial blood products at the left occipital lobe without significant mass effect. SLE/BSE generated to assess swallow and speech/language function.   Clinical Impression Pt exhibited moderate oropharyngeal dysphagia with cues needed to attend due to distractibility and right inattention. There was significantly decreased oral control with bolus spilling under tongue, oral transit  delays and lingual residue with all textures but moderate amount with honey barium. Independent subsequent swallows pt able to reduce resdiue to min-mild. Pharyngeally, there was aspiration with teaspoon thin liquid that spilled from his pyriform sinuses with immediate cough. Cup sip nectar thick aspirated during the swallow due to reduced laryngeal elevation and closure without sensation. Pt was unable to perform compensatory strategies given decreased sustained attention. Majority of pharyngeal residue was in his vallecuale and ranged from min-moderate with spontaneous swallows inconsistently decreasing to mild. Brief esophageal scan was unremarkable for significant findings. Recommend pt initiate  puree (Dys 1), honey thick liquid, check oral cavity for residue and will need FULL supervision to attend to meals and provide cueing, check for  oral clearance. Pills crushed in applesauce. ST to continue. Factors that may increase risk of adverse event in presence of aspiration ([place] 2021): Reduced cognitive function (aphasia)  Swallow Evaluation Recommendations Recommendations: PO diet PO Diet Recommendation: Dysphagia 1 (Pureed);Moderately thick liquids (Level 3, honey thick) Liquid Administration via: Cup Medication Administration: Crushed with puree Supervision: Staff to assist with self-feeding;Full supervision/cueing for swallowing strategies Swallowing strategies  : Slow rate;Small bites/sips;Check for pocketing or oral holding Postural changes: Stay upright 30-60 min after meals Oral care recommendations: Oral care BID (2x/day)      [place] Siren 03/28/2022,12:04 PM

## 2022-03-28 NOTE — Progress Notes (Addendum)
Physical Therapy Treatment Patient Details Name: Shane Jones MRN: [id number] DOB: 19-Aug-1945 Today's Date: 03/28/2022   History of Present Illness 77 year old male admitted to ED on 3/22 for R weakness, L gaze, vomiting at MD office. CT CTA shows P1 occlusion, s/p L vertebral artery arteriogram unsuccessful due to significant tortuosity at aortic arch, L subclavian artery, L vertebral artery. MRI showed large acute left PCA distribution infarct. PMH: DM, gout, HTN, sleep apnea    PT Comments    Pt limited by lethargy and difficulty attending to his R side and following commands today. Due to his impaired cognition, pt was restless and impulsively trying to pull at his lines at times. Pt is requiring max-total assist for bed mobility at this time. He was able to progress to sitting with hands on his lap at a min guard assist level for up to ~5 sec, but then he would tend to lose his balance posteriorly or to the R or even push himself to the R with his L UE, needing mod-maxA to recover. Hopefully pt will begin to be able to participate more actively to benefit from intensive inpt therapy, >3 hours/day. Of note, pt did spontaneously actively move his R shoulder and wiggle his R toes slightly today, which is progress. Will continue to follow acutely.     Recommendations for follow up therapy are one component of a multi-disciplinary discharge planning process, led by the attending physician.  Recommendations may be updated based on patient status, additional functional criteria and insurance authorization.  Follow Up Recommendations       Assistance Recommended at Discharge Frequent or constant Supervision/Assistance  Patient can return home with the following Two people to help with walking and/or transfers;Two people to help with bathing/dressing/bathroom;Assistance with cooking/housework;Direct supervision/assist for medications management;Direct supervision/assist for financial  management;Assist for transportation;Help with stairs or ramp for entrance;Assistance with feeding   Equipment Recommendations  BSC/3in1;Other (comment) (hoyer lift)    Recommendations for Other Services       Precautions / Restrictions Precautions Precautions: Fall Precaution Comments: R inattention vs neglect; L wrist and ankle restraint and L mitten; cortrak Restrictions Weight Bearing Restrictions: No     Mobility  Bed Mobility Overal bed mobility: Needs Assistance Bed Mobility: Rolling, Supine to Sit, Sit to Supine Rolling: Total assist, Max assist   Supine to sit: Total assist, HOB elevated Sit to supine: Total assist, HOB elevated   General bed mobility comments: Pt not following cues to assist with bed mobility except he did grab for the R rail with his L UE once lying on his R side, thus maxA to roll to the R but otherwise TA.    Transfers                   General transfer comment: deferred for safety    Ambulation/Gait               General Gait Details: unable   Stairs             Wheelchair Mobility    Modified Rankin (Stroke Patients Only) Modified Rankin (Stroke Patients Only) Pre-Morbid Rankin Score: No symptoms Modified Rankin: Severe disability     Balance Overall balance assessment: Needs assistance Sitting-balance support: Feet supported, Single extremity supported, No upper extremity supported Sitting balance-Leahy Scale: Poor Sitting balance - Comments: Pt initially leaning to his R and even pushed himself to his R with his L UE on the bed 1x, needing maxA to recover. When  L hand was on his lap he was able to eventually progress to min guard assist for up to ~5 sec at a time before he would have a R lateral or posterior LOB and need mod-maxA to recover again Postural control: Right lateral lean     Standing balance comment: deferred                            Cognition Arousal/Alertness:  Lethargic Behavior During Therapy: Restless, Impulsive, Flat affect Overall Cognitive Status: Impaired/Different from baseline Area of Impairment: Attention, Following commands, Awareness                   Current Attention Level: Focused   Following Commands: Follows one step commands with increased time, Follows one step commands inconsistently (0%)   Awareness: Intellectual   General Comments: does not follow commands (squeeze hand, mobility cues, look up), inattentive vs neglect R side. No attempts to verbally communicate today. Pt did open eyes and look at therapist when on his L, but otherwise did not track or try to find therapist to his R. Pt restless and impulsive, trying to pull at his lines        Exercises      General Comments        Pertinent Vitals/Pain Pain Assessment Pain Assessment: Faces Faces Pain Scale: No hurt Pain Intervention(s): Monitored during session    Home Living                          Prior Function            PT Goals (current goals can now be found in the care plan section) Acute Rehab PT Goals Patient Stated Goal: did not state PT Goal Formulation: Patient unable to participate in goal setting Time For Goal Achievement: 04/10/22 Potential to Achieve Goals: Good Progress towards PT goals: Progressing toward goals    Frequency    Min 4X/week      PT Plan Equipment recommendations need to be updated    Co-evaluation              AM-PAC PT "6 Clicks" Mobility   Outcome Measure  Help needed turning from your back to your side while in a flat bed without using bedrails?: Total Help needed moving from lying on your back to sitting on the side of a flat bed without using bedrails?: Total Help needed moving to and from a bed to a chair (including a wheelchair)?: Total Help needed standing up from a chair using your arms (e.g., wheelchair or bedside chair)?: Total Help needed to walk in hospital room?:  Total Help needed climbing 3-5 steps with a railing? : Total 6 Click Score: 6    End of Session   Activity Tolerance: Patient limited by lethargy Patient left: in bed;with call bell/phone within reach;with bed alarm set;with restraints reapplied Nurse Communication: Mobility status;Other (comment) (redness at sacrum, sacral foam added by PT) PT Visit Diagnosis: Hemiplegia and hemiparesis;Other abnormalities of gait and mobility (R26.89);Unsteadiness on feet (R26.81);Muscle weakness (generalized) (M62.81);Difficulty in walking, not elsewhere classified (R26.2);Other symptoms and signs involving the nervous system (R29.898) Hemiplegia - Right/Left: Right Hemiplegia - dominant/non-dominant: Dominant Hemiplegia - caused by: Cerebral infarction     Time: [id number] PT Time Calculation (min) (ACUTE ONLY): 28 min  Charges:  $Therapeutic Activity: 23-37 mins  Moishe Spice, PT, DPT Acute Rehabilitation Services  Office: [place] 03/28/2022, 5:28 PM

## 2022-03-28 NOTE — Progress Notes (Signed)
Patient SBP in the 190s, on-call notified but no new order. We continue to monitor.

## 2022-03-29 DIAGNOSIS — I639 Cerebral infarction, unspecified: Secondary | ICD-10-CM | POA: Diagnosis not present

## 2022-03-29 LAB — BASIC METABOLIC PANEL
Anion gap: 11 (ref 5–15)
BUN: 49 mg/dL — ABNORMAL HIGH (ref 8–23)
CO2: 28 mmol/L (ref 22–32)
Calcium: 9.4 mg/dL (ref 8.9–10.3)
Chloride: 110 mmol/L (ref 98–111)
Creatinine, Ser: 1.99 mg/dL — ABNORMAL HIGH (ref 0.61–1.24)
GFR, Estimated: 34 mL/min — ABNORMAL LOW (ref >60)
Glucose, Bld: 231 mg/dL — ABNORMAL HIGH (ref 70–99)
Potassium: 3.4 mmol/L — ABNORMAL LOW (ref 3.5–5.1)
Sodium: 149 mmol/L — ABNORMAL HIGH (ref 135–145)

## 2022-03-29 LAB — GLUCOSE, CAPILLARY
Glucose-Capillary: 207 mg/dL — ABNORMAL HIGH (ref 70–99)
Glucose-Capillary: 244 mg/dL — ABNORMAL HIGH (ref 70–99)
Glucose-Capillary: 245 mg/dL — ABNORMAL HIGH (ref 70–99)
Glucose-Capillary: 265 mg/dL — ABNORMAL HIGH (ref 70–99)
Glucose-Capillary: 282 mg/dL — ABNORMAL HIGH (ref 70–99)
Glucose-Capillary: 299 mg/dL — ABNORMAL HIGH (ref 70–99)
Glucose-Capillary: 394 mg/dL — ABNORMAL HIGH (ref 70–99)
Glucose-Capillary: 425 mg/dL — ABNORMAL HIGH (ref 70–99)

## 2022-03-29 LAB — CBC
HCT: 46.6 % (ref 39.0–52.0)
Hemoglobin: 16.8 g/dL (ref 13.0–17.0)
MCH: 33.1 pg (ref 26.0–34.0)
MCHC: 36.1 g/dL — ABNORMAL HIGH (ref 30.0–36.0)
MCV: 91.9 fL (ref 80.0–100.0)
Platelets: 215 10*3/uL (ref 150–400)
RBC: 5.07 MIL/uL (ref 4.22–5.81)
RDW: 13.1 % (ref 11.5–15.5)
WBC: 9.4 10*3/uL (ref 4.0–10.5)
nRBC: 0 % (ref 0.0–0.2)

## 2022-03-29 LAB — MAGNESIUM: Magnesium: 2.6 mg/dL — ABNORMAL HIGH (ref 1.7–2.4)

## 2022-03-29 LAB — PHOSPHORUS: Phosphorus: 3.1 mg/dL (ref 2.5–4.6)

## 2022-03-29 MED ORDER — FREE WATER
100.0000 mL | Status: DC
  Administered 2022-03-29 – 2022-03-30 (×6): 100 mL

## 2022-03-29 MED ORDER — INSULIN ASPART 100 UNIT/ML IJ SOLN
20.0000 [IU] | Freq: Once | INTRAMUSCULAR | Status: AC
  Administered 2022-03-29: 20 [IU] via SUBCUTANEOUS

## 2022-03-29 MED ORDER — POTASSIUM CHLORIDE 20 MEQ PO PACK
40.0000 meq | PACK | Freq: Two times a day (BID) | ORAL | Status: AC
  Administered 2022-03-29 (×2): 40 meq via ORAL
  Filled 2022-03-29 (×2): qty 2

## 2022-03-29 NOTE — Progress Notes (Signed)
Speech Language Pathology Treatment: Dysphagia  Patient Details Name: Shane Jones MRN: [id number] DOB: 01-02-1946 Today's Date: 03/29/2022 Time: [id number] SLP Time Calculation (min) (ACUTE ONLY): 14 min  Assessment / Plan / Recommendation Clinical Impression  Pt seen for dysphagia intervention after MBS yesterday recommending puree and honey thick liquids, Pt started on Seroquel and is sleepier this morning but able to rouse for po's, keeps eyes closed and needs intermittent verbal stimuli for awareness to eating. Consumed puree pancake, sausage and cup sips honey thick liquids with slightly extra time to transit but functional, minimal and occasional anterior spill. There was no oral residue noted and no signs of aspiration although risk increases with lethargy. Recommend continue puree (Dys 1), honey thick liquids with FULL assist with meals. He did not follow commands, no spontaneous vocalizations nor did he repeat words this session. ST to continue.    HPI HPI: CLOISE CAMBARERI is a 77 y.o. male past medical history of uncontrolled diabetes, sleep apnea, hypertension who had not been doing well for the past few days and was taken to his primary care's office for management of high blood sugars, was noted to have a sudden onset of bradycardia, unresponsiveness and vomiting at the doctor's office. EMS was called and they noted that he has a leftward gaze preference as well as right hemiplegia, and activated a code stroke and brought him to the ER.MRI head revealed on 03/25/22 Large acute left PCA distribution infarct. Associated mild petechial blood products at the left occipital lobe without significant mass effect. SLE/BSE generated to assess swallow and speech/language function.      SLP Plan  Continue with current plan of care;MBS      Recommendations for follow up therapy are one component of a multi-disciplinary discharge planning process, led by the attending physician.  Recommendations may be  updated based on patient status, additional functional criteria and insurance authorization.    Recommendations  Diet recommendations: Dysphagia 1 (puree);Honey-thick liquid Liquids provided via: Cup Medication Administration: Crushed with puree Supervision: Full supervision/cueing for compensatory strategies;Staff to assist with self feeding Compensations: Minimize environmental distractions;Slow rate;Small sips/bites;Lingual sweep for clearance of pocketing Postural Changes and/or Swallow Maneuvers: Seated upright 90 degrees                  Oral care BID   Frequent or constant Supervision/Assistance Dysphagia, oropharyngeal phase (R13.12) Cerebral infarction   Continue with current plan of care;MBS     Houston Siren  03/29/2022, 9:46 AM

## 2022-03-29 NOTE — Inpatient Diabetes Management (Signed)
Inpatient Diabetes Program Recommendations  AACE/ADA: New Consensus Statement on Inpatient Glycemic Control (2015)  Target Ranges:  Prepandial:   less than 140 mg/dL      Peak postprandial:   less than 180 mg/dL (1-2 hours)      Critically ill patients:  140 - 180 mg/dL   Lab Results  Component Value Date   GLUCAP 425 (H) 03/29/2022   HGBA1C 11.1 (A) 03/24/2022    Review of Glycemic Control  Diabetes history: DM2 Outpatient Diabetes medications: Basaglar 30 QD, Jardiance 25 mg QD, Trulicity 4.5 mg weekly Current orders for Inpatient glycemic control: Novolog 0-15 Q4H Osmolite 1.5 cal 50/H  HgbA1C - 11.1% 244, 425 mg/dL  Will need TF insulin coverage.  Inpatient Diabetes Program Recommendations:    Consider adding Novolog 3 units Q4H for TF coverage  Consider adding Semglee 8 units QD  Will continue to follow.  Thank you. Lorenda Peck, RD, LDN, [place] Inpatient Diabetes Coordinator [phone]

## 2022-03-29 NOTE — Progress Notes (Addendum)
She STROKE TEAM PROGRESS NOTE   INTERVAL HISTORY No family at the bedside.  Neurological exam is unchanged and stable with right hemiplegia and left gaze preference K is 3.4 will replace and check in the morning.  Serum creatinine is 1.99-will add free water flushes at 100 cc every 4 hours Vital signs are stable Glucose has been quite elevated 200s to 400s-will consult diabetes team for assistance Patient will need SNF and PEG placement if family wishes to pursue however unable to get hold of family at this time  Vitals:   03/29/22 0344 03/29/22 0700 03/29/22 0714 03/29/22 1102  BP: (!) 181/105 (!) 186/120  (!) 176/115  Pulse: 88 90  71  Resp: 17 20  (!) 24  Temp: 98 F (36.7 C) 99.6 F (37.6 C)  98.9 F (37.2 C)  TempSrc:  Axillary  Oral  SpO2: 95% 99%  95%  Weight:   77.8 kg   Height:       CBC:  Recent Labs  Lab 03/24/22 1226 03/24/22 1235 03/25/22 0339 03/26/22 1421 03/28/22 1203 03/29/22 0408  WBC 8.3  --  10.3   < > 7.8 9.4  NEUTROABS 4.0  --  8.2*  --   --   --   HGB 14.9   < > 13.7   < > 16.1 16.8  HCT 41.6   < > 37.6*   < > 46.9 46.6  MCV 89.5  --  89.5   < > 93.6 91.9  PLT 184  --  203   < > 215 215   < > = values in this interval not displayed.    Basic Metabolic Panel:  Recent Labs  Lab 03/28/22 0728 03/29/22 0408  NA 146* 149*  K 3.4* 3.4*  CL 105 110  CO2 26 28  GLUCOSE 149* 231*  BUN 35* 49*  CREATININE 1.78* 1.99*  CALCIUM 9.7 9.4  MG 2.7* 2.6*  PHOS 2.5 3.1    Lipid Panel:  Recent Labs  Lab 03/25/22 0339  CHOL 128  TRIG 179*  HDL 28*  CHOLHDL 4.6  VLDL 36  LDLCALC 64    HgbA1c:  Recent Labs  Lab 03/24/22 1213  HGBA1C 11.1*    Urine Drug Screen:  Recent Labs  Lab 03/25/22 1224  LABOPIA NONE DETECTED  COCAINSCRNUR NONE DETECTED  LABBENZ POSITIVE*  AMPHETMU NONE DETECTED  THCU NONE DETECTED  LABBARB NONE DETECTED     Alcohol Level  Recent Labs  Lab 03/24/22 1522  ETH <10     IMAGING past 24 hours No  results found.  PHYSICAL EXAM  Temp:  [98 F (36.7 C)-99.9 F (37.7 C)] 98.9 F (37.2 C) (03/27 1102) Pulse Rate:  [71-91] 71 (03/27 1102) Resp:  [17-24] 24 (03/27 1102) BP: (145-186)/(90-120) 176/115 (03/27 1102) SpO2:  [94 %-99 %] 95 % (03/27 1102) Weight:  [77.8 kg] 77.8 kg (03/27 0714)  General critically ill in no distress  Mental Status -  Seems pretty lethargic today, exam is unchanged eyes are closed.  Opens eyes to voice and noxious stimuli.  Globally aphasic he will intermittently follow commands but inconsistently.Marland Kitchen  Speech is incomprehensible.  Mild dysarthria. Cranial Nerves II - XII - II - Visual field intact blinks to threat only on the left III, IV, VI -slight left gaze preference, eyes do not cross all the way to the right V - Facial sensation intact bilaterally. VII -unable to assess VIII - Hearing & vestibular intact bilaterally. X - Palate elevates  symmetrically. XI - Chin turning & shoulder shrug intact bilaterally. XII - Tongue protrusion intact.  Motor Strength -left side is purposeful.  Right side is hemiplegic Motor Tone - Muscle tone was assessed at the neck and appendages and was normal. Sensory -decreased sensation on the  right  Coordination -unable to assess  Gait and Station - deferred.  ASSESSMENT/PLAN [name] is a 77 y.o. male with history of uncontrolled diabetes, sleep apnea, hypertension who had not been doing well for the past few days and was taken to his primary care's office for management of high blood sugars, was noted to have a sudden onset of bradycardia, unresponsiveness and vomiting at the doctor's office. EMS was called and they noted that he has a leftward gaze preference as well as right hemiplegia, and activated a code stroke and brought him to the ER.   Stroke: Large acute left PCA infarct Etiology: Large vessel disease versus cardiogenic embolism  code Stroke CT head No acute abnormality. ASPECTS 10.    CTA head &  neck left P1 patient in severe stenosis of the right P2 Cerebral angio noted left posterior cerebral artery unsuccessful MRI large left acute PCA infarct with mild petechial blood products in left occipital lobe 2D Echo technically difficult study.  Left ventricular ejection fraction 50 to 75%.  Left atrium size is normal   LDL 64 HgbA1c 11.1 VTE prophylaxis -heparin subcu    Diet   DIET - DYS 1 Room service appropriate? No; Fluid consistency: Honey Thick   81 mg prior to admission, now on aspirin 81 mg and Plavix 75 mg.  Therapy recommendations: Pending Disposition: Pending   Hypertension Home meds: Norvasc 10, losartan 100 mg, metoprolol 100 mg Stable Levophed currently off BP goal less than 180 Long-term BP goal normotensive  Hyperlipidemia Home meds: Zetia 10 mg, atorvastatin 80 mg, resumed in hospital LDL 64, goal < 70 Continue statin at discharge  Acute hypoxic respiratory failure due to stroke, extubated Possible aspiration pneumonia Management per CCM Antibiotics per CCM  BiPAP  Agitation/delirium Continue Seroquel Delirium precautions  Dysphagia Consider starting tube feeds if patient not able to be extubated core track for meds and feeds  Diabetes type II UnControlled Home meds: Trulicity, Jardiance 25 mg HgbA1c 11.1, goal < 7.0 CBGs Recent Labs    03/29/22 0339 03/29/22 0748 03/29/22 1207  GLUCAP 245* 244* 425*     [facility] Diabetes management team to help.  Appreciate their assistance Will need diabetes management with PCP after discharge  Other Stroke Risk Factors Advanced Age >/= 54  Cigarette smoker advised to stop smoking Coronary artery disease Obstructive sleep apnea, on CPAP at home Congestive heart failure  Other Active Problems Acute kidney injury-creatinine 1.66 -179-1.99.  Add free water flushes 100 cc every 4 hours.  Check in the a.m. Gout Hypokalemia K 3.4 replaced will check in the a.m.  Hospital day # [place],  ACNPC-AG  [facility]  I have personally obtained history,examined this patient, reviewed notes, independently viewed imaging studies, participated in medical decision making and plan of care.ROS completed by me personally and pertinent positives fully documented  I have made any additions or clarifications directly to the above note. Agree with note above.  Patient neurological exam remains unchanged and has not made significant improvement.  Continues to have dysphagia and is on tube feeds.  May need to consider PEG tube and nursing home in a few days if there is no meaningful improvement.  No family available for discussion at the bedside but will need to have goals of care discussion with them soon.  I spoke to the patient's daughter [name] over the phone and she and her brother plan to come this afternoon per discussion with me.  Patient was started on Seroquel yesterday for agitation and appears very sleepy from that this morning. Greater than 50% time during this 35-minute visit spent on counseling and coordination of care about his stroke and discussion with patient and family care team and answering questions Antony Contras, [place] Pager: [phone] 03/29/2022 2:50 PM  ADDENDUM ; I met with patient's daughter and son at the bedside and discussed his prognosis and disability from his large stroke.  Patient clearly will be unable to live by himself and will need prolonged rehabilitation and 24-hour nursing care likely in a skilled nursing facility After a PEG tube.  They want to talk to palliative care and discuss goals of care. Antony Contras MD To contact Stroke Continuity provider, please refer to [email or web address] After hours, contact General Neurology

## 2022-03-29 NOTE — Progress Notes (Signed)
I was notified that the patient was more lethargic than normal and not agitated as his baseline recently. He received Seroquel 12.5mg  around 5pm and 25mg  at 9 pm. He will respond to noxious stimuli but quickly goes back to sleep.   0344-98.83F, HR 127, 181/105, RR 17 and sats 95% on RA. CBG 245

## 2022-03-29 NOTE — Progress Notes (Signed)
Inpatient Rehab Admissions Coordinator:   Pt continues to require total +2 for all mobility.  Recommend TOC investigate other rehab venues at this time.   Shann Medal, PT, DPT Admissions Coordinator [phone] 03/29/22  4:50 PM

## 2022-03-29 NOTE — Progress Notes (Signed)
Occupational Therapy Treatment Patient Details Name: Shane Jones MRN: [id number] DOB: 07/10/1945 Today's Date: 03/29/2022   History of present illness 77 year old male admitted to ED on 3/22 for R weakness, L gaze, vomiting at MD office. CT CTA shows P1 occlusion, s/p L vertebral artery arteriogram unsuccessful due to significant tortuosity at aortic arch, L subclavian artery, L vertebral artery. MRI showed large acute left PCA distribution infarct. PMH: DM, gout, HTN, sleep apnea   OT comments  Patient supine in bed and lethargic today.  Does not open eyes to external or tactile stimuli, movement or sitting EOB.  He is incontinent of bowel upon entry, total assist +2 for hygiene and linen change. Total assist to maintain sitting EOB, no righting reactions to loss of balance but worked on trunk rotations, weightbearing into forearms and tolerance.  Pt able to initiate pushing self back to midline on L UE but not R UE.  Need to monitor R hand for resting hand splint.  Pt not following commands today.  OT will continue to follow and will re-assess goals next session.    Recommendations for follow up therapy are one component of a multi-disciplinary discharge planning process, led by the attending physician.  Recommendations may be updated based on patient status, additional functional criteria and insurance authorization.    Assistance Recommended at Discharge Frequent or constant Supervision/Assistance  Patient can return home with the following  A lot of help with walking and/or transfers;Two people to help with walking and/or transfers;A lot of help with bathing/dressing/bathroom;Two people to help with bathing/dressing/bathroom;Assist for transportation;Help with stairs or ramp for entrance   Equipment Recommendations  Other (comment) (defer)    Recommendations for Other Services Rehab consult    Precautions / Restrictions Precautions Precautions: Fall Precaution Comments: R inattention vs  neglect; L wrist and ankle restraint and L mitten; cortrak Restrictions Weight Bearing Restrictions: No       Mobility Bed Mobility Overal bed mobility: Needs Assistance Bed Mobility: Rolling, Sidelying to Sit, Sit to Supine Rolling: Total assist, +2 for physical assistance, +2 for safety/equipment   Supine to sit: Total assist, +2 for physical assistance, +2 for safety/equipment Sit to supine: Total assist, +2 for physical assistance, +2 for safety/equipment   General bed mobility comments: not following commands to engage in bed mobility, +2 total assist    Transfers Overall transfer level: Needs assistance   Transfers: Bed to chair/wheelchair/BSC            Lateral/Scoot Transfers: Total assist, +2 physical assistance, +2 safety/equipment General transfer comment: for lateral scoot to head of bed with total assist +2, no intiation in task     Balance Overall balance assessment: Needs assistance Sitting-balance support: Feet supported, Single extremity supported Sitting balance-Leahy Scale: Zero Sitting balance - Comments: pt relies on external support, worked on lateral leans with pt initating pushing self up towards midline with LUE but not R UE. He does not self correct or show righting reactions when loosing balance at EOB. Postural control: Right lateral lean, Posterior lean                                 ADL either performed or assessed with clinical judgement   ADL Overall ADL's : Needs assistance/impaired Eating/Feeding: NPO   Grooming: Wash/dry face;Sitting;Total assistance               Lower Body Dressing: Total assistance;+2 for physical assistance;+2 for  safety/equipment;Sitting/lateral leans;Bed level       Toileting- Clothing Manipulation and Hygiene: Total assistance;+2 for physical assistance;+2 for safety/equipment;Bed level Toileting - Clothing Manipulation Details (indicate cue type and reason): incontinent of bowel, total  assist from bed levle +2     Functional mobility during ADLs: Total assistance;+2 for physical assistance;+2 for safety/equipment      Extremity/Trunk Assessment              Vision   Additional Comments: eyes remained closed during session, continue assessment   Perception     Praxis      Cognition Arousal/Alertness: Lethargic Behavior During Therapy: Restless, Impulsive, Flat affect Overall Cognitive Status: Difficult to assess                                 General Comments: pt does not follow commands, lethargic and does not volitionally open eyes during session.  He does withdrawl to noxious stimuli on R UE and at times will reposition self when uncomfortable but otherwise does not engage with thearpists.        Exercises      Shoulder Instructions       General Comments VSS on RA, need to monitor for resting hand splint to R hand    Pertinent Vitals/ Pain       Pain Assessment Pain Assessment: Faces Faces Pain Scale: No hurt Pain Intervention(s): Monitored during session  Home Living                                          Prior Functioning/Environment              Frequency  Min 2X/week        Progress Toward Goals  OT Goals(current goals can now be found in the care plan section)  Progress towards OT goals: OT to reassess next treatment (lethargy limiting engagement today)  Acute Rehab OT Goals Patient Stated Goal: none stated OT Goal Formulation: Patient unable to participate in goal setting Time For Goal Achievement: 04/10/22 Potential to Achieve Goals: Good  Plan Discharge plan remains appropriate;Frequency remains appropriate    Co-evaluation    PT/OT/SLP Co-Evaluation/Treatment: Yes Reason for Co-Treatment: For patient/therapist safety;To address functional/ADL transfers;Necessary to address cognition/behavior during functional activity   OT goals addressed during session: ADL's and  self-care      AM-PAC OT "6 Clicks" Daily Activity     Outcome Measure   Help from another person eating meals?: Total Help from another person taking care of personal grooming?: Total Help from another person toileting, which includes using toliet, bedpan, or urinal?: Total Help from another person bathing (including washing, rinsing, drying)?: Total Help from another person to put on and taking off regular upper body clothing?: Total Help from another person to put on and taking off regular lower body clothing?: Total 6 Click Score: 6    End of Session    OT Visit Diagnosis: Unsteadiness on feet (R26.81);Other abnormalities of gait and mobility (R26.89);Muscle weakness (generalized) (M62.81);Cognitive communication deficit (R41.841);Hemiplegia and hemiparesis Symptoms and signs involving cognitive functions: Cerebral infarction Hemiplegia - Right/Left: Right Hemiplegia - dominant/non-dominant: Dominant Hemiplegia - caused by: Cerebral infarction   Activity Tolerance Patient limited by lethargy   Patient Left in bed;with call bell/phone within reach;with bed alarm set;with family/visitor present;with restraints reapplied  Nurse Communication Mobility status        Time: [id number] OT Time Calculation (min): 31 min  Charges: OT General Charges $OT Visit: 1 Visit OT Treatments $Self Care/Home Management : 8-22 mins  Jolaine Artist, OT Acute Rehabilitation Services Office [phone]   Delight Stare 03/29/2022, 2:06 PM

## 2022-03-29 NOTE — Plan of Care (Signed)
  Problem: Education: Goal: Knowledge of disease or condition will improve Outcome: Progressing   

## 2022-03-29 NOTE — Progress Notes (Signed)
Patient more lethargic than normal, not able to respond to voice command and will briefly respond to sternal rub and go back to sleep, both MD and RR paged. Both the MD and RR confirmed that patient is sleeping because of the Seroquel and that he will be fine. We will continue to monitor.

## 2022-03-29 NOTE — Progress Notes (Signed)
Physical Therapy Treatment Patient Details Name: Shane Jones MRN: [id number] DOB: September 24, 1945 Today's Date: 03/29/2022   History of Present Illness 77 year old male admitted to ED on 3/22 for R weakness, L gaze, vomiting at MD office. CT CTA shows P1 occlusion, s/p L vertebral artery arteriogram unsuccessful due to significant tortuosity at aortic arch, L subclavian artery, L vertebral artery. MRI showed large acute left PCA distribution infarct. PMH: DM, gout, HTN, sleep apnea    PT Comments    Pt continues to be limited by lethargy, not following any commands this date. Noted spontaneous wiggling of his R toes and withdrawal to noxious stimuli at his R extremities, but no purposeful active movement to follow commands. Pt kept his eyes closed this session and displayed no reactional strategies with LOB while sitting EOB, reliant on physical assist for all functional mobility and balance today. He remains restless and impulsive, reaching for his cortrak and needing blocking at times. Will continue to follow acutely.     Recommendations for follow up therapy are one component of a multi-disciplinary discharge planning process, led by the attending physician.  Recommendations may be updated based on patient status, additional functional criteria and insurance authorization.  Follow Up Recommendations       Assistance Recommended at Discharge Frequent or constant Supervision/Assistance  Patient can return home with the following Two people to help with walking and/or transfers;Two people to help with bathing/dressing/bathroom;Assistance with cooking/housework;Direct supervision/assist for medications management;Direct supervision/assist for financial management;Assist for transportation;Help with stairs or ramp for entrance;Assistance with feeding   Equipment Recommendations  BSC/3in1;Other (comment) (hoyer lift)    Recommendations for Other Services       Precautions / Restrictions  Precautions Precautions: Fall Precaution Comments: R inattention vs neglect; L wrist and ankle restraint and L mitten; cortrak Restrictions Weight Bearing Restrictions: No     Mobility  Bed Mobility Overal bed mobility: Needs Assistance Bed Mobility: Rolling, Sidelying to Sit, Sit to Supine Rolling: Total assist, +2 for physical assistance, +2 for safety/equipment   Supine to sit: Total assist, +2 for physical assistance, +2 for safety/equipment Sit to supine: Total assist, +2 for physical assistance, +2 for safety/equipment   General bed mobility comments: not following commands to engage in bed mobility, +2 total assist    Transfers Overall transfer level: Needs assistance   Transfers: Bed to chair/wheelchair/BSC            Lateral/Scoot Transfers: Total assist, +2 physical assistance, +2 safety/equipment General transfer comment: for lateral scoot to head of bed with total assist +2, no intiation in task    Ambulation/Gait               General Gait Details: unable   Stairs             Wheelchair Mobility    Modified Rankin (Stroke Patients Only) Modified Rankin (Stroke Patients Only) Pre-Morbid Rankin Score: No symptoms Modified Rankin: Severe disability     Balance Overall balance assessment: Needs assistance Sitting-balance support: Feet supported, Single extremity supported Sitting balance-Leahy Scale: Zero Sitting balance - Comments: pt relies on external support, worked on lateral leans with pt initating pushing self up towards midline with LUE but not R UE. He does not self correct or show righting reactions when losing balance at EOB. Postural control: Right lateral lean, Posterior lean     Standing balance comment: deferred  Cognition Arousal/Alertness: Lethargic Behavior During Therapy: Restless, Impulsive, Flat affect Overall Cognitive Status: Difficult to assess                                  General Comments: pt does not follow commands, lethargic and does not volitionally open eyes during session.  He does withdraw to noxious stimuli on R UE/LE and at times will reposition self when uncomfortable but otherwise does not engage with therapists.        Exercises Other Exercises Other Exercises: gentle PROM to neck into R lateral flexion and rotation Other Exercises: PROM to trunk into rotation bil while sitting EOB Other Exercises: propped pt onto either elbow several times while sitting EOB, pt occasionally pushing up with L UE towards midline when uncomfortable but not with R UE    General Comments General comments (skin integrity, edema, etc.): VSS on RA, need to monitor for resting hand splint to R hand      Pertinent Vitals/Pain Pain Assessment Pain Assessment: Faces Faces Pain Scale: No hurt Pain Intervention(s): Monitored during session    Home Living                          Prior Function            PT Goals (current goals can now be found in the care plan section) Acute Rehab PT Goals Patient Stated Goal: did not state PT Goal Formulation: Patient unable to participate in goal setting Time For Goal Achievement: 04/10/22 Potential to Achieve Goals: Fair Progress towards PT goals: Not progressing toward goals - comment (limited by lethargy)    Frequency    Min 4X/week      PT Plan Current plan remains appropriate    Co-evaluation PT/OT/SLP Co-Evaluation/Treatment: Yes Reason for Co-Treatment: For patient/therapist safety;To address functional/ADL transfers;Necessary to address cognition/behavior during functional activity PT goals addressed during session: Mobility/safety with mobility;Balance OT goals addressed during session: ADL's and self-care      AM-PAC PT "6 Clicks" Mobility   Outcome Measure  Help needed turning from your back to your side while in a flat bed without using bedrails?: Total Help needed  moving from lying on your back to sitting on the side of a flat bed without using bedrails?: Total Help needed moving to and from a bed to a chair (including a wheelchair)?: Total Help needed standing up from a chair using your arms (e.g., wheelchair or bedside chair)?: Total Help needed to walk in hospital room?: Total Help needed climbing 3-5 steps with a railing? : Total 6 Click Score: 6    End of Session   Activity Tolerance: Patient limited by lethargy Patient left: in bed;with call bell/phone within reach;with bed alarm set;with restraints reapplied Nurse Communication: Mobility status (NT) PT Visit Diagnosis: Hemiplegia and hemiparesis;Other abnormalities of gait and mobility (R26.89);Unsteadiness on feet (R26.81);Muscle weakness (generalized) (M62.81);Difficulty in walking, not elsewhere classified (R26.2);Other symptoms and signs involving the nervous system (R29.898) Hemiplegia - Right/Left: Right Hemiplegia - dominant/non-dominant: Dominant Hemiplegia - caused by: Cerebral infarction     Time: 1157-1228 PT Time Calculation (min) (ACUTE ONLY): 31 min  Charges:  $Therapeutic Activity: 8-22 mins                     Moishe Spice, PT, DPT Acute Rehabilitation Services  Office: [place] 03/29/2022, 2:34 PM

## 2022-03-30 ENCOUNTER — Inpatient Hospital Stay (HOSPITAL_COMMUNITY): Payer: PPO

## 2022-03-30 DIAGNOSIS — Z515 Encounter for palliative care: Secondary | ICD-10-CM

## 2022-03-30 DIAGNOSIS — N179 Acute kidney failure, unspecified: Secondary | ICD-10-CM | POA: Diagnosis not present

## 2022-03-30 DIAGNOSIS — Z7189 Other specified counseling: Secondary | ICD-10-CM

## 2022-03-30 DIAGNOSIS — I639 Cerebral infarction, unspecified: Secondary | ICD-10-CM | POA: Diagnosis not present

## 2022-03-30 LAB — CBC
HCT: 52.8 % — ABNORMAL HIGH (ref 39.0–52.0)
Hemoglobin: 17.4 g/dL — ABNORMAL HIGH (ref 13.0–17.0)
MCH: 31.7 pg (ref 26.0–34.0)
MCHC: 33 g/dL (ref 30.0–36.0)
MCV: 96.2 fL (ref 80.0–100.0)
Platelets: 231 10*3/uL (ref 150–400)
RBC: 5.49 MIL/uL (ref 4.22–5.81)
RDW: 13.4 % (ref 11.5–15.5)
WBC: 11.9 10*3/uL — ABNORMAL HIGH (ref 4.0–10.5)
nRBC: 0 % (ref 0.0–0.2)

## 2022-03-30 LAB — GLUCOSE, CAPILLARY
Glucose-Capillary: 293 mg/dL — ABNORMAL HIGH (ref 70–99)
Glucose-Capillary: 365 mg/dL — ABNORMAL HIGH (ref 70–99)
Glucose-Capillary: 384 mg/dL — ABNORMAL HIGH (ref 70–99)
Glucose-Capillary: 289 mg/dL — ABNORMAL HIGH (ref 70–99)
Glucose-Capillary: 222 mg/dL — ABNORMAL HIGH (ref 70–99)
Glucose-Capillary: 198 mg/dL — ABNORMAL HIGH (ref 70–99)

## 2022-03-30 LAB — BASIC METABOLIC PANEL
Anion gap: 12 (ref 5–15)
BUN: 59 mg/dL — ABNORMAL HIGH (ref 8–23)
CO2: 24 mmol/L (ref 22–32)
Calcium: 9.3 mg/dL (ref 8.9–10.3)
Chloride: 117 mmol/L — ABNORMAL HIGH (ref 98–111)
Creatinine, Ser: 2.25 mg/dL — ABNORMAL HIGH (ref 0.61–1.24)
GFR, Estimated: 29 mL/min — ABNORMAL LOW (ref >60)
Glucose, Bld: 343 mg/dL — ABNORMAL HIGH (ref 70–99)
Potassium: 4.4 mmol/L (ref 3.5–5.1)
Sodium: 153 mmol/L — ABNORMAL HIGH (ref 135–145)

## 2022-03-30 LAB — MAGNESIUM: Magnesium: 2.9 mg/dL — ABNORMAL HIGH (ref 1.7–2.4)

## 2022-03-30 LAB — PHOSPHORUS: Phosphorus: 2.7 mg/dL (ref 2.5–4.6)

## 2022-03-30 MED ORDER — INSULIN ASPART 100 UNIT/ML IJ SOLN
3.0000 [IU] | Freq: Four times a day (QID) | INTRAMUSCULAR | Status: DC
  Administered 2022-03-30 – 2022-03-31 (×4): 3 [IU] via SUBCUTANEOUS

## 2022-03-30 MED ORDER — INSULIN GLARGINE-YFGN 100 UNIT/ML ~~LOC~~ SOLN
8.0000 [IU] | Freq: Every day | SUBCUTANEOUS | Status: DC
  Administered 2022-03-30 – 2022-04-01 (×3): 8 [IU] via SUBCUTANEOUS
  Filled 2022-03-30 (×3): qty 0.08

## 2022-03-30 MED ORDER — GLUCERNA 1.5 CAL PO LIQD
1000.0000 mL | ORAL | Status: DC
  Administered 2022-03-30 – 2022-03-31 (×2): 1000 mL
  Filled 2022-03-30 (×4): qty 1000

## 2022-03-30 MED ORDER — QUETIAPINE FUMARATE 25 MG PO TABS: 12.5000 mg | ORAL_TABLET | Freq: Every day | ORAL | Status: DC

## 2022-03-30 MED ORDER — FREE WATER
200.0000 mL | Status: DC
  Administered 2022-03-30 – 2022-04-01 (×10): 200 mL

## 2022-03-30 NOTE — Progress Notes (Signed)
RN requesting evaluation or patient being unresponsive. Went to the bedside, patient is hard to arouse, grimaces and opens eyes partially to qtip in the nose and closes eyes tight shut when assessing corneals. He had CT Head earlier for the same which was negative for any acute abnormality, CXR with minimal LEFT basilar atelectasis without infiltrate. Daughter is at bedside and reports that he has become somnolent since he was started on sedating meds Monday evening.  Plan: - will discontiue Seroquel and Gabapentin given significant somnolence.  [place] Pager Number [phone]

## 2022-03-30 NOTE — Significant Event (Signed)
Patient is no longer responding to sternal rub. Only minimal response is to suctioning by moving his left arm forward, have been placed on 4L [place] as he desats to the 85%. RN suctioned several times as patient has copious secretions pooled in his mouth. . Dr. Leonie Man called and came to bedside. New orders entered into EPIC.

## 2022-03-30 NOTE — Progress Notes (Signed)
She STROKE TEAM PROGRESS NOTE   INTERVAL HISTORY His daughter and son are at the bedside.  Patient is quite sedated and this may be from medication effect from Seroquel.  Family is unable to make a decision on goals of care as they feel patient is too sedated and they like to see how he does without sedation.  He is still unable to swallow and has a panda tube and is in restraints. Vital signs stable Vitals:   03/30/22 0945 03/30/22 1100 03/30/22 1240 03/30/22 1400  BP: (!) 169/96  125/86 (!) 117/91  Pulse: 70  87   Resp: (!) 21 (!) 37 20 (!) 23  Temp: 98.8 F (37.1 C)  97.7 F (36.5 C)   TempSrc: Oral  Oral   SpO2: 96%  95% 94%  Weight:      Height:       CBC:  Recent Labs  Lab 03/24/22 1226 03/24/22 1235 03/25/22 0339 03/26/22 1421 03/29/22 0408 03/30/22 0644  WBC 8.3  --  10.3   < > 9.4 11.9*  NEUTROABS 4.0  --  8.2*  --   --   --   HGB 14.9   < > 13.7   < > 16.8 17.4*  HCT 41.6   < > 37.6*   < > 46.6 52.8*  MCV 89.5  --  89.5   < > 91.9 96.2  PLT 184  --  203   < > 215 231   < > = values in this interval not displayed.   Basic Metabolic Panel:  Recent Labs  Lab 03/29/22 0408 03/30/22 0644  NA 149* 153*  K 3.4* 4.4  CL 110 117*  CO2 28 24  GLUCOSE 231* 343*  BUN 49* 59*  CREATININE 1.99* 2.25*  CALCIUM 9.4 9.3  MG 2.6* 2.9*  PHOS 3.1 2.7   Lipid Panel:  Recent Labs  Lab 03/25/22 0339  CHOL 128  TRIG 179*  HDL 28*  CHOLHDL 4.6  VLDL 36  LDLCALC 64   HgbA1c:  Recent Labs  Lab 03/24/22 1213  HGBA1C 11.1*   Urine Drug Screen:  Recent Labs  Lab 03/25/22 1224  LABOPIA NONE DETECTED  COCAINSCRNUR NONE DETECTED  LABBENZ POSITIVE*  AMPHETMU NONE DETECTED  THCU NONE DETECTED  LABBARB NONE DETECTED    Alcohol Level  Recent Labs  Lab 03/24/22 1522  ETH <10    IMAGING past 24 hours No results found.  PHYSICAL EXAM  Temp:  [97.7 F (36.5 C)-99.3 F (37.4 C)] 97.7 F (36.5 C) (03/28 1240) Pulse Rate:  [60-87] 87 (03/28 1240) Resp:   [20-37] 23 (03/28 1400) BP: (117-175)/(86-105) 117/91 (03/28 1400) SpO2:  [94 %-100 %] 94 % (03/28 1400)  General critically ill in no distress  Mental Status -  Seems pretty lethargic today, exam is unchanged eyes are closed.  Opens eyes to voice and noxious stimuli.  Globally aphasic he will intermittently follow commands but inconsistently.Marland Kitchen  Speech is incomprehensible.  Mild dysarthria. Cranial Nerves II - XII - II - Visual field intact blinks to threat only on the left III, IV, VI -slight left gaze preference, eyes do not cross all the way to the right V - Facial sensation intact bilaterally. VII -unable to assess VIII - Hearing & vestibular intact bilaterally. X - Palate elevates symmetrically. XI - Chin turning & shoulder shrug intact bilaterally. XII - Tongue protrusion intact.  Motor Strength -left side is purposeful.  Right side is hemiplegic Motor Tone - Muscle  tone was assessed at the neck and appendages and was normal. Sensory -decreased sensation on the  right  Coordination -unable to assess  Gait and Station - deferred.  ASSESSMENT/PLAN Shane Jones is a 77 y.o. male with history of uncontrolled diabetes, sleep apnea, hypertension who had not been doing well for the past few days and was taken to his primary care's office for management of high blood sugars, was noted to have a sudden onset of bradycardia, unresponsiveness and vomiting at the doctor's office. EMS was called and they noted that he has a leftward gaze preference as well as right hemiplegia, and activated a code stroke and brought him to the ER.   Stroke: Large acute left PCA infarct Etiology: Large vessel disease versus cardiogenic embolism  code Stroke CT head No acute abnormality. ASPECTS 10.    CTA head & neck left P1 patient in severe stenosis of the right P2 Cerebral angio noted left posterior cerebral artery unsuccessful MRI large left acute PCA infarct with mild petechial blood products in  left occipital lobe 2D Echo technically difficult study.  Left ventricular ejection fraction 50 to 75%.  Left atrium size is normal   LDL 64 HgbA1c 11.1 VTE prophylaxis -heparin subcu    There are no active orders of the following types: Diet, Nourishments.   81 mg prior to admission, now on aspirin 81 mg and Plavix 75 mg.  Therapy recommendations: Pending Disposition: Pending   Hypertension Home meds: Norvasc 10, losartan 100 mg, metoprolol 100 mg Stable Levophed currently off BP goal less than 180 Long-term BP goal normotensive  Hyperlipidemia Home meds: Zetia 10 mg, atorvastatin 80 mg, resumed in hospital LDL 64, goal < 70 Continue statin at discharge  Acute hypoxic respiratory failure due to stroke, extubated Possible aspiration pneumonia Management per CCM Antibiotics per CCM  BiPAP  Agitation/delirium Continue Seroquel Delirium precautions  Dysphagia Consider starting tube feeds if patient not able to be extubated core track for meds and feeds  Diabetes type II UnControlled Home meds: Trulicity, Jardiance 25 mg HgbA1c 11.1, goal < 7.0 CBGs Recent Labs    03/30/22 0322 03/30/22 0746 03/30/22 1236  GLUCAP 293* 365* 384*    [facility] Diabetes management team to help.  Appreciate their assistance Will need diabetes management with PCP after discharge  Other Stroke Risk Factors Advanced Age >/= 57  Cigarette smoker advised to stop smoking Coronary artery disease Obstructive sleep apnea, on CPAP at home Congestive heart failure  Other Active Problems Acute kidney injury-creatinine 1.66 -179-1.99.  Add free water flushes 100 cc every 4 hours.  Check in the a.m. Gout Hypokalemia K 3.4 replaced will check in the a.m.  Hospital day # 6   Patient neurological exam remains unchanged and has not made significant improvement.  Continues to have dysphagia and is on tube feeds.  May need to consider PEG tube and nursing home in a few days if there is no meaningful  improvement.  Discussed with daughter and son at the bedside.  Recommend discontinue a.m. dose of Seroquel and reduce p.m. dose to 12.5 mg.  May need to use restraints.  Will recommend palliative care consult to discuss goals of care. Greater than 50% time during this 35-minute visit spent on counseling and coordination of care about his stroke and discussion with patient and family care team and answering questions Antony Contras, MD Medical Director [place] Pager: [phone] 03/30/2022 3:26 PM  A  Antony Contras MD To contact Stroke  Continuity provider, please refer to [email or web address] After hours, contact General Neurology

## 2022-03-30 NOTE — Progress Notes (Signed)
Nutrition Follow-up  DOCUMENTATION CODES:   Non-severe (moderate) malnutrition in context of chronic illness  INTERVENTION:  Adjust tube feeding via cortrak tube for elevated glucose: Glucerna 1.5 at 50 ml/h (1200 ml per day) Free water 27mL q4h per MD Provides 1800 kcal, 99 gm protein, 977mL of free water (2111 ml flush+TF)  NUTRITION DIAGNOSIS:   Moderate Malnutrition related to chronic illness (uncontrolled T2DM) as evidenced by moderate fat depletion, severe muscle depletion. - remains applicable  GOAL:   Patient will meet greater than or equal to 90% of their needs - progressing, being met with TF  MONITOR:   Diet advancement, Labs, Weight trends, TF tolerance  REASON FOR ASSESSMENT:   Consult, Ventilator Enteral/tube feeding initiation and management  ASSESSMENT:   77 year old male who presented to the ED on 3/22 as a code stroke. PMH of uncontrolled DM, sleep apnea, HTN. Pt admitted with large L PCA infarct.  3/22 - multiple episodes of vomiting, intubated, unsuccessful IR intervention 3/23 - TF protocol initiated 3/24 - extubated 3/25 - Cortrak placement  3/26 - MBS, DYS1/honey  Pt resting in bed at the time of assessment, lethargic and does not interact. Family at bedside reports no questions or concerns at this time. CBGs very elevated, will change to glucerna to reduce the amount of carbohydrates. Also noted that DM coordinator made recommendations for insulin changes. Discussed with RN who will pass along to MD as changes are not in place yet.    Nutritionally Relevant Medications: Scheduled Meds:  atorvastatin  80 mg Per Tube Daily   docusate  100 mg Per Tube BID   ezetimibe  10 mg Per Tube Daily   feeding supplement (PROSource TF20)  60 mL Per Tube BID   free water  100 mL Per Tube Q4H   insulin aspart  0-15 Units Subcutaneous Q4H   multivitamin with minerals  1 tablet Per Tube Daily   pantoprazole IV  40 mg Intravenous Daily   polyethylene glycol   17 g Per Tube Daily   Continuous Infusions:  feeding supplement (OSMOLITE 1.5 CAL) 50 mL/hr at 03/30/22 0700   PRN Meds: senna-docusate  Labs Reviewed: Na 153, chloride 117 BUN 59, creatinine 2.25 Mg 2.9 CBG ranges from 265-384 mg/dL over the last 24 hours HgbA1c 11.1% (3/22)  NUTRITION - FOCUSED PHYSICAL EXAM:  Flowsheet Row Most Recent Value  Orbital Region Moderate depletion  Upper Arm Region Moderate depletion  Thoracic and Lumbar Region Severe depletion  Buccal Region Moderate depletion  Temple Region Moderate depletion  Clavicle Bone Region Severe depletion  Clavicle and Acromion Bone Region Severe depletion  Scapular Bone Region Moderate depletion  Dorsal Hand Unable to assess  Patellar Region Moderate depletion  Anterior Thigh Region Severe depletion  Posterior Calf Region Severe depletion  Edema (RD Assessment) None  Hair Reviewed  Eyes Reviewed  Mouth Reviewed  Skin Reviewed  Nails Reviewed   Diet Order:   Diet Order     None       EDUCATION NEEDS:   Not appropriate for education at this time  Skin:  Skin Assessment: Reviewed RN Assessment  Last BM:  3/28 - type 7  Height:   Ht Readings from Last 1 Encounters:  03/24/22 5\' 5"  (1.651 m)    Weight:   Wt Readings from Last 1 Encounters:  03/29/22 77.8 kg    Ideal Body Weight:  61.8 kg  BMI:  Body mass index is 28.54 kg/m.  Estimated Nutritional Needs:   Kcal:  1800-2000  Protein:  90-110 grams  Fluid:  1.8-2.0 L    Ranell Patrick, RD, LDN Clinical Dietitian RD pager # available in [place]  After hours/weekend pager # available in [facility]

## 2022-03-30 NOTE — Progress Notes (Signed)
Occupational Therapy Treatment Patient Details Name: MARVIN CUCINELLA MRN: [id number] DOB: 05/04/1945 Today's Date: 03/30/2022   History of present illness 77 year old male admitted to ED on 3/22 for R weakness, L gaze, vomiting at MD office. CT CTA shows P1 occlusion, s/p L vertebral artery arteriogram unsuccessful due to significant tortuosity at aortic arch, L subclavian artery, L vertebral artery. MRI showed large acute left PCA distribution infarct. PMH: DM, gout, HTN, sleep apnea   OT comments  Patient seen with PT to address bed mobility and sitting balance. Patient lethargic this treatment session requiring total assist x2 for bed mobility and max assist for sitting balance.  Trunk rotation and lateral leans performed on EOB to address balance. Patient assisted back to supine for ROM to BUEs. Patient to continue to be followed by acute OT to address grooming and bed mobility. Patient appropriate for SNF for discharge due to current level of care and unable to tolerate >3 hours of therapy.    Recommendations for follow up therapy are one component of a multi-disciplinary discharge planning process, led by the attending physician.  Recommendations may be updated based on patient status, additional functional criteria and insurance authorization.    Assistance Recommended at Discharge Frequent or constant Supervision/Assistance  Patient can return home with the following  A lot of help with walking and/or transfers;Two people to help with walking and/or transfers;A lot of help with bathing/dressing/bathroom;Two people to help with bathing/dressing/bathroom;Assist for transportation;Help with stairs or ramp for entrance   Equipment Recommendations  Other (comment) (defer)    Recommendations for Other Services      Precautions / Restrictions Precautions Precautions: Fall Precaution Comments: R inattention vs neglect; L wrist and ankle restraint and L mitten; cortrak Restrictions Weight  Bearing Restrictions: No       Mobility Bed Mobility Overal bed mobility: Needs Assistance Bed Mobility: Rolling, Supine to Sit, Sit to Supine Rolling: Total assist, +2 for physical assistance, +2 for safety/equipment   Supine to sit: Total assist, +2 for physical assistance, +2 for safety/equipment Sit to supine: Total assist, +2 for physical assistance, +2 for safety/equipment   General bed mobility comments: not following commands requiring total assist of 2    Transfers Overall transfer level: Needs assistance                 General transfer comment: not attempted     Balance Overall balance assessment: Needs assistance Sitting-balance support: Feet supported, Single extremity supported Sitting balance-Leahy Scale: Zero Sitting balance - Comments: max assist for sitting balance on EOB Postural control: Right lateral lean, Posterior lean                                 ADL either performed or assessed with clinical judgement   ADL Overall ADL's : Needs assistance/impaired             Lower Body Bathing: Total assistance;Bed level Lower Body Bathing Details (indicate cue type and reason): assisted with cleaning following bowel incontenance                       General ADL Comments: focused on sitting balance and bed mobility    Extremity/Trunk Assessment              Vision       Perception     Praxis      Cognition Arousal/Alertness: Lethargic Behavior During Therapy:  Flat affect Overall Cognitive Status: Difficult to assess Area of Impairment: Attention, Following commands, Awareness                   Current Attention Level: Focused           General Comments: patient not following commands, lethargic and difficult to arouse        Exercises      Shoulder Instructions       General Comments      Pertinent Vitals/ Pain       Pain Assessment Pain Assessment: Faces Faces Pain Scale: No  hurt Pain Intervention(s): Monitored during session  Home Living                                          Prior Functioning/Environment              Frequency  Min 2X/week        Progress Toward Goals  OT Goals(current goals can now be found in the care plan section)  Progress towards OT goals: Progressing toward goals  Acute Rehab OT Goals OT Goal Formulation: Patient unable to participate in goal setting Time For Goal Achievement: 04/10/22 Potential to Achieve Goals: Good ADL Goals Pt Will Perform Grooming: with mod assist;sitting Pt Will Perform Upper Body Dressing: with mod assist;sitting Additional ADL Goal #1: Pt will complete bed mobility with mod A as a precursor to ADLs Additional ADL Goal #2: Pt will locate at least 3 items in R environment with mod A as a precursor to ADL task  Plan Discharge plan remains appropriate;Frequency remains appropriate    Co-evaluation    PT/OT/SLP Co-Evaluation/Treatment: Yes Reason for Co-Treatment: Complexity of the patient's impairments (multi-system involvement);For patient/therapist safety   OT goals addressed during session: Strengthening/ROM      AM-PAC OT "6 Clicks" Daily Activity     Outcome Measure   Help from another person eating meals?: Total Help from another person taking care of personal grooming?: Total Help from another person toileting, which includes using toliet, bedpan, or urinal?: Total Help from another person bathing (including washing, rinsing, drying)?: Total Help from another person to put on and taking off regular upper body clothing?: Total Help from another person to put on and taking off regular lower body clothing?: Total 6 Click Score: 6    End of Session    OT Visit Diagnosis: Unsteadiness on feet (R26.81);Other abnormalities of gait and mobility (R26.89);Muscle weakness (generalized) (M62.81);Cognitive communication deficit (R41.841);Hemiplegia and  hemiparesis Symptoms and signs involving cognitive functions: Cerebral infarction Hemiplegia - Right/Left: Right Hemiplegia - dominant/non-dominant: Dominant Hemiplegia - caused by: Cerebral infarction   Activity Tolerance Patient limited by lethargy   Patient Left in bed;with call bell/phone within reach;with bed alarm set;with restraints reapplied   Nurse Communication Mobility status        Time: [id number] OT Time Calculation (min): 27 min  Charges: OT General Charges $OT Visit: 1 Visit OT Treatments $Therapeutic Activity: 8-22 mins  .rlo  Trixie Dredge 03/30/2022, 2:22 PM

## 2022-03-30 NOTE — Significant Event (Addendum)
Patient remains somnolent; only moving left extremities with sternal rubs and other pain stimulations; does not open eyes to other verbal or tactile stimulation. Review notes from yesterday and mentation appear similar; Dr. Leonie Man at bedside and discuss with family. RN removed restraints as patient is not moving/pulling on tubes/lines, made patient NPO as patient is not safe for oral intake-has thick secretions inside his mouth in which RN suctions out. Held today's dose of seroquel. MEW score noted to yellow and alerted to Dr. Leonie Man. Unable to score NIH with a valid number due to altered mental status. Family is updated at the bedside.

## 2022-03-30 NOTE — Plan of Care (Signed)
  Problem: Health Behavior/Discharge Planning: Goal: Ability to manage health-related needs will improve Outcome: Progressing   

## 2022-03-30 NOTE — Consult Note (Signed)
Palliative Care Consult Note                                  Date: 03/30/2022   Patient Name: Shane Jones  DOB: 30-Apr-1945  MRN: [id number]  Age / Sex: 77 y.o., male  PCP: Leone Haven, MD Referring Physician: Stroke, Md, MD  Reason for Consultation: Establishing goals of care  HPI/Patient Profile: 77 y.o. male  with past medical history of uncontrolled diabetes, sleep apnea, and hypertension.  He had not been feeling well for the past few days and was seen by his PCP on 03/24/2022 for management of high blood sugar.  In the office, he was noted to have a sudden onset of bradycardia, unresponsiveness, and vomiting.  EMS was called and they noted he had a leftward gaze as well as right hemiplegia.  He was brought into the ED as a code stroke.  Found to have a large acute left PCA infarct.  Palliative Medicine was consulted for goals of care.  Past Medical History:  Diagnosis Date   Diabetes mellitus without complication ([place])    Gout    Hypertension    Sleep apnea    has CPAP, doesn't use   Ulcer of esophagus without bleeding    Wears dentures    full upper and lower    Subjective:   I have reviewed medical records including progress notes, labs and imaging, and assessed the patient at bedside.  He is not responsive to voice or light touch on my assessment.  I spoke with his son/Pete and daughter/[name] by phone to discuss diagnosis, prognosis, GOC, EOL wishes, disposition, and options.  I introduced Palliative Medicine as specialized medical care for people living with serious illness. It focuses on providing relief from the symptoms and stress of a serious illness.   A brief life review was discussed.  Patient served in [employer] for 13 years, and was active in the [place] War.  His son and daughter were born in [place] when he was stationed there.  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  Patient's wife passed away approximately 4 years ago.  Prior to admission, patient lived alone in his home and was fully independent.  We discussed patients current illness and what it means in the larger context of his ongoing co-morbidities. Discussed the natural disease trajectory of a sudden, severe neurological injury such as stroke. Discussed that patient will not return to his previous baseline. Discussed that prognostication can be challenging due to the uncertainty predicting neurologic outcome as well as the time required to allow for optimal recovery (weeks to sometimes months).  Created space and opportunity for family to express thoughts regarding patient's current medical situation. Values and goals of care were attempted to be elicited.  Family verbalizes understanding of the seriousness of patient's current medical situation and his very guarded prognosis.  They understand he will not return to his previous baseline.  However, they wish to allow him an opportunity for recovery if possible.  They do speak to the importance of quality of life, especially in the context of patient's previous independence.  Family also shares concern that use of sedation may be affecting patient's true neurologic picture.  I acknowledged their concerns and provided reassurance that morning dose of Seroquel  has been discontinued and bedtime dose had been decreased by half.  Questions and concerns addressed. Plan for follow-up meeting/discussion tomorrow in person.  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  Constitutional:      General: He is not in acute distress.    Appearance: He is ill-appearing.  HENT:      Head:     Comments: Cortrak in place Pulmonary:     Effort: Pulmonary effort is normal.  Neurological:     Mental Status: He is unresponsive.     Vital Signs:  BP 108/79   Pulse 95   Temp (!) 100.9 F (38.3 C)   Resp (!) 38   Ht 5\' 5"  (1.651 m)   Wt 77.8 kg   SpO2 96%   BMI 28.54 kg/m   Palliative Assessment/Data: PPS 30%     Assessment & Plan:   SUMMARY OF RECOMMENDATIONS   DNR/DNI as previously documented Continue full scope interventions Allow time for outcomes Plan for follow-up meeting at bedside tomorrow around 1 PM  Primary Decision Maker: Son and daughter   Prognosis:  Unable to determine  Discharge Planning:  To Be Determined     Thank you for allowing [name] to participate in the care of Irean Hong  MDM - High   Signed by: Elie Confer, NP Palliative Medicine Team  Team Phone # [phone]  For individual providers, please see AMION

## 2022-03-30 NOTE — Progress Notes (Signed)
Physical Therapy Treatment Patient Details Name: Shane Jones MRN: [id number] DOB: 02/04/1945 Today's Date: 03/30/2022   History of Present Illness 77 year old male admitted to ED on 3/22 for R weakness, L gaze, vomiting at MD office. CT CTA shows P1 occlusion, s/p L vertebral artery arteriogram unsuccessful due to significant tortuosity at aortic arch, L subclavian artery, L vertebral artery. MRI showed large acute left PCA distribution infarct. PMH: DM, gout, HTN, sleep apnea    PT Comments    Pt continues to remain lethargic, not opening his eyes or following commands despite max stimulation of music, tactile input, noxious stimuli, dependent mobility, verbal commands, and turning on lights. Pt not trying to pull at cortrak and displaying less spontaneous active movement bil today. Pt completely dependent on therapists for functional mobility and seated balance. Performed PROM to all four extremities in sitting and supine to prevent contractures and maintain full ROM. Will continue to follow acutely. As pt is not at a level to tolerate intensive therapy of >3 hours/day, he will likely be more appropriate for less intensive inpatient rehab of <3 hours/day at this time.    Recommendations for follow up therapy are one component of a multi-disciplinary discharge planning process, led by the attending physician.  Recommendations may be updated based on patient status, additional functional criteria and insurance authorization.  Follow Up Recommendations  Can patient physically be transported by private vehicle: No    Assistance Recommended at Discharge Frequent or constant Supervision/Assistance  Patient can return home with the following Two people to help with walking and/or transfers;Two people to help with bathing/dressing/bathroom;Assistance with cooking/housework;Direct supervision/assist for medications management;Direct supervision/assist for financial management;Assist for  transportation;Help with stairs or ramp for entrance;Assistance with feeding   Equipment Recommendations  BSC/3in1;Other (comment)    Recommendations for Other Services       Precautions / Restrictions Precautions Precautions: Fall Precaution Comments: R inattention vs neglect; L mitten; cortrak Restrictions Weight Bearing Restrictions: No     Mobility  Bed Mobility Overal bed mobility: Needs Assistance Bed Mobility: Rolling, Supine to Sit, Sit to Supine Rolling: Total assist, +2 for physical assistance, +2 for safety/equipment   Supine to sit: Total assist, +2 for physical assistance, +2 for safety/equipment Sit to supine: Total assist, +2 for physical assistance, +2 for safety/equipment   General bed mobility comments: not following commands requiring total assist of 2    Transfers                   General transfer comment: not attempted    Ambulation/Gait               General Gait Details: unable   Stairs             Wheelchair Mobility    Modified Rankin (Stroke Patients Only) Modified Rankin (Stroke Patients Only) Pre-Morbid Rankin Score: No symptoms Modified Rankin: Severe disability     Balance Overall balance assessment: Needs assistance Sitting-balance support: Feet supported, Single extremity supported, No upper extremity supported Sitting balance-Leahy Scale: Zero Sitting balance - Comments: max assist-total assist for sitting balance on EOB, displaying no reactional strategies; propped pt on each elbow 2x Postural control: Right lateral lean, Posterior lean     Standing balance comment: deferred                            Cognition Arousal/Alertness: Lethargic Behavior During Therapy: Flat affect Overall Cognitive Status: Difficult to assess Area  of Impairment: Attention, Following commands, Awareness                   Current Attention Level: Focused           General Comments: patient not  following commands, lethargic and difficult to arouse, keeping eyes closed all of the session; less sponatenous movement noted bil today; not pulling at lines today either        Exercises Other Exercises Other Exercises: PROM to bil UEs into shoulder flexion, elbow flexion, and elbow extension >10x bil sitting EOB Other Exercises: PROM to trunk into rotation bil while sitting EOB Other Exercises: propped pt onto either elbow several times while sitting EOB, pt not trying to push up to sit up today though Other Exercises: PROM to bil legs into hip flexion, hip abduction/adduction, knee flexion, and knee extension >10x bil sitting EOB Other Exercises: PROM to bil knees into flexion with hip flexion, hip abduction/adduction, and ankle dorsiflexion >10x bil supine in bed    General Comments        Pertinent Vitals/Pain Pain Assessment Pain Assessment: Faces Faces Pain Scale: No hurt Breathing: normal Negative Vocalization: none Facial Expression: smiling or inexpressive Body Language: relaxed Consolability: no need to console PAINAD Score: 0 Pain Intervention(s): Monitored during session    Home Living                          Prior Function            PT Goals (current goals can now be found in the care plan section) Acute Rehab PT Goals Patient Stated Goal: did not state PT Goal Formulation: Patient unable to participate in goal setting Time For Goal Achievement: 04/10/22 Potential to Achieve Goals: Fair Progress towards PT goals: Not progressing toward goals - comment (lethargy limiting pt)    Frequency    Min 3X/week      PT Plan Frequency needs to be updated;Discharge plan needs to be updated    Co-evaluation PT/OT/SLP Co-Evaluation/Treatment: Yes Reason for Co-Treatment: Complexity of the patient's impairments (multi-system involvement);For patient/therapist safety;To address functional/ADL transfers PT goals addressed during session:  Mobility/safety with mobility;Balance;Strengthening/ROM OT goals addressed during session: Strengthening/ROM      AM-PAC PT "6 Clicks" Mobility   Outcome Measure  Help needed turning from your back to your side while in a flat bed without using bedrails?: Total Help needed moving from lying on your back to sitting on the side of a flat bed without using bedrails?: Total Help needed moving to and from a bed to a chair (including a wheelchair)?: Total Help needed standing up from a chair using your arms (e.g., wheelchair or bedside chair)?: Total Help needed to walk in hospital room?: Total Help needed climbing 3-5 steps with a railing? : Total 6 Click Score: 6    End of Session   Activity Tolerance: Patient limited by lethargy Patient left: in bed;with call bell/phone within reach;with bed alarm set;with restraints reapplied   PT Visit Diagnosis: Hemiplegia and hemiparesis;Other abnormalities of gait and mobility (R26.89);Unsteadiness on feet (R26.81);Muscle weakness (generalized) (M62.81);Difficulty in walking, not elsewhere classified (R26.2);Other symptoms and signs involving the nervous system (R29.898) Hemiplegia - Right/Left: Right Hemiplegia - dominant/non-dominant: Dominant Hemiplegia - caused by: Cerebral infarction     Time: [id number] PT Time Calculation (min) (ACUTE ONLY): 28 min  Charges:  $Therapeutic Exercise: 8-22 mins  Moishe Spice, PT, DPT Acute Rehabilitation Services  Office: [place] 03/30/2022, 5:16 PM

## 2022-03-30 NOTE — TOC Progression Note (Signed)
Transition of Care [facility]) - Progression Note    Patient Details  Name: Shane Jones MRN: [id number] Date of Birth: [birth year]  Transition of Care [facility]) CM/SW [place], [place] Phone Number: 03/30/2022, 1:39 PM  Clinical Narrative:   CSW contacted by patient's son, [name], asking to discuss the patient and next steps. CSW met with son and daughter, [name], to discuss concerns and answer questions. Son and daughter concerned that the patient is being too overly sedated so that he cannot recover, they are hopeful that the patient can come off of sedating medications so that he could be given a better chance to participate in therapy and see what he can do. Per daughter, they are not interested in just giving up on him without giving him a chance. CSW discussed pending palliative consult, and that they should express those concerns to the palliative provider to see if there are other medications/interventions that could assist with the patient's agitation without being so sedating. Family indicated understanding. CSW discussed that the patient isn't at a point yet to discuss any rehab venue options as he is too lethargic to participate in therapy, and family indicated understanding. CSW to follow for assistance with disposition when goals of care are determined.     Expected Discharge Plan: [place] Barriers to Discharge: Continued Medical Work up, Ship broker  Expected Discharge Plan and Services   Discharge Planning Services: CM Consult Post Acute Care Choice: IP Rehab Living arrangements for the past 2 months: Single Family Home                                       Social Determinants of Health (SDOH) Interventions SDOH Screenings   Food Insecurity: No Food Insecurity (05/23/2021)  Housing: Medium Risk (05/23/2021)  Transportation Needs: No Transportation Needs (05/23/2021)  Depression (PHQ2-9): Low Risk  (03/24/2022)  Financial Resource  Strain: High Risk (12/22/2021)  Physical Activity: Sufficiently Active (02/10/2020)  Social Connections: Socially Isolated (02/10/2020)  Stress: No Stress Concern Present (05/23/2021)  Tobacco Use: Medium Risk (03/28/2022)    Readmission Risk Interventions    03/27/2022    2:58 PM  Readmission Risk Prevention Plan  Transportation Screening Complete  PCP or Specialist Appt within 5-7 Days Complete  Home Care Screening Complete  Medication Review (RN CM) Referral to Pharmacy

## 2022-03-31 ENCOUNTER — Inpatient Hospital Stay (HOSPITAL_COMMUNITY): Payer: PPO

## 2022-03-31 DIAGNOSIS — R1310 Dysphagia, unspecified: Secondary | ICD-10-CM

## 2022-03-31 DIAGNOSIS — Z515 Encounter for palliative care: Secondary | ICD-10-CM | POA: Diagnosis not present

## 2022-03-31 DIAGNOSIS — N179 Acute kidney failure, unspecified: Secondary | ICD-10-CM | POA: Diagnosis not present

## 2022-03-31 DIAGNOSIS — I639 Cerebral infarction, unspecified: Secondary | ICD-10-CM | POA: Diagnosis not present

## 2022-03-31 DIAGNOSIS — G9341 Metabolic encephalopathy: Secondary | ICD-10-CM | POA: Diagnosis not present

## 2022-03-31 LAB — GLUCOSE, CAPILLARY
Glucose-Capillary: 238 mg/dL — ABNORMAL HIGH (ref 70–99)
Glucose-Capillary: 314 mg/dL — ABNORMAL HIGH (ref 70–99)
Glucose-Capillary: 231 mg/dL — ABNORMAL HIGH (ref 70–99)
Glucose-Capillary: 210 mg/dL — ABNORMAL HIGH (ref 70–99)
Glucose-Capillary: 262 mg/dL — ABNORMAL HIGH (ref 70–99)

## 2022-03-31 LAB — CBC WITH DIFFERENTIAL/PLATELET
Abs Immature Granulocytes: 0.11 10*3/uL — ABNORMAL HIGH (ref 0.00–0.07)
Basophils Absolute: 0.1 10*3/uL (ref 0.0–0.1)
Basophils Relative: 1 %
Eosinophils Absolute: 0 10*3/uL (ref 0.0–0.5)
Eosinophils Relative: 0 %
HCT: 52.4 % — ABNORMAL HIGH (ref 39.0–52.0)
Hemoglobin: 17.1 g/dL — ABNORMAL HIGH (ref 13.0–17.0)
Immature Granulocytes: 1 %
Lymphocytes Relative: 11 %
Lymphs Abs: 2 10*3/uL (ref 0.7–4.0)
MCH: 32.1 pg (ref 26.0–34.0)
MCHC: 32.6 g/dL (ref 30.0–36.0)
MCV: 98.3 fL (ref 80.0–100.0)
Monocytes Absolute: 1.5 10*3/uL — ABNORMAL HIGH (ref 0.1–1.0)
Monocytes Relative: 9 %
Neutro Abs: 13.9 10*3/uL — ABNORMAL HIGH (ref 1.7–7.7)
Neutrophils Relative %: 78 %
Platelets: 211 10*3/uL (ref 150–400)
RBC: 5.33 MIL/uL (ref 4.22–5.81)
RDW: 13.8 % (ref 11.5–15.5)
WBC: 17.6 10*3/uL — ABNORMAL HIGH (ref 4.0–10.5)
nRBC: 0 % (ref 0.0–0.2)

## 2022-03-31 LAB — BASIC METABOLIC PANEL
Anion gap: 14 (ref 5–15)
BUN: 91 mg/dL — ABNORMAL HIGH (ref 8–23)
CO2: 21 mmol/L — ABNORMAL LOW (ref 22–32)
Calcium: 8.8 mg/dL — ABNORMAL LOW (ref 8.9–10.3)
Chloride: 117 mmol/L — ABNORMAL HIGH (ref 98–111)
Creatinine, Ser: 3.96 mg/dL — ABNORMAL HIGH (ref 0.61–1.24)
GFR, Estimated: 15 mL/min — ABNORMAL LOW (ref >60)
Glucose, Bld: 290 mg/dL — ABNORMAL HIGH (ref 70–99)
Potassium: 4.7 mmol/L (ref 3.5–5.1)
Sodium: 152 mmol/L — ABNORMAL HIGH (ref 135–145)

## 2022-03-31 LAB — MAGNESIUM: Magnesium: 3 mg/dL — ABNORMAL HIGH (ref 1.7–2.4)

## 2022-03-31 LAB — PHOSPHORUS: Phosphorus: 4 mg/dL (ref 2.5–4.6)

## 2022-03-31 MED ORDER — DEXTROSE 5 % IV SOLN: INTRAVENOUS | Status: DC

## 2022-03-31 MED ORDER — SODIUM CHLORIDE 0.9 % IV SOLN
3.0000 g | Freq: Two times a day (BID) | INTRAVENOUS | Status: DC
  Administered 2022-03-31 – 2022-04-01 (×2): 3 g via INTRAVENOUS
  Filled 2022-03-31 (×2): qty 8

## 2022-03-31 MED ORDER — INSULIN ASPART 100 UNIT/ML IJ SOLN
4.0000 [IU] | Freq: Four times a day (QID) | INTRAMUSCULAR | Status: DC
  Administered 2022-03-31: 4 [IU] via SUBCUTANEOUS

## 2022-03-31 MED ORDER — INSULIN ASPART 100 UNIT/ML IJ SOLN
4.0000 [IU] | INTRAMUSCULAR | Status: DC
  Administered 2022-03-31 – 2022-04-01 (×5): 4 [IU] via SUBCUTANEOUS

## 2022-03-31 MED ORDER — SODIUM CHLORIDE 0.9 % IV SOLN: INTRAVENOUS | Status: DC

## 2022-03-31 MED ORDER — SODIUM CHLORIDE 0.9 % IV BOLUS
1000.0000 mL | Freq: Once | INTRAVENOUS | Status: AC
  Administered 2022-03-31: 1000 mL via INTRAVENOUS

## 2022-03-31 NOTE — Assessment & Plan Note (Signed)
-  Progressive neurologic impairment, with etiology being devastating CVA.  Patient not expected to make recovery.  Now comfort care.

## 2022-03-31 NOTE — Progress Notes (Signed)
Pharmacy Antibiotic Note  Shane Jones is a 77 y.o. male admitted on 03/24/2022 with  asp pna .  Pharmacy has been consulted for Unasyn dosing. Noted pt on Unasyn 3/22-3/24 this admission for asp pna.  Plan: Unasyn 3gm IV q12h Will f/u renal function, micro data, and pt's clinical condition  Height: 5\' 5"  (165.1 cm) Weight: 68 kg (149 lb 14.6 oz) IBW/kg (Calculated) : 61.5  Temp (24hrs), Avg:99.5 F (37.5 C), Min:98 F (36.7 C), Max:100.9 F (38.3 C)  Recent Labs  Lab 03/24/22 1731 03/24/22 1950 03/25/22 0339 03/27/22 0556 03/28/22 0728 03/28/22 1203 03/29/22 0408 03/30/22 0644 03/31/22 0727  WBC  --   --    < > 6.4  --  7.8 9.4 11.9* 17.6*  CREATININE 1.55*  --    < > 1.69* 1.78*  --  1.99* 2.25* 3.96*  LATICACIDVEN 1.6 1.6  --   --   --   --   --   --   --    < > = values in this interval not displayed.    Estimated Creatinine Clearance: 13.2 mL/min (A) (by C-G formula based on SCr of 3.96 mg/dL (H)).    Allergies  Allergen Reactions   Uloric [Febuxostat] Hives    Antimicrobials this admission: Unasyn 3/22 >> 3/24; restart 3/29>>  Microbiology results: 3/29 BCx:  3/22 MRSA PCR: neg  Thank you for allowing pharmacy to be a part of this patient's care.  Sherlon Handing, PharmD, BCPS Please see amion for complete clinical pharmacist phone list 03/31/2022 3:08 PM

## 2022-03-31 NOTE — Consult Note (Signed)
Initial Consultation Note   [name] [id number] DOB: 1945-01-10 PCP: Shane Haven, MD DOA: 03/24/2022 DOS: the patient was seen and examined on 03/31/2022 Primary service: Stroke, Md, MD  Referring physician: Leonie Jones Reason for consult: Requesting medical consult for stroke patient with worsening kidney function and encephalopathy.    Assessment and Plan: * Acute ischemic stroke ([place]) -Large recent CVA -R hemiparesis -Concern for progression to terminal condition  -Neurology is managing -Palliative care is also involved  Acute metabolic encephalopathy -Progressive neurologic impairment -He had agitation, was given Seroquel earlier this week -Initial concern that hypersomnolence was related to Seroquel but has not improved with cessation of medication -Obtunded this AM with tachypnea -Possible aspiration, will treat and attempt to rehydrate with free water -If not improving in the next 24-48 hours, may need to transition to comfort measures  Malnutrition of moderate degree (HCC) -Currently getting tube feeds via Cortrak -Has post-pyloric tube so unlikely to contribute to aspiration so will continue   AKI (acute kidney injury) ([place]) With hypernatremia -Free water deficit is 2.9L -Will change IVF to D5W (although this is likely to worsen hyperglycemia) -Recheck labs in AM -He may be approaching multisystem organ failure and this may be ineffective -Family is aware of the severity of his condition and may transition to comfort measures in the next 24-48 hours if not improving  Dysphagia -Prior concern for aspiration, received antibiotics -He was on a dysphagia diet yesterday but had significant mouth residue yesterday and this was held -High risk of aspiration -His AMS may be related to aspiration pneumonia and this would be a reversible condition so will initiate Unasyn for now and continue to monitor  Sleep apnea -Not on CPAP at  home  Hyperlipidemia -Continue atorvastatin per tube  Diabetes mellitus without complication (HCC) -Continue glargine and moderate-scale SSI  Hypertension -Receiving amlodipine, metoprolol via tube feeds     TRH will continue to follow the patient.  HPI: Shane Jones is a 77 y.o. male with past medical history of HTN, HLD, DM, gout, and OSA not on CPAP who presented on 3/22 with large acute PCA infarct.  He was extubated, is on antibiotics due to possible aspiration PNA.  He was started on Seroquel on Monday evening and has been somnolent since - ?related to medication.  Palliative care is involved and they are having [place] discussions (he is DNR).  On exam, the patient is obtunded with basically no meaningful response even to pain.  Marked tachypnea with increased WOB.  Cortrak in place with Glucerna feeds.   Review of Systems: unable to review all systems due to the inability of the patient to answer questions. Past Medical History:  Diagnosis Date   Diabetes mellitus without complication ([place])    Gout    Hypertension    Sleep apnea    has CPAP, doesn't use   Ulcer of esophagus without bleeding    Wears dentures    full upper and lower   Past Surgical History:  Procedure Laterality Date   BALLOON DILATION N/A 10/22/2017   Procedure: BALLOON DILATION;  Surgeon: Lucilla Lame, MD;  Location: [place];  Service: Endoscopy;  Laterality: N/A;   CATARACT EXTRACTION W/PHACO Right 02/02/2022   Procedure: CATARACT EXTRACTION PHACO AND INTRAOCULAR LENS PLACEMENT (I[facility]) RIGHT DIABETIC  6.48  00:45.6;  Surgeon: Norvel Richards, MD;  Location: [place];  Service: Ophthalmology;  Laterality: Right;  Diabetic   CATARACT EXTRACTION W/PHACO Left 02/16/2022   Procedure:  CATARACT EXTRACTION PHACO AND INTRAOCULAR LENS PLACEMENT ([place]) LEFT DIABTIC  7.58  00:46.2;  Surgeon: Norvel Richards, MD;  Location: [place];  Service: Ophthalmology;  Laterality: Left;   Diabetic   ESOPHAGOGASTRODUODENOSCOPY (EGD) WITH PROPOFOL N/A 10/22/2017   Procedure: ESOPHAGOGASTRODUODENOSCOPY (EGD) WITH Biopsy;  Surgeon: Lucilla Lame, MD;  Location: [place];  Service: Endoscopy;  Laterality: N/A;   IR PERCUTANEOUS ART THROMBECTOMY/INFUSION INTRACRANIAL INC DIAG ANGIO  03/24/2022   RADIOLOGY WITH ANESTHESIA N/A 03/24/2022   Procedure: IR WITH ANESTHESIA;  Surgeon: Radiologist, Medication, MD;  Location: [place];  Service: Radiology;  Laterality: N/A;   TONSILLECTOMY     Social History:  reports that he quit smoking about 43 years ago. His smoking use included cigarettes. He has been exposed to tobacco smoke. He has never used smokeless tobacco. He reports that he does not currently use alcohol after a past usage of about 1.0 standard drink of alcohol per week. He reports that he does not use drugs.  Allergies  Allergen Reactions   Uloric [Febuxostat] Hives    No family history on file.  Prior to Admission medications   Medication Sig Start Date End Date Taking? Authorizing Provider  allopurinol (ZYLOPRIM) 100 MG tablet Take 1 tablet (100 mg total) by mouth daily. 01/27/22  Yes Shane Haven, MD  amLODipine (NORVASC) 10 MG tablet Take 1 tablet (10 mg total) by mouth daily. 01/27/22  Yes Shane Haven, MD  aspirin EC 81 MG tablet Take 81 mg by mouth daily. Swallow whole.   Yes [provider]  atorvastatin (LIPITOR) 80 MG tablet Take 1 tablet (80 mg total) by mouth daily. 01/27/22  Yes Shane Haven, MD  Dulaglutide (TRULICITY) 4.5 0000000 SOPN Inject 4.5 mg into the skin once a week. 11/07/21  Yes Shane Haven, MD  empagliflozin (JARDIANCE) 25 MG TABS tablet Take 1 tablet (25 mg total) by mouth daily. 11/07/21  Yes Shane Haven, MD  escitalopram (LEXAPRO) 20 MG tablet TAKE 1 TABLET (20 MG) BY MOUTH DAILY Patient taking differently: Take 20 mg by mouth daily. 11/23/21  Yes Shane Haven, MD  ezetimibe (ZETIA) 10 MG tablet  Take 1 tablet (10 mg total) by mouth daily. 11/23/21  Yes Shane Haven, MD  fluticasone (FLONASE) 50 MCG/ACT nasal spray Place 2 sprays into both nostrils daily. Patient taking differently: Place 2 sprays into both nostrils daily as needed for allergies or rhinitis. 04/11/21  Yes Shane Haven, MD  furosemide (LASIX) 20 MG tablet Take 1 tablet (20 mg total) by mouth daily. 01/27/22  Yes Shane Haven, MD  gabapentin (NEURONTIN) 400 MG capsule Take 1 capsule (400 mg total) by mouth 3 (three) times daily as needed. 01/27/22  Yes Shane Haven, MD  Insulin Glargine [facility] KWIKPEN) 100 UNIT/ML Inject 30 Units into the skin daily.   Yes [provider]  losartan (COZAAR) 100 MG tablet Take 1 tablet (100 mg total) by mouth daily. 01/27/22  Yes Shane Haven, MD  metoprolol succinate (TOPROL-XL) 100 MG 24 hr tablet Take 1 tablet (100 mg total) by mouth daily. 01/27/22  Yes Shane Haven, MD  pantoprazole (PROTONIX) 40 MG tablet Take 1 tablet (40 mg total) by mouth daily. 01/27/22  Yes Shane Haven, MD  blood glucose meter kit and supplies Dispense based on patient and insurance preference. Use up to four times daily as directed. (FOR ICD-10 E10.9, E11.9). 03/11/18   Shane Haven, MD  Continuous  Blood Gluc Sensor (FREESTYLE LIBRE 2 SENSOR) MISC Use to check glucose continuously 12/20/21   Kaylyn Layer T, RPH-CPP  Insulin Pen Needle (PEN NEEDLES) 32G X 4 MM MISC Use to inject insulin up to 4 times daily 12/29/19   Shane Haven, MD    Physical Exam: Vitals:   03/31/22 0745 03/31/22 0808 03/31/22 0838 03/31/22 1105  BP:  126/89 126/81 124/82  Pulse:      Resp: (!) 41 (!) 25 (!) 35 (!) 38  Temp:    98.4 F (36.9 C)  TempSrc:    Oral  SpO2: 94% 93% 96% 96%  Weight:      Height:       General:  Appears labored with breathing, obtunded Eyes:  normal lids, eyes closed throughout ENT:  grossly normal lips & tongue, tube feeds running Neck:  no  LAD, masses or thyromegaly Cardiovascular:  RRR, no m/r/g. No LE edema.  Respiratory:   CTA bilaterally with rare scattered rhonchi.  Normal respiratory effort. Abdomen:  soft, NT, ND Skin:  no rash or induration seen on limited exam Musculoskeletal:  R-sided hemiparesis, LUE and LLE appear to have tone Psychiatric: obtunded Neurologic:  unable to perform effectively   Radiological Exams on Admission: Independently reviewed - see discussion in A/P where applicable  DG CHEST PORT 1 VIEW  Result Date: 03/31/2022 CLINICAL DATA:  Fever, leukocytosis. EXAM: PORTABLE CHEST 1 VIEW COMPARISON:  03/30/2022. FINDINGS: 1302 hours. Feeding tube courses below the diaphragm, beyond the field of view. Rotated patient. No focal airspace opacity. Stable cardiac and mediastinal contours accounting for patient rotation. No pleural effusion or pneumothorax. IMPRESSION: No evidence of acute cardiopulmonary disease. Feeding tube courses below the diaphragm, beyond the field of view. Electronically Signed   By: Emmit Alexanders M.D.   On: 03/31/2022 13:24   CT HEAD WO CONTRAST (5MM)  Result Date: 03/30/2022 CLINICAL DATA:  Stroke follow-up EXAM: CT HEAD WITHOUT CONTRAST TECHNIQUE: Contiguous axial images were obtained from the base of the skull through the vertex without intravenous contrast. RADIATION DOSE REDUCTION: This exam was performed according to the departmental dose-optimization program which includes automated exposure control, adjustment of the mA and/or kV according to patient size and/or use of iterative reconstruction technique. COMPARISON:  None Available. FINDINGS: Brain: Early subacute infarct of the entirety of the left PCA territory with associated cytotoxic edema. No acute hemorrhage. No midline shift or other mass effect. No hydrocephalus. Vascular: No abnormal hyperdensity of the major intracranial arteries or dural venous sinuses. No intracranial atherosclerosis. Skull: The visualized skull base,  calvarium and extracranial soft tissues are normal. Sinuses/Orbits: No fluid levels or advanced mucosal thickening of the visualized paranasal sinuses. No mastoid or middle ear effusion. The orbits are normal. IMPRESSION: Early subacute infarct of the entirety of the left PCA territory with associated cytotoxic edema. No acute hemorrhage. Electronically Signed   By: Ulyses Jarred M.D.   On: 03/30/2022 19:01   DG Chest 2 View  Result Date: 03/30/2022 CLINICAL DATA:  Pneumonia EXAM: CHEST TWO VIEWS COMPARISON:  03/25/2022 FINDINGS: Feeding tube traverses esophagus and stomach. Normal heart size, mediastinal contours, and pulmonary vascularity. Atherosclerotic calcification aorta. Minimal LEFT basilar atelectasis. Lungs otherwise clear. No pulmonary infiltrate, pleural effusion, or pneumothorax. IMPRESSION: Minimal LEFT basilar atelectasis without infiltrate. Electronically Signed   By: Lavonia Dana M.D.   On: 03/30/2022 18:57    EKG: none since 3/25   Labs on Admission: I have personally reviewed the available labs and imaging studies at  the time of the admission.  Pertinent labs:    Na++ 152 Glucose 290 BUN 91/Creatinine 3.96/GFR 15; 59/2.25/29 on 3/28; 28/1.58/42 on 1/26 WBC 17.6  Family Communication: None present; family had palliative care meeting around the time of my consult Primary team communication: Discussed with neurology team at the time of consult  Thank you very much for involving [name] in the care of your patient.  Author: Karmen Bongo, MD 03/31/2022 2:21 PM  For on call review [email or web address].

## 2022-03-31 NOTE — Progress Notes (Signed)
   03/31/22 0036  Provider Notification  Provider Name/Title Dr Lorrin Goodell  Date Provider Notified 03/31/22  Time Provider Notified 0037  Method of Notification Page  Notification Reason Other (Comment) (Pt's BP read 70/51)  Provider response See new orders  Date of Provider Response 03/31/22  Time of Provider Response [phone]

## 2022-03-31 NOTE — Progress Notes (Signed)
Palliative Medicine Progress Note   Patient Name: Shane Jones       Date: 03/31/2022 DOB: 04-11-1945  Age: 77 y.o. MRN#: [id number] Attending Physician: Stroke, Md, MD Primary Care Physician: Leone Haven, MD Admit Date: 03/24/2022    HPI/Patient Profile: 77 y.o. male  with past medical history of uncontrolled diabetes, sleep apnea, and hypertension.  He had not been feeling well for the past few days and was seen by his PCP on 03/24/2022 for management of high blood sugar.  In the office, he was noted to have a sudden onset of bradycardia, unresponsiveness, and vomiting.  EMS was called and they noted he had a leftward gaze as well as right hemiplegia.  He was brought into the ED as a code stroke.  Found to have a large acute left PCA infarct.   Palliative Medicine was consulted for goals of care.  Subjective: Chart reviewed. Patient noted to be unresponsive overnight. Head CT yesterday was negative for acute abnormality.   I assessed patient at bedside. He is unresponsive to sternal rub.  I met with daughter/Shane Jones and son/Shane Jones in the 3W waiting room. We reviewed patient's current clinical and neurologic status. They agree his condition has declined over the past 24 hours.   The difference between full scope medical intervention and comfort care was considered.  I introduced the concept of a comfort path, emphasizing this involves de-escalating full scope medical interventions and allowing a natural course to occur. Discussed that the goal is comfort and dignity rather than cure/prolonging life.   Family verbalizes understanding that prognosis is poor, but they are not emotionally ready to transition to comfort care at this time. Discussed continuing current supportive care for the next  24-48 hours to allow time for family to process the current situation. Family agreeable to no escalation of care in the meantime. If no improvement or with further decline, family open to further discussion regarding transition to comfort.    Objective:  Physical Exam Vitals reviewed.  Constitutional:      General: He is not in acute distress.    Appearance: He is ill-appearing.  HENT:     Head:     Comments: Cortrak in place Pulmonary:     Effort: Tachypnea present.  Neurological:     Mental Status: He is unresponsive.  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 Lorin Mercy  Thank you for allowing the Palliative Medicine Team to assist in the care of this patient.   Greater than 50%  of this time was spent counseling and coordinating care related to the above assessment and plan.  Total time: 70 minutes   Lavena Bullion, NP Palliative Medicine   Please contact Palliative Medicine Team phone at [phone] for questions and concerns.  For individual provider, see AMION.

## 2022-03-31 NOTE — Assessment & Plan Note (Signed)
-  Had been on atorvastatin per tube until discontinued for comfort care

## 2022-03-31 NOTE — Assessment & Plan Note (Signed)
Large devastating left PCA infarct.  Patient unresponsive.  Now comfort care.

## 2022-03-31 NOTE — Assessment & Plan Note (Signed)
Not on CPAP at home. 

## 2022-03-31 NOTE — Assessment & Plan Note (Signed)
-  Prior concern for aspiration, received antibiotics -He was on a dysphagia diet yesterday but had significant mouth residue yesterday and this was held -High risk of aspiration -His AMS may be related to aspiration pneumonia and this would be a reversible condition so will initiate Unasyn for now and continue to monitor

## 2022-03-31 NOTE — Assessment & Plan Note (Signed)
-  Continue glargine and moderate-scale SSI.  Now discontinued for being comfort care

## 2022-03-31 NOTE — Assessment & Plan Note (Signed)
With hypernatremia -Free water deficit is 2.9L.  Initially had been placed on fluids, but now with worsening labs patient now comfort care.  No further lab work, no need for fluids.

## 2022-03-31 NOTE — Progress Notes (Addendum)
She STROKE TEAM PROGRESS NOTE   INTERVAL HISTORY  No family at bedside.  Patient remains obtunded withdrawing to pain and purposeful on left side no response on right.  Sedation (Seroquel) was discontinued yesterday, with no improvement in mental status today.  Overnight fever, leukocytosis, chest x-ray, urinalysis, blood cultures ordered.  Creatinine increased; fluids ordered as well as hospitalist consult for worsening kidney function and encephalopathy.  Seroquel DC'd yesterday but patient's mental status has not improved Patient remains unable to swallow, with core track tube, restraints. CT head yesterday shows no acute abnormality subacute left PCA infarct with mild associated cytotoxic edema..  Chest x-ray showed lower lobe atelectasis but no clear infiltrate  Vitals:   03/31/22 0745 03/31/22 0808 03/31/22 0838 03/31/22 1105  BP:  126/89 126/81 124/82  Pulse:      Resp: (!) 41 (!) 25 (!) 35 (!) 38  Temp:    98.4 F (36.9 C)  TempSrc:    Oral  SpO2: 94% 93% 96% 96%  Weight:      Height:       CBC:  Recent Labs  Lab 03/25/22 0339 03/26/22 1421 03/30/22 0644 03/31/22 0727  WBC 10.3   < > 11.9* 17.6*  NEUTROABS 8.2*  --   --  13.9*  HGB 13.7   < > 17.4* 17.1*  HCT 37.6*   < > 52.8* 52.4*  MCV 89.5   < > 96.2 98.3  PLT 203   < > 231 211   < > = values in this interval not displayed.    Basic Metabolic Panel:  Recent Labs  Lab 03/30/22 0644 03/31/22 0727  NA 153* 152*  K 4.4 4.7  CL 117* 117*  CO2 24 21*  GLUCOSE 343* 290*  BUN 59* 91*  CREATININE 2.25* 3.96*  CALCIUM 9.3 8.8*  MG 2.9* 3.0*  PHOS 2.7 4.0    Lipid Panel:  Recent Labs  Lab 03/25/22 0339  CHOL 128  TRIG 179*  HDL 28*  CHOLHDL 4.6  VLDL 36  LDLCALC 64    HgbA1c:  No results for input(s): "HGBA1C" in the last 168 hours.  Urine Drug Screen:  Recent Labs  Lab 03/25/22 1224  LABOPIA NONE DETECTED  COCAINSCRNUR NONE DETECTED  LABBENZ POSITIVE*  AMPHETMU NONE DETECTED  THCU NONE  DETECTED  LABBARB NONE DETECTED     Alcohol Level  Recent Labs  Lab 03/24/22 1522  ETH <10     IMAGING past 24 hours DG CHEST PORT 1 VIEW  Result Date: 03/31/2022 CLINICAL DATA:  Fever, leukocytosis. EXAM: PORTABLE CHEST 1 VIEW COMPARISON:  03/30/2022. FINDINGS: 1302 hours. Feeding tube courses below the diaphragm, beyond the field of view. Rotated patient. No focal airspace opacity. Stable cardiac and mediastinal contours accounting for patient rotation. No pleural effusion or pneumothorax. IMPRESSION: No evidence of acute cardiopulmonary disease. Feeding tube courses below the diaphragm, beyond the field of view. Electronically Signed   By: Emmit Alexanders M.D.   On: 03/31/2022 13:24   CT HEAD WO CONTRAST (5MM)  Result Date: 03/30/2022 CLINICAL DATA:  Stroke follow-up EXAM: CT HEAD WITHOUT CONTRAST TECHNIQUE: Contiguous axial images were obtained from the base of the skull through the vertex without intravenous contrast. RADIATION DOSE REDUCTION: This exam was performed according to the departmental dose-optimization program which includes automated exposure control, adjustment of the mA and/or kV according to patient size and/or use of iterative reconstruction technique. COMPARISON:  None Available. FINDINGS: Brain: Early subacute infarct of the entirety of the  left PCA territory with associated cytotoxic edema. No acute hemorrhage. No midline shift or other mass effect. No hydrocephalus. Vascular: No abnormal hyperdensity of the major intracranial arteries or dural venous sinuses. No intracranial atherosclerosis. Skull: The visualized skull base, calvarium and extracranial soft tissues are normal. Sinuses/Orbits: No fluid levels or advanced mucosal thickening of the visualized paranasal sinuses. No mastoid or middle ear effusion. The orbits are normal. IMPRESSION: Early subacute infarct of the entirety of the left PCA territory with associated cytotoxic edema. No acute hemorrhage.  Electronically Signed   By: Ulyses Jarred M.D.   On: 03/30/2022 19:01   DG Chest 2 View  Result Date: 03/30/2022 CLINICAL DATA:  Pneumonia EXAM: CHEST TWO VIEWS COMPARISON:  03/25/2022 FINDINGS: Feeding tube traverses esophagus and stomach. Normal heart size, mediastinal contours, and pulmonary vascularity. Atherosclerotic calcification aorta. Minimal LEFT basilar atelectasis. Lungs otherwise clear. No pulmonary infiltrate, pleural effusion, or pneumothorax. IMPRESSION: Minimal LEFT basilar atelectasis without infiltrate. Electronically Signed   By: Lavonia Dana M.D.   On: 03/30/2022 18:57    PHYSICAL EXAM  Temp:  [98 F (36.7 C)-100.9 F (38.3 C)] 98.4 F (36.9 C) (03/29 1105) Pulse Rate:  [64-96] 88 (03/29 0738) Resp:  [25-42] 38 (03/29 1105) BP: (70-133)/(51-90) 124/82 (03/29 1105) SpO2:  [90 %-96 %] 96 % (03/29 1105) Weight:  [68 kg] 68 kg (03/29 0439)  General critically ill in no distress  Mental Status -  Seems pretty lethargic today, exam is unchanged eyes are closed.  Opens eyes to voice and noxious stimuli.  Globally aphasic he will intermittently follow commands but inconsistently.Marland Kitchen  Speech is incomprehensible.  Mild dysarthria. Cranial Nerves II - XII - II - Visual field intact blinks to threat only on the left III, IV, VI -slight left gaze preference, eyes do not cross all the way to the right V - Facial sensation intact bilaterally. VII -unable to assess VIII - Hearing & vestibular intact bilaterally. X - Palate elevates symmetrically. XI - Chin turning & shoulder shrug intact bilaterally. XII - Tongue protrusion intact.  Motor Strength -left side is purposeful.  Right side is hemiplegic Motor Tone - Muscle tone was assessed at the neck and appendages and was normal. Sensory -decreased sensation on the  right  Coordination -unable to assess  Gait and Station - deferred.  ASSESSMENT/PLAN Mr. OPIE OPALINSKI is a 77 y.o. male with history of uncontrolled diabetes,  sleep apnea, hypertension who had not been doing well for the past few days and was taken to his primary care's office for management of high blood sugars, was noted to have a sudden onset of bradycardia, unresponsiveness and vomiting at the doctor's office. EMS was called and they noted that he has a leftward gaze preference as well as right hemiplegia, and activated a code stroke and brought him to the ER.   Stroke: Large acute left PCA infarct Etiology: Large vessel disease versus cardiogenic embolism  code Stroke CT head No acute abnormality. ASPECTS 10.    CTA head & neck left P1 patient in severe stenosis of the right P2 Cerebral angio noted left posterior cerebral artery unsuccessful MRI large left acute PCA infarct with mild petechial blood products in left occipital lobe 2D Echo technically difficult study.  Left ventricular ejection fraction 50 to 75%.  Left atrium size is normal   LDL 64 HgbA1c 11.1 VTE prophylaxis -heparin subcu    There are no active orders of the following types: Diet, Nourishments.   81 mg prior  to admission, now on aspirin 81 mg and Plavix 75 mg.  Therapy recommendations: Pending Disposition: Pending.  Palliative care has discussed very guarded prognosis with patient's son and daughter.  They understand that he will not return to his previous baseline but they do wish to allow him the opportunity to to recover.  Need for PEG tube if they choose to go to nursing home has been discussed with patient's family as well.  Acute Metabolic Encephalopathy No improvement in obtunded mental status with stopping of sedation medications Continued obtunded state Will check B12, Folate Discussed with family yesterday   Fever Leukocytosis Hospitalist consult requested CXR: negative UA: pending Blood Cultures: pending  Intermittent agitation/delirium Seroquel discontinued 3/28 Delirium precautions Mittens to avoid pulling of tubes/IV lines for  medication  Dysphagia Core Track in place Tube Feeds Unasyn started for possible aspiration  Acute Kidney Injury Hypernatremia Free water flushes 239ml Q4H 146->149->153->152 D5W added, may need to increase sliding scale Daily labs Strict Intake and Output  Hypertension Home meds: Norvasc 10, losartan 100 mg, metoprolol 100 mg Stable BP goal less than 180 Long-term BP goal normotensive  Hyperlipidemia Home meds: Zetia 10 mg, atorvastatin 80 mg, resumed in hospital LDL 64, goal < 70 Continue statin at discharge  Diabetes type II UnControlled Home meds: Trulicity, Jardiance 25 mg HgbA1c 11.1, goal < 7.0 CBGs Recent Labs    03/31/22 0425 03/31/22 0806 03/31/22 1106  GLUCAP 238* 314* 231*     [facility] Diabetes management team to help.  Appreciate their assistance Will need diabetes management with PCP after discharge  Other Stroke Risk Factors Advanced Age >/= 78  Cigarette smoker advised to stop smoking Coronary artery disease Obstructive sleep apnea, on CPAP at home Congestive heart failure  Other Active Problems Gout   Hospital day # 7   Pt seen by Neuro NP/APP and later by MD. Note/plan to be edited by MD as needed.    Otelia Santee, DNP, AGACNP-BC [facility] Please use AMION for pager and EPIC for messaging  I have personally obtained history,examined this patient, reviewed notes, independently viewed imaging studies, participated in medical decision making and plan of care.ROS completed by me personally and pertinent positives fully documented  I have made any additions or clarifications directly to the above note. Agree with note above.  Patient condition is declining with obtundation and decreased mental status in the setting of worsening renal failure, hyponatremia and possible aspiration pneumonia.  Plan to get medical hospitalist team consulted.  Long discussion with patient's son over the phone about his condition and prognosis.  Family is  talking to palliative care and is realistic and would like to give him 24 to 48 hours if condition does not improve there may be any close comfort care. Greater than 50% time during this 50-minute visit were spent in counseling and coordination of care discussion with patient and family and care team and answering questions. Antony Contras, MD Medical Director [facility] Pager: [phone] 03/31/2022 2:57 PM

## 2022-03-31 NOTE — Progress Notes (Signed)
SLP Cancellation Note  Patient Details Name: Shane Jones MRN: [id number] DOB: 19-Jul-1945   Cancelled treatment:        Noted pt now obtunded per notes. Pt made NPO yesterday, has Cortrak. Will follow along for ability to initiate po's    Houston Siren 03/31/2022, 2:50 PM

## 2022-03-31 NOTE — Assessment & Plan Note (Signed)
Elevated blood pressure in the context of stroke.  Had been started on medications through tube.  Discontinue now that he is comfort care

## 2022-03-31 NOTE — Assessment & Plan Note (Addendum)
Nutrition Status: Nutrition Problem: Moderate Malnutrition Etiology: chronic illness (uncontrolled T2DM) Signs/Symptoms: moderate fat depletion, severe muscle depletion Interventions: Tube feeding, MVI, but now interventions are discontinued now that he is comfort care

## 2022-03-31 NOTE — Inpatient Diabetes Management (Addendum)
Inpatient Diabetes Program Recommendations  AACE/ADA: New Consensus Statement on Inpatient Glycemic Control (2015)  Target Ranges:  Prepandial:   less than 140 mg/dL      Peak postprandial:   less than 180 mg/dL (1-2 hours)      Critically ill patients:  140 - 180 mg/dL   Lab Results  Component Value Date   GLUCAP 314 (H) 03/31/2022   HGBA1C 11.1 (A) 03/24/2022   Review of Glycemic Control  Latest Reference Range & Units 03/30/22 07:46 03/30/22 12:36 03/30/22 15:55 03/30/22 19:54 03/30/22 23:06 03/31/22 04:25 03/31/22 08:06  Glucose-Capillary 70 - 99 mg/dL 365 (H) 384 (H) 289 (H) 222 (H) 198 (H) 238 (H) 314 (H)   Diabetes history: DM2 Outpatient Diabetes medications: Basaglar 30 QD, Jardiance 25 mg QD, Trulicity 4.5 mg weekly Current orders for Inpatient glycemic control:  Novolog 0-15 Q4H Semglee 8 units Novolog 3 units 4x/daily  Osmolite 1.5 cal 50 ml/hour  HgbA1C - 11.1%  Inpatient Diabetes Program Recommendations:    Looking at glucose trends and renal function consider:  -   Increasing Novolog Tube Feed Coverage to 4 units Q4 hours (currently 3 units 4x/day)  Will continue to follow.  Thanks,  Tama Headings RN, MSN, BC-ADM Inpatient Diabetes Coordinator Team Pager [phone] (8a-5p)

## 2022-04-01 DIAGNOSIS — E440 Moderate protein-calorie malnutrition: Secondary | ICD-10-CM

## 2022-04-01 DIAGNOSIS — N179 Acute kidney failure, unspecified: Secondary | ICD-10-CM | POA: Diagnosis not present

## 2022-04-01 DIAGNOSIS — G9341 Metabolic encephalopathy: Secondary | ICD-10-CM

## 2022-04-01 DIAGNOSIS — I639 Cerebral infarction, unspecified: Secondary | ICD-10-CM | POA: Diagnosis not present

## 2022-04-01 DIAGNOSIS — Z515 Encounter for palliative care: Secondary | ICD-10-CM

## 2022-04-01 LAB — BLOOD CULTURE ID PANEL (REFLEXED) - BCID2

## 2022-04-01 LAB — CBC WITH DIFFERENTIAL/PLATELET
Abs Immature Granulocytes: 0.15 10*3/uL — ABNORMAL HIGH (ref 0.00–0.07)
Basophils Absolute: 0.1 10*3/uL (ref 0.0–0.1)
Basophils Relative: 0 %
Eosinophils Absolute: 0 10*3/uL (ref 0.0–0.5)
Eosinophils Relative: 0 %
HCT: 45.4 % (ref 39.0–52.0)
Hemoglobin: 15.6 g/dL (ref 13.0–17.0)
Immature Granulocytes: 1 %
Lymphocytes Relative: 9 %
Lymphs Abs: 1.7 10*3/uL (ref 0.7–4.0)
MCH: 32.8 pg (ref 26.0–34.0)
MCHC: 34.4 g/dL (ref 30.0–36.0)
MCV: 95.6 fL (ref 80.0–100.0)
Monocytes Absolute: 1.1 10*3/uL — ABNORMAL HIGH (ref 0.1–1.0)
Monocytes Relative: 6 %
Neutro Abs: 15.6 10*3/uL — ABNORMAL HIGH (ref 1.7–7.7)
Neutrophils Relative %: 84 %
Platelets: 154 10*3/uL (ref 150–400)
RBC: 4.75 MIL/uL (ref 4.22–5.81)
RDW: 13.3 % (ref 11.5–15.5)
WBC: 18.7 10*3/uL — ABNORMAL HIGH (ref 4.0–10.5)
nRBC: 0 % (ref 0.0–0.2)

## 2022-04-01 LAB — FOLATE: Folate: 12.1 ng/mL (ref >5.9)

## 2022-04-01 LAB — COMPREHENSIVE METABOLIC PANEL
ALT: 19 U/L (ref 0–44)
AST: 26 U/L (ref 15–41)
Albumin: 2.1 g/dL — ABNORMAL LOW (ref 3.5–5.0)
Alkaline Phosphatase: 66 U/L (ref 38–126)
Anion gap: 12 (ref 5–15)
BUN: 110 mg/dL — ABNORMAL HIGH (ref 8–23)
CO2: 20 mmol/L — ABNORMAL LOW (ref 22–32)
Calcium: 7.8 mg/dL — ABNORMAL LOW (ref 8.9–10.3)
Chloride: 113 mmol/L — ABNORMAL HIGH (ref 98–111)
Creatinine, Ser: 3.96 mg/dL — ABNORMAL HIGH (ref 0.61–1.24)
GFR, Estimated: 15 mL/min — ABNORMAL LOW (ref >60)
Glucose, Bld: 314 mg/dL — ABNORMAL HIGH (ref 70–99)
Potassium: 4.1 mmol/L (ref 3.5–5.1)
Sodium: 145 mmol/L (ref 135–145)
Total Bilirubin: 0.7 mg/dL (ref 0.3–1.2)
Total Protein: 5.5 g/dL — ABNORMAL LOW (ref 6.5–8.1)

## 2022-04-01 LAB — MAGNESIUM: Magnesium: 3 mg/dL — ABNORMAL HIGH (ref 1.7–2.4)

## 2022-04-01 LAB — GLUCOSE, CAPILLARY
Glucose-Capillary: 253 mg/dL — ABNORMAL HIGH (ref 70–99)
Glucose-Capillary: 281 mg/dL — ABNORMAL HIGH (ref 70–99)
Glucose-Capillary: 290 mg/dL — ABNORMAL HIGH (ref 70–99)
Glucose-Capillary: 312 mg/dL — ABNORMAL HIGH (ref 70–99)

## 2022-04-01 LAB — VITAMIN B12: Vitamin B-12: 299 pg/mL (ref 180–914)

## 2022-04-01 MED ORDER — LORAZEPAM 1 MG PO TABS: 1.0000 mg | ORAL_TABLET | ORAL | Status: DC | PRN

## 2022-04-01 MED ORDER — GLYCOPYRROLATE 0.2 MG/ML IJ SOLN
0.2000 mg | INTRAMUSCULAR | Status: DC | PRN
  Administered 2022-04-01: 0.2 mg via INTRAVENOUS
  Filled 2022-04-01: qty 1

## 2022-04-01 MED ORDER — ONDANSETRON HCL 4 MG/2ML IJ SOLN: 4.0000 mg | Freq: Four times a day (QID) | INTRAMUSCULAR | Status: DC | PRN

## 2022-04-01 MED ORDER — LORAZEPAM 2 MG/ML IJ SOLN: 1.0000 mg | INTRAMUSCULAR | Status: DC | PRN

## 2022-04-01 MED ORDER — ONDANSETRON 4 MG PO TBDP: 4.0000 mg | ORAL_TABLET | Freq: Four times a day (QID) | ORAL | Status: DC | PRN

## 2022-04-01 MED ORDER — GLYCOPYRROLATE 0.2 MG/ML IJ SOLN: 0.2000 mg | INTRAMUSCULAR | Status: DC | PRN

## 2022-04-01 MED ORDER — LORAZEPAM 2 MG/ML PO CONC: 1.0000 mg | ORAL | 0 refills | Status: DC | PRN

## 2022-04-01 MED ORDER — HALOPERIDOL LACTATE 5 MG/ML IJ SOLN: 1.0000 mg | INTRAMUSCULAR | Status: DC | PRN

## 2022-04-01 MED ORDER — HYDROMORPHONE HCL 1 MG/ML IJ SOLN
0.5000 mg | INTRAMUSCULAR | Status: DC | PRN
  Administered 2022-04-01 (×3): 1 mg via INTRAVENOUS
  Filled 2022-04-01 (×3): qty 1

## 2022-04-01 MED ORDER — POLYVINYL ALCOHOL 1.4 % OP SOLN: 1.0000 [drp] | Freq: Four times a day (QID) | OPHTHALMIC | Status: DC | PRN

## 2022-04-01 MED ORDER — SODIUM CHLORIDE 0.9 % IV SOLN: INTRAVENOUS | Status: DC

## 2022-04-01 MED ORDER — HYDROMORPHONE HCL 1 MG/ML IJ SOLN: 0.5000 mg | INTRAMUSCULAR | 0 refills | Status: DC | PRN

## 2022-04-01 MED ORDER — ONDANSETRON 4 MG PO TBDP: 4.0000 mg | ORAL_TABLET | Freq: Four times a day (QID) | ORAL | 0 refills | Status: DC | PRN

## 2022-04-01 MED ORDER — LORAZEPAM 2 MG/ML PO CONC: 1.0000 mg | ORAL | Status: DC | PRN

## 2022-04-01 MED ORDER — GLYCOPYRROLATE 1 MG PO TABS: 1.0000 mg | ORAL_TABLET | ORAL | Status: DC | PRN

## 2022-04-01 MED ORDER — BIOTENE DRY MOUTH MT LIQD: 15.0000 mL | OROMUCOSAL | Status: DC | PRN

## 2022-04-01 NOTE — Progress Notes (Signed)
Per Palliative Medicine APP, pt's family is agreeable to comfort care and is requesting hospice home placement at [facility]. Referral given to [facility] with [facility] who will eval pt and f/u with pt's family.   Wandra Feinstein, MSW, LCSW [phone] (coverage)

## 2022-04-01 NOTE — Progress Notes (Signed)
[facility] Progress Note  [name]    [id number]  DOA: 03/24/2022    Date of Service: the patient was seen and examined on 04/01/2022  Brief hospital course: Patient is a 77 year old male with past medical history of uncontrolled diabetes, stage IIIb CKD, sleep apnea and hypertension who had not been feeling well for several days and was seen by his PCP on 3/22 for worsening symptoms.  While in the office, patient was noted to have sudden onset bradycardia, unresponsiveness and vomiting.  EMS called and patient noted to have a leftward gaze as well as right hemiplegia and was brought to the emergency department as a code stroke and patient found to have a large acute left PCA infarct.  Patient not felt to be a tPA candidate because of unclear time of when he was last known well.  Felt to possibly be candidate for thrombectomy.  Admitted to the ICU and intubated.  Following admission, patient became hypotensive requiring pressor support.  Patient taken that same day by interventional radiology and unfortunately, multiple attempts to access left PCA unsuccessful due to significant tortuosity at multiple sites.  Patient able to be extubated by 3/24, however despite being off of sedation for some time, remains unresponsive, at times occasional restless.  Patient evaluated by speech therapy and found to be unable to swallow.  Cortrak tube placed for feeding 3/25.  By 3/27, patient neurological exam but still unchanged with left gaze preference and right hemiplegia.  Palliative care consulted and met with family on 3/28.  Renal function continued to decline over next few days.  Hospitalists consulted with cause being free water deficit, decreased intake and developing organ system failure.  With patient's labs continuing to decline, patient made full comfort measures on 3/30.  Patient being evaluated by hospice facility.   Assessment and Plan: * Acute ischemic stroke ([place]) Large  devastating left PCA infarct.  Patient unresponsive.  Now comfort care.  Acute metabolic encephalopathy -Progressive neurologic impairment, with etiology being devastating CVA.  Patient not expected to make recovery.  Now comfort care.  AKI (acute kidney injury) ([place]) With hypernatremia -Free water deficit is 2.9L.  Initially had been placed on fluids, but now with worsening labs patient now comfort care.  No further lab work, no need for fluids.  Dysphagia -Prior concern for aspiration, now comfort care  Hypertension Elevated blood pressure in the context of stroke.  Had been started on medications through tube.  Discontinue now that he is comfort care  Hyperlipidemia -Had been on atorvastatin per tube until discontinued for comfort care  Diabetes mellitus without complication ([place]) -Continue glargine and moderate-scale SSI.  Now discontinued for being comfort care  Sleep apnea -Not on CPAP at home  Malnutrition of moderate degree ([place]) Nutrition Status: Nutrition Problem: Moderate Malnutrition Etiology: chronic illness (uncontrolled T2DM) Signs/Symptoms: moderate fat depletion, severe muscle depletion Interventions: Tube feeding, MVI, but now interventions are discontinued now that he is comfort care     Overweight Meets criteria BMI greater than 25       Body mass index is 25.86 kg/m.  Nutrition Problem: Moderate Malnutrition Etiology: chronic illness (uncontrolled T2DM)     Consultants: Critical care Palliative care Neurology Interventional radiology  Procedures: Echocardiogram Core track feeding tube Attempted thrombectomy  Antimicrobials: None  Code Status: DNR, now comfort care   Subjective: Patient not really responsive  Objective: Vital signs were reviewed and unremarkable. Vitals:   04/01/22 0814 04/01/22 1108  BP: 139/73 121/72  Pulse: 87 65  Resp: (!) 21 (!) 35  Temp: 100.1 F (37.8 C) 100 F (37.8 C)  SpO2: 96% 96%     Intake/Output Summary (Last 24 hours) at 04/01/2022 1419 Last data filed at 04/01/2022 0448 Gross per 24 hour  Intake 793.1 ml  Output 450 ml  Net 343.1 ml   Filed Weights   03/29/22 0714 03/31/22 0439 04/01/22 0900  Weight: 77.8 kg 68 kg 70.5 kg   Body mass index is 25.86 kg/m.  Exam:  General: Unresponsive HEENT: Normocephalic, atraumatic, mucous membranes are dry Cardiovascular: Regular rate and rhythm, S1-S2 Respiratory: Few Rales, tachypneic Abdomen: Soft, nondistended, hypoactive bowel sounds Musculoskeletal: No clubbing or cyanosis, trace pitting edema Skin: No skin breaks, tears or lesions   Data Reviewed: Creatinine now at 3.96 with BUN of 110.  Disposition:  Status is: Inpatient  Anticipated discharge date: 3/31  Remaining issues to be resolved so that patient can be discharged:  Hospice bed facility available   Family Communication: Will call family DVT Prophylaxis: DVT prophylaxis discontinued now that he is comfort care    Author: Annita Brod ,MD 04/01/2022 2:19 PM  To reach On-call, see care teams to locate the attending and reach out via [email or web address]. Between 7PM-7AM, please contact night-coverage If you still have difficulty reaching the attending provider, please page the [facility] (Director on Call) for [facility] on amion for assistance.

## 2022-04-01 NOTE — Progress Notes (Signed)
[place] [id number] [facility]) Hospice hospital liaison note   Referral received for Hospice Home.    Talked with family to explain services and hospice philosophy. Hospice home is able to accept patient this evening and transport has been called.     RN staff, you may call report at any time to [phone], room is assigned when report is called.  Please leave IV intact.    Please send completed DNR with patient.   Updated attending and [facility] manager via [place]. Thank you for the opportunity to participate in this patient's care Jhonnie Garner, BSN, RN Hospice nurse liaison [phone]

## 2022-04-01 NOTE — Assessment & Plan Note (Signed)
Meets criteria BMI greater than 25 

## 2022-04-01 NOTE — Progress Notes (Signed)
PHARMACY - PHYSICIAN COMMUNICATION CRITICAL VALUE ALERT - BLOOD CULTURE IDENTIFICATION (BCID)  Shane Jones is an 77 y.o. male who presented to [facility] on 03/24/2022 with a chief complaint of sudden onset AMS/bradycardia/vomiting while at Dr office for mgmgnt of hyperglycemia   Assessment:  1/4 blood cultures positive for staph epi with [place] A resistance - most likely a contaminant   Name of physician (or Provider) Contacted: Dr. Erlinda Hong  Current antibiotics: None  Changes to prescribed antibiotics recommended:  Would not recommend adding antibiotics for this culture result.  Results for orders placed or performed during the hospital encounter of 03/24/22  Blood Culture ID Panel (Reflexed) (Collected: 03/31/2022 12:54 PM)  Result Value Ref Range   Enterococcus faecalis NOT DETECTED NOT DETECTED   Enterococcus Faecium NOT DETECTED NOT DETECTED   Listeria monocytogenes NOT DETECTED NOT DETECTED   Staphylococcus species DETECTED (A) NOT DETECTED   Staphylococcus aureus (BCID) NOT DETECTED NOT DETECTED   Staphylococcus epidermidis DETECTED (A) NOT DETECTED   Staphylococcus lugdunensis NOT DETECTED NOT DETECTED   Streptococcus species NOT DETECTED NOT DETECTED   Streptococcus agalactiae NOT DETECTED NOT DETECTED   Streptococcus pneumoniae NOT DETECTED NOT DETECTED   Streptococcus pyogenes NOT DETECTED NOT DETECTED   A.calcoaceticus-baumannii NOT DETECTED NOT DETECTED   Bacteroides fragilis NOT DETECTED NOT DETECTED   Enterobacterales NOT DETECTED NOT DETECTED   Enterobacter cloacae complex NOT DETECTED NOT DETECTED   Escherichia coli NOT DETECTED NOT DETECTED   Klebsiella aerogenes NOT DETECTED NOT DETECTED   Klebsiella oxytoca NOT DETECTED NOT DETECTED   Klebsiella pneumoniae NOT DETECTED NOT DETECTED   Proteus species NOT DETECTED NOT DETECTED   Salmonella species NOT DETECTED NOT DETECTED   Serratia marcescens NOT DETECTED NOT DETECTED   Haemophilus influenzae NOT DETECTED NOT  DETECTED   Neisseria meningitidis NOT DETECTED NOT DETECTED   Pseudomonas aeruginosa NOT DETECTED NOT DETECTED   Stenotrophomonas maltophilia NOT DETECTED NOT DETECTED   Candida albicans NOT DETECTED NOT DETECTED   Candida auris NOT DETECTED NOT DETECTED   Candida glabrata NOT DETECTED NOT DETECTED   Candida krusei NOT DETECTED NOT DETECTED   Candida parapsilosis NOT DETECTED NOT DETECTED   Candida tropicalis NOT DETECTED NOT DETECTED   Cryptococcus neoformans/gattii NOT DETECTED NOT DETECTED   Methicillin resistance mecA/C DETECTED (A) NOT DETECTED    Corinda Gubler 04/01/2022  1:19 PM

## 2022-04-01 NOTE — Discharge Summary (Signed)
Physician Discharge Summary   [name] MRN: [id number] DOB: 1945/12/19  Admit date:     03/24/2022  Anticipated discharge date: 04/01/22  Discharge Physician: Annita Brod   PCP: Leone Haven, MD   Recommendations at discharge:   Note that patient's home medications have all been discontinued. Patient is being discharged to hospice facility New medications in regards to patient's hospice status: As needed Robinul, Haldol, Dilaudid, Ativan, Zofran  Discharge Diagnoses: Principal Problem:   Acute ischemic stroke [facility]) Active Problems:   Acute metabolic encephalopathy   AKI (acute kidney injury) ([place])   Dysphagia   Hypertension   Diabetes mellitus without complication ([place])   Hyperlipidemia   Sleep apnea   Malnutrition of moderate degree ([place])   Overweight   Hospice care patient  Resolved Problems:   * No resolved hospital problems. [facility] Hospital Course: Patient is a 77 year old male with past medical history of uncontrolled diabetes, stage IIIb CKD, sleep apnea and hypertension who had not been feeling well for several days and was seen by his PCP on 3/22 for worsening symptoms.  While in the office, patient was noted to have sudden onset bradycardia, unresponsiveness and vomiting.  EMS called and patient noted to have a leftward gaze as well as right hemiplegia and was brought to the emergency department as a code stroke and patient found to have a large acute left PCA infarct.  Patient not felt to be a tPA candidate because of unclear time of when he was last known well.  Felt to possibly be candidate for thrombectomy.  Admitted to the ICU and intubated.  Following admission, patient became hypotensive requiring pressor support.  Patient taken that same day by interventional radiology and unfortunately, multiple attempts to access left PCA unsuccessful due to significant tortuosity at multiple sites.  Patient able to be extubated by 3/24, however despite being  off of sedation for some time, remains unresponsive, at times occasional restless.  Patient evaluated by speech therapy and found to be unable to swallow.  Cortrak tube placed for feeding 3/25.  By 3/27, patient neurological exam but still unchanged with left gaze preference and right hemiplegia.  Palliative care consulted and met with family on 3/28.  Renal function continued to decline over next few days.  Hospitalists consulted with cause being free water deficit, decreased intake and developing organ system failure.  With patient's labs continuing to decline, patient made full comfort measures on 3/30.  Patient being evaluated by hospice facility.  Assessment and Plan: * Acute ischemic stroke ([place]) Large devastating left PCA infarct.  Patient unresponsive.  Now comfort care.  Acute metabolic encephalopathy -Progressive neurologic impairment, with etiology being devastating CVA.  Patient not expected to make recovery.  Now comfort care.  AKI (acute kidney injury) ([place]) With hypernatremia -Free water deficit is 2.9L.  Initially had been placed on fluids, but now with worsening labs patient now comfort care.  No further lab work, no need for fluids.  Dysphagia -Prior concern for aspiration, now comfort care  Hypertension Elevated blood pressure in the context of stroke.  Had been started on medications through tube.  Discontinue now that he is comfort care  Hyperlipidemia -Had been on atorvastatin per tube until discontinued for comfort care  Diabetes mellitus without complication ([place]) -Continue glargine and moderate-scale SSI.  Now discontinued for being comfort care  Sleep apnea -Not on CPAP at home  Malnutrition of moderate degree ([place]) Nutrition Status: Nutrition Problem: Moderate Malnutrition Etiology: chronic  illness (uncontrolled T2DM) Signs/Symptoms: moderate fat depletion, severe muscle depletion Interventions: Tube feeding, MVI, but now interventions are discontinued  now that he is comfort care     Overweight Meets criteria BMI greater than 25         Consultants: Critical care Palliative care Neurology Interventional radiology   Procedures: Echocardiogram Core track feeding tube Attempted thrombectomy Ventilator support 3/22 - 3/25  Disposition: Hospice facility Diet recommendation:  N.p.o.  DISCHARGE MEDICATION: Allergies as of 04/01/2022       Reactions   Uloric [febuxostat] Hives        Medication List     STOP taking these medications    allopurinol 100 MG tablet Commonly known as: ZYLOPRIM   amLODipine 10 MG tablet Commonly known as: NORVASC   aspirin EC 81 MG tablet   atorvastatin 80 MG tablet Commonly known as: LIPITOR   Basaglar KwikPen 100 UNIT/ML   blood glucose meter kit and supplies   empagliflozin 25 MG Tabs tablet Commonly known as: Jardiance   escitalopram 20 MG tablet Commonly known as: LEXAPRO   ezetimibe 10 MG tablet Commonly known as: Zetia   fluticasone 50 MCG/ACT nasal spray Commonly known as: FLONASE   FreeStyle Libre 2 Sensor Misc   furosemide 20 MG tablet Commonly known as: LASIX   gabapentin 400 MG capsule Commonly known as: NEURONTIN   losartan 100 MG tablet Commonly known as: COZAAR   metoprolol succinate 100 MG 24 hr tablet Commonly known as: TOPROL-XL   pantoprazole 40 MG tablet Commonly known as: PROTONIX   Pen Needles 32G X 4 MM Misc   Trulicity 4.5 0000000 Sopn Generic drug: Dulaglutide       TAKE these medications    glycopyrrolate 0.2 MG/ML injection Commonly known as: ROBINUL Inject 1 mL (0.2 mg total) into the skin every 4 (four) hours as needed (excessive secretions).   haloperidol lactate 5 MG/ML injection Commonly known as: HALDOL Inject 0.2 mLs (1 mg total) into the vein every 4 (four) hours as needed.   HYDROmorphone 1 MG/ML injection Commonly known as: DILAUDID Inject 0.5-1 mLs (0.5-1 mg total) into the vein every 2 (two) hours as  needed for severe pain (or dyspnea).   LORazepam 2 MG/ML concentrated solution Commonly known as: ATIVAN Place 0.5 mLs (1 mg total) under the tongue every 4 (four) hours as needed for anxiety.   ondansetron 4 MG disintegrating tablet Commonly known as: ZOFRAN-ODT Take 1 tablet (4 mg total) by mouth every 6 (six) hours as needed for nausea.        Discharge Exam: Filed Weights   03/29/22 [id number] 03/31/22 0439 04/01/22 0900  Weight: 77.8 kg 68 kg 70.5 kg   General: Minimal responsiveness, no acute distress Cardiovascular: Regular rate and rhythm, S1-S2  Condition at discharge:  Patient is dying.  Likely will pass within the next 1 week.  The results of significant diagnostics from this hospitalization (including imaging, microbiology, ancillary and laboratory) are listed below for reference.   Imaging Studies: DG CHEST PORT 1 VIEW  Result Date: 03/31/2022 CLINICAL DATA:  Fever, leukocytosis. EXAM: PORTABLE CHEST 1 VIEW COMPARISON:  03/30/2022. FINDINGS: 1302 hours. Feeding tube courses below the diaphragm, beyond the field of view. Rotated patient. No focal airspace opacity. Stable cardiac and mediastinal contours accounting for patient rotation. No pleural effusion or pneumothorax. IMPRESSION: No evidence of acute cardiopulmonary disease. Feeding tube courses below the diaphragm, beyond the field of view. Electronically Signed   By: Lyndal Rainbow.D.  On: 03/31/2022 13:24   CT HEAD WO CONTRAST (5MM)  Result Date: 03/30/2022 CLINICAL DATA:  Stroke follow-up EXAM: CT HEAD WITHOUT CONTRAST TECHNIQUE: Contiguous axial images were obtained from the base of the skull through the vertex without intravenous contrast. RADIATION DOSE REDUCTION: This exam was performed according to the departmental dose-optimization program which includes automated exposure control, adjustment of the mA and/or kV according to patient size and/or use of iterative reconstruction technique. COMPARISON:  None  Available. FINDINGS: Brain: Early subacute infarct of the entirety of the left PCA territory with associated cytotoxic edema. No acute hemorrhage. No midline shift or other mass effect. No hydrocephalus. Vascular: No abnormal hyperdensity of the major intracranial arteries or dural venous sinuses. No intracranial atherosclerosis. Skull: The visualized skull base, calvarium and extracranial soft tissues are normal. Sinuses/Orbits: No fluid levels or advanced mucosal thickening of the visualized paranasal sinuses. No mastoid or middle ear effusion. The orbits are normal. IMPRESSION: Early subacute infarct of the entirety of the left PCA territory with associated cytotoxic edema. No acute hemorrhage. Electronically Signed   By: Ulyses Jarred M.D.   On: 03/30/2022 19:01   DG Chest 2 View  Result Date: 03/30/2022 CLINICAL DATA:  Pneumonia EXAM: CHEST TWO VIEWS COMPARISON:  03/25/2022 FINDINGS: Feeding tube traverses esophagus and stomach. Normal heart size, mediastinal contours, and pulmonary vascularity. Atherosclerotic calcification aorta. Minimal LEFT basilar atelectasis. Lungs otherwise clear. No pulmonary infiltrate, pleural effusion, or pneumothorax. IMPRESSION: Minimal LEFT basilar atelectasis without infiltrate. Electronically Signed   By: Lavonia Dana M.D.   On: 03/30/2022 18:57   DG Swallowing Func-Speech Pathology  Result Date: 03/28/2022 Table formatting from the original result was not included. Modified Barium Swallow Study Patient Details Name: Shane Jones MRN: [id number] Date of Birth: 12-15-1945 Today's Date: 03/28/2022 HPI/PMH: HPI: Shane Jones is a 77 y.o. male past medical history of uncontrolled diabetes, sleep apnea, hypertension who had not been doing well for the past few days and was taken to his primary care's office for management of high blood sugars, was noted to have a sudden onset of bradycardia, unresponsiveness and vomiting at the doctor's office. EMS was called and they noted that  he has a leftward gaze preference as well as right hemiplegia, and activated a code stroke and brought him to the ER.MRI head revealed on 03/25/22 Large acute left PCA distribution infarct. Associated mild petechial blood products at the left occipital lobe without significant mass effect. SLE/BSE generated to assess swallow and speech/language function. Clinical Impression: Clinical Impression: Pt exhibited moderate oropharyngeal dysphagia with cues needed to attend due to distractibility and right inattention. There was significantly decreased oral control with bolus spilling under tongue, oral transit  delays and lingual residue with all textures but moderate amount with honey barium. Independent subsequent swallows pt able to reduce resdiue to min-mild. Pharyngeally, there was aspiration with teaspoon thin liquid that spilled from his pyriform sinuses with immediate cough. Cup sip nectar thick aspirated during the swallow due to reduced laryngeal elevation and closure without sensation. Pt was unable to perform compensatory strategies given decreased sustained attention. Majority of pharyngeal residue was in his vallecuale and ranged from min-moderate with spontaneous swallows inconsistently decreasing to mild. Brief esophageal scan was unremarkable for significant findings. Recommend pt initiate puree (Dys 1), honey thick liquid, check oral cavity for residue and will need FULL supervision to attend to meals and provide cueing, check for  oral clearance. Pills crushed in applesauce. ST to continue. Factors that may increase risk  of adverse event in presence of aspiration ([place] 2021): Factors that may increase risk of adverse event in presence of aspiration Phineas Douglas & Padilla 2021): Reduced cognitive function (aphasia) Recommendations/Plan: Swallowing Evaluation Recommendations Swallowing Evaluation Recommendations Recommendations: PO diet PO Diet Recommendation: Dysphagia 1 (Pureed); Moderately thick  liquids (Level 3, honey thick) Liquid Administration via: Cup Medication Administration: Crushed with puree Supervision: Staff to assist with self-feeding; Full supervision/cueing for swallowing strategies Swallowing strategies  : Slow rate; Small bites/sips; Check for pocketing or oral holding Postural changes: Stay upright 30-60 min after meals Oral care recommendations: Oral care BID (2x/day) Treatment Plan Treatment Plan Treatment recommendations: Therapy as outlined in treatment plan below Follow-up recommendations: Acute inpatient rehab (3 hours/day) Functional status assessment: Patient has had a recent decline in their functional status and demonstrates the ability to make significant improvements in function in a reasonable and predictable amount of time. Treatment frequency: Min 2x/week Treatment duration: 2 weeks Interventions: Aspiration precaution training; Compensatory techniques; Patient/family education; Trials of upgraded texture/liquids; Diet toleration management by SLP Recommendations Recommendations for follow up therapy are one component of a multi-disciplinary discharge planning process, led by the attending physician.  Recommendations may be updated based on patient status, additional functional criteria and insurance authorization. Assessment: Orofacial Exam: Orofacial Exam Oral Cavity: Oral Hygiene: WFL Oral Cavity - Dentition: Edentulous Orofacial Anatomy: Other (comment) Oral Motor/Sensory Function: Unable to test Anatomy: Anatomy: Suspected cervical osteophytes Boluses Administered: Boluses Administered Boluses Administered: Thin liquids (Level 0); Mildly thick liquids (Level 2, nectar thick); Moderately thick liquids (Level 3, honey thick); Puree  Oral Impairment Domain: Oral Impairment Domain Lip Closure: No labial escape Tongue control during bolus hold: Escape to lateral buccal cavity/floor of mouth Bolus preparation/mastication: -- (NT solid) Bolus transport/lingual motion: Delayed  initiation of tongue motion (oral holding) Oral residue: Residue collection on oral structures; Majority of bolus remaining (majority remainng with honey) Location of oral residue : Floor of mouth; Tongue Initiation of pharyngeal swallow : Pyriform sinuses; Valleculae  Pharyngeal Impairment Domain: Pharyngeal Impairment Domain Soft palate elevation: No bolus between soft palate (SP)/pharyngeal wall (PW) Laryngeal elevation: Partial superior movement of thyroid cartilage/partial approximation of arytenoids to epiglottic petiole Anterior hyoid excursion: Partial anterior movement Epiglottic movement: Complete inversion Laryngeal vestibule closure: Incomplete, narrow column air/contrast in laryngeal vestibule Pharyngeal stripping wave : Present - complete Pharyngeal contraction (A/P view only): N/A Pharyngoesophageal segment opening: Complete distension and complete duration, no obstruction of flow Tongue base retraction: Narrow column of contrast or air between tongue base and PPW Pharyngeal residue: Collection of residue within or on pharyngeal structures Location of pharyngeal residue: Valleculae; Pyriform sinuses  Esophageal Impairment Domain: Esophageal Impairment Domain Esophageal clearance upright position: Complete clearance, esophageal coating Pill: Esophageal Impairment Domain Esophageal clearance upright position: Complete clearance, esophageal coating Penetration/Aspiration Scale Score: Penetration/Aspiration Scale Score 7.  Material enters airway, passes BELOW cords and not ejected out despite cough attempt by patient: Thin liquids (Level 0) 8.  Material enters airway, passes BELOW cords without attempt by patient to eject out (silent aspiration) : Mildly thick liquids (Level 2, nectar thick) Compensatory Strategies: Compensatory Strategies Compensatory strategies: No (pt not appropriate)   General Information: Caregiver present: No  Diet Prior to this Study: NPO   Temperature : Normal   Respiratory  Status: WFL   Supplemental O2: None (Room air)   History of Recent Intubation: Yes  Behavior/Cognition: Alert; Impulsive; Distractible; Requires cueing; Pleasant mood; Confused; Cooperative Self-Feeding Abilities: Dependent for feeding Baseline vocal quality/speech: -- (no vocalizations)  Volitional Cough: Unable to elicit Volitional Swallow: Unable to elicit No data recorded Goal Planning: Prognosis for improved oropharyngeal function: Fair Barriers to Reach Goals: Cognitive deficits (communication impairments) No data recorded Patient/Family Stated Goal: Eat/drink; improve overall Consulted and agree with results and recommendations: Pt unable/family or caregiver not available; Nurse Pain: Pain Assessment Pain Assessment: Faces Faces Pain Scale: 0 Breathing: 0 Negative Vocalization: 0 Facial Expression: 0 Body Language: 1 Consolability: 1 PAINAD Score: 2 Facial Expression: 0 Body Movements: 0 Muscle Tension: 0 Compliance with ventilator (intubated pts.): N/A Vocalization (extubated pts.): N/A CPOT Total: 0 Pain Location: generalized discomfort Pain Descriptors / Indicators: Discomfort; Grimacing; Guarding Pain Intervention(s): Limited activity within patient's tolerance; Monitored during session End of Session: Start Time:SLP Start Time (ACUTE ONLY): 1017 Stop Time: SLP Stop Time (ACUTE ONLY): 1031 Time Calculation:SLP Time Calculation (min) (ACUTE ONLY): 14 min Charges: SLP Evaluations $ SLP Speech Visit: 1 Visit SLP Evaluations $BSS Swallow: 1 Procedure $MBS Swallow: 1 Procedure $ SLP EVAL LANGUAGE/SOUND PRODUCTION: 1 Procedure $Swallowing Treatment: 1 Procedure $Speech Treatment for Individual: 1 Procedure SLP visit diagnosis: SLP Visit Diagnosis: Dysphagia, oropharyngeal phase (R13.12) Past Medical History: Past Medical History: Diagnosis Date  Diabetes mellitus without complication (HCC)   Gout   Hypertension   Sleep apnea   has CPAP, doesn't use  Ulcer of esophagus without bleeding   Wears dentures   full  upper and lower Past Surgical History: Past Surgical History: Procedure Laterality Date  BALLOON DILATION N/A 10/22/2017  Procedure: BALLOON DILATION;  Surgeon: Lucilla Lame, MD;  Location: [place];  Service: Endoscopy;  Laterality: N/A;  CATARACT EXTRACTION W/PHACO Right 02/02/2022  Procedure: CATARACT EXTRACTION PHACO AND INTRAOCULAR LENS PLACEMENT (IOC) RIGHT DIABETIC  6.48  00:45.6;  Surgeon: Norvel Richards, MD;  Location: [place];  Service: Ophthalmology;  Laterality: Right;  Diabetic  CATARACT EXTRACTION W/PHACO Left 02/16/2022  Procedure: CATARACT EXTRACTION PHACO AND INTRAOCULAR LENS PLACEMENT ([place]) LEFT DIABTIC  7.58  00:46.2;  Surgeon: Norvel Richards, MD;  Location: [place];  Service: Ophthalmology;  Laterality: Left;  Diabetic  ESOPHAGOGASTRODUODENOSCOPY (EGD) WITH PROPOFOL N/A 10/22/2017  Procedure: ESOPHAGOGASTRODUODENOSCOPY (EGD) WITH Biopsy;  Surgeon: Lucilla Lame, MD;  Location: [place];  Service: Endoscopy;  Laterality: N/A;  IR PERCUTANEOUS ART THROMBECTOMY/INFUSION INTRACRANIAL INC DIAG ANGIO  03/24/2022  RADIOLOGY WITH ANESTHESIA N/A 03/24/2022  Procedure: IR WITH ANESTHESIA;  Surgeon: Radiologist, Medication, MD;  Location: [place];  Service: Radiology;  Laterality: N/A;  TONSILLECTOMY   [place] Siren 03/28/2022, 12:03 PM  IR PERCUTANEOUS ART THROMBECTOMY/INFUSION INTRACRANIAL INC DIAG ANGIO  Result Date: 03/28/2022 INDICATION: Right-sided hemiplegia and neglect. Occluded left posterior cerebral artery on CT angiogram of the head and neck. EXAM: 1. EMERGENT LARGE VESSEL OCCLUSION THROMBOLYSIS (POSTERIOR CIRCULATION) COMPARISON:  CT angiogram of the head and neck of 03/24/2022. MEDICATIONS: Ancef 2 g IV antibiotic was administered within 1 hour of the procedure. ANESTHESIA/SEDATION: General anesthesia. CONTRAST:  Omnipaque 300 approximately 100 mL. FLUOROSCOPY TIME:  Fluoroscopy Time: 29 minutes 0 seconds (1362 mGy).  COMPLICATIONS: None immediate. TECHNIQUE: Following a full explanation of the procedure along with the potential associated complications, an informed witnessed consent was obtained. The risks of intracranial hemorrhage of 10%, worsening neurological deficit, ventilator dependency, death and inability to revascularize were all reviewed in detail with the patient's son. The patient was then put under general anesthesia by the Department of Anesthesiology at [facility]. The right groin was prepped and draped in the usual sterile fashion. Thereafter using  modified Seldinger technique, transfemoral access into the right common femoral artery was obtained without difficulty. Over an 0.035 inch guidewire an 8 French 25 cm sheath was inserted. Through this, and also over an 0.035 inch guidewire a 5 [place] JB 1 diagnostic catheter was advanced to the aortic arch region, and selectively positioned in the left subclavian artery. FINDINGS: A control injection performed through the Wickliffe 1 catheter in the left subclavian artery. Severe tortuosity of the aortic arch at the origin of the left subclavian artery. The dominant left vertebral artery origin is widely patent. The vessel demonstrates prominent tortuosity in its proximal 1/3 with opacification noted distally to the cranial skull base. The vessel is seen to opacify to the cranial skull base. Patency is seen of the left vertebrobasilar junction. Wide patency is seen of the basilar artery the proximal right PCA, the superior cerebellar arteries and the anterior-inferior cerebellar arteries into the capillary and venous phases. Mild-to-moderate areas of arteriosclerotic narrowing is evident in the superior cerebellar arteries proximally and the anterior-inferior cerebellar artery/left posterior-inferior cerebellar artery complex. High-grade stenosis evident in the P2 segment of the right posterior cerebral artery. Complete occlusion of the left posterior cerebral  artery its origin is noted. PROCEDURE: The diagnostic JB 1 catheter in the distal left subclavian artery was exchanged over an 035 inch 300 cm Rosen exchange guidewire for a 6 [place] MDP Envoy guide catheter. The guidewire was removed. Good aspiration obtained from the hub of the guide catheter. The catheter was then gently retrieved to the origin of the left vertebral artery. Through this, and over an 014 inch and then an over an 018 inch micro guidewire with a moderate J configuration, an 031 160 cm microcatheter was advanced to the proximal 1/3 of the dominant vertebral artery. Access of the microcatheter despite distal presence of the micro guidewire was met with herniation of the platform into the aortic arch. Attempts were then made with a the use of a combination of an [place] 25 cm slip catheter inside of an 8 [place] Neuron Max guide sheath just proximal to the origin of the left vertebral artery. Advancement of this was obtained with difficulty into the proximal 1/3 of the right vertebral artery. However, distal access of the 4 French balloon guide and on the Neuron Max sheath could not be obtained despite use of an 035 inch Roadrunner guidewire, and a glidewire. Severe tortuosity of the subclavian artery at its origin from the aortic arch, and also of the left vertebral artery in the proximal 1/3 resulting in herniation of the platform into the aortic arch. The diagnostic catheter was advanced to the innominate artery and the right subclavian artery. The innominate arteriogram demonstrated prominent tortuosity of the proximal innominate artery, and the right subclavian artery. Hypoplastic right vertebral artery was noted opacifying to the cranial skull base. The right common carotid arteriogram demonstrates the right external carotid artery and its major branches to be widely patent. The right internal carotid artery at the bulb to the cranial skull base demonstrates wide patency. U-shaped configuration of  the proximal right ICA was noted. An 8 French Angio-Seal closure device was then deployed at the right groin puncture site. Distal pulses remained Dopplerable in both feet unchanged. Patient was left intubated due to his medical condition. He was then transferred to the neuro ICU for post procedural care. IMPRESSION: Status post unsuccessful attempt at revascularization of occluded left posterior cerebral artery P1 segment anatomical tortuosity of the aortic arch, and the left subclavian  artery and of the proximal 1/3 of the dominant left vertebral artery. PLAN: As per referring MD. Electronically Signed   By: Luanne Bras M.D.   On: 03/28/2022 10:36   ECHOCARDIOGRAM COMPLETE  Result Date: 03/27/2022    ECHOCARDIOGRAM REPORT   Patient Name:   Shane Jones Date of Exam: 03/27/2022 Medical Rec #:  [id number]    Height:       65.0 in Accession #:    [id number]   Weight:       171.5 lb Date of Birth:  12-23-1945    BSA:          1.853 m Patient Age:    57 years     BP:           206/101 mmHg Patient Gender: M            HR:           71 bpm. Exam Location:  Inpatient Procedure: 2D Echo, Color Doppler and Cardiac Doppler Indications:    Stroke i63.9  History:        Patient has no prior history of Echocardiogram examinations.                 Risk Factors:Hypertension, Diabetes, Dyslipidemia and Sleep                 Apnea.  Sonographer:    Raquel Sarna Senior RDCS Referring Phys: [id number] ASHISH ARORA  Sonographer Comments: Very poor windows due to lung interference, patient moving around during exam. IMPRESSIONS  1. Technically difficult study with limited visualization.  2. Left ventricular ejection fraction, by estimation, is 50 to 55%. The left ventricle has low normal function. The left ventricle has no regional wall motion abnormalities. Left ventricular diastolic parameters are consistent with Grade I diastolic dysfunction (impaired relaxation).  3. Right ventricular systolic function is normal. The right  ventricular size is normal.  4. The mitral valve is normal in structure. No evidence of mitral valve regurgitation. No evidence of mitral stenosis.  5. The aortic valve was not well visualized. There is mild calcification of the aortic valve. There is mild thickening of the aortic valve. Aortic valve regurgitation is not visualized. No aortic stenosis is present.  6. The inferior vena cava is less than 1cm. FINDINGS  Left Ventricle: Left ventricular ejection fraction, by estimation, is 50 to 55%. The left ventricle has low normal function. The left ventricle has no regional wall motion abnormalities. The left ventricular internal cavity size was normal in size. There is no left ventricular hypertrophy. Left ventricular diastolic parameters are consistent with Grade I diastolic dysfunction (impaired relaxation). Right Ventricle: The right ventricular size is normal. No increase in right ventricular wall thickness. Right ventricular systolic function is normal. Left Atrium: Left atrial size was normal in size. Right Atrium: Right atrial size was normal in size. Pericardium: There is no evidence of pericardial effusion. Mitral Valve: The mitral valve is normal in structure. No evidence of mitral valve regurgitation. No evidence of mitral valve stenosis. Tricuspid Valve: The tricuspid valve is normal in structure. Tricuspid valve regurgitation is not demonstrated. No evidence of tricuspid stenosis. Aortic Valve: The aortic valve was not well visualized. There is mild calcification of the aortic valve. There is mild thickening of the aortic valve. Aortic valve regurgitation is not visualized. No aortic stenosis is present. Pulmonic Valve: The pulmonic valve was not well visualized. Pulmonic valve regurgitation is not visualized. No evidence of pulmonic stenosis. Aorta: The  aortic root is normal in size and structure. Venous: The inferior vena cava is normal in size with greater than 50% respiratory variability,  suggesting right atrial pressure of 3 mmHg. IAS/Shunts: No atrial level shunt detected by color flow Doppler.   Diastology LV e' medial:    5.87 cm/s LV E/e' medial:  12.0 LV e' lateral:   7.94 cm/s LV E/e' lateral: 8.9  RIGHT VENTRICLE RV S prime:     18.00 cm/s TAPSE (M-mode): 2.1 cm LEFT ATRIUM             Index        RIGHT ATRIUM          Index LA Vol (A2C):   39.4 ml 21.26 ml/m  RA Area:     8.26 cm LA Vol (A4C):   24.5 ml 13.22 ml/m  RA Volume:   12.60 ml 6.80 ml/m LA Biplane Vol: 31.1 ml 16.78 ml/m  AORTIC VALVE LVOT Vmax:   92.20 cm/s LVOT Vmean:  60.000 cm/s LVOT VTI:    0.183 m MITRAL VALVE MV Area (PHT): 2.39 cm     SHUNTS MV Decel Time: 317 msec     Systemic VTI: 0.18 m MV E velocity: 70.40 cm/s MV A velocity: 106.00 cm/s MV E/A ratio:  0.66 Aditya Sabharwal Electronically signed by Hebert Soho Signature Date/Time: 03/27/2022/2:13:31 PM    Final    DG Abd Portable 1V  Result Date: 03/27/2022 CLINICAL DATA:  Feeding tube placement EXAM: PORTABLE ABDOMEN - 1 VIEW limited for tube placement COMPARISON:  None Available. FINDINGS: Limited x-ray of the upper abdomen has Dobbhoff tube with the tip extending into the right upper quadrant this could be proximal duodenal. There is limited slack in the stomach. Gas is seen in nondilated loops of bowel elsewhere in the visualized upper abdomen. IMPRESSION: Feeding tube in place with tip in the right midabdomen, possibly proximal duodenal. Minimal slack in the stomach Electronically Signed   By: Jill Side M.D.   On: 03/27/2022 14:09   DG CHEST PORT 1 VIEW  Result Date: 03/25/2022 CLINICAL DATA:  Orogastric tube placement EXAM: PORTABLE CHEST 1 VIEW COMPARISON:  Portable exam 1736 hours compared to 03/25/2022 FINDINGS: Orogastric tube coiled in proximal stomach. Endotracheal tube unchanged. Normal heart size, mediastinal contours, and pulmonary vascularity. Atherosclerotic calcification aorta. Lungs clear. No pleural effusion or pneumothorax.  IMPRESSION: Orogastric tube coiled in proximal stomach. Aortic Atherosclerosis (ICD10-I70.0). Electronically Signed   By: Lavonia Dana M.D.   On: 03/25/2022 18:02   DG Chest Port 1 View  Result Date: 03/25/2022 CLINICAL DATA:  [id number] with ventilator dependent respiratory failure. EXAM: PORTABLE CHEST 1 VIEW COMPARISON:  Portable chest yesterday at 4:30 p.m. FINDINGS: 5:05 a.m. ETT tip is 3.4 cm from the carina. NGT tip curves to the left with tip in the proximal stomach. The cardiac size is normal. There is overlying monitor wiring. The aorta is tortuous with moderate calcification, stable mediastinum. No pleural effusion is seen. The lungs are hypoinflated but radiographically clear, as visualized. Thoracic cage is intact. IMPRESSION: 1. ETT tip 3.4 cm from the carina. 2. NGT tip in the proximal stomach. 3. No interval change. Hypoinflated with no appreciable acute findings. Electronically Signed   By: Telford Nab M.D.   On: 03/25/2022 06:54   MR BRAIN WO CONTRAST  Result Date: 03/25/2022 CLINICAL DATA:  Follow-up examination for stroke. EXAM: MRI HEAD WITHOUT CONTRAST TECHNIQUE: Multiplanar, multiecho pulse sequences of the brain and surrounding structures were obtained without intravenous  contrast. COMPARISON:  Prior studies from 03/24/2022. FINDINGS: Brain: Cerebral volume within normal limits for age. Scattered patchy T2/FLAIR hyperintensity involving the periventricular and deep white matter both cerebral hemispheres, consistent with chronic small vessel ischemic disease, mild for age. Confluent restricted diffusion involving the left temporal occipital region, consistent with a large evolving left PCA distribution infarct. Involvement of the left thalamus and thalamocapsular region. No significant mass effect. Associated mild petechial blood products noted at the left occipital lobe (series 12, image 25) Heidelberg classification 1a: HI1, scattered small petechiae, no mass effect. No other evidence  for acute or subacute ischemia. Gray-white matter differentiation maintained. No other areas of chronic cortical infarction. No other acute or chronic intracranial blood products. No mass lesion, midline shift or mass effect. Mild ventricular prominence related global parenchymal volume loss without hydrocephalus. No extra-axial fluid collection. Pituitary gland and suprasellar region within normal limits. Vascular: Major intracranial vascular flow voids are maintained. Skull and upper cervical spine: Craniocervical junction within normal limits. Degenerative spondylosis noted within the upper cervical spine at C3-4 without high-grade spinal stenosis. Bone marrow signal intensity overall within normal limits. No scalp soft tissue abnormality. Sinuses/Orbits: Prior bilateral ocular lens replacement. Scattered mucosal thickening noted throughout the paranasal sinuses. No air-fluid levels to suggest acute sinusitis. No mastoid effusion. Other: None. IMPRESSION: 1. Large acute left PCA distribution infarct. Associated mild petechial blood products at the left occipital lobe without significant mass effect. Heidelberg classification 1a: HI1, scattered small petechiae, no mass effect. 2. No other acute intracranial abnormality. 3. Underlying mild chronic microvascular ischemic disease. Electronically Signed   By: Jeannine Boga M.D.   On: 03/25/2022 03:39   DG CHEST PORT 1 VIEW  Result Date: 03/24/2022 CLINICAL DATA:  Endotracheal tube placement EXAM: PORTABLE CHEST 1 VIEW COMPARISON:  08/25/2010 FINDINGS: Tip of endotracheal tube is 2 cm above the carina. NG tube is noted traversing the esophagus with its tip in the stomach. Side-port in NG tube is not clearly visualized. Cardiac size is within normal limits. There are linear densities in left lower lung field. There are no signs of alveolar pulmonary edema or focal pulmonary consolidation. There is no pleural effusion or pneumothorax. IMPRESSION: Tip of  endotracheal tube is 2 cm above the carina. It could be pulled back 1-2 cm. Small linear densities in left lower lung field may suggest minimal scarring or subsegmental atelectasis. There are no signs of pulmonary edema or focal pulmonary consolidation. Electronically Signed   By: Elmer Picker M.D.   On: 03/24/2022 16:50   DG Abd Portable 1V  Result Date: 03/24/2022 CLINICAL DATA:  Orogastric tube placement. EXAM: PORTABLE ABDOMEN - 1 VIEW COMPARISON:  None Available. FINDINGS: Distal tip of enteric tube is seen in expected position of proximal stomach. No abnormal bowel dilatation. IMPRESSION: Distal tip of enteric tube is seen in expected position of proximal stomach. Electronically Signed   By: Marijo Conception M.D.   On: 03/24/2022 16:33   CT ANGIO HEAD NECK W WO CM  Result Date: 03/24/2022 CLINICAL DATA:  Stroke code, right-sided weakness EXAM: CT ANGIOGRAPHY HEAD AND NECK TECHNIQUE: Multidetector CT imaging of the head and neck was performed using the standard protocol during bolus administration of intravenous contrast. Multiplanar CT image reconstructions and MIPs were obtained to evaluate the vascular anatomy. Carotid stenosis measurements (when applicable) are obtained utilizing NASCET criteria, using the distal internal carotid diameter as the denominator. RADIATION DOSE REDUCTION: This exam was performed according to the departmental dose-optimization program which includes  automated exposure control, adjustment of the mA and/or kV according to patient size and/or use of iterative reconstruction technique. CONTRAST:  8mL OMNIPAQUE IOHEXOL 350 MG/ML SOLN COMPARISON:  No prior CTA available, correlation is made with CT head 03/24/2022 FINDINGS: CT HEAD FINDINGS For noncontrast findings, please see same day CT head. CTA NECK FINDINGS Aortic arch: Two-vessel arch with a common origin of the brachiocephalic and left common carotid arteries. Imaged portion shows no evidence of aneurysm or  dissection. No significant stenosis of the major arch vessel origins. Aortic atherosclerosis. Right carotid system: No evidence of dissection, occlusion, or hemodynamically significant stenosis (greater than 50%). Left carotid system: No evidence of dissection, occlusion, or hemodynamically significant stenosis (greater than 50%). Medially directed outpouching from the mid left ICA (series 7, image 159 and series 8, image 112), possibly a pseudoaneurysm Vertebral arteries: Mild stenosis of the origin of the bilateral vertebral arteries. The vertebral arteries are otherwise patent to the skull base, without significant stenosis, dissection, or occlusion. Skeleton: No acute osseous abnormality. Severe degenerative changes in the cervical spine, with severe disc height loss and partial fusion across C3-C4 and C5-C7. Edentulous. Other neck: Negative. Upper chest: No focal pulmonary opacity or pleural effusion. Review of the MIP images confirms the above findings CTA HEAD FINDINGS Anterior circulation: Both internal carotid arteries are patent to the termini, without significant stenosis. A1 segments patent. Normal anterior communicating artery. Anterior cerebral arteries are patent to their distal aspects. No M1 stenosis or occlusion. MCA branches perfused and symmetric. Posterior circulation: Vertebral arteries patent to the vertebrobasilar junction without stenosis. Suspect a small fenestration in the proximal basilar artery (series 7, image 118). Basilar patent to its distal aspect. Mild dilatation of the basilar about the origin of the bilateral superior cerebellar arteries. 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 The right superior cerebellar artery is patent proximally. Occlusion of the left PCA just distal to the left P1 takeoff (series 7, image 80 7-88), without evidence of reconstitution. Patent right P1. Severe stenosis proximal and  mid right P2 (series 7, image 96). The remainder of the right PCA is patent to its distal aspects PCAs perfused to their distal aspects without stenosis. The bilateral posterior communicating arteries are not visualized. Venous sinuses: As permitted by contrast timing, patent. Anatomic variants: None significant. Review of the MIP images confirms the above findings IMPRESSION: 1. Occlusion of the left PCA just distal to the left P1 takeoff, without evidence of reconstitution. 2. 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 3. Severe stenosis in the proximal and mid right P2. 4. No hemodynamically significant stenosis in the neck. 5. Medially directed outpouching from the mid left ICA, possibly a pseudoaneurysm. 6. Aortic atherosclerosis. Aortic Atherosclerosis (ICD10-I70.0). Impression #1 was communicated on 03/24/2022 at 12:58 pm to provider Dr. Rory Percy via secure text paging. Electronically Signed   By: Merilyn Baba M.D.   On: 03/24/2022 13:21   CT HEAD CODE STROKE WO CONTRAST  Result Date: 03/24/2022 CLINICAL DATA:  Code stroke. Neuro deficit, acute, stroke suspected. Right-sided weakness. EXAM: CT HEAD WITHOUT CONTRAST TECHNIQUE: Contiguous axial images were obtained from the base of the skull through the vertex without intravenous contrast. RADIATION DOSE REDUCTION: This exam was performed according to the departmental dose-optimization program which includes automated exposure control, adjustment of the mA and/or kV according to patient size and/or use of iterative reconstruction technique. COMPARISON:  Head CT 08/24/2010. FINDINGS:  Brain: Motion degraded study. No acute intracranial hemorrhage. Moderate chronic small-vessel disease. Cortical gray-white differentiation is preserved. No hydrocephalus or extra-axial collection. No mass effect or midline shift. Vascular: No hyperdense vessel or unexpected calcification. Skull: No calvarial fracture or suspicious bone lesion. Skull base  is unremarkable. Sinuses/Orbits: Unremarkable. Other: None. ASPECTS [facility] Stroke Program Early CT Score) - Ganglionic level infarction (caudate, lentiform nuclei, internal capsule, insula, M1-M3 cortex): 7 - Supraganglionic infarction (M4-M6 cortex): 3 Total score (0-10 with 10 being normal): 10 IMPRESSION: No acute intracranial hemorrhage.  ASPECT score is 10. Code stroke imaging results were communicated on 03/24/2022 at 12:45 pm to provider Dr. Rory Percy via secure text paging. Electronically Signed   By: Emmit Alexanders M.D.   On: 03/24/2022 12:45    Microbiology: Results for orders placed or performed during the hospital encounter of 03/24/22  Resp panel by RT-PCR (RSV, Flu A&B, Covid) Anterior Nasal Swab     Status: None   Collection Time: 03/24/22  1:10 PM   Specimen: Anterior Nasal Swab  Result Value Ref Range Status   SARS Coronavirus 2 by RT PCR NEGATIVE NEGATIVE Final   Influenza A by PCR NEGATIVE NEGATIVE Final   Influenza B by PCR NEGATIVE NEGATIVE Final    Comment: (NOTE) The Xpert Xpress SARS-CoV-2/FLU/RSV plus assay is intended as an aid in the diagnosis of influenza from Nasopharyngeal swab specimens and should not be used as a sole basis for treatment. Nasal washings and aspirates are unacceptable for Xpert Xpress SARS-CoV-2/FLU/RSV testing.  Fact Sheet for Patients: EntrepreneurPulse.com.au  Fact Sheet for Healthcare Providers: IncredibleEmployment.be  This test is not yet approved or cleared by the [place] FDA and has been authorized for detection and/or diagnosis of SARS-CoV-2 by FDA under an Emergency Use Authorization (EUA). This EUA will remain in effect (meaning this test can be used) for the duration of the COVID-19 declaration under Section 564(b)(1) of the Act, 21 U.S.C. section 360bbb-3(b)(1), unless the authorization is terminated or revoked.     Resp Syncytial Virus by PCR NEGATIVE NEGATIVE Final    Comment:  (NOTE) Fact Sheet for Patients: EntrepreneurPulse.com.au  Fact Sheet for Healthcare Providers: IncredibleEmployment.be  This test is not yet approved or cleared by the [place] FDA and has been authorized for detection and/or diagnosis of SARS-CoV-2 by FDA under an Emergency Use Authorization (EUA). This EUA will remain in effect (meaning this test can be used) for the duration of the COVID-19 declaration under Section 564(b)(1) of the Act, 21 U.S.C. section 360bbb-3(b)(1), unless the authorization is terminated or revoked.  Performed at [facility], [place] [address] [place], [place] [address]   MRSA Next Gen by PCR, Nasal     Status: None   Collection Time: 03/24/22  5:26 PM   Specimen: Nasal Mucosa; Nasal Swab  Result Value Ref Range Status   MRSA by PCR Next Gen NOT DETECTED NOT DETECTED Final    Comment: (NOTE) The GeneXpert MRSA Assay (FDA approved for NASAL specimens only), is one component of a comprehensive MRSA colonization surveillance program. It is not intended to diagnose MRSA infection nor to guide or monitor treatment for MRSA infections. Test performance is not FDA approved in patients less than 15 years old. Performed at [facility], [place] [address], [place], [place] [address]   Culture, blood (Routine X 2) w Reflex to ID Panel     Status: None (Preliminary result)   Collection Time: 03/31/22 12:54 PM   Specimen: BLOOD  Result  Value Ref Range Status   Specimen Description BLOOD LEFT ANTECUBITAL  Final   Special Requests   Final    BOTTLES DRAWN AEROBIC AND ANAEROBIC Blood Culture adequate volume   Culture  Setup Time   Final    ANAEROBIC BOTTLE ONLY GRAM POSITIVE COCCI IN CLUSTERS Organism ID to follow CRITICAL RESULT CALLED TO, READ BACK BY AND VERIFIED WITH:  C/ PHARMD C. PIERCE 04/01/22 1316 A. LAFRANCE Performed at [facility], [place] [address] [place], [place] [address]    Culture GRAM  POSITIVE COCCI  Final   Report Status PENDING  Incomplete  Blood Culture ID Panel (Reflexed)     Status: Abnormal   Collection Time: 03/31/22 12:54 PM  Result Value Ref Range Status   Enterococcus faecalis NOT DETECTED NOT DETECTED Final   Enterococcus Faecium NOT DETECTED NOT DETECTED Final   Listeria monocytogenes NOT DETECTED NOT DETECTED Final   Staphylococcus species DETECTED (A) NOT DETECTED Final    Comment: CRITICAL RESULT CALLED TO, READ BACK BY AND VERIFIED WITH:  C/ PHARMD C. PIERCE 04/01/22 1316 A. LAFRANCE    Staphylococcus aureus (BCID) NOT DETECTED NOT DETECTED Final   Staphylococcus epidermidis DETECTED (A) NOT DETECTED Final    Comment: Methicillin (oxacillin) resistant coagulase negative staphylococcus. Possible blood culture contaminant (unless isolated from more than one blood culture draw or clinical case suggests pathogenicity). No antibiotic treatment is indicated for blood  culture contaminants. CRITICAL RESULT CALLED TO, READ BACK BY AND VERIFIED WITH:  C/ PHARMD C. PIERCE 04/01/22 1316 A. LAFRANCE    Staphylococcus lugdunensis NOT DETECTED NOT DETECTED Final   Streptococcus species NOT DETECTED NOT DETECTED Final   Streptococcus agalactiae NOT DETECTED NOT DETECTED Final   Streptococcus pneumoniae NOT DETECTED NOT DETECTED Final   Streptococcus pyogenes NOT DETECTED NOT DETECTED Final   A.calcoaceticus-baumannii NOT DETECTED NOT DETECTED Final   Bacteroides fragilis NOT DETECTED NOT DETECTED Final   Enterobacterales NOT DETECTED NOT DETECTED Final   Enterobacter cloacae complex NOT DETECTED NOT DETECTED Final   Escherichia coli NOT DETECTED NOT DETECTED Final   Klebsiella aerogenes NOT DETECTED NOT DETECTED Final   Klebsiella oxytoca NOT DETECTED NOT DETECTED Final   Klebsiella pneumoniae NOT DETECTED NOT DETECTED Final   Proteus species NOT DETECTED NOT DETECTED Final   Salmonella species NOT DETECTED NOT DETECTED Final   Serratia marcescens NOT DETECTED  NOT DETECTED Final   Haemophilus influenzae NOT DETECTED NOT DETECTED Final   Neisseria meningitidis NOT DETECTED NOT DETECTED Final   Pseudomonas aeruginosa NOT DETECTED NOT DETECTED Final   Stenotrophomonas maltophilia NOT DETECTED NOT DETECTED Final   Candida albicans NOT DETECTED NOT DETECTED Final   Candida auris NOT DETECTED NOT DETECTED Final   Candida glabrata NOT DETECTED NOT DETECTED Final   Candida krusei NOT DETECTED NOT DETECTED Final   Candida parapsilosis NOT DETECTED NOT DETECTED Final   Candida tropicalis NOT DETECTED NOT DETECTED Final   Cryptococcus neoformans/gattii NOT DETECTED NOT DETECTED Final   Methicillin resistance mecA/C DETECTED (A) NOT DETECTED Final    Comment: CRITICAL RESULT CALLED TO, READ BACK BY AND VERIFIED WITH:  C/ PHARMD C. PIERCE 04/01/22 1316 A. LAFRANCE Performed at [facility], [place] [address], [place], [place] [address]   Culture, blood (Routine X 2) w Reflex to ID Panel     Status: None (Preliminary result)   Collection Time: 03/31/22 12:56 PM   Specimen: BLOOD  Result Value Ref Range Status   Specimen Description BLOOD BLOOD RIGHT  HAND  Final   Special Requests   Final    BOTTLES DRAWN AEROBIC AND ANAEROBIC Blood Culture adequate volume   Culture   Final    NO GROWTH < 24 HOURS Performed at [facility], [address] [place], [place] [address]    Report Status PENDING  Incomplete    Labs: CBC: Recent Labs  Lab 03/28/22 1203 03/29/22 0408 03/30/22 0644 03/31/22 0727 04/01/22 1026  WBC 7.8 9.4 11.9* 17.6* 18.7*  NEUTROABS  --   --   --  13.9* 15.6*  HGB 16.1 16.8 17.4* 17.1* 15.6  HCT 46.9 46.6 52.8* 52.4* 45.4  MCV 93.6 91.9 96.2 98.3 95.6  PLT 215 215 231 [id number]   Basic Metabolic Panel: Recent Labs  Lab 03/27/22 0556 03/28/22 0728 03/29/22 0408 03/30/22 0644 03/31/22 0727 04/01/22 1026  NA 142 146* 149* 153* 152* 145  K 4.0 3.4* 3.4* 4.4 4.7 4.1  CL 108 105 110 117* 117* 113*  CO2 24 26 28 24  21*  20*  GLUCOSE 161* 149* 231* 343* 290* 314*  BUN 22 35* 49* 59* 91* 110*  CREATININE 1.69* 1.78* 1.99* 2.25* 3.96* 3.96*  CALCIUM 8.8* 9.7 9.4 9.3 8.8* 7.8*  MG 2.3 2.7* 2.6* 2.9* 3.0* 3.0*  PHOS 3.3 2.5 3.1 2.7 4.0  --    Liver Function Tests: Recent Labs  Lab 04/01/22 1026  AST 26  ALT 19  ALKPHOS 66  BILITOT 0.7  PROT 5.5*  ALBUMIN 2.1*   CBG: Recent Labs  Lab 03/31/22 2037 04/01/22 0006 04/01/22 0452 04/01/22 0822 04/01/22 1157  GLUCAP 262* 312* 253* 281* 290*    Discharge time spent: less than 30 minutes.  Signed: Annita Brod, MD [facility] 04/01/2022

## 2022-04-01 NOTE — Progress Notes (Signed)
Patient handed off to transport. Bedside report given. Patient leaving with PIV intact per hospice recs. GCS prior to departure was E1, V3, M4. Vital sign within normal limits.

## 2022-04-01 NOTE — Progress Notes (Signed)
Palliative Medicine Progress Note   Patient Name: Shane Jones       Date: 04/01/2022 DOB: 1945/09/26  Age: 77 y.o. MRN#: [id number] Attending Physician: Stroke, Md, MD Primary Care Physician: Shane Luis, MD Admit Date: 03/24/2022    HPI/Patient Profile: 77 y.o. male  with past medical history of uncontrolled diabetes, sleep apnea, and hypertension.  He had not been feeling well for the past few days and was seen by his PCP on 03/24/2022 for management of high blood sugar.  In the office, he was noted to have a sudden onset of bradycardia, unresponsiveness, and vomiting.  EMS was called and they noted he had a leftward gaze as well as right hemiplegia.  He was brought into the ED as a code stroke.  Found to have a large acute left PCA infarct.   Palliative Medicine was consulted for goals of care.   Subjective: Chart reviewed. Creatinine remains elevated at 3.96 today.  I assessed patient at bedside. He does not follow commands or respond to sternal rub.   I met in the 3W waiting room with son/Shane Jones and daughter/Shane Jones. Reviewed patient's current clinical condition, emphasizing that neurologic status remains poor.  After having time to process the situation since our meeting yesterday, family has made the difficult decision to transition to comfort care. They also request that patient be transferred to the hospice facility in [place], which is closer for family to visit.   Reviewed that comfort care means stopping full scope medical interventions, allowing a natural course to occur.  Discussed what that would look like--keeping him clean and dry, no labs, no artificial hydration or feeding, no antibiotics, and administering medication to manage symptoms such as pain and dyspnea.    Discussed natural trajectory at end-of-life. Emotional support provided.   Referral made to the hospice facility in [place]. Additional time spent coordinating care and discussing plan of care with attending MD, Shane Jones team, RN, and hospice liaison. There is a bed available today and transfer is pending.     Objective:  Physical Exam Vitals reviewed.  Constitutional:      General: He is not in acute distress.    Appearance: He is ill-appearing.  Pulmonary:     Effort: Tachypnea present. No respiratory distress.  Neurological:     Mental Status: He is unresponsive.  Vital Signs: BP 121/72 (BP Location: Right Arm)   Pulse 65   Temp 100 F (37.8 C) (Axillary)   Resp (!) 35   Ht 5\' 5"  (1.651 m)   Wt 70.5 kg   SpO2 96%   BMI 25.86 kg/m  SpO2: SpO2: 96 % O2 Device: O2 Device: Nasal Cannula O2 Flow Rate: O2 Flow Rate (L/min): 4 L/min    Palliative Medicine Assessment & Plan   Assessment: Principal Problem:   Acute ischemic stroke (HCC) Active Problems:   Hypertension   Diabetes mellitus without complication (HCC)   Hyperlipidemia   Sleep apnea   Dysphagia   AKI (acute kidney injury) (HCC)   Malnutrition of moderate degree (HCC)   Acute metabolic encephalopathy    Recommendations/Plan: Transition to full comfort measures Discontinue IV fluids, antibiotics, tube feeds, and cardiac monitoring Remove feeding tube Transfer to hospice facility in [place] - there is a bed available today  Symptom Management:  Dilaudid prn for pain or dyspnea Lorazepam (ATIVAN) prn for anxiety Haloperidol (HALDOL) prn for agitation  Glycopyrrolate (ROBINUL) for excessive secretions Ondansetron (ZOFRAN) prn for nausea Polyvinyl alcohol (LIQUIFILM TEARS) prn for dry eyes Antiseptic oral rinse (BIOTENE) prn for dry mouth   Code Status: DNR/DNI - gold form signed and placed on chart   Prognosis:  < 2 weeks  Discharge Planning: Hospice facility    Thank you  for allowing the Palliative Medicine Team to assist in the care of this patient.   MDM - High   Shane Proud, NP   Please contact Palliative Medicine Team phone at [phone] for questions and concerns.  For individual providers, please see AMION.

## 2022-04-01 NOTE — TOC Transition Note (Signed)
Transition of Care [facility]) - CM/SW Discharge Note   Patient Details  Name: Shane Jones MRN: [id number] Date of Birth: 1945/11/19  Transition of Care [facility] CM/SW Contact:  Amador Cunas, [place] Phone Number: 04/01/2022, 3:32 PM   Clinical Narrative: Pt for dc to [place] today. Pt's family aware and have completed admission paperwork per [facility] with hospice. RN provided with number for report and Misty to arrange transport. SW signing off at dc.   Wandra Feinstein, MSW, LCSW [phone] (coverage)      Final next level of care: [place] Barriers to Discharge: No Barriers Identified   Patient Goals and CMS Choice CMS Medicare.gov Compare Post Acute Care list provided to:: Patient Choice offered to / list presented to : Patient, Adult Children (Son and daughter)  Discharge Placement                  Patient to be transferred to facility by: [place] Name of family member notified: Pete/son Patient and family notified of of transfer: 04/01/22  Discharge Plan and Services Additional resources added to the After Visit Summary for     Discharge Planning Services: CM Consult Post Acute Care Choice: IP Rehab                               Social Determinants of Health (SDOH) Interventions SDOH Screenings   Food Insecurity: No Food Insecurity (05/23/2021)  Housing: Medium Risk (05/23/2021)  Transportation Needs: No Transportation Needs (05/23/2021)  Depression (PHQ2-9): Low Risk  (03/24/2022)  Financial Resource Strain: High Risk (12/22/2021)  Physical Activity: Sufficiently Active (02/10/2020)  Social Connections: Socially Isolated (02/10/2020)  Stress: No Stress Concern Present (05/23/2021)  Tobacco Use: Medium Risk (03/28/2022)     Readmission Risk Interventions    03/27/2022    2:58 PM  Readmission Risk Prevention Plan  Transportation Screening Complete  PCP or Specialist Appt within 5-7 Days Complete  Home Care Screening  Complete  Medication Review (RN CM) Referral to Pharmacy

## 2022-04-01 NOTE — Hospital Course (Addendum)
Patient is a 77 year old male with past medical history of uncontrolled diabetes, stage IIIb CKD, sleep apnea and hypertension who had not been feeling well for several days and was seen by his PCP on 3/22 for worsening symptoms.  While in the office, patient was noted to have sudden onset bradycardia, unresponsiveness and vomiting.  EMS called and patient noted to have a leftward gaze as well as right hemiplegia and was brought to the emergency department as a code stroke and patient found to have a large acute left PCA infarct.  Patient not felt to be a tPA candidate because of unclear time of when he was last known well.  Felt to possibly be candidate for thrombectomy.  Admitted to the ICU and intubated.  Following admission, patient became hypotensive requiring pressor support.  Patient taken that same day by interventional radiology and unfortunately, multiple attempts to access left PCA unsuccessful due to significant tortuosity at multiple sites.  Patient able to be extubated by 3/24, however despite being off of sedation for some time, remains unresponsive, at times occasional restless.  Patient evaluated by speech therapy and found to be unable to swallow.  Cortrak tube placed for feeding 3/25.  By 3/27, patient neurological exam but still unchanged with left gaze preference and right hemiplegia.  Palliative care consulted and met with family on 3/28.  Renal function continued to decline over next few days.  Hospitalists consulted with cause being free water deficit, decreased intake and developing organ system failure.  With patient's labs continuing to decline, patient made full comfort measures on 3/30.  Patient being evaluated by hospice facility.

## 2022-04-03 LAB — CULTURE, BLOOD (ROUTINE X 2): Special Requests: ADEQUATE

## 2022-04-04 NOTE — Progress Notes (Signed)
Note entered on 04/04/2022 Date of service for which clarification was sought was 03/24/2022  I was asked to provide clarification on why CT perfusion study was not considered at the time of presentation. Patient's last known well was unclear.  Initially was brought in with a last known well at the doctor's office 11:20 AM but then son reported that he had not been doing well.  Extremely challenging case to get history.  On CT, there were no acute changes.  CTA showed a left P1 occlusion and severe P2 stenosis.  It was unclear again on history how long the symptoms have been going but the occlusion looked acute and there were no corresponding CT changes.  CT perfusion study, since it is not validated for posterior circulation strokes was not performed given that there is not much that we know about the CT perfusion study after 24 to 48 hours. The determination of taking him for intervention was slowly made on the sudden change in the last few hours which might have been due to acute on chronic deterioration of his PCA occlusion, in the setting of no clear CT changes.  This was done after detailed discussions with the family and the interventionalist.  -- Amie Portland, MD Neurologist [facility] Pager: [phone]

## 2022-04-05 LAB — CULTURE, BLOOD (ROUTINE X 2)
Culture: NO GROWTH
Special Requests: ADEQUATE

## 2022-04-07 ENCOUNTER — Telehealth: Payer: Self-pay | Admitting: Family Medicine

## 2022-04-07 NOTE — Telephone Encounter (Signed)
Patient passed away on 05/01/2022 at home on hospice. Please change his status to deceased.

## 2022-04-28 ENCOUNTER — Ambulatory Visit: Payer: PPO | Admitting: Family Medicine

## 2022-05-03 DEATH — deceased

## 2023-11-20 NOTE — Telephone Encounter (Signed)
 open in error
